# Patient Record
Sex: Female | Born: 1965 | Race: Black or African American | Hispanic: No | Marital: Single | Smoking: Never smoker
Health system: Southern US, Community
[De-identification: ages and names within clinical notes are randomized; demographics above are authoritative.]

## PROBLEM LIST (undated history)

## (undated) ENCOUNTER — Inpatient Hospital Stay (HOSPITAL_COMMUNITY): Payer: 59

## (undated) DIAGNOSIS — D649 Anemia, unspecified: Secondary | ICD-10-CM

## (undated) DIAGNOSIS — R519 Headache, unspecified: Secondary | ICD-10-CM

## (undated) DIAGNOSIS — F419 Anxiety disorder, unspecified: Secondary | ICD-10-CM

## (undated) DIAGNOSIS — K219 Gastro-esophageal reflux disease without esophagitis: Secondary | ICD-10-CM

## (undated) DIAGNOSIS — F32A Depression, unspecified: Secondary | ICD-10-CM

## (undated) DIAGNOSIS — R51 Headache: Secondary | ICD-10-CM

## (undated) DIAGNOSIS — I10 Essential (primary) hypertension: Secondary | ICD-10-CM

## (undated) HISTORY — DX: Essential (primary) hypertension: I10

## (undated) HISTORY — DX: Headache, unspecified: R51.9

## (undated) HISTORY — DX: Headache: R51

## (undated) HISTORY — PX: LAPAROSCOPIC NEPHRECTOMY: SUR781

## (undated) HISTORY — DX: Anxiety disorder, unspecified: F41.9

## (undated) HISTORY — DX: Anemia, unspecified: D64.9

## (undated) HISTORY — DX: Depression, unspecified: F32.A

## (undated) HISTORY — PX: ABDOMINAL HYSTERECTOMY: SHX81

---

## 1998-04-05 ENCOUNTER — Ambulatory Visit (HOSPITAL_COMMUNITY): Admission: RE | Admit: 1998-04-05 | Discharge: 1998-04-05 | Payer: Self-pay | Admitting: General Surgery

## 1998-08-28 ENCOUNTER — Ambulatory Visit (HOSPITAL_COMMUNITY): Admission: RE | Admit: 1998-08-28 | Discharge: 1998-08-28 | Payer: Self-pay | Admitting: General Surgery

## 2002-06-21 ENCOUNTER — Encounter (INDEPENDENT_AMBULATORY_CARE_PROVIDER_SITE_OTHER): Payer: Self-pay | Admitting: Specialist

## 2002-06-21 ENCOUNTER — Ambulatory Visit (HOSPITAL_BASED_OUTPATIENT_CLINIC_OR_DEPARTMENT_OTHER): Admission: RE | Admit: 2002-06-21 | Discharge: 2002-06-21 | Payer: Self-pay | Admitting: Plastic Surgery

## 2003-11-19 HISTORY — PX: BREAST REDUCTION SURGERY: SHX8

## 2005-11-18 HISTORY — PX: GASTRIC BYPASS: SHX52

## 2006-09-23 ENCOUNTER — Ambulatory Visit: Payer: Self-pay | Admitting: Family Medicine

## 2006-09-23 ENCOUNTER — Other Ambulatory Visit: Admission: RE | Admit: 2006-09-23 | Discharge: 2006-09-23 | Payer: Self-pay | Admitting: Family Medicine

## 2006-09-23 ENCOUNTER — Encounter (INDEPENDENT_AMBULATORY_CARE_PROVIDER_SITE_OTHER): Payer: Self-pay | Admitting: *Deleted

## 2007-01-22 ENCOUNTER — Ambulatory Visit: Payer: Self-pay | Admitting: Family Medicine

## 2007-01-23 ENCOUNTER — Encounter: Payer: Self-pay | Admitting: Family Medicine

## 2007-01-23 LAB — CONVERTED CEMR LAB: Candida species: NEGATIVE

## 2007-02-23 ENCOUNTER — Ambulatory Visit: Payer: Self-pay | Admitting: Family Medicine

## 2007-04-20 ENCOUNTER — Ambulatory Visit: Payer: Self-pay | Admitting: Family Medicine

## 2007-06-01 ENCOUNTER — Ambulatory Visit: Payer: Self-pay | Admitting: Family Medicine

## 2007-11-17 ENCOUNTER — Encounter: Payer: Self-pay | Admitting: Family Medicine

## 2007-11-17 DIAGNOSIS — I10 Essential (primary) hypertension: Secondary | ICD-10-CM | POA: Insufficient documentation

## 2007-11-17 DIAGNOSIS — E049 Nontoxic goiter, unspecified: Secondary | ICD-10-CM | POA: Insufficient documentation

## 2008-05-11 ENCOUNTER — Ambulatory Visit: Payer: Self-pay | Admitting: Family Medicine

## 2008-08-18 ENCOUNTER — Emergency Department (HOSPITAL_COMMUNITY): Admission: EM | Admit: 2008-08-18 | Discharge: 2008-08-19 | Payer: Self-pay | Admitting: Emergency Medicine

## 2008-12-28 ENCOUNTER — Ambulatory Visit: Payer: Self-pay | Admitting: Family Medicine

## 2008-12-28 ENCOUNTER — Encounter: Payer: Self-pay | Admitting: Family Medicine

## 2008-12-28 ENCOUNTER — Other Ambulatory Visit: Admission: RE | Admit: 2008-12-28 | Discharge: 2008-12-28 | Payer: Self-pay | Admitting: Family Medicine

## 2008-12-28 DIAGNOSIS — G4700 Insomnia, unspecified: Secondary | ICD-10-CM

## 2008-12-29 ENCOUNTER — Encounter: Payer: Self-pay | Admitting: Family Medicine

## 2008-12-29 LAB — CONVERTED CEMR LAB
BUN: 7 mg/dL (ref 6–23)
Basophils Relative: 0 % (ref 0–1)
Chloride: 103 meq/L (ref 96–112)
Creatinine, Ser: 0.67 mg/dL (ref 0.40–1.20)
Eosinophils Absolute: 0.1 10*3/uL (ref 0.0–0.7)
Eosinophils Relative: 1 % (ref 0–5)
GC Probe Amp, Genital: NEGATIVE
HCT: 34.5 % — ABNORMAL LOW (ref 36.0–46.0)
Hemoglobin: 10.1 g/dL — ABNORMAL LOW (ref 12.0–15.0)
LDL Cholesterol: 99 mg/dL (ref 0–99)
MCHC: 29.3 g/dL — ABNORMAL LOW (ref 30.0–36.0)
MCV: 85.6 fL (ref 78.0–100.0)
Monocytes Absolute: 0.4 10*3/uL (ref 0.1–1.0)
Monocytes Relative: 7 % (ref 3–12)
RBC: 4.03 M/uL (ref 3.87–5.11)
Triglycerides: 48 mg/dL (ref <150)

## 2009-01-02 LAB — CONVERTED CEMR LAB: Candida species: NEGATIVE

## 2009-01-04 LAB — CONVERTED CEMR LAB: Retic Ct Pct: 0.9 % (ref 0.4–3.1)

## 2009-01-09 ENCOUNTER — Telehealth: Payer: Self-pay | Admitting: Family Medicine

## 2009-01-23 ENCOUNTER — Encounter: Payer: Self-pay | Admitting: Family Medicine

## 2009-02-08 ENCOUNTER — Ambulatory Visit: Payer: Self-pay | Admitting: Family Medicine

## 2009-02-09 DIAGNOSIS — D508 Other iron deficiency anemias: Secondary | ICD-10-CM | POA: Insufficient documentation

## 2009-03-16 ENCOUNTER — Emergency Department (HOSPITAL_BASED_OUTPATIENT_CLINIC_OR_DEPARTMENT_OTHER): Admission: EM | Admit: 2009-03-16 | Discharge: 2009-03-16 | Payer: Self-pay | Admitting: Emergency Medicine

## 2009-03-16 ENCOUNTER — Ambulatory Visit: Payer: Self-pay | Admitting: Family Medicine

## 2009-03-16 DIAGNOSIS — J018 Other acute sinusitis: Secondary | ICD-10-CM

## 2009-03-23 DIAGNOSIS — J029 Acute pharyngitis, unspecified: Secondary | ICD-10-CM | POA: Insufficient documentation

## 2009-03-30 ENCOUNTER — Telehealth: Payer: Self-pay | Admitting: Family Medicine

## 2009-04-05 ENCOUNTER — Encounter: Payer: Self-pay | Admitting: Family Medicine

## 2009-04-05 ENCOUNTER — Telehealth: Payer: Self-pay | Admitting: Family Medicine

## 2009-04-26 ENCOUNTER — Encounter (INDEPENDENT_AMBULATORY_CARE_PROVIDER_SITE_OTHER): Payer: Self-pay | Admitting: Obstetrics and Gynecology

## 2009-04-26 ENCOUNTER — Ambulatory Visit (HOSPITAL_COMMUNITY): Admission: RE | Admit: 2009-04-26 | Discharge: 2009-04-27 | Payer: Self-pay | Admitting: Obstetrics and Gynecology

## 2009-04-27 ENCOUNTER — Encounter (INDEPENDENT_AMBULATORY_CARE_PROVIDER_SITE_OTHER): Payer: Self-pay | Admitting: Obstetrics and Gynecology

## 2009-04-27 ENCOUNTER — Ambulatory Visit: Payer: Self-pay | Admitting: Vascular Surgery

## 2009-05-02 ENCOUNTER — Encounter: Payer: Self-pay | Admitting: Family Medicine

## 2009-07-10 ENCOUNTER — Ambulatory Visit: Payer: Self-pay | Admitting: Family Medicine

## 2009-07-10 DIAGNOSIS — M25569 Pain in unspecified knee: Secondary | ICD-10-CM

## 2009-07-21 ENCOUNTER — Telehealth: Payer: Self-pay | Admitting: Family Medicine

## 2009-08-11 ENCOUNTER — Telehealth: Payer: Self-pay | Admitting: Family Medicine

## 2009-08-18 ENCOUNTER — Telehealth: Payer: Self-pay | Admitting: Family Medicine

## 2009-08-21 ENCOUNTER — Encounter: Payer: Self-pay | Admitting: Family Medicine

## 2009-08-22 ENCOUNTER — Telehealth: Payer: Self-pay | Admitting: Family Medicine

## 2009-08-24 ENCOUNTER — Ambulatory Visit: Payer: Self-pay | Admitting: Family Medicine

## 2009-08-25 ENCOUNTER — Encounter: Payer: Self-pay | Admitting: Family Medicine

## 2009-08-28 ENCOUNTER — Encounter: Payer: Self-pay | Admitting: Family Medicine

## 2009-08-28 DIAGNOSIS — H109 Unspecified conjunctivitis: Secondary | ICD-10-CM | POA: Insufficient documentation

## 2009-08-28 DIAGNOSIS — K3189 Other diseases of stomach and duodenum: Secondary | ICD-10-CM | POA: Insufficient documentation

## 2009-08-28 DIAGNOSIS — R1013 Epigastric pain: Secondary | ICD-10-CM

## 2009-09-04 ENCOUNTER — Ambulatory Visit (HOSPITAL_COMMUNITY): Admission: RE | Admit: 2009-09-04 | Discharge: 2009-09-04 | Payer: Self-pay | Admitting: General Surgery

## 2009-09-07 ENCOUNTER — Telehealth: Payer: Self-pay | Admitting: Family Medicine

## 2009-09-13 ENCOUNTER — Ambulatory Visit (HOSPITAL_COMMUNITY): Admission: RE | Admit: 2009-09-13 | Discharge: 2009-09-13 | Payer: Self-pay | Admitting: General Surgery

## 2009-09-13 ENCOUNTER — Encounter: Payer: Self-pay | Admitting: Family Medicine

## 2009-09-13 ENCOUNTER — Encounter: Admission: RE | Admit: 2009-09-13 | Discharge: 2009-11-15 | Payer: Self-pay | Admitting: General Surgery

## 2009-09-26 ENCOUNTER — Telehealth: Payer: Self-pay | Admitting: Family Medicine

## 2009-10-20 ENCOUNTER — Encounter: Payer: Self-pay | Admitting: Family Medicine

## 2009-12-14 ENCOUNTER — Encounter: Admission: RE | Admit: 2009-12-14 | Discharge: 2010-03-14 | Payer: Self-pay | Admitting: General Surgery

## 2010-01-15 ENCOUNTER — Inpatient Hospital Stay (HOSPITAL_COMMUNITY): Admission: RE | Admit: 2010-01-15 | Discharge: 2010-01-17 | Payer: Self-pay | Admitting: Surgery

## 2010-01-16 ENCOUNTER — Encounter (INDEPENDENT_AMBULATORY_CARE_PROVIDER_SITE_OTHER): Payer: Self-pay | Admitting: General Surgery

## 2010-01-16 ENCOUNTER — Ambulatory Visit: Payer: Self-pay | Admitting: Surgery

## 2010-02-02 ENCOUNTER — Encounter: Payer: Self-pay | Admitting: Family Medicine

## 2010-03-08 ENCOUNTER — Encounter: Payer: Self-pay | Admitting: Family Medicine

## 2010-03-20 ENCOUNTER — Encounter: Admission: RE | Admit: 2010-03-20 | Discharge: 2010-03-20 | Payer: Self-pay | Admitting: General Surgery

## 2010-04-02 ENCOUNTER — Telehealth: Payer: Self-pay | Admitting: Family Medicine

## 2010-05-03 ENCOUNTER — Ambulatory Visit: Payer: Self-pay | Admitting: Family Medicine

## 2010-05-03 DIAGNOSIS — Z888 Allergy status to other drugs, medicaments and biological substances status: Secondary | ICD-10-CM | POA: Insufficient documentation

## 2010-05-07 ENCOUNTER — Telehealth: Payer: Self-pay | Admitting: Family Medicine

## 2010-06-18 ENCOUNTER — Telehealth: Payer: Self-pay | Admitting: Family Medicine

## 2010-06-19 ENCOUNTER — Encounter: Admission: RE | Admit: 2010-06-19 | Discharge: 2010-08-16 | Payer: Self-pay | Admitting: General Surgery

## 2010-06-20 ENCOUNTER — Encounter (INDEPENDENT_AMBULATORY_CARE_PROVIDER_SITE_OTHER): Payer: Self-pay

## 2010-06-20 ENCOUNTER — Ambulatory Visit: Payer: Self-pay | Admitting: Family Medicine

## 2010-06-24 DIAGNOSIS — F339 Major depressive disorder, recurrent, unspecified: Secondary | ICD-10-CM

## 2010-06-24 DIAGNOSIS — F411 Generalized anxiety disorder: Secondary | ICD-10-CM

## 2010-06-27 ENCOUNTER — Encounter: Payer: Self-pay | Admitting: Family Medicine

## 2010-06-29 ENCOUNTER — Encounter: Payer: Self-pay | Admitting: Family Medicine

## 2010-07-24 ENCOUNTER — Encounter: Payer: Self-pay | Admitting: Family Medicine

## 2010-07-25 ENCOUNTER — Ambulatory Visit: Payer: Self-pay | Admitting: Family Medicine

## 2010-07-25 DIAGNOSIS — R5383 Other fatigue: Secondary | ICD-10-CM

## 2010-07-25 DIAGNOSIS — R002 Palpitations: Secondary | ICD-10-CM | POA: Insufficient documentation

## 2010-07-25 DIAGNOSIS — D649 Anemia, unspecified: Secondary | ICD-10-CM

## 2010-07-25 DIAGNOSIS — R5381 Other malaise: Secondary | ICD-10-CM

## 2010-07-25 DIAGNOSIS — R0600 Dyspnea, unspecified: Secondary | ICD-10-CM | POA: Insufficient documentation

## 2010-07-25 DIAGNOSIS — R0609 Other forms of dyspnea: Secondary | ICD-10-CM | POA: Insufficient documentation

## 2010-07-25 DIAGNOSIS — R0602 Shortness of breath: Secondary | ICD-10-CM

## 2010-07-25 HISTORY — DX: Anemia, unspecified: D64.9

## 2010-07-26 ENCOUNTER — Encounter: Payer: Self-pay | Admitting: Family Medicine

## 2010-07-26 DIAGNOSIS — R946 Abnormal results of thyroid function studies: Secondary | ICD-10-CM

## 2010-07-26 LAB — CONVERTED CEMR LAB
Free T4: 1.1 ng/dL (ref 0.80–1.80)
Hgb A1c MFr Bld: 5.3 % (ref <5.7)
T3, Free: 2.4 pg/mL (ref 2.3–4.2)

## 2010-07-27 ENCOUNTER — Ambulatory Visit: Payer: Self-pay | Admitting: Family Medicine

## 2010-07-27 DIAGNOSIS — E538 Deficiency of other specified B group vitamins: Secondary | ICD-10-CM | POA: Insufficient documentation

## 2010-07-30 ENCOUNTER — Encounter: Payer: Self-pay | Admitting: Family Medicine

## 2010-07-30 ENCOUNTER — Ambulatory Visit (HOSPITAL_COMMUNITY): Admission: RE | Admit: 2010-07-30 | Discharge: 2010-07-30 | Payer: Self-pay | Admitting: Family Medicine

## 2010-08-06 ENCOUNTER — Ambulatory Visit: Payer: Self-pay | Admitting: Oncology

## 2010-08-07 ENCOUNTER — Encounter (HOSPITAL_COMMUNITY)
Admission: RE | Admit: 2010-08-07 | Discharge: 2010-08-17 | Payer: Self-pay | Source: Home / Self Care | Admitting: Oncology

## 2010-08-07 ENCOUNTER — Ambulatory Visit (HOSPITAL_COMMUNITY): Payer: Self-pay | Admitting: Oncology

## 2010-08-07 ENCOUNTER — Encounter: Payer: Self-pay | Admitting: Family Medicine

## 2010-08-09 ENCOUNTER — Encounter (INDEPENDENT_AMBULATORY_CARE_PROVIDER_SITE_OTHER): Payer: Self-pay | Admitting: *Deleted

## 2010-09-12 ENCOUNTER — Encounter (HOSPITAL_COMMUNITY): Admission: RE | Admit: 2010-09-12 | Payer: Self-pay | Admitting: Oncology

## 2010-12-08 ENCOUNTER — Encounter: Payer: Self-pay | Admitting: Family Medicine

## 2010-12-09 ENCOUNTER — Encounter: Payer: Self-pay | Admitting: Family Medicine

## 2010-12-10 ENCOUNTER — Encounter: Payer: Self-pay | Admitting: Family Medicine

## 2010-12-20 NOTE — Letter (Signed)
Summary: Appointment - Missed  [facility]     [place], [place]    Phone:   Fax:      August 09, 2010 MRN: [id number]   [facility] [address] [place], [place]  [address]   Dear Ms. Pacella,  Our records indicate you missed your appointment on       08/09/10                 with Dr.       .             MCDOWELL                       It is very important that we reach you to reschedule this appointment. We look forward to participating in your health care needs. Please contact us at the number listed above at your earliest convenience to reschedule this appointment.     Sincerely,    Glass blower/designer

## 2010-12-20 NOTE — Progress Notes (Signed)
Summary: DR. Johna Sheriff  DR. HOXWORTH   Imported By: Lind Guest 04/03/2010 09:55:25  _____________________________________________________________________  External Attachment:    Type:   Image     Comment:   External Document

## 2010-12-20 NOTE — Miscellaneous (Signed)
  Clinical Lists Changes  Orders: Added new Referral order of Hematology Referral (Hematology) - Signed 

## 2010-12-20 NOTE — Assessment & Plan Note (Signed)
Summary: b12 shot  Nurse Visit   Vital Signs:  Patient profile:   45 year old female Menstrual status:  regular Height:      66 inches Weight:      246.25 pounds BP sitting:   148 / 98  (right arm)  Vitals Entered By: Adella Hare LPN (July 27, 2010 9:23 AM)  Allergies: 1)  ! Toradol  Medication Administration  Injection # 1:    Medication: Vit B12 1000 mcg    Diagnosis: B12 DEFICIENCY (ICD-266.2)    Route: IM    Site: RUOQ gluteus    Exp Date: 5/12    Lot #: [id number]    Mfr: [employer] Regent    Patient tolerated injection without complications    Given by: Adella Hare LPN (July 27, 2010 9:33 AM)  Orders Added: 1)  Vit B12 1000 mcg [J3420] 2)  Admin of Therapeutic Inj  intramuscular or subcutaneous [96372]   Medication Administration  Injection # 1:    Medication: Vit B12 1000 mcg    Diagnosis: B12 DEFICIENCY (ICD-266.2)    Route: IM    Site: RUOQ gluteus    Exp Date: 5/12    Lot #: [id number]    Mfr: American Regent    Patient tolerated injection without complications    Given by: Adella Hare LPN (July 27, 2010 9:33 AM)  Orders Added: 1)  Vit B12 1000 mcg [J3420] 2)  Admin of Therapeutic Inj  intramuscular or subcutaneous [96372] Pt is deficient in vit b and is symptomatic, she will rcive monthly vit B 12 injections

## 2010-12-20 NOTE — Letter (Signed)
Summary: Out of Work  [facility]  [address]   [place], [place] [address]   Phone: [phone]  Fax: [phone]    June 20, 2010   Employee:  VANNAH NADAL    To Whom It May Concern:   For Medical reasons, please excuse the above named employee from work for the following dates:  Start:   06/20/2010    End:   07/02/2010 To return with no restrictions  If you need additional information, please feel free to contact our office.         Sincerely,    Milus Mallick. Lodema Hong, M.D.

## 2010-12-20 NOTE — Letter (Signed)
Summary: [facility]  [facility]   Imported By: Lind Guest 08/22/2010 12:55:02  _____________________________________________________________________  External Attachment:    Type:   Image     Comment:   External Document

## 2010-12-20 NOTE — Progress Notes (Signed)
  Phone Note Other Incoming   Caller: dr Emilliano Dilworth Summary of Call: pls try and get pt to sched ov with me in the next 2 months for f/u since her surgery Initial call taken by: Syliva Overman MD,  Apr 02, 2010 11:20 PM  Follow-up for Phone Call        APPT. 6.16.2011 @ 3:30 Follow-up by: Lind Guest,  Apr 03, 2010 11:04 AM  Additional Follow-up for Phone Call Additional follow up Details #1::        thanks Additional Follow-up by: Syliva Overman MD,  Apr 03, 2010 4:53 PM

## 2010-12-20 NOTE — Assessment & Plan Note (Signed)
Summary: follow up/slj   Vital Signs:  Patient profile:   45 year old female Menstrual status:  regular Height:      66 inches Weight:      245.25 pounds BMI:     39.73 O2 Sat:      93 % Pulse rate:   74 / minute Pulse rhythm:   regular Resp:     16 per minute BP sitting:   140 / 88  (left arm) Cuff size:   xl   Vitals Entered By: Everitt Amber LPN (July 25, 2010 2:52 PM)  Nutrition Counseling: Patient's BMI is greater than 25 and therefore counseled on weight management options. CC: Follow up chronic problems   CC:  Follow up chronic problems.  History of Present Illness: Pt states that she hasabsolutely no energy in the past 3 weeks , with minimal activity she has to flop down and rest, no chest pain, sometimes palpitations which is new.EVEN AFTER DRESSING SHE HAS TO REST. 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 She was recently seen by the nutrtionist associated with the proigram. She has started seeing a therapist who has her out till the end of this month and who recently started her on zoloft. She denies any recent fever or chills, and as no localised pain  Current Medications (verified): 1)  Maxzide 75-50 Mg Tabs (Triamterene-Hctz) .... One Tab By Mouth Qd 2)  Amlodipine Besylate 10 Mg Tabs (Amlodipine Besylate) .... Take 1 Tablet By Mouth Once A Day 3)  Omeprazole 20 Mg Cpdr (Omeprazole) .... Take 1 Capsule By Mouth Once A Day 4)  Cyclobenzaprine Hcl 10 Mg Tabs (Cyclobenzaprine Hcl) .... Take 1 Tab By Mouth At Bedtime  As Needed 5)  Hydroxyzine Hcl 25 Mg Tabs (Hydroxyzine Hcl) .... Take 1 Tablet By Mouth Three Times A Day As Needed 6)  Zolpidem Tartrate 10 Mg Tabs (Zolpidem Tartrate) .... Take 1 Tab By Mouth At Bedtime  Allergies (verified): 1)  ! Toradol  Review of Systems      See HPI General:  Complains of fatigue,  malaise, weakness, and weight loss. Eyes:  Denies blurring and discharge. ENT:  Denies hoarseness, nasal congestion, and sinus pressure. CV:  Complains of fatigue, lightheadness, palpitations, and shortness of breath with exertion; denies chest pain or discomfort and difficulty breathing while lying down. Resp:  Complains of shortness of breath; denies cough and sputum productive. GI:  Denies abdominal pain, constipation, diarrhea, nausea, and vomiting. GU:  Denies dysuria and urinary frequency. MS:  Complains of muscle weakness; denies joint pain and stiffness. Derm:  Denies itching, lesion(s), and rash. Neuro:  Denies headaches, seizures, and sensation of room spinning. Psych:  Complains of anxiety and depression; denies mental problems, suicidal thoughts/plans, thoughts of violence, and unusual visions or sounds; improved, receiving therapy and on meds, currently out of work. Endo:  Complains of cold intolerance and weight change. Heme:  Denies abnormal bruising and bleeding. Allergy:  Complains of seasonal allergies; denies hives or rash and itching eyes.  Physical Exam  General:  Well-developed,obese,in no acute distress; pt appears fatigued,appropriate and cooperative throughout examination HEENT: No facial asymmetry,  EOMI, No sinus tenderness, TM's Clear, oropharynx  pink and moist. Goiter  Chest: Clear to auscultation bilaterally.  CVS: S1, S2, No murmurs, No S3.   Abd: Soft, Nontender.  MS:  Adequate ROM spine, hips, shoulders and knees.  Ext: No edema.   CNS: CN 2-12 intact, power tone and sensation normal throughout.   Skin: Intact, no visible lesions or rashes.  Psych: Good eye contact, flatl affect.  Memory intact, not anxious or depressed appearing.    Impression & Recommendations:  Problem # 1:  GOITER, UNSPECIFIED (ICD-240.9) Assessment Comment Only  Orders: Radiology Referral (Radiology)  Problem # 2:  ANEMIA (ICD-285.9) Assessment: Comment Only  Orders: T-  * Misc. Laboratory test [phone]), pt likely deficient in vit B will test level  Problem # 3:  DYSPNEA (ICD-786.05) Assessment: Deteriorated  Orders: Radiology Referral (Radiology) EKG w/ Interpretation (93000), though not the most likely, pt experiencoing significant fatigue and is in bed alot of the dayin the past 3 weeks in particular, need to r/o pE  Problem # 4:  PALPITATIONS (ICD-785.1) Assessment: Deteriorated  Orders: Cardiology Referral (Cardiology) EKG w/ Interpretation (93000), multiple symptoms which could be cardiac, nSR with pAC's , no ischemia, pt to have further card eval  Problem # 5:  DEPRESSION (ICD-311) Assessment: Improved  The following medications were removed from the medication list:    Hydroxyzine Hcl 25 Mg Tabs (Hydroxyzine hcl) .Marland Kitchen... Take 1 tablet by mouth three times a day as needed currently seeing a therappist as well as on zoloft  Problem # 6:  OBESITY (ICD-278.00) Assessment: Improved  Ht: 66 (07/25/2010)   Wt: 245.25 (07/25/2010)   BMI: 39.73 (07/25/2010), pt may be losing weight too quickly and this may be contributing to her symptoms, will contact her surgeon  Problem # 7:  HYPERTENSION (ICD-401.9) Assessment: Deteriorated  Her updated medication list for this problem includes:    Maxzide 75-50 Mg Tabs (Triamterene-hctz) ..... One tab by mouth qd    Amlodipine Besylate 10 Mg Tabs (Amlodipine besylate) .Marland Kitchen... Take 1 tablet by mouth once a day    Clonidine Hcl 0.1 Mg Tabs (Clonidine hcl) .Marland Kitchen... Take 1 tab by mouth at bedtime  Orders: T-Basic Metabolic Panel (60454-09811)  BP today: 140/88 Prior BP: 104/80 (06/20/2010)  Labs Reviewed: K+: 3.9 (12/28/2008) Creat: : 0.67 (12/28/2008)   Chol: 165 (12/28/2008)   HDL: 56 (12/28/2008)   LDL: 99 (12/28/2008)   TG: 48 (12/28/2008)  Complete Medication List: 1)  Maxzide 75-50 Mg Tabs (Triamterene-hctz) .... One tab by mouth qd 2)  Amlodipine Besylate 10 Mg Tabs (Amlodipine besylate) .... Take 1  tablet by mouth once a day 3)  Omeprazole 20 Mg Cpdr (Omeprazole) .... Take 1 capsule by mouth once a day 4)  Clonidine Hcl 0.1 Mg Tabs (Clonidine hcl) .... Take 1 tab by mouth at bedtime  Other Orders: Influenza Vaccine NON MCR (91478) T-CBC w/Diff (29562-13086) T-TSH (57846-96295) Tdap => 40yrs IM (28413) Admin 1st Vaccine (24401) Admin 1st Vaccine (State) [phone])  Patient Instructions: 1)  f/u IN 6 WEEKS. 2)  BMP prior to visit, ICD-9: 3)  TSH prior to visit, ICD-9:   TODAY... FATIGUE 4)  CBC w/ Diff prior to visit, ICD-9:, AND ANEMIA PANEL PANEL 5)  yOU WILL BE REFERRED FOR A CHEST CT SCAN TO EVAL FATIGUE, ALSO TO THE CARDIOLOGIST 6)  Additional bp med to be started tonight, and pls stop zolpidem Prescriptions: CLONIDINE HCL 0.1 MG TABS (CLONIDINE HCL) Take 1 tab by mouth at bedtime  #90 x 0   Entered and Authorized by:   Syliva Overman MD   Signed by:   Syliva Overman MD on 07/25/2010   Method used:   Electronically to  [facility] (retail)       [address]       [place], [place]  [address]       Ph: [phone]       Fax: [phone]   RxID:   [id number]    Tetanus/Td Vaccine (to be given today)  Influenza Vaccine (to be given today)

## 2010-12-20 NOTE — Letter (Signed)
Summary: [facility] paper  [facility] paper   Imported By: Lind Guest 11/23/2010 14:52:26  _____________________________________________________________________  External Attachment:    Type:   Image     Comment:   External Document

## 2010-12-20 NOTE — Assessment & Plan Note (Signed)
Summary: sick doesn't feel well at all/slj   Vital Signs:  Patient profile:   45 year old female Menstrual status:  regular Height:      66 inches Weight:      257 pounds BMI:     41.63 O2 Sat:      95 % Pulse rate:   61 / minute Pulse rhythm:   regular Resp:     16 per minute BP sitting:   104 / 80  (left arm) Cuff size:   xl  Vitals Entered By: Everitt Amber LPN (June 20, 2010 1:33 PM)  Nutrition Counseling: Patient's BMI is greater than 25 and therefore counseled on weight management options. CC: has been having dizziness and nausea since yesterday    CC:  has been having dizziness and nausea since yesterday .  History of Present Illness: Pt reports excessive stress for the opast approx 5 weeks. States her job is on the line, she has to work and does not know how she will managwe if laid off. Her sleep ios disturbed, she is overwhelmed, unable to concentrate and focus, crying all the time. She feels depressed, and is interested in getting counselling through her job. Statesd she is absolutely unable to wor at this time and needs some time off.   Current Medications (verified): 1)  Maxzide 75-50 Mg Tabs (Triamterene-Hctz) .... One Tab By Mouth Qd 2)  Zolpidem Tartrate 10 Mg Tabs (Zolpidem Tartrate) .... One Tab By Mouth Qhs 3)  Amlodipine Besylate 10 Mg Tabs (Amlodipine Besylate) .... Take 1 Tablet By Mouth Once A Day 4)  Omeprazole 20 Mg Cpdr (Omeprazole) .... Take 1 Capsule By Mouth Once A Day 5)  Cyclobenzaprine Hcl 10 Mg Tabs (Cyclobenzaprine Hcl) .... Take 1 Tab By Mouth At Bedtime  As Needed 6)  Hydroxyzine Hcl 25 Mg Tabs (Hydroxyzine Hcl) .... Take 1 Tablet By Mouth Three Times A Day As Needed  Allergies (verified): 1)  ! Toradol  Social History: Single Never Smoked Alcohol use-no Drug use-no Occupation:[employer] two children, ages 84 and 23  Review of Systems      See HPI General:  Complains of fatigue and sleep disorder; sleeping on avg 3 hrs  per night. Eyes:  Denies blurring and discharge. ENT:  Denies earache, hoarseness, sinus pressure, and sore throat. CV:  Denies chest pain or discomfort, palpitations, and swelling of feet. Resp:  Denies cough and sputum productive. GI:  Complains of nausea; denies abdominal pain, constipation, diarrhea, and vomiting. GU:  Denies dysuria and urinary frequency. MS:  Denies joint pain and stiffness. Derm:  Denies itching and rash. Neuro:  Complains of headaches and sensation of room spinning; denies seizures; 2 day history. Psych:  Complains of anxiety, depression, easily tearful, and mental problems; denies suicidal thoughts/plans, thoughts of violence, and unusual visions or sounds. Endo:  Denies excessive thirst and excessive urination. Heme:  Denies abnormal bruising and bleeding. Allergy:  Complains of seasonal allergies.  Physical Exam  General:  Well-developed,obese,in no acute distress; alert,appropriate and cooperative throughout examination HEENT: No facial asymmetry,  EOMI, No sinus tenderness, TM's Clear, oropharynx  pink and moist.   Chest: Clear to auscultation bilaterally.  CVS: S1, S2, No murmurs, No S3.   Abd: Soft, Nontender.  MS: Adequate ROM spine, hips, shoulders and knees.  Ext: No edema.   CNS: CN 2-12 intact, power tone and sensation normal throughout.   Skin: Intact, no visible lesions or rashes.  Psych: Good eye contact, normal affect.  Memory intact,both anxious and depressed appearing.    Impression & Recommendations:  Problem # 1:  HYPERTENSION (ICD-401.9) Assessment Unchanged  Her updated medication list for this problem includes:    Maxzide 75-50 Mg Tabs (Triamterene-hctz) ..... One tab by mouth qd    Amlodipine Besylate 10 Mg Tabs (Amlodipine besylate) .Marland Kitchen... Take 1 tablet by mouth once a day  BP today: 104/80 Prior BP: 130/84 (05/03/2010)  Labs Reviewed: K+: 3.9 (12/28/2008) Creat: : 0.67 (12/28/2008)   Chol: 165 (12/28/2008)   HDL: 56  (12/28/2008)   LDL: 99 (12/28/2008)   TG: 48 (12/28/2008)  Problem # 2:  OBESITY (ICD-278.00) Assessment: Unchanged  Ht: 66 (06/20/2010)   Wt: 257 (06/20/2010)   BMI: 41.63 (06/20/2010)  Problem # 3:  DEPRESSION (ICD-311) Assessment: New  Her updated medication list for this problem includes:    Hydroxyzine Hcl 25 Mg Tabs (Hydroxyzine hcl) .Marland Kitchen... Take 1 tablet by mouth three times a day as needed, ;pt to seek counselling through her job  Problem # 4:  ANXIETY STATE, UNSPECIFIED (ICD-300.00) Assessment: Deteriorated  Her updated medication list for this problem includes:    Hydroxyzine Hcl 25 Mg Tabs (Hydroxyzine hcl) .Marland Kitchen... Take 1 tablet by mouth three times a day as needed  Complete Medication List: 1)  Maxzide 75-50 Mg Tabs (Triamterene-hctz) .... One tab by mouth qd 2)  Amlodipine Besylate 10 Mg Tabs (Amlodipine besylate) .... Take 1 tablet by mouth once a day 3)  Omeprazole 20 Mg Cpdr (Omeprazole) .... Take 1 capsule by mouth once a day 4)  Cyclobenzaprine Hcl 10 Mg Tabs (Cyclobenzaprine hcl) .... Take 1 tab by mouth at bedtime  as needed 5)  Hydroxyzine Hcl 25 Mg Tabs (Hydroxyzine hcl) .... Take 1 tablet by mouth three times a day as needed 6)  Zolpidem Tartrate 10 Mg Tabs (Zolpidem tartrate) .... Take 1 tab by mouth at bedtime  Patient Instructions: 1)  Please schedule a follow-up appointment in 1 month. 2)  It is important that you exercise regularly at least 20 minutes 5 times a week. If you develop chest pain, have severe difficulty breathing, or feel very tired , stop exercising immediately and seek medical attention. 3)  You need to lose weight. Consider a lower calorie diet and regular exercise.  4)  you will be started on medication for sleep, and you definitely need to get counselling for stress management 5)  Work excuse to reutrn 07/02/2010 6)  you need  to speak with a counsellor to gert helpwith stress management ,pls call today for anappy Prescriptions: AMLODIPINE  BESYLATE 10 MG TABS (AMLODIPINE BESYLATE) Take 1 tablet by mouth once a day  #30 x 3   Entered by:   Everitt Amber LPN   Authorized by:   Syliva Overman MD   Signed by:   Everitt Amber LPN on 60/45/4098   Method used:   Electronically to        [facility] [id number]* (retail)       [address]       [place], [place]  [address]       Ph: [phone] or [phone]       Fax: [phone]   RxID:   [id number] ZOLPIDEM TARTRATE 10 MG TABS (ZOLPIDEM TARTRATE) Take 1 tab by mouth at bedtime  #30 x 2   Entered by:   Everitt Amber LPN   Authorized by:   Syliva Overman MD   Signed by:  Brandi Hudy LPN on 16/08/9603   Method used:   Printed then faxed to ...       [facility] (retail)       [address]       [place], [place]  [address]       Ph: [phone]       Fax: [phone]   RxID:   [id number] AMLODIPINE BESYLATE 10 MG TABS (AMLODIPINE BESYLATE) Take 1 tablet by mouth once a day  #30 x 3   Entered by:   Everitt Amber LPN   Authorized by:   Syliva Overman MD   Signed by:   Everitt Amber LPN on 09/14/2535   Method used:   Electronically to        [facility] (retail)       [address]       [place], [place]  [address]       Ph: [phone]       Fax: [phone]   RxID:   [id number] MAXZIDE 75-50 MG TABS (TRIAMTERENE-HCTZ) one tab by mouth qd  #30 Each x 3   Entered by:   Everitt Amber LPN   Authorized by:   Syliva Overman MD   Signed by:   Everitt Amber LPN on 32/35/5732   Method used:   Electronically to        [facility] (retail)       [address]       [place], [place]  [address]       Ph: [phone]       Fax: [phone]   RxID:   [id number] ZOLPIDEM TARTRATE 10 MG TABS (ZOLPIDEM TARTRATE) Take 1 tab by mouth at bedtime  #30 x 2   Entered and Authorized by:   Syliva Overman MD   Signed by:   Syliva Overman MD on 06/20/2010   Method used:    Printed then faxed to ...       [facility] [address] (retail)       [id number]       [place], [place]  [address]       Ph: [phone]       Fax: [phone]   RxID:   [id number]

## 2010-12-20 NOTE — Letter (Signed)
Summary: [employer] paper  [employer] paper   Imported By: Lind Guest 06/29/2010 16:05:56  _____________________________________________________________________  External Attachment:    Type:   Image     Comment:   External Document

## 2010-12-20 NOTE — Letter (Signed)
Summary: [employer]  [employer]   Imported By: Lind Guest 06/27/2010 10:55:18  _____________________________________________________________________  External Attachment:    Type:   Image     Comment:   External Document

## 2010-12-20 NOTE — Miscellaneous (Signed)
  Clinical Lists Changes  Medications: Added new medication of FOLIC ACID 1 MG TABS (FOLIC ACID) Take 1 tablet by mouth once a day - Signed Rx of FOLIC ACID 1 MG TABS (FOLIC ACID) Take 1 tablet by mouth once a day;  #30 x 3;  Signed;  Entered by: Syliva Overman MD;  Authorized by: Syliva Overman MD;  Method used: Electronically to [facility] [address], [place], [place], [place]  [address], Ph: [phone], Fax: [phone]    Prescriptions: FOLIC ACID 1 MG TABS (FOLIC ACID) Take 1 tablet by mouth once a day  #30 x 3   Entered and Authorized by:   Syliva Overman MD   Signed by:   Syliva Overman MD on 07/26/2010   Method used:   Electronically to        [facility] (retail)       [address]       [place], [place]  [address]       Ph: [phone]       Fax: [phone]   RxID:   [id number]

## 2010-12-20 NOTE — Progress Notes (Signed)
Summary: refill  Phone Note Call from Patient   Summary of Call: needs a refill maxiade blood pressure med. [phone] ext [phone] Initial call taken by: Rudene Anda,  June 18, 2010 8:42 AM  Follow-up for Phone Call        Rx Called In Follow-up by: Adella Hare LPN,  June 18, 2010 11:27 AM    Prescriptions: MAXZIDE 75-50 MG TABS (TRIAMTERENE-HCTZ) one tab by mouth qd  #30 x 3   Entered by:   Adella Hare LPN   Authorized by:   Syliva Overman MD   Signed by:   Adella Hare LPN on 47/82/9562   Method used:   Electronically to        [facility] [address] (retail)       [phone]       [place], [place]  [address]       Ph: [phone]       Fax: [phone]   RxID:   [id number]

## 2010-12-20 NOTE — Letter (Signed)
Summary: Letter  Letter   Imported By: Lind Guest 07/31/2010 09:29:25  _____________________________________________________________________  External Attachment:    Type:   Image     Comment:   External Document

## 2010-12-20 NOTE — Letter (Signed)
Summary: Letter  Letter   Imported By: Lind Guest 07/31/2010 09:29:44  _____________________________________________________________________  External Attachment:    Type:   Image     Comment:   External Document

## 2010-12-20 NOTE — Progress Notes (Signed)
Summary: dr. Johna Sheriff  dr. Johna Sheriff   Imported By: Lind Guest 02/23/2010 16:15:20  _____________________________________________________________________  External Attachment:    Type:   Image     Comment:   External Document

## 2010-12-20 NOTE — Assessment & Plan Note (Signed)
  pt was not evaluated in the office, no bill generated

## 2010-12-20 NOTE — Assessment & Plan Note (Signed)
Summary: ov   Vital Signs:  Patient profile:   45 year old female Menstrual status:  regular Height:      66 inches Weight:      260 pounds BMI:     42.12 O2 Sat:      97 % Pulse rate:   70 / minute Pulse rhythm:   regular Resp:     16 per minute BP sitting:   130 / 84  (left arm) Cuff size:   xl  Vitals Entered By: Everitt Amber LPN (May 03, 2010 3:53 PM)  Nutrition Counseling: Patient's BMI is greater than 25 and therefore counseled on weight management options. CC: has been having some dizziness and her left knee has been bothering her   CC:  has been having some dizziness and her left knee has been bothering her.  History of Present Illness: Pt is in for f/u. She ahad gastric bypass 4 months agio and is 60 pounds lighter and feeling better. She is foillowing her diet carefully , and intends also to be faithful to the exercise program. Unfortunately , she is feeling the effects of inc physical activity with knee pain,a nd is requesting meds for this. She deneis any recent fever or chills. Shehas some nasal congestion, post nasal drainage and some vertigo, which is more positional  Current Medications (verified): 1)  Maxzide 75-50 Mg Tabs (Triamterene-Hctz) .... One Tab By Mouth Qd 2)  Zolpidem Tartrate 10 Mg Tabs (Zolpidem Tartrate) .... One Tab By Mouth Qhs 3)  Amlodipine Besylate 10 Mg Tabs (Amlodipine Besylate) .... Take 1 Tablet By Mouth Once A Day 4)  Omeprazole 20 Mg Cpdr (Omeprazole) .... Take 1 Capsule By Mouth Once A Day  Allergies (verified): 1)  ! Toradol  Past History:  Past Surgical History: Breast Reduction-2003 Tubal ligation-2006 Partial hysterectomy 04/26/2009  for precancerous cells  Dr. Cherly Hensen Gastric bypass Feb 2011  Review of Systems      See HPI General:  Complains of weight loss; denies chills and fever. Eyes:  Denies discharge and red eye. GI:  Complains of loss of appetite; denies abdominal pain, change in bowel habits, constipation,  diarrhea, nausea, and vomiting; markedly reduced stomach capacity xcauses early satiety. GU:  Denies genital sores and urinary frequency. MS:  Complains of joint pain and stiffness; bilateral knee pain and tenderness left worse than right . Neuro:  Complains of sensation of room spinning; intermittent vertigo, more with change inposition. Psych:  Denies anxiety and easily angered. Endo:  Denies cold intolerance, excessive hunger, excessive thirst, excessive urination, heat intolerance, polyuria, and weight change. Heme:  Denies abnormal bruising and bleeding. Allergy:  Complains of seasonal allergies; denies hives or rash and itching eyes.  Physical Exam  General:  Well-developedobese,in no acute distress; alert,appropriate and cooperative throughout examination HEENT: No facial asymmetry,  EOMI, No sinus tenderness, TM's Clear, oropharynx  pink and moist.   Chest: Clear to auscultation bilaterally.  CVS: S1, S2, No murmurs, No S3.   Abd: Soft, Nontender.  MS: Adequate ROM spine, hips, shoulders and markedly decreased i the knees.  Ext: No edema.   CNS: CN 2-12 intact, power tone and sensation normal throughout.   Skin: Intact, no visible lesions or rashes.  Psych: Good eye contact, normal affect.  Memory intact, not anxious or depressed appearing.    Impression & Recommendations:  Problem # 1:  OTHER DRUG ALLERGY (ZOX-096.04) Assessment Comment Only  Orders: Benadryl  IM or IV (J1200) Admin of Therapeutic Inj  intramuscular  or subcutaneous [phone]), toradol administered in the office , despite a record in the chart that pt develops itching with the drug. Benadryl 25mg  im was administered within 15 minutes of the toradol, at no time did the pt c/o itching, nor did she develop a rash or difficulty breathing. A friend collected her from the office. the pt was made aware that the toradol had been given in error  Problem # 2:  KNEE PAIN, BILATERAL (ICD-719.46) Assessment:  Deteriorated  The following medications were removed from the medication list:    Tylenol Arthritis Pain 650 Mg Cr-tabs (Acetaminophen) .Marland Kitchen... Take 1 tablet by mouth three times a day as needed    Tramadol Hcl 50 Mg Tabs (Tramadol hcl) .Marland Kitchen... 1to 2 tablets at bedtime as needed Her updated medication list for this problem includes:    Cyclobenzaprine Hcl 10 Mg Tabs (Cyclobenzaprine hcl) .Marland Kitchen... Take 1 tab by mouth at bedtime  as needed  Orders: Depo- Medrol 80mg  (J1040) Ketorolac-Toradol 15mg  (O1751)  Problem # 3:  OBESITY (ICD-278.00) Assessment: Improved  Ht: 66 (05/03/2010)   Wt: 260 (05/03/2010)   BMI: 42.12 (05/03/2010)  Problem # 4:  HYPERTENSION (ICD-401.9) Assessment: Improved  Her updated medication list for this problem includes:    Maxzide 75-50 Mg Tabs (Triamterene-hctz) ..... One tab by mouth qd    Amlodipine Besylate 10 Mg Tabs (Amlodipine besylate) .Marland Kitchen... Take 1 tablet by mouth once a day  BP today: 130/84 Prior BP: 142/80 (08/24/2009)  Labs Reviewed: K+: 3.9 (12/28/2008) Creat: : 0.67 (12/28/2008)   Chol: 165 (12/28/2008)   HDL: 56 (12/28/2008)   LDL: 99 (12/28/2008)   TG: 48 (12/28/2008)  Complete Medication List: 1)  Maxzide 75-50 Mg Tabs (Triamterene-hctz) .... One tab by mouth qd 2)  Zolpidem Tartrate 10 Mg Tabs (Zolpidem tartrate) .... One tab by mouth qhs 3)  Amlodipine Besylate 10 Mg Tabs (Amlodipine besylate) .... Take 1 tablet by mouth once a day 4)  Omeprazole 20 Mg Cpdr (Omeprazole) .... Take 1 capsule by mouth once a day 5)  Cyclobenzaprine Hcl 10 Mg Tabs (Cyclobenzaprine hcl) .... Take 1 tab by mouth at bedtime  as needed 6)  Prednisone (pak) 5 Mg Tabs (Prednisone) .... Use as directed 7)  Hydroxyzine Hcl 25 Mg Tabs (Hydroxyzine hcl) .... Take 1 tablet by mouth three times a day as needed  Other Orders: Future Orders: Radiology Referral (Radiology) ... 05/04/2010  Patient Instructions: 1)  cPE in 3.5 months. 2)  It is important that you exercise  regularly at least 20 minutes 5 times a week. If you develop chest pain, have severe difficulty breathing, or feel very tired , stop exercising immediately and seek medical attention. 3)  You need to lose weight. Consider a lower calorie diet and regular exercise. 4)  injections and med today for your knees, call if no better for referral to ortho  Prescriptions: HYDROXYZINE HCL 25 MG TABS (HYDROXYZINE HCL) Take 1 tablet by mouth three times a day as needed  #30 x 0   Entered and Authorized by:   Syliva Overman MD   Signed by:   Syliva Overman MD on 05/03/2010   Method used:   Electronically to        [facility] [address] (retail)       [phone]       [place], [place]  [address]       Ph: [phone]       Fax:  [phone]   RxID:   [id number] PREDNISONE (PAK) 5 MG TABS (PREDNISONE) Use as directed  #21 x 0   Entered and Authorized by:   Syliva Overman MD   Signed by:   Syliva Overman MD on 05/03/2010   Method used:   Electronically to        [facility] [address] (retail)       [phone]       [place], [place]  [address]       Ph: [phone]       Fax: [phone]   RxID:   [id number] CYCLOBENZAPRINE HCL 10 MG TABS (CYCLOBENZAPRINE HCL) Take 1 tab by mouth at bedtime  as needed  #30 x 2   Entered and Authorized by:   Syliva Overman MD   Signed by:   Syliva Overman MD on 05/03/2010   Method used:   Electronically to        [facility] [address] (retail)       [phone]       [place], [place]  [address]       Ph: [phone]       Fax: [phone]   RxID:   [id number]    Medication Administration  Injection # 1:    Medication: Benadryl  IM or IV    Diagnosis: OTHER DRUG ALLERGY (ICD-995.27)    Route: IM    Site: LUOQ gluteus    Exp Date: 6/12    Lot #: [id number]    Mfr: baxter    Comments: 25mg  given    Patient tolerated  injection without complications    Given by: Adella Hare LPN (May 03, 2010 5:21 PM)  Injection # 3:    Medication: Depo- Medrol 80mg     Diagnosis: KNEE PAIN, BILATERAL (ICD-719.46)    Route: IM    Site: RUOQ gluteus    Exp Date: 10/2010    Lot #: [id number]     Mfr: Pharmacia    Comments: 80mg  given     Patient tolerated injection without complications    Given by: Everitt Amber LPN (May 04, 2010 8:11 AM)  Injection # 4:    Medication: Ketorolac-Toradol 15mg     Diagnosis: KNEE PAIN, BILATERAL (ICD-719.46)    Route: IM    Site: LUOQ gluteus    Exp Date: 12/2011    Lot #: [id number]     Mfr: hospira    Comments: 60mg  given     Patient tolerated injection without complications    Given by: Everitt Amber LPN (May 04, 2010 8:11 AM)  Orders Added: 1)  Est. Patient Level IV [71062] 2)  Benadryl  IM or IV [J1200] 3)  Admin of Therapeutic Inj  intramuscular or subcutaneous [96372] 4)  Depo- Medrol 80mg  [J1040] 5)  Ketorolac-Toradol 15mg  [J1885] 6)  Radiology Referral [Radiology]

## 2010-12-20 NOTE — Progress Notes (Signed)
  Phone Note From Pharmacy   Caller: [facility] [address] Summary of Call: pred pak on back order, can they fill loose tabs? Initial call taken by: Adella Hare LPN,  May 07, 2010 2:29 PM  Follow-up for Phone Call        yes they can fill as loose order, with the same dosing directionsa s the pred 5mg  dose pack Follow-up by: Syliva Overman MD,  May 07, 2010 3:32 PM  Additional Follow-up for Phone Call Additional follow up Details #1::        pharmacy aware Additional Follow-up by: Adella Hare LPN,  May 07, 2010 5:11 PM

## 2011-01-31 LAB — HEMOCCULT GUIAC POC 1CARD (OFFICE): Fecal Occult Bld: NEGATIVE

## 2011-02-06 LAB — COMPREHENSIVE METABOLIC PANEL
ALT: 9 U/L (ref 0–35)
AST: 15 U/L (ref 0–37)
Albumin: 3.3 g/dL — ABNORMAL LOW (ref 3.5–5.2)
CO2: 30 mEq/L (ref 19–32)
Calcium: 8.7 mg/dL (ref 8.4–10.5)
Chloride: 105 mEq/L (ref 96–112)
Creatinine, Ser: 0.69 mg/dL (ref 0.4–1.2)
GFR calc Af Amer: >60 mL/min (ref >60)
GFR calc non Af Amer: >60 mL/min (ref >60)
Sodium: 140 mEq/L (ref 135–145)
Total Bilirubin: 0.8 mg/dL (ref 0.3–1.2)

## 2011-02-06 LAB — DIFFERENTIAL
Basophils Absolute: 0 10*3/uL (ref 0.0–0.1)
Lymphocytes Relative: 31 % (ref 12–46)
Monocytes Absolute: 0.3 10*3/uL (ref 0.1–1.0)
Neutro Abs: 3.3 10*3/uL (ref 1.7–7.7)
Neutrophils Relative %: 61 % (ref 43–77)

## 2011-02-06 LAB — CBC
MCV: 87.4 fL (ref 78.0–100.0)
Platelets: 277 10*3/uL (ref 150–400)
RBC: 4.09 MIL/uL (ref 3.87–5.11)
WBC: 5.5 10*3/uL (ref 4.0–10.5)

## 2011-02-06 LAB — HEMOGLOBIN AND HEMATOCRIT, BLOOD: HCT: 32.4 % — ABNORMAL LOW (ref 36.0–46.0)

## 2011-02-11 LAB — CBC
HCT: 29.9 % — ABNORMAL LOW (ref 36.0–46.0)
Hemoglobin: 9.8 g/dL — ABNORMAL LOW (ref 12.0–15.0)
MCV: 86.4 fL (ref 78.0–100.0)
Platelets: 203 10*3/uL (ref 150–400)
RBC: 3.43 MIL/uL — ABNORMAL LOW (ref 3.87–5.11)
RBC: 3.45 MIL/uL — ABNORMAL LOW (ref 3.87–5.11)
WBC: 8.2 10*3/uL (ref 4.0–10.5)
WBC: 8.9 10*3/uL (ref 4.0–10.5)

## 2011-02-11 LAB — DIFFERENTIAL
Basophils Absolute: 0 10*3/uL (ref 0.0–0.1)
Eosinophils Absolute: 0 10*3/uL (ref 0.0–0.7)
Eosinophils Relative: 1 % (ref 0–5)
Lymphocytes Relative: 22 % (ref 12–46)
Lymphocytes Relative: 26 % (ref 12–46)
Lymphs Abs: 1.8 10*3/uL (ref 0.7–4.0)
Lymphs Abs: 2.3 10*3/uL (ref 0.7–4.0)
Monocytes Absolute: 0.6 10*3/uL (ref 0.1–1.0)
Monocytes Relative: 7 % (ref 3–12)
Monocytes Relative: 7 % (ref 3–12)
Neutro Abs: 5.8 10*3/uL (ref 1.7–7.7)
Neutro Abs: 5.8 10*3/uL (ref 1.7–7.7)
Neutrophils Relative %: 70 % (ref 43–77)

## 2011-02-11 LAB — HEMOGLOBIN AND HEMATOCRIT, BLOOD
HCT: 34 % — ABNORMAL LOW (ref 36.0–46.0)
Hemoglobin: 10.7 g/dL — ABNORMAL LOW (ref 12.0–15.0)

## 2011-02-25 LAB — BASIC METABOLIC PANEL
BUN: 5 mg/dL — ABNORMAL LOW (ref 6–23)
BUN: 6 mg/dL (ref 6–23)
CO2: 27 mEq/L (ref 19–32)
Calcium: 8.4 mg/dL (ref 8.4–10.5)
Chloride: 102 mEq/L (ref 96–112)
Creatinine, Ser: 0.65 mg/dL (ref 0.4–1.2)
Creatinine, Ser: 0.83 mg/dL (ref 0.4–1.2)
GFR calc Af Amer: >60 mL/min (ref >60)
GFR calc non Af Amer: >60 mL/min (ref >60)
Glucose, Bld: 88 mg/dL (ref 70–99)
Potassium: 3.7 mEq/L (ref 3.5–5.1)

## 2011-02-25 LAB — CBC
HCT: 37.4 % (ref 36.0–46.0)
Hemoglobin: 12.6 g/dL (ref 12.0–15.0)
MCHC: 33.8 g/dL (ref 30.0–36.0)
MCV: 88.9 fL (ref 78.0–100.0)
MCV: 89.6 fL (ref 78.0–100.0)
Platelets: 261 10*3/uL (ref 150–400)
Platelets: 277 10*3/uL (ref 150–400)
RBC: 3.24 MIL/uL — ABNORMAL LOW (ref 3.87–5.11)
RDW: 15.1 % (ref 11.5–15.5)

## 2011-04-02 NOTE — Op Note (Signed)
NAME:  BLANKA, ROCKHOLT NO.:  [id number]   MEDICAL RECORD NO.:  [id number]          PATIENT TYPE:  OIB   LOCATION:  9313                          FACILITY:  [facility]   PHYSICIAN:  Maxie Better, M.D.DATE OF BIRTH:  11-30-1965   DATE OF PROCEDURE:  04/26/2009  DATE OF DISCHARGE:                               OPERATIVE REPORT   PREOPERATIVE DIAGNOSES:  1. Menorrhagia.  2. Uterine fibroids.   PROCEDURE:  Laparoscopic-assisted vaginal hysterectomy.   POSTOPERATIVE DIAGNOSES:  1. Menorrhagia.  2. Uterine fibroids.   ANESTHESIA:  General.   SURGEON:  Maxie Better, MD   ASSISTANTS:  1. Sherry A. Rosalio Macadamia, MD  2. Arlana Lindau, NP   DESCRIPTION OF PROCEDURE:  Under adequate general anesthesia, the  patient was placed in the dorsal lithotomy position.  She was sterilely  prepped and draped in usual fashion.  An indwelling Foley catheter was  sterilely placed.  Bivalve speculum was placed in the vagina.  A single-  tooth tenaculum was placed on the anterior lip of the cervix.  An acorn  cannula was introduced into the cervical os and attached to tenaculum  for manipulation of the uterus.  Bivalve speculum was removed.  The  patient was sterilely prepped and draped.  Marcaine 0.25% was injected  infraumbilically.  A vertical infraumbilical incision was then made.  Veress needle was placed, a long Veress needle was inserted and tested  with sterile solution.  The testing was good and the CO2 was then  attached.  At that point, opening pressure 70 was noted.  No flow was  noted.  The patient being obese, the pannus was pulled upward and  jiggled.  No change in the absence of flow noted and therefore Veress  needle was removed, reinserted, and tested again, same testing that felt  to be fine, fluid in and fluid out with bubbles back and again the same.  At that point, Veress needle was removed.  Veress needle was then  inserted again within incision and then  the tested fluid, testing was  then performed.  At that point, it was then noted that some clearish  yellow, slightly yellow fluid was being pulled up.  This was again seen  on aspiration attempts and at that point, it was unclear as to what the  etiology of the fluid was.  So, Veress needle remained, the incision was  extended vertically and an open procedure was then started.  The patient  unfortunately the patient had a deep subcutaneous area and after a while  the fascia was identified.  The peritoneum was identified and opened.  A  normal length Hasson cannula  was not going to traverse this opening and  a long Hasson was placed with 12-inch, with a 150 mm long trocar.  When  the trocar was being pulled out, blood was noted in the lower portion of  the actual trocar.  So, the lighted video laparoscope was placed and  then on insertion of the camera and laparoscope, there was some blood  noted in the pelvis.  Direct inspection below the umbilicus did  not show  any evidence of active bleeding from that site or any holes in the  bowel.  The Veress needle which was still in place could not be seen  because it was parallel to this Hasson trocar.  A 5-mm site was made in  the right lower quadrant and a 5-mm disposable was placed under direct  visualization.  Inspection of the uterus where some of the clots were  which was overlying the right adnexa revealed that there was some  bleeding on the ovary near the hilum.  The 5-mm port had to be replaced  with a longer port due to the inability to use the Nezhat suction  through the 5-mm site in order to aspirate the blood.  A second site was  then placed on the left lower quadrant again after several attempts with  a long disposable trocar with sleeve.  A 5-mm scope was placed in the  lower port on the right in order to again see the Veress needle which  was identified, did not appear to be in any location of necessary  concern.  It appears that  the fluid aspiration was probably from the  regional testing that had been done in the subcutaneous area.  Attention  then turned back to the ovary on the right, as the blood at the  hemoperitoneum was aspirated as much as we could.  The right ovary was  brought up into the field, and the area that was bleeding was  cauterized.  After several cauterization events, bleeding was stopped.  It was then noted that there was a loose Filshie clip in the anterior  cul-de-sac, attempt at removal was unsuccessful.  The Filshie clip on  the left had buried itself around some peritoneum tissue in the left of  the anterior cul-de-sac.  Through the lower ports, the round ligaments  were bilaterally clamped, cauterized, and cut.  The left tube and  ovaries were normal.  The utero-ovarian ligaments bilaterally were  serially clamped, cauterized, and cut.  When the junction of the bladder  flap was reached, that was opened partially on either side with the  opening of the utero-ovarian ligaments and the midportion of the  attachment was cauterized and cut and the bladder was then sharply  dissected down inferiorly.  All of blood was then suctioned from the  posterior aspect of the uterus and uterus was slightly enlarged with  multiple fibroids.  Once this was accomplished superiorly, reinspection  again looked around, decision was then made to go below while the ports  remain.  The instruments in the ports were removed and secured in the  sterile fashion.  Attention was then turned to the vagina.  There was  not much descent from the cervix which was parous.  Dilute solution of  Pitressin of 20 cc in  30 mL of normal saline was injected  circumferentially at the cervicovaginal junction.  A circumferential  incision was then made and carried superiorly.  The posterior cul-de-sac  was then opened and extended.  At that point, the blood from the abdomen  was egressed and suctioned out.  The posterior vaginal  cuff was then  oversewn with 0-Vicryl sutures and the uterosacral was then bilaterally  clamped, cut, and suture ligated with 0 Vicryl suture.  Anteriorly, the  cul-de-sac was opened.  The cardinal ligaments was then serially  clamped, cauterized, and cut as was the uterine vessels bilaterally.  There was bleeding from the uterine vessel on  the right and that was  then isolated and cauterized.  All attachments were then subsequently  clamped, cauterized, and cut until the uterus with cervix was removed.  The posterior vaginal cuff had some bleeding.  Interrupted figure-of-  eight sutures were placed.  The left lateral wall had some bleeding,  figure-of-eight sutures was also placed.  The right lateral wall had  good hemostasis and the vagina  was then closed vertically with  interrupted 0 Vicryl suture and the uterosacral ligaments was tied in  the midline and then oversewn, it was noted that with each suture  placement, there was bleeding from those suture sites, suggestive of  medications that the patient may have already been taking unbeknownst to  myself.  Once hemostasis noted in the vagina, vagina was irrigated, not  packed, and then attention was then turned back to the abdomen.  The  abdomen was reinsufflated and instruments were placed through the ports.  Bleeding was noted in the vaginal cuff area on the left and this was  easily cauterized.  On the right,  it appears to be bleeding from the  bladder pillar on the right and this was carefully cauterized.  Both  pedicles of the ovaries were normal.  Additional blood was removed.  Abdomen was irrigated and suctioned.  There were other clotted material  there was left for reabsorption by the patient.  The abdomen was then  deflated partially in order to see if there was any other active  bleeding. None was seen.  At that point, the lower ports were removed.  The infraumbilical site was taken out.  The parietal peritoneum was   closed separately on the infraumbilical site and the fascia was then  closed as well with 0 Vicryl.  The skin was approximated with  subcuticular 4-0 Vicryl sutures.  Infraumbilical and within the lower  ports and Dermabond placed over an appropriate port in the right.  Specimen was uterus with cervix sent to Pathology.  Estimated blood loss  was 500 mL.  Intraoperative fluid was 2200 mL.  Urine output 1200 mL of clear yellow urine.  Sponge and instrument  counts x2 was correct.  Complication was the puncture of the right  ovary.  The patient tolerated the procedure otherwise well, was  transferred to recovery room in stable condition.      Maxie Better, M.D.  Electronically Signed     [place]/MEDQ  D:  04/26/2009  T:  04/27/2009  Job:  [id number]

## 2011-04-05 NOTE — Op Note (Signed)
NAME:  Olivia Woodward, Olivia Woodward NO.:  [id number]   MEDICAL RECORD NO.:  [id number]                   PATIENT TYPE:   LOCATION:                                       FACILITY:   PHYSICIAN:  Mary A. Contogiannis, M.D.          DATE OF BIRTH:   DATE OF PROCEDURE:  06/21/2002  DATE OF DISCHARGE:                                 OPERATIVE REPORT   PREOPERATIVE DIAGNOSIS:  Bilateral macromastia.   POSTOPERATIVE DIAGNOSIS:  Bilateral macromastia.   OPERATION:  Bilateral reduction mammoplasty.   SURGEON:  Mary A. Contogiannis, M.D.   ASSISTANT:  Coral Ceo, MD   ANESTHESIA:  General endotracheal anesthesia.   ANESTHESIOLOGIST:  Maren Beach, M.D.   ESTIMATED BLOOD LOSS:  250 cc.   FLUIDS REPLACED:  3500 cc crystalloid.   URINE OUTPUT:  500 cc.   COMPLICATIONS:  None.   AMOUNT BREAST TISSUE REMOVED:  Right breast 2536 g; left breast 2324 g.   JUSTIFICATION FOR OVERNIGHT STAY:  Progressive pain control along with  ambulation, monitoring of the nipples and breast flaps.   INDICATIONS FOR PROCEDURE:  The patient is a 45 year old African-American  female who presents with complaints of bilateral macromastia that is  clinically symptomatic.  She complains of neck aches, backaches, headaches,  shoulder strap groove marks and pain along with intertriginous skin rashes.  She requests that we proceed with bilateral reduction mammoplasty.   PROCEDURE:  The patient was marked in the preoperative holding area for the  pattern of Wise for the future bilateral reduction mammoplasty.  She was  then taken to the operating room, placed on the table in the supine  position.  After adequate general endotracheal anesthesia was obtained, the  patient's chest was prepped with Betadine and draped in sterile fashion.  The base of the breasts were then injected with 1% lidocaine with  epinephrine.  After adequate hemostasis had taken effect, the procedure was   begun.   First the right breast reduction was performed.  Her nipple was marked with  a 45-mm nipple marker.  This was then incised and the skin de-epithelialized  around it down to the inframammary crease in an inferior pedicle pattern.  Next the medial, superior, and lateral skin flaps were then elevated down to  the chest wall.  The excess fat and glandular tissue were removed from the  inferior pedicle.  The nipple was then examined and found to be pink and  viable.  The wound was irrigated with saline irrigation.  Meticulous  hemostasis was obtained with the Bovie electrocautery.  The inferior pedicle  was centralized using 3-0 Prolene suture.  A #10 JP flat, fully fluted drain  was then placed into the wound.  The skin flaps brought together in everted  T junction with a 2-0 Prolene suture.  The incisions were then stapled for  temporary closure.   Attention was then turned to the left  breast.  The nipple was marked with 45-  mm nipple marker.  The skin was then de-epithelialized around the nipple  down to the inframammary crease in an inferior pedicle pattern.  Next the  medial, superior, and lateral skin flaps were elevated down to the chest  wall.  The excess fat and glandular tissue removed from the inferior  pedicle.  The nipple was examined and found to be pink and viable.  The  wound was irrigated with saline irrigation.  Meticulous hemostasis was  obtained with the Bovie electrocautery.  The inferior pedicle was  centralized using 3-0 Prolene suture.  A #10 JP flat, fully fluted drain was  then placed into the wound.  The skin flaps brought together in everted T  junction with a 2-0 Prolene suture.  The incision was then stapled for  temporary closure.   The breasts were compared and found to have good shape and symmetry.  All  the staples were then removed and the incisions closed using 3-0 Monocryl  interrupted dermal sutures followed by 4-0 Monocryl in an  intracuticular  stitch on the skin.   The patient was then placed in an upright position.  The preselected  location of the nipple was marked on both breasts using the 42-mm nipple  marker.  The patient was then placed back in the recumbent position.   First the future nipple-areolar complex on the right breast was incised.  This tissue was excised full thickness.  The nipple was examined and found  to be pink and viable and then brought out into this aperture and sewn in  place using 4-0 Monocryl interrupted dermal sutures followed by a 5-0  Monocryl in an intracuticular stitch on the skin.  In the likewise fashion  the future nipple-areolar complex on the left breast was incised.  The  tissue was incised full thickness.  The nipple was examined and found to be  pink and viable and then brought out into this aperture and sewn in place  using 4-0 Monocryl interrupted dermal sutures followed by 5-0 Monocryl  running intracuticular stitch on the skin.  The JP drains were sewn in  placed using 3-0 nylon suture.  Benzoin and Steri-Strips were placed over  the incisions and Bacitracin and Adaptic additionally over the nipples.  The  patient then had 4 x 4s placed over the wounds and she was placed into a  light support bra.  There were no complications.  The patient tolerated the  procedure well.  she was then extubated and taken to the recovery room in  stable condition.  The patient will remain overnight for progressive pain  control along with ambulation and monitoring of the nipples and breast  flaps.  Discharge is planned for the morning. Amounts of breast tissue  removed:  Right breast 2536, left breast 2324 g.                                               Mary A. Contogiannis, M.D.    MAC/MEDQ  D:  06/21/2002  T:  06/24/2002  Job:  [id number]   cc:   Leonette Most A. Clearance Coots, M.D.

## 2012-08-07 ENCOUNTER — Telehealth (INDEPENDENT_AMBULATORY_CARE_PROVIDER_SITE_OTHER): Payer: Self-pay | Admitting: General Surgery

## 2012-08-07 NOTE — Telephone Encounter (Signed)
Called the patient to schedule a follow-up visit and the phone would disconnect when the person would pick the phone up....cef

## 2013-08-31 ENCOUNTER — Ambulatory Visit: Payer: Self-pay | Admitting: Family Medicine

## 2014-01-30 ENCOUNTER — Emergency Department (HOSPITAL_COMMUNITY): Payer: Self-pay

## 2014-01-30 ENCOUNTER — Emergency Department (HOSPITAL_COMMUNITY)
Admission: EM | Admit: 2014-01-30 | Discharge: 2014-01-30 | Disposition: A | Discharge status: Home / Self Care | Payer: Self-pay | Attending: Emergency Medicine | Admitting: Emergency Medicine

## 2014-01-30 ENCOUNTER — Encounter (HOSPITAL_COMMUNITY): Payer: Self-pay | Admitting: Emergency Medicine

## 2014-01-30 DIAGNOSIS — Z79899 Other long term (current) drug therapy: Secondary | ICD-10-CM | POA: Insufficient documentation

## 2014-01-30 DIAGNOSIS — M19019 Primary osteoarthritis, unspecified shoulder: Secondary | ICD-10-CM | POA: Insufficient documentation

## 2014-01-30 DIAGNOSIS — Y93G9 Activity, other involving cooking and grilling: Secondary | ICD-10-CM | POA: Insufficient documentation

## 2014-01-30 DIAGNOSIS — IMO0002 Reserved for concepts with insufficient information to code with codable children: Secondary | ICD-10-CM | POA: Insufficient documentation

## 2014-01-30 DIAGNOSIS — Y929 Unspecified place or not applicable: Secondary | ICD-10-CM | POA: Insufficient documentation

## 2014-01-30 DIAGNOSIS — S46911A Strain of unspecified muscle, fascia and tendon at shoulder and upper arm level, right arm, initial encounter: Secondary | ICD-10-CM

## 2014-01-30 DIAGNOSIS — X500XXA Overexertion from strenuous movement or load, initial encounter: Secondary | ICD-10-CM | POA: Insufficient documentation

## 2014-01-30 DIAGNOSIS — R079 Chest pain, unspecified: Secondary | ICD-10-CM | POA: Insufficient documentation

## 2014-01-30 LAB — BASIC METABOLIC PANEL
BUN: 8 mg/dL (ref 6–23)
CHLORIDE: 103 meq/L (ref 96–112)
CO2: 25 meq/L (ref 19–32)
Calcium: 8.8 mg/dL (ref 8.4–10.5)
Creatinine, Ser: 0.65 mg/dL (ref 0.50–1.10)
GFR calc Af Amer: >90 mL/min (ref >90)
GFR calc non Af Amer: >90 mL/min (ref >90)
GLUCOSE: 88 mg/dL (ref 70–99)
POTASSIUM: 4.5 meq/L (ref 3.7–5.3)
Sodium: 138 mEq/L (ref 137–147)

## 2014-01-30 LAB — CBC WITH DIFFERENTIAL/PLATELET
BASOS PCT: 0 % (ref 0–1)
Basophils Absolute: 0 10*3/uL (ref 0.0–0.1)
EOS ABS: 0.1 10*3/uL (ref 0.0–0.7)
Eosinophils Relative: 2 % (ref 0–5)
HEMATOCRIT: 36.4 % (ref 36.0–46.0)
HEMOGLOBIN: 11.2 g/dL — AB (ref 12.0–15.0)
Lymphocytes Relative: 42 % (ref 12–46)
Lymphs Abs: 2.5 10*3/uL (ref 0.7–4.0)
MCH: 26.3 pg (ref 26.0–34.0)
MCHC: 30.8 g/dL (ref 30.0–36.0)
MCV: 85.4 fL (ref 78.0–100.0)
MONO ABS: 0.5 10*3/uL (ref 0.1–1.0)
MONOS PCT: 8 % (ref 3–12)
Neutro Abs: 2.9 10*3/uL (ref 1.7–7.7)
Neutrophils Relative %: 48 % (ref 43–77)
Platelets: 254 10*3/uL (ref 150–400)
RBC: 4.26 MIL/uL (ref 3.87–5.11)
RDW: 14.9 % (ref 11.5–15.5)
WBC: 6 10*3/uL (ref 4.0–10.5)

## 2014-01-30 LAB — D-DIMER, QUANTITATIVE: D-Dimer, Quant: 0.95 ug/mL-FEU — ABNORMAL HIGH (ref 0.00–0.48)

## 2014-01-30 LAB — TROPONIN I: Troponin I: <0.3 ng/mL (ref <0.30)

## 2014-01-30 MED ORDER — ACETAMINOPHEN-CODEINE #3 300-30 MG PO TABS: 1.0000 | ORAL_TABLET | Freq: Four times a day (QID) | ORAL | Status: DC | PRN

## 2014-01-30 MED ORDER — PROMETHAZINE HCL 25 MG/ML IJ SOLN
12.5000 mg | Freq: Once | INTRAMUSCULAR | Status: AC
  Administered 2014-01-30: 12.5 mg via INTRAVENOUS
  Filled 2014-01-30: qty 1

## 2014-01-30 MED ORDER — SODIUM CHLORIDE 0.9 % IV SOLN
Freq: Once | INTRAVENOUS | Status: AC
  Administered 2014-01-30: 11:00:00 via INTRAVENOUS

## 2014-01-30 MED ORDER — DEXAMETHASONE 4 MG PO TABS: ORAL_TABLET | ORAL | Status: DC

## 2014-01-30 MED ORDER — FENTANYL CITRATE 0.05 MG/ML IJ SOLN
50.0000 ug | Freq: Once | INTRAMUSCULAR | Status: AC
  Administered 2014-01-30: 50 ug via INTRAVENOUS
  Filled 2014-01-30: qty 2

## 2014-01-30 MED ORDER — BACLOFEN 10 MG PO TABS: 10.0000 mg | ORAL_TABLET | Freq: Three times a day (TID) | ORAL | Status: AC

## 2014-01-30 MED ORDER — SODIUM CHLORIDE 0.9 % IJ SOLN
INTRAMUSCULAR | Status: AC
  Filled 2014-01-30: qty 800

## 2014-01-30 MED ORDER — IOHEXOL 350 MG/ML SOLN
100.0000 mL | Freq: Once | INTRAVENOUS | Status: AC | PRN
  Administered 2014-01-30: 100 mL via INTRAVENOUS

## 2014-01-30 NOTE — Discharge Instructions (Signed)
Your x-rays and CT scans are negative for fracture or dislocation. There also negative for any blood clot in your chest and shoulder area. I suspect you have a very bad shoulder strain. Please use the sling for the next 7 days. Please use baclofen and Decadron daily. Use Tylenol codeine for pain if if needed. Baclofen and Tylenol codeine may cause drowsiness, please use with caution. Please see Dr. Moshe Cipro for additional followup and management in the office. Shoulder Sprain A shoulder sprain is the result of damage to the tough, fiber-like tissues (ligaments) that help hold your shoulder in place. The ligaments may be stretched or torn. Besides the main shoulder joint (the ball and socket), there are several smaller joints that connect the bones in this area. A sprain usually involves one of those joints. Most often it is the acromioclavicular (or AC) joint. That is the joint that connects the collarbone (clavicle) and the shoulder blade (scapula) at the top point of the shoulder blade (acromion). A shoulder sprain is a mild form of what is called a shoulder separation. Recovering from a shoulder sprain may take some time. For some, pain lingers for several months. Most people recover without long term problems. CAUSES   A shoulder sprain is usually caused by some kind of trauma. This might be:  Falling on an outstretched arm.  Being hit hard on the shoulder.  Twisting the arm.  Shoulder sprains are more likely to occur in people who:  Play sports.  Have balance or coordination problems. SYMPTOMS   Pain when you move your shoulder.  Limited ability to move the shoulder.  Swelling and tenderness on top of the shoulder.  Redness or warmth in the shoulder.  Bruising.  A change in the shape of the shoulder. DIAGNOSIS  Your healthcare provider may:  Ask about your symptoms.  Ask about recent activity that might have caused those symptoms.  Examine your shoulder. You may be asked to  do simple exercises to test movement. The other shoulder will be examined for comparison.  Order some tests that provide a look inside the body. They can show the extent of the injury. The tests could include:  X-rays.  CT (computed tomography) scan.  MRI (magnetic resonance imaging) scan. RISKS AND COMPLICATIONS  Loss of full shoulder motion.  Ongoing shoulder pain. TREATMENT  How long it takes to recover from a shoulder sprain depends on how severe it was. Treatment options may include:  Rest. You should not use the arm or shoulder until it heals.  Ice. For 2 or 3 days after the injury, put an ice pack on the shoulder up to 4 times a day. It should stay on for 15 to 20 minutes each time. Wrap the ice in a towel so it does not touch your skin.  Over-the-counter medicine to relieve pain.  A sling or brace. This will keep the arm still while the shoulder is healing.  Physical therapy or rehabilitation exercises. These will help you regain strength and motion. Ask your healthcare provider when it is OK to begin these exercises.  Surgery. The need for surgery is rare with a sprained shoulder, but some people may need surgery to keep the joint in place and reduce pain. HOME CARE INSTRUCTIONS   Ask your healthcare provider about what you should and should not do while your shoulder heals.  Make sure you know how to apply ice to the correct area of your shoulder.  Talk with your healthcare provider about  which medications should be used for pain and swelling.  If rehabilitation therapy will be needed, ask your healthcare provider to refer you to a therapist. If it is not recommended, then ask about at-home exercises. Find out when exercise should begin. SEEK MEDICAL CARE IF:  Your pain, swelling, or redness at the joint increases. SEEK IMMEDIATE MEDICAL CARE IF:   You have a fever.  You cannot move your arm or shoulder. Document Released: 03/23/2009 Document Revised: 01/27/2012  Document Reviewed: 03/23/2009 [facility] Patient Information 2014 [place], [place].

## 2014-01-30 NOTE — ED Notes (Signed)
Pt c/o sudden onset right shoulder pain radiates from shoulder blade into left breast while cooking this am. Pt c/o sob. Pain worse with movement and palpation.

## 2014-01-30 NOTE — ED Notes (Signed)
Hobson PA at bedside,  

## 2014-01-30 NOTE — ED Notes (Signed)
Pt verbalized understanding of not to drive and use caution after use of tylenol #3 and baclofen due to meds may cause drowsiness

## 2014-01-30 NOTE — ED Notes (Signed)
H. Bryant, PA at bedside. 

## 2014-01-30 NOTE — ED Notes (Addendum)
Pt states that she was fixing pancakes this am when she started having in right posterior shoulder that radiated over the shoulder area to the front of the shoulder area, pain is worse with palpation and movement of right shoulder, denies any n/v, diaphoresis, admits to sob but states "the pain is so bad it takes my breath away" pt is able to have conversation with family at bedside without any problems,  states that she had the same type of pain 3 months ago when she was lifting laundry detergent from her car. Did not seek medication attention for shoulder pain at that time, states that it went away. Denies any new injury. Positive distal pulse present, good cap refill noted,

## 2014-02-05 NOTE — ED Provider Notes (Signed)
CSN: [id number]     Arrival date & time 01/30/14  1008 History   First MD Initiated Contact with Patient 01/30/14 1016     Chief Complaint  Patient presents with  . Shoulder Pain     (Consider location/radiation/quality/duration/timing/severity/associated sxs/prior Treatment) Patient is a 48 y.o. female presenting with shoulder pain. The history is provided by the patient.  Shoulder Pain This is a new problem. The current episode started today. The problem occurs constantly. The problem has been gradually worsening. Associated symptoms include arthralgias and chest pain. Pertinent negatives include no abdominal pain, coughing or neck pain. Associated symptoms comments: Breast pain. Exacerbated by: palpaton of chest and ROM of the right shoulder. She has tried nothing for the symptoms. The treatment provided no relief.    History reviewed. No pertinent past medical history. Past Surgical History  Procedure Laterality Date  . Abdominal hysterectomy    . Gastric bypass     No family history on file. History  Substance Use Topics  . Smoking status: Never Smoker   . Smokeless tobacco: Not on file  . Alcohol Use: No   OB History   Grav Para Term Preterm Abortions TAB SAB Ect Mult Living                 Review of Systems  Constitutional: Negative for activity change.       All ROS Neg except as noted in HPI  HENT: Negative for nosebleeds.   Eyes: Negative for photophobia and discharge.  Respiratory: Negative for cough, shortness of breath and wheezing.   Cardiovascular: Positive for chest pain. Negative for palpitations.  Gastrointestinal: Negative for abdominal pain and blood in stool.  Genitourinary: Negative for dysuria, frequency and hematuria.  Musculoskeletal: Positive for arthralgias. Negative for back pain and neck pain.  Skin: Negative.   Neurological: Negative for dizziness, seizures and speech difficulty.  Psychiatric/Behavioral: Negative for hallucinations and  confusion.      Allergies  Ketorolac tromethamine  Home Medications   Current Outpatient Rx  Name  Route  Sig  Dispense  Refill  . acetaminophen-codeine (TYLENOL #3) 300-30 MG per tablet   Oral   Take 1-2 tablets by mouth every 6 (six) hours as needed for moderate pain.   15 tablet   0   . baclofen (LIORESAL) 10 MG tablet   Oral   Take 1 tablet (10 mg total) by mouth 3 (three) times daily.   21 each   0   . dexamethasone (DECADRON) 4 MG tablet      1 po bid with food   12 tablet   0    BP 138/83  Pulse 68  Temp(Src) 97.6 F (36.4 C) (Oral)  Resp 16  Ht 5\' 7"  (1.702 m)  Wt 170 lb (77.111 kg)  BMI 26.62 kg/m2  SpO2 100% Physical Exam  Nursing note and vitals reviewed. Constitutional: She is oriented to person, place, and time. She appears well-developed and well-nourished.  Non-toxic appearance.  HENT:  Head: Normocephalic.  Right Ear: Tympanic membrane and external ear normal.  Left Ear: Tympanic membrane and external ear normal.  Eyes: EOM and lids are normal. Pupils are equal, round, and reactive to light.  Neck: Normal range of motion. Neck supple. Carotid bruit is not present.  Cardiovascular: Normal rate, regular rhythm, normal heart sounds, intact distal pulses and normal pulses.  Exam reveals no gallop and no friction rub.   No murmur heard. Pulmonary/Chest: Breath sounds normal. No respiratory distress. She has  no wheezes. She has no rales. She exhibits tenderness.  Pain with deep breaths. Pt speaks in complete sentences.  Abdominal: Soft. Bowel sounds are normal. There is no tenderness. There is no rebound and no guarding.  Musculoskeletal:       Right shoulder: She exhibits decreased range of motion and tenderness.       Arms: Lymphadenopathy:       Head (right side): No submandibular adenopathy present.       Head (left side): No submandibular adenopathy present.    She has no cervical adenopathy.  Neurological: She is alert and oriented to  person, place, and time. She has normal strength. No cranial nerve deficit or sensory deficit.  No gross neuro deficit. Grip symmetrical. Motor strength of the uppeer extremities symmetrical.  Skin: Skin is warm and dry.  Psychiatric: She has a normal mood and affect. Her speech is normal.    ED Course  Procedures (including critical care time) CRITICAL CARE Performed by: Lenox Ahr Total critical care time: **35 min* Critical care time was exclusive of separately billable procedures and treating other patients. Critical care was necessary to treat or prevent imminent or life-threatening deterioration. Critical care was time spent personally by me on the following activities: development of treatment plan with patient and/or surrogate as well as nursing, discussions with consultants, evaluation of patient's response to treatment, examination of patient, obtaining history from patient or surrogate, ordering and performing treatments and interventions, ordering and review of laboratory studies, ordering and review of radiographic studies, pulse oximetry and re-evaluation of patient's condition. Labs Review Labs Reviewed  CBC WITH DIFFERENTIAL - Abnormal; Notable for the following:    Hemoglobin 11.2 (*)    All other components within normal limits  D-DIMER, QUANTITATIVE - Abnormal; Notable for the following:    D-Dimer, Quant 0.95 (*)    All other components within normal limits  TROPONIN I  BASIC METABOLIC PANEL   Imaging Review No results found.   EKG Interpretation   Date/Time:  Sunday January 30 2014 10:24:27 EDT Ventricular Rate:  73 PR Interval:  158 QRS Duration: 80 QT Interval:  400 QTC Calculation: 440 R Axis:   6 Text Interpretation:  sinus rhythm No ST or T changes Confirmed by Jeneen Rinks   MD, [place] ([id number]) on 01/30/2014 10:31:27 AM      MDM D-Dimer elevated at 0.95. Troponin wnl. Bmet wnl. CBC wnl. EKG nsr. No acute event.   Pt seen with me by Dr Jeneen Rinks. CT angio  chest, neg for PE or dissection.  Xray of the shoulder is negative for dislocation or fx. Previous ED visits and imaging reviewed.  Pt much improved after iv pain meds. Pt able to move shoulder and take deep breaths without pain. Test results discussed with patient and husband. Safe for pt to be discharged home. Suspect musculoskeletal pain. Plan - Rx for tylenol #3, baaclofen and decadron given to the patient. She will return if any changes or problem.   Final diagnoses:  Right shoulder strain  DJD of shoulder    *I have reviewed nursing notes, vital signs, and all appropriate lab and imaging results for this patient.Lenox Ahr, PA-C 02/05/14 1158

## 2014-02-07 NOTE — ED Provider Notes (Signed)
Medical screening examination/treatment/procedure(s) were performed by non-physician practitioner and as supervising physician I was immediately available for consultation/collaboration.   EKG Interpretation   Date/Time:  Sunday January 30 2014 10:24:27 EDT Ventricular Rate:  73 PR Interval:  158 QRS Duration: 80 QT Interval:  400 QTC Calculation: 440 R Axis:   6 Text Interpretation:  sinus rhythm No ST or T changes Confirmed by Jeneen Rinks   MD, [place] ([id number]) on 01/30/2014 10:31:27 AM        Tanna Furry, MD 02/07/14 [phone]

## 2016-06-04 ENCOUNTER — Telehealth: Payer: Self-pay | Admitting: Family Medicine

## 2016-06-04 NOTE — Telephone Encounter (Signed)
Will call and advise patient to try otc medication as she has not re-established yet.

## 2016-06-04 NOTE — Telephone Encounter (Signed)
Olivia Woodward will be a new pt on Aug 15th at 8:30/she was in the ER recently and was put on an antibiotic and now is complaining of a yeast infection, she is asking if Dr. Moshe Cipro would call her in a Diflucan to [facility] in [place] please advise?

## 2016-07-02 ENCOUNTER — Ambulatory Visit (INDEPENDENT_AMBULATORY_CARE_PROVIDER_SITE_OTHER): Payer: Managed Care, Other (non HMO) | Admitting: Family Medicine

## 2016-07-02 ENCOUNTER — Ambulatory Visit: Payer: Self-pay | Admitting: Family Medicine

## 2016-07-02 ENCOUNTER — Encounter: Payer: Self-pay | Admitting: Family Medicine

## 2016-07-02 ENCOUNTER — Encounter (INDEPENDENT_AMBULATORY_CARE_PROVIDER_SITE_OTHER): Payer: Self-pay

## 2016-07-02 VITALS — BP 128/84 | HR 70 | Resp 16 | Ht 67.0 in | Wt 222.4 lb

## 2016-07-02 DIAGNOSIS — M25552 Pain in left hip: Secondary | ICD-10-CM

## 2016-07-02 DIAGNOSIS — Z114 Encounter for screening for human immunodeficiency virus [HIV]: Secondary | ICD-10-CM

## 2016-07-02 DIAGNOSIS — M542 Cervicalgia: Secondary | ICD-10-CM | POA: Diagnosis not present

## 2016-07-02 DIAGNOSIS — R946 Abnormal results of thyroid function studies: Secondary | ICD-10-CM

## 2016-07-02 DIAGNOSIS — D508 Other iron deficiency anemias: Secondary | ICD-10-CM

## 2016-07-02 DIAGNOSIS — Z13220 Encounter for screening for lipoid disorders: Secondary | ICD-10-CM

## 2016-07-02 DIAGNOSIS — E669 Obesity, unspecified: Secondary | ICD-10-CM

## 2016-07-02 DIAGNOSIS — J302 Other seasonal allergic rhinitis: Secondary | ICD-10-CM

## 2016-07-02 DIAGNOSIS — I10 Essential (primary) hypertension: Secondary | ICD-10-CM

## 2016-07-02 MED ORDER — METHYLPREDNISOLONE ACETATE 80 MG/ML IJ SUSP
80.0000 mg | Freq: Once | INTRAMUSCULAR | Status: AC
  Administered 2016-07-02: 80 mg via INTRAMUSCULAR

## 2016-07-02 MED ORDER — GABAPENTIN 300 MG PO CAPS: 300.0000 mg | ORAL_CAPSULE | Freq: Every day | ORAL | 4 refills | Status: DC

## 2016-07-02 MED ORDER — AZELASTINE HCL 0.1 % NA SOLN: 2.0000 | Freq: Two times a day (BID) | NASAL | 12 refills | Status: DC

## 2016-07-02 MED ORDER — PREDNISONE 5 MG (21) PO TBPK: 5.0000 mg | ORAL_TABLET | ORAL | 0 refills | Status: DC

## 2016-07-02 NOTE — Assessment & Plan Note (Addendum)
2 month history rated at an 8 , radiates down arm and awakens patient Needs X ray of C spine Uncontrolled. depo medrol administered IM in the office , to be followed by a short course of oral prednisone and bedtime gabapentin. Currently no weakness or numbness of upper extremity, hopefully conservative management only will be adequate, she knows to call for ongoing symptoms.

## 2016-07-02 NOTE — Patient Instructions (Signed)
Physical exam 3 month , call if you need me before  Depo medrol injection in office today for neck and hip pain followed by prednisone for 6 days only, also gabapentin one at bedtime   Fasting lipid, cmp, hIV, tSH, cBC and vit D today  Xray of neck and hip tomoorw  PLS schedule and get mammogram as  Soon as possible  Astelin for allergies nasal spray  Please work on good  health habits so that your health will improve. 1. Commitment to daily physical activity for 30 to 60  minutes, if you are able to do this.  2. Commitment to wise food choices. Aim for half of your  food intake to be vegetable and fruit, one quarter starchy foods, and one quarter protein. Try to eat on a regular schedule  3 meals per day, snacking between meals should be limited to vegetables or fruits or small portions of nuts. 64 ounces of water per day is generally recommended, unless you have specific health conditions, like heart failure or kidney failure where you will need to limit fluid intake.  3. Commitment to sufficient and a  good quality of physical and mental rest daily, generally between 6 to 8 hours per day.  WITH PERSISTANCE AND PERSEVERANCE, THE IMPOSSIBLE , BECOMES THE NORM!

## 2016-07-07 NOTE — Assessment & Plan Note (Signed)
Uncontrolled, and symptomatic, educated re the need to commit to daily maintainance therapy for improved quality of life and to reduce chance of infection

## 2016-07-07 NOTE — Assessment & Plan Note (Signed)
Deteriorated. Patient re-educated about  the importance of commitment to a  minimum of 150 minutes of exercise per week.  The importance of healthy food choices with portion control discussed. Encouraged to start a food diary, count calories and to consider  joining a support group. Sample diet sheets offered. Goals set by the patient for the next several months.   Weight /BMI 07/02/2016 01/30/2014 07/27/2010  WEIGHT 222 lb 6.4 oz 170 lb 246 lb 4 oz  HEIGHT 5\' 7"  5\' 7"  5\' 6"   BMI 34.83 kg/m2 26.62 kg/m2 39.76 kg/m2

## 2016-07-07 NOTE — Assessment & Plan Note (Signed)
Updated lab needed.  

## 2016-07-07 NOTE — Progress Notes (Signed)
   Olivia Woodward     MRN: [id number]      DOB: June 05, 1966   HPI Olivia Woodward is here to re establish care and for  evaluation of chronic medical conditions, Preventive health is updated, specifically  Cancer screening and Immunization.   Main c/o is of approx 2 month h/o worsening and debilitating neck and left hip pain which is negatively impacting her quality of life. Also has uncontrolled allergies Working on weight , a lot of which which she unfortunately has regained  ROS C/o sinus pressure and nasal congestion with clear drainage , denies recent fever or chills. Denies chest congestion, productive cough or wheezing. Denies chest pains, palpitations and leg swelling Denies abdominal pain, nausea, vomiting,diarrhea or constipation.   Denies dysuria, frequency, hesitancy or incontinence. . Denies headaches, seizures, numbness, or tingling. Denies depression, anxiety or insomnia. Denies skin break down or rash.   PE  BP 128/84   Pulse 70   Resp 16   Ht 5\' 7"  (1.702 m)   Wt 222 lb 6.4 oz (100.9 kg)   SpO2 99%   BMI 34.83 kg/m   Patient alert and oriented and in no cardiopulmonary distress.  HEENT: No facial asymmetry, EOMI,   oropharynx pink and moist.  Neck decreased ROM no JVD, no mass. Erythema ands edema of nasal mucosa, TM clear Chest: Clear to auscultation bilaterally.  CVS: S1, S2 no murmurs, no S3.Regular rate.  ABD: Soft non tender.   Ext: No edema  MS: Adequate though reduced  ROM lumbar  spine, and left hip, normal in shoulders,  and knees.  Skin: Intact, no ulcerations or rash noted.  Psych: Good eye contact, normal affect. Memory intact not anxious or depressed appearing.  CNS: CN 2-12 intact, power,  normal throughout.no focal deficits noted.   Assessment & Plan  Neck pain on right side 2 month history rated at an 8 , radiates down arm and awakens patient Needs X ray of C spine Uncontrolled.Toradol and depo medrol administered IM in the  office , to be followed by a short course of oral prednisone and bedtime gabapentin. Currently no weakness or numbness of upper extremity, hopefully conservative management only will be adequate, she knows to call for ongoing symptoms.   Left hip pain 2 month h/o left hip pain rated at a 6, no inciting trauma, worse with weight bearing, radiates to groin, likely referred from lumbar spine, but will start wit Xray of hip Uncontrolled.Toradol and depo medrol administered IM in the office , to be followed by a short course of oral prednisone and gabapentin at bedtime.   Seasonal allergies Uncontrolled, and symptomatic, educated re the need to commit to daily maintainance therapy for improved quality of life and to reduce chance of infection  THYROID FUNCTION TEST, ABNORMAL Updated lab needed .   OBESITY Deteriorated. Patient re-educated about  the importance of commitment to a  minimum of 150 minutes of exercise per week.  The importance of healthy food choices with portion control discussed. Encouraged to start a food diary, count calories and to consider  joining a support group. Sample diet sheets offered. Goals set by the patient for the next several months.   Weight /BMI 07/02/2016 01/30/2014 07/27/2010  WEIGHT 222 lb 6.4 oz 170 lb 246 lb 4 oz  HEIGHT 5\' 7"  5\' 7"  5\' 6"   BMI 34.83 kg/m2 26.62 kg/m2 39.76 kg/m2

## 2016-07-07 NOTE — Assessment & Plan Note (Addendum)
2 month h/o left hip pain rated at a 6, no inciting trauma, worse with weight bearing, radiates to groin, likely referred from lumbar spine, but will start wit Xray of hip Uncontrolled. depo medrol administered IM in the office , to be followed by a short course of oral prednisone and gabapentin at bedtime.

## 2016-09-17 ENCOUNTER — Encounter (HOSPITAL_COMMUNITY): Payer: Self-pay

## 2016-11-02 ENCOUNTER — Emergency Department (HOSPITAL_COMMUNITY)
Admission: EM | Admit: 2016-11-02 | Discharge: 2016-11-02 | Disposition: A | Discharge status: Home / Self Care | Payer: Managed Care, Other (non HMO) | Attending: Emergency Medicine | Admitting: Emergency Medicine

## 2016-11-02 ENCOUNTER — Encounter (HOSPITAL_COMMUNITY): Payer: Self-pay | Admitting: Emergency Medicine

## 2016-11-02 ENCOUNTER — Emergency Department (HOSPITAL_COMMUNITY): Payer: Managed Care, Other (non HMO)

## 2016-11-02 DIAGNOSIS — Z79899 Other long term (current) drug therapy: Secondary | ICD-10-CM | POA: Insufficient documentation

## 2016-11-02 DIAGNOSIS — W1839XA Other fall on same level, initial encounter: Secondary | ICD-10-CM | POA: Diagnosis not present

## 2016-11-02 DIAGNOSIS — Y999 Unspecified external cause status: Secondary | ICD-10-CM | POA: Insufficient documentation

## 2016-11-02 DIAGNOSIS — S93402A Sprain of unspecified ligament of left ankle, initial encounter: Secondary | ICD-10-CM | POA: Diagnosis not present

## 2016-11-02 DIAGNOSIS — Y939 Activity, unspecified: Secondary | ICD-10-CM | POA: Diagnosis not present

## 2016-11-02 DIAGNOSIS — I10 Essential (primary) hypertension: Secondary | ICD-10-CM | POA: Diagnosis not present

## 2016-11-02 DIAGNOSIS — Y929 Unspecified place or not applicable: Secondary | ICD-10-CM | POA: Diagnosis not present

## 2016-11-02 DIAGNOSIS — M545 Low back pain: Secondary | ICD-10-CM | POA: Diagnosis not present

## 2016-11-02 DIAGNOSIS — M25572 Pain in left ankle and joints of left foot: Secondary | ICD-10-CM | POA: Diagnosis present

## 2016-11-02 MED ORDER — HYDROCODONE-ACETAMINOPHEN 5-325 MG PO TABS
2.0000 | ORAL_TABLET | Freq: Once | ORAL | Status: AC
  Administered 2016-11-02: 2 via ORAL
  Filled 2016-11-02: qty 2

## 2016-11-02 MED ORDER — HYDROCODONE-ACETAMINOPHEN 5-325 MG PO TABS: 2.0000 | ORAL_TABLET | ORAL | 0 refills | Status: DC | PRN

## 2016-11-02 MED ORDER — HYDROCODONE-ACETAMINOPHEN 5-325 MG PO TABS: 2.0000 | ORAL_TABLET | Freq: Once | ORAL | Status: DC

## 2016-11-02 NOTE — Discharge Instructions (Signed)
See Dr. Aline Brochure for recheck if pain persist past one week

## 2016-11-02 NOTE — ED Triage Notes (Signed)
Pt states she was sitting on a stool and her foot went to sleep. Pt attempted to stand and fell forward. Pt c/o L ankle pain.

## 2016-11-03 NOTE — ED Provider Notes (Signed)
[facility] DEPT Provider Note   CSN: [id number] Arrival date & time: 11/02/16  1143     History   Chief Complaint Chief Complaint  Patient presents with  . Fall    HPI Olivia Woodward is a 50 y.o. female.  The history is provided by the patient. No language interpreter was used.  Fall  This is a new problem. The current episode started 1 to 2 hours ago. The problem occurs constantly. The problem has not changed since onset.Pertinent negatives include no chest pain and no abdominal pain. The symptoms are aggravated by walking. Nothing relieves the symptoms. She has tried nothing for the symptoms. The treatment provided no relief.   Pt reports he foot went to sleep.  Pt reports she stood up and ankle turned under her.  Pt complains of soreness in her low back.  Pt complains of pain and swelling to her left ankle Past Medical History:  Diagnosis Date  . Anemia   . Generalized headaches   . Hypertension     Patient Active Problem List   Diagnosis Date Noted  . Neck pain on right side 07/02/2016  . Left hip pain 07/02/2016  . Seasonal allergies 07/02/2016  . B12 DEFICIENCY 07/27/2010  . THYROID FUNCTION TEST, ABNORMAL 07/26/2010  . ANEMIA 07/25/2010  . FATIGUE 07/25/2010  . OTHER DRUG ALLERGY 05/03/2010  . IRON DEFIC ANEMIA [place] DIET IRON INTAKE 02/09/2009  . Goiter, unspecified 11/17/2007  . OBESITY 11/17/2007    Past Surgical History:  Procedure Laterality Date  . ABDOMINAL HYSTERECTOMY     fibroids, partial  . BREAST REDUCTION SURGERY  2005  . GASTRIC BYPASS  2007    OB History    Gravida Para Term Preterm AB Living   2 2 2          SAB TAB Ectopic Multiple Live Births                   Home Medications    Prior to Admission medications   Medication Sig Start Date End Date Taking? Authorizing Provider  azelastine (ASTELIN) 0.1 % nasal spray Place 2 sprays into both nostrils 2 (two) times daily. Use in each nostril as directed 07/02/16   Fayrene Helper, MD  gabapentin (NEURONTIN) 300 MG capsule Take 1 capsule (300 mg total) by mouth at bedtime. 07/02/16   Fayrene Helper, MD  HYDROcodone-acetaminophen (NORCO/VICODIN) 5-325 MG tablet Take 2 tablets by mouth every 4 (four) hours as needed. 11/02/16   Fransico Meadow, PA-C  predniSONE (STERAPRED UNI-PAK 21 TAB) 5 MG (21) TBPK tablet Take 1 tablet (5 mg total) by mouth as directed. Use as directed 07/02/16   Fayrene Helper, MD    Family History Family History  Problem Relation Age of Onset  . Hypertension Mother   . Diabetes Mother   . Hypertension Father   . Hypertension Sister   . Hypertension Sister     Social History Social History  Substance Use Topics  . Smoking status: Never Smoker  . Smokeless tobacco: Never Used  . Alcohol use No     Allergies   Ketorolac tromethamine   Review of Systems Review of Systems  Cardiovascular: Negative for chest pain.  Gastrointestinal: Negative for abdominal pain.  All other systems reviewed and are negative.    Physical Exam Updated Vital Signs BP 127/77 (BP Location: Left Arm)   Pulse 74   Temp 98.2 F (36.8 C) (Oral)   Resp 16  Ht 5\' 6"  (1.676 m)   Wt 102.1 kg   SpO2 100%   BMI 36.32 kg/m   Physical Exam  Constitutional: She is oriented to person, place, and time. She appears well-developed and well-nourished.  HENT:  Head: Normocephalic.  Eyes: EOM are normal.  Neck: Normal range of motion.  Cardiovascular: Normal rate.   Pulmonary/Chest: Effort normal.  Abdominal: She exhibits no distension.  Musculoskeletal: She exhibits tenderness.  Swollen left ankle, pain with movement. nv and ns intact  Neurological: She is alert and oriented to person, place, and time.  Psychiatric: She has a normal mood and affect.  Nursing note and vitals reviewed.    ED Treatments / Results  Labs (all labs ordered are listed, but only abnormal results are displayed) Labs Reviewed - No data to display  EKG  EKG  Interpretation None       Radiology Dg Ankle Complete Left  Result Date: 11/02/2016 CLINICAL DATA:  Fall, left ankle pain and swelling. EXAM: LEFT ANKLE COMPLETE - 3+ VIEW COMPARISON:  None. FINDINGS: No acute bony abnormality. Specifically, no fracture, subluxation, or dislocation. Soft tissues are intact. Plantar and posterior calcaneal spurs. IMPRESSION: No acute bony abnormality. Electronically Signed   By: Rolm Baptise M.D.   On: 11/02/2016 12:19    Procedures Procedures (including critical care time)  Medications Ordered in ED Medications  HYDROcodone-acetaminophen (NORCO/VICODIN) 5-325 MG per tablet 2 tablet (2 tablets Oral Given 11/02/16 1255)     Initial Impression / Assessment and Plan / ED Course  I have reviewed the triage vital signs and the nursing notes.  Pertinent labs & imaging results that were available during my care of the patient were reviewed by me and considered in my medical decision making (see chart for details).  Clinical Course     Pt given aso and crutches.  I advised see Dr. Aline Brochure for recheck in 1 week  Final Clinical Impressions(s) / ED Diagnoses   Final diagnoses:  Sprain of left ankle, unspecified ligament, initial encounter  Moderate ankle sprain, left, initial encounter    New Prescriptions Discharge Medication List as of 11/02/2016 12:43 PM    An After Visit Summary was printed and given to the patient.   Hollace Kinnier [place], PA-C 11/03/16 0831    [place], MD 11/03/16 1003

## 2016-11-12 ENCOUNTER — Ambulatory Visit (INDEPENDENT_AMBULATORY_CARE_PROVIDER_SITE_OTHER): Payer: Managed Care, Other (non HMO) | Admitting: Family Medicine

## 2016-11-12 ENCOUNTER — Encounter: Payer: Self-pay | Admitting: Family Medicine

## 2016-11-12 DIAGNOSIS — S93402S Sprain of unspecified ligament of left ankle, sequela: Secondary | ICD-10-CM | POA: Diagnosis not present

## 2016-11-12 MED ORDER — IBUPROFEN 800 MG PO TABS: 800.0000 mg | ORAL_TABLET | Freq: Three times a day (TID) | ORAL | 0 refills | Status: DC | PRN

## 2016-11-12 NOTE — Patient Instructions (Addendum)
Ankle Sprain Introduction An ankle sprain is a stretch or tear in one of the tough tissues (ligaments) in your ankle. Follow these instructions at home:  Rest your ankle.  Take over-the-counter and prescription medicines only as told by your doctor.  For 2-3 days, keep your ankle higher than the level of your heart (elevated) as much as possible.  If directed, put ice on the area:  Put ice in a plastic bag.  Place a towel between your skin and the bag.  Leave the ice on for 20 minutes, 2-3 times a day.  If you were given a brace:  Wear it as told.  Take it off to shower or bathe.  Try not to move your ankle much, but wiggle your toes from time to time. This helps to prevent swelling.  If you were given an elastic bandage (dressing):  Take it off when you shower or bathe.  Try not to move your ankle much, but wiggle your toes from time to time. This helps to prevent swelling.  Adjust the bandage to make it more comfortable if it feels too tight.  Loosen the bandage if you lose feeling in your foot, your foot tingles, or your foot gets cold and blue.  If you have crutches, use them as told by your doctor. Continue to use them until you can walk without feeling pain in your ankle. Contact a doctor if:  Your bruises or swelling are quickly getting worse.  Your pain does not get better after you take medicine. Get help right away if:  You cannot feel your toes or foot.  Your toes or your foot looks blue.  You have very bad pain that gets worse. This information is not intended to replace advice given to you by your health care provider. Make sure you discuss any questions you have with your health care provider. Document Released: 04/22/2008 Document Revised: 04/11/2016 Document Reviewed: 06/06/2015  2017 Elsevier  

## 2016-11-12 NOTE — Progress Notes (Signed)
Chief Complaint  Patient presents with  . Ankle Injury    fell at home in [facility]   ER follow up for ankle sprain L Notes reviewed and x rays discussed with patient Is on crutches Still cannot pear weight Is is brace Is limiting activity No prior ankle problems   Patient Active Problem List   Diagnosis Date Noted  . Moderate ankle sprain, left, sequela 11/12/2016  . Neck pain on right side 07/02/2016  . Left hip pain 07/02/2016  . Seasonal allergies 07/02/2016  . B12 DEFICIENCY 07/27/2010  . THYROID FUNCTION TEST, ABNORMAL 07/26/2010  . ANEMIA 07/25/2010  . FATIGUE 07/25/2010  . OTHER DRUG ALLERGY 05/03/2010  . IRON DEFIC ANEMIA [place] DIET IRON INTAKE 02/09/2009  . Goiter, unspecified 11/17/2007  . OBESITY 11/17/2007    Outpatient Encounter Prescriptions as of 11/12/2016  Medication Sig  . azelastine (ASTELIN) 0.1 % nasal spray Place 2 sprays into both nostrils 2 (two) times daily. Use in each nostril as directed  . gabapentin (NEURONTIN) 300 MG capsule Take 1 capsule (300 mg total) by mouth at bedtime.  Marland Kitchen HYDROcodone-acetaminophen (NORCO/VICODIN) 5-325 MG tablet Take 2 tablets by mouth every 4 (four) hours as needed. (Patient not taking: Reported on 11/12/2016)  . ibuprofen (ADVIL,MOTRIN) 800 MG tablet Take 1 tablet (800 mg total) by mouth every 8 (eight) hours as needed.  . predniSONE (STERAPRED UNI-PAK 21 TAB) 5 MG (21) TBPK tablet Take 1 tablet (5 mg total) by mouth as directed. Use as directed (Patient not taking: Reported on 11/12/2016)   No facility-administered encounter medications on file as of 11/12/2016.     Allergies  Allergen Reactions  . Ketorolac Tromethamine Hives    Lips swelled    Review of Systems  Constitutional: Positive for activity change.       Is staying off foot  Respiratory: Negative for shortness of breath.   Cardiovascular: Negative for chest pain.  Musculoskeletal: Positive for gait problem and joint swelling.       Left ankle pain    All other systems reviewed and are negative.   BP 130/88 (BP Location: Right Arm, Patient Position: Sitting, Cuff Size: Large)   Pulse 96   Temp 97.8 F (36.6 C) (Oral)   Resp 18   Ht 5\' 7"  (1.702 m)   Wt 224 lb (101.6 kg)   SpO2 100%   BMI 35.08 kg/m   Physical Exam  Constitutional: She is oriented to person, place, and time. She appears well-developed and well-nourished. No distress.  HENT:  Head: Normocephalic and atraumatic.  Mouth/Throat: Oropharynx is clear and moist.  Musculoskeletal:       Left ankle: She exhibits decreased range of motion and swelling. She exhibits no ecchymosis, no deformity and normal pulse. Tenderness. Lateral malleolus tenderness found.  Tenderness and swelling lateral ankle No instability  Neurological: She is alert and oriented to person, place, and time. She displays normal reflexes. No sensory deficit.  Skin: Skin is warm and dry.  Psychiatric: She has a normal mood and affect. Her behavior is normal. Thought content normal.    ASSESSMENT/PLAN:  1. Moderate ankle sprain, left, sequela  - Ambulatory referral to Orthopedic Surgery   Patient Instructions  Ankle Sprain Introduction An ankle sprain is a stretch or tear in one of the tough tissues (ligaments) in your ankle. Follow these instructions at home:  Rest your ankle.  Take over-the-counter and prescription medicines only as told by your doctor.  For 2-3 days, keep your  ankle higher than the level of your heart (elevated) as much as possible.  If directed, put ice on the area:  Put ice in a plastic bag.  Place a towel between your skin and the bag.  Leave the ice on for 20 minutes, 2-3 times a day.  If you were given a brace:  Wear it as told.  Take it off to shower or bathe.  Try not to move your ankle much, but wiggle your toes from time to time. This helps to prevent swelling.  If you were given an elastic bandage (dressing):  Take it off when you shower or  bathe.  Try not to move your ankle much, but wiggle your toes from time to time. This helps to prevent swelling.  Adjust the bandage to make it more comfortable if it feels too tight.  Loosen the bandage if you lose feeling in your foot, your foot tingles, or your foot gets cold and blue.  If you have crutches, use them as told by your doctor. Continue to use them until you can walk without feeling pain in your ankle. Contact a doctor if:  Your bruises or swelling are quickly getting worse.  Your pain does not get better after you take medicine. Get help right away if:  You cannot feel your toes or foot.  Your toes or your foot looks blue.  You have very bad pain that gets worse. This information is not intended to replace advice given to you by your health care provider. Make sure you discuss any questions you have with your health care provider. Document Released: 04/22/2008 Document Revised: 04/11/2016 Document Reviewed: 06/06/2015  2017 Elsevier    Raylene Everts, MD

## 2016-12-12 ENCOUNTER — Encounter: Payer: Self-pay | Admitting: Orthopaedic Surgery

## 2016-12-16 ENCOUNTER — Telehealth: Payer: Self-pay | Admitting: Family Medicine

## 2016-12-16 ENCOUNTER — Telehealth: Payer: Self-pay

## 2016-12-16 NOTE — Telephone Encounter (Signed)
Olivia Woodward is asking for a returned call from Dr Moshe Cipro she states its to hard to get an appointment and she is very unhappy, please advise?

## 2016-12-16 NOTE — Telephone Encounter (Signed)
pls let her know I have been reviewing her file , there is an open order for x ray of the hip , since she is c/o hip pain , I am asking her to h get the x ray and see where we go from there. I just tried to call her and left her a message. I will be available to spk with her at end  Of day when I finish seeing patients and will try calling her again, also , pls let her know

## 2016-12-16 NOTE — Telephone Encounter (Signed)
Second message left on pt's phone, no response

## 2016-12-25 ENCOUNTER — Emergency Department (HOSPITAL_COMMUNITY)
Admission: EM | Admit: 2016-12-25 | Discharge: 2016-12-25 | Disposition: A | Discharge status: Home / Self Care | Payer: Managed Care, Other (non HMO) | Attending: Emergency Medicine | Admitting: Emergency Medicine

## 2016-12-25 ENCOUNTER — Encounter (HOSPITAL_COMMUNITY): Payer: Self-pay | Admitting: Emergency Medicine

## 2016-12-25 ENCOUNTER — Emergency Department (HOSPITAL_COMMUNITY): Payer: Managed Care, Other (non HMO)

## 2016-12-25 DIAGNOSIS — N390 Urinary tract infection, site not specified: Secondary | ICD-10-CM | POA: Diagnosis not present

## 2016-12-25 DIAGNOSIS — I10 Essential (primary) hypertension: Secondary | ICD-10-CM | POA: Diagnosis not present

## 2016-12-25 DIAGNOSIS — M25552 Pain in left hip: Secondary | ICD-10-CM

## 2016-12-25 LAB — URINALYSIS, ROUTINE W REFLEX MICROSCOPIC
Bilirubin Urine: NEGATIVE
Glucose, UA: NEGATIVE mg/dL
KETONES UR: NEGATIVE mg/dL
Nitrite: POSITIVE — AB
PH: 6 (ref 5.0–8.0)
Protein, ur: 30 mg/dL — AB
Specific Gravity, Urine: 1.017 (ref 1.005–1.030)

## 2016-12-25 MED ORDER — CEPHALEXIN 500 MG PO CAPS: 500.0000 mg | ORAL_CAPSULE | Freq: Four times a day (QID) | ORAL | 0 refills | Status: DC

## 2016-12-25 MED ORDER — DIPHENHYDRAMINE HCL 50 MG/ML IJ SOLN
12.5000 mg | Freq: Once | INTRAMUSCULAR | Status: AC
  Administered 2016-12-25: 12.5 mg via INTRAMUSCULAR
  Filled 2016-12-25: qty 1

## 2016-12-25 MED ORDER — PREDNISONE 20 MG PO TABS
40.0000 mg | ORAL_TABLET | Freq: Once | ORAL | Status: AC
  Administered 2016-12-25: 40 mg via ORAL
  Filled 2016-12-25: qty 2

## 2016-12-25 MED ORDER — TRAMADOL HCL 50 MG PO TABS: 50.0000 mg | ORAL_TABLET | Freq: Four times a day (QID) | ORAL | 0 refills | Status: DC | PRN

## 2016-12-25 MED ORDER — MORPHINE SULFATE (PF) 4 MG/ML IV SOLN
6.0000 mg | Freq: Once | INTRAVENOUS | Status: AC
  Administered 2016-12-25: 6 mg via INTRAMUSCULAR
  Filled 2016-12-25: qty 2

## 2016-12-25 MED ORDER — DIPHENHYDRAMINE HCL 25 MG PO CAPS
25.0000 mg | ORAL_CAPSULE | Freq: Once | ORAL | Status: AC
  Administered 2016-12-25: 25 mg via ORAL
  Filled 2016-12-25: qty 1

## 2016-12-25 NOTE — ED Triage Notes (Signed)
Patient complaining of left hip pain "for about 2 months." Denies injury.

## 2016-12-25 NOTE — Discharge Instructions (Signed)
Alternate ice and heat to your hip.  Take the antibiotic as directed until its finished.  Drink plenty of water.  Keep your appt with Dr. Moshe Cipro next week.  Return to ER for any worsening symptoms

## 2016-12-26 NOTE — Telephone Encounter (Signed)
Olivia Woodward called back and scheduled an appointment for 01/02/17 at 2:00 with Dr. Moshe Cipro I advised her to go have x-ray of her hip before appointment and she agreed

## 2016-12-26 NOTE — Telephone Encounter (Signed)
Noted  

## 2016-12-27 NOTE — ED Provider Notes (Signed)
[name] Provider Note   CSN: [id number] Arrival date & time: 12/25/16  1925     History   Chief Complaint Chief Complaint  Patient presents with  . Hip Pain    HPI Olivia Woodward is a 51 y.o. female.  HPI  Olivia Woodward is a 51 y.o. female who presents to the Emergency Department complaining of persistent left hip pain for 2 months.  She reports aching pain to the anterior and lateral hip that is worse upon waking and certain movements and slightly improves with walking. She has been taking ibuprofen with some relief.  She denies fever, back or abdominal pain, leg pain or numbness and dysuria. No known injury.  She states that her PCP is aware of her hip pain, but has not evaluated this and has an appt with her next week.      Past Medical History:  Diagnosis Date  . Anemia   . Generalized headaches   . Hypertension     Patient Active Problem List   Diagnosis Date Noted  . Moderate ankle sprain, left, sequela 11/12/2016  . Neck pain on right side 07/02/2016  . Left hip pain 07/02/2016  . Seasonal allergies 07/02/2016  . B12 DEFICIENCY 07/27/2010  . THYROID FUNCTION TEST, ABNORMAL 07/26/2010  . ANEMIA 07/25/2010  . FATIGUE 07/25/2010  . OTHER DRUG ALLERGY 05/03/2010  . IRON DEFIC ANEMIA [place] DIET IRON INTAKE 02/09/2009  . Goiter, unspecified 11/17/2007  . OBESITY 11/17/2007    Past Surgical History:  Procedure Laterality Date  . ABDOMINAL HYSTERECTOMY     fibroids, partial  . BREAST REDUCTION SURGERY  2005  . GASTRIC BYPASS  2007    OB History    Gravida Para Term Preterm AB Living   2 2 2          SAB TAB Ectopic Multiple Live Births                   Home Medications    Prior to Admission medications   Medication Sig Start Date End Date Taking? Authorizing Provider  gabapentin (NEURONTIN) 300 MG capsule Take 300 mg by mouth 3 (three) times daily.  12/25/16  Yes Historical Provider, MD  ibuprofen (ADVIL,MOTRIN) 800 MG tablet Take 1  tablet (800 mg total) by mouth every 8 (eight) hours as needed. 11/12/16  Yes Raylene Everts, MD  cephALEXin (KEFLEX) 500 MG capsule Take 1 capsule (500 mg total) by mouth 4 (four) times daily. For 7 days 12/25/16   Kem Parkinson, PA-C  HYDROcodone-acetaminophen (NORCO/VICODIN) 5-325 MG tablet Take 2 tablets by mouth every 4 (four) hours as needed. Patient not taking: Reported on 11/12/2016 11/02/16   Fransico Meadow, PA-C  traMADol (ULTRAM) 50 MG tablet Take 1 tablet (50 mg total) by mouth every 6 (six) hours as needed. 12/25/16   Chianne Byrns, PA-C    Family History Family History  Problem Relation Age of Onset  . Hypertension Mother   . Diabetes Mother   . Hypertension Father   . Hypertension Sister   . Hypertension Sister     Social History Social History  Substance Use Topics  . Smoking status: Never Smoker  . Smokeless tobacco: Never Used  . Alcohol use No     Allergies   Ketorolac tromethamine and Morphine and related   Review of Systems Review of Systems  Constitutional: Negative for chills and fever.  Respiratory: Negative for chest tightness and shortness of breath.   Cardiovascular: Negative for  chest pain.  Gastrointestinal: Negative for abdominal pain, diarrhea, nausea and vomiting.  Genitourinary: Negative for difficulty urinating, dysuria, flank pain and frequency.  Musculoskeletal: Positive for arthralgias (left hip pain). Negative for joint swelling.  Skin: Negative for color change, rash and wound.  Neurological: Negative for weakness and numbness.  All other systems reviewed and are negative.    Physical Exam Updated Vital Signs BP 161/94 (BP Location: Right Arm)   Pulse 60   Temp 98.3 F (36.8 C) (Oral)   Resp 20   Ht 5\' 6"  (1.676 m)   Wt 102.1 kg   SpO2 100%   BMI 36.32 kg/m   Physical Exam  Constitutional: She is oriented to person, place, and time. She appears well-developed and well-nourished. No distress.  Cardiovascular: Normal  rate, regular rhythm and intact distal pulses.   Pulmonary/Chest: Effort normal and breath sounds normal. No respiratory distress.  Abdominal: Soft. She exhibits no distension and no mass. There is no tenderness. There is no rebound and no guarding.  Musculoskeletal: She exhibits no edema.       Left hip: She exhibits tenderness. She exhibits normal strength, no swelling and no crepitus.       Legs: ttp of the anterior and lateral left hip.  Pain reproduced with SLR, internal and external rotation of the joint.  No crepitus.    Neurological: She is alert and oriented to person, place, and time. No sensory deficit. She exhibits normal muscle tone. Coordination normal.  Nursing note and vitals reviewed.    ED Treatments / Results  Labs (all labs ordered are listed, but only abnormal results are displayed) Labs Reviewed  URINE CULTURE - Abnormal; Notable for the following:       Result Value   Culture   (*)    Value: >=100,000 COLONIES/mL STAPHYLOCOCCUS AUREUS SUSCEPTIBILITIES TO FOLLOW Performed at [facility], [address] [place],  [address]    All other components within normal limits  URINALYSIS, ROUTINE W REFLEX MICROSCOPIC - Abnormal; Notable for the following:    APPearance CLOUDY (*)    Hgb urine dipstick SMALL (*)    Protein, ur 30 (*)    Nitrite POSITIVE (*)    Leukocytes, UA LARGE (*)    Bacteria, UA RARE (*)    Non Squamous Epithelial 0-5 (*)    All other components within normal limits    EKG  EKG Interpretation None       Radiology Dg Hip Unilat With Pelvis 2-3 Views Left  Result Date: 12/25/2016 CLINICAL DATA:  Left hip pain for 2 months. EXAM: DG HIP (WITH OR WITHOUT PELVIS) 2-3V LEFT COMPARISON:  None. FINDINGS: There is no evidence of hip fracture or dislocation. There is no evidence of arthropathy or other focal bone abnormality. IMPRESSION: Negative. Electronically Signed   By: Misty Stanley M.D.   On: 12/25/2016 20:16     Procedures Procedures (including critical care time)  Medications Ordered in ED Medications  morphine 4 MG/ML injection 6 mg (6 mg Intramuscular Given 12/25/16 2118)  diphenhydrAMINE (BENADRYL) capsule 25 mg (25 mg Oral Given 12/25/16 2144)  diphenhydrAMINE (BENADRYL) injection 12.5 mg (12.5 mg Intramuscular Given 12/25/16 2205)  predniSONE (DELTASONE) tablet 40 mg (40 mg Oral Given 12/25/16 2205)     Initial Impression / Assessment and Plan / ED Course  I have reviewed the triage vital signs and the nursing notes.  Pertinent labs & imaging results that were available during my care of the patient were  reviewed by me and considered in my medical decision making (see chart for details).     Pt with chronic left hip pain.  abd is soft, NT.  NV intact.  XR neg. UTI is likely an incidental finding.  No CVA tenderness, fever or dysuria sx's.  Septic joint is doubtful.    On recheck, pt having generalized itching after the morphine.  Benadryl given.  Airway remains patent,  No dyspnea or hives.  Will observe.    after observation, itching has improved.  No further complications.  She appears stable for d/c.  Will tx with keflex.  Agrees to keep upcoming PCP appt  Final Clinical Impressions(s) / ED Diagnoses   Final diagnoses:  Pain of left hip joint  Urinary tract infection in female    New Prescriptions Discharge Medication List as of 12/25/2016 10:44 PM    START taking these medications   Details  cephALEXin (KEFLEX) 500 MG capsule Take 1 capsule (500 mg total) by mouth 4 (four) times daily. For 7 days, Starting Wed 12/25/2016, Print    traMADol (ULTRAM) 50 MG tablet Take 1 tablet (50 mg total) by mouth every 6 (six) hours as needed., Starting Wed 12/25/2016, Print         Kem Parkinson, PA-C 12/27/16 1240    Merrily Pew, MD 01/06/17 [phone]

## 2016-12-28 LAB — URINE CULTURE: Culture: >=100000 — AB

## 2016-12-29 ENCOUNTER — Telehealth: Payer: Self-pay

## 2016-12-29 NOTE — Telephone Encounter (Signed)
Post ED Visit - Positive Culture Follow-up  Culture report reviewed by antimicrobial stewardship pharmacist:  []  Elenor Quinones, Pharm.D. []  Heide Guile, Pharm.D., BCPS []  Parks Neptune, Pharm.D. []  Alycia Rossetti, Pharm.D., BCPS []  Parker, Pharm.D., BCPS, AAHIVP []  Legrand Como, Pharm.D., BCPS, AAHIVP []  Milus Glazier, Pharm.D. []  Stephens November, Pharm.D. Rachel Rumbarger Pharm D Positive urine culture Treated with Cephalexin, organism sensitive to the same and no further patient follow-up is required at this time.  [name] 12/29/2016, 11:05 AM

## 2017-01-02 ENCOUNTER — Encounter: Payer: Self-pay | Admitting: Family Medicine

## 2017-01-02 ENCOUNTER — Other Ambulatory Visit: Payer: Self-pay | Admitting: Family Medicine

## 2017-01-02 ENCOUNTER — Ambulatory Visit (INDEPENDENT_AMBULATORY_CARE_PROVIDER_SITE_OTHER): Payer: Managed Care, Other (non HMO) | Admitting: Family Medicine

## 2017-01-02 VITALS — BP 124/72 | HR 80 | Resp 18 | Wt 233.0 lb

## 2017-01-02 DIAGNOSIS — D508 Other iron deficiency anemias: Secondary | ICD-10-CM

## 2017-01-02 DIAGNOSIS — M5410 Radiculopathy, site unspecified: Secondary | ICD-10-CM | POA: Diagnosis not present

## 2017-01-02 DIAGNOSIS — Z6837 Body mass index (BMI) 37.0-37.9, adult: Secondary | ICD-10-CM

## 2017-01-02 DIAGNOSIS — IMO0001 Reserved for inherently not codable concepts without codable children: Secondary | ICD-10-CM

## 2017-01-02 DIAGNOSIS — E6609 Other obesity due to excess calories: Secondary | ICD-10-CM

## 2017-01-02 DIAGNOSIS — M542 Cervicalgia: Secondary | ICD-10-CM

## 2017-01-02 DIAGNOSIS — M549 Dorsalgia, unspecified: Secondary | ICD-10-CM | POA: Insufficient documentation

## 2017-01-02 DIAGNOSIS — R946 Abnormal results of thyroid function studies: Secondary | ICD-10-CM | POA: Diagnosis not present

## 2017-01-02 LAB — POCT URINALYSIS DIPSTICK
Bilirubin, UA: NEGATIVE
Blood, UA: NEGATIVE
Glucose, UA: NEGATIVE
Ketones, UA: NEGATIVE
Nitrite, UA: NEGATIVE
Protein, UA: NEGATIVE
Spec Grav, UA: 1.02
Urobilinogen, UA: 4
pH, UA: 7

## 2017-01-02 MED ORDER — HYDROCODONE-ACETAMINOPHEN 5-325 MG PO TABS: ORAL_TABLET | ORAL | 0 refills | Status: DC

## 2017-01-02 MED ORDER — METHYLPREDNISOLONE ACETATE 80 MG/ML IJ SUSP
80.0000 mg | Freq: Once | INTRAMUSCULAR | Status: AC
  Administered 2017-01-02: 80 mg via INTRAMUSCULAR

## 2017-01-02 MED ORDER — PREDNISONE 5 MG (21) PO TBPK: 5.0000 mg | ORAL_TABLET | ORAL | 0 refills | Status: DC

## 2017-01-02 MED ORDER — GABAPENTIN 300 MG PO CAPS: 300.0000 mg | ORAL_CAPSULE | Freq: Two times a day (BID) | ORAL | 3 refills | Status: DC

## 2017-01-02 NOTE — Assessment & Plan Note (Addendum)
Depo medrol 80 mg iM and pred dose pack Gabapentin twice daily, prednisone dose pack Imaging study today. Exam c/w nerve compression affecting left nerve root. Based on symptom severity and accompanying signs , will likely  Need MRI

## 2017-01-02 NOTE — Progress Notes (Signed)
   Olivia Woodward     MRN: [id number]      DOB: 02-21-1966   HPI Olivia Woodward is here for follow up and re-evaluation of chronic medical conditions, medication management and review of any available recent lab and radiology data.  Preventive health is updated, specifically  Cancer screening and Immunization.   Questions or concerns regarding consultations or procedures which the PT has had in the interim are  addressed. One month h/o worsened left thigh pain , unable to weight bear fully, or sleep comfortably, weakness and reduced sensation in left thigh, no incntinence  ROS Denies recent fever or chills. Denies sinus pressure, nasal congestion, ear pain or sore throat. Denies chest congestion, productive cough or wheezing. Denies chest pains, palpitations and leg swelling Denies abdominal pain, nausea, vomiting,diarrhea or constipation.   Denies dysuria, frequency, hesitancy or incontinence. . Denies depression, anxiety or insomnia. Denies skin break down or rash.   PE  BP 124/72   Pulse 80   Resp 18   Wt 233 lb (105.7 kg)   SpO2 97%   BMI 37.61 kg/m   Patient alert and oriented and in no cardiopulmonary distress.  HEENT: No facial asymmetry, EOMI,   oropharynx pink and moist.  Neck decreased ROM no JVD, no mass.  Chest: Clear to auscultation bilaterally.  CVS: S1, S2 no murmurs, no S3.Regular rate.  ABD: Soft non tender.   Ext: No edema  [id number]  ROM lumbar spine, and left hip, normal in shoulders and  knees.  Skin: Intact, no ulcerations or rash noted.  Psych: Good eye contact, normal affect. Memory intact not anxious or depressed appearing.  CNS: CN 2-12 intact, grade 4 power in left thigh and decreased sensation in left thigh, lateral aspect,  Grade 5 power in both upper extremities and rLE  Assessment & Plan  Back pain with left-sided radiculopathy Depo medrol 80 mg iM and pred dose pack Gabapentin twice daily, prednisone dose pack Imaging study  today. Exam c/w nerve compression affecting left nerve root. Based on symptom severity and accompanying signs , will likely  Need MRI  Neck pain on right side Improved but imaging indicated due to symptom severity in past and current lower sppine pathology  IRON DEFIC ANEMIA SEC DIET IRON INTAKE Needs to supplement with twice daily iron  Obesity Deteriorated. Patient re-educated about  the importance of commitment to a  minimum of 150 minutes of exercise per week.  The importance of healthy food choices with portion control discussed. Encouraged to start a food diary, count calories and to consider  joining a support group. Sample diet sheets offered. Goals set by the patient for the next several months.   Weight /BMI 01/02/2017 12/25/2016 11/12/2016  WEIGHT 233 lb 225 lb 224 lb  HEIGHT - 5\' 6"  5\' 7"   BMI 37.61 kg/m2 36.32 kg/m2 35.08 kg/m2

## 2017-01-02 NOTE — Patient Instructions (Addendum)
Physical exam in 3 month, call if you need me sooner  Please schedule your mammogram this is past due  X ray of neck and low back today   Thigh and hip and groin pain , are coming from your low back, I believe  Depomedrol 80 mg im in office today followed by short course of prednisone. Gabapentin is increased to twice daily, and limited hydrocodone prescribed for bedtime use.  I believe that you need an MRI of your low back and ask that you contact us  around mid week next week to let us know if pain has significantly improved , if so I will hold on furhter imaging  Urine today is much improved, please complete antibiotic course   CBC, lipid, chem 7 , tSH  today

## 2017-01-02 NOTE — Assessment & Plan Note (Addendum)
Improved but imaging indicated due to symptom severity in past and current lower sppine pathology

## 2017-01-03 LAB — BASIC METABOLIC PANEL
BUN: 10 mg/dL (ref 7–25)
CALCIUM: 8.6 mg/dL (ref 8.6–10.2)
CO2: 27 mmol/L (ref 20–31)
Chloride: 106 mmol/L (ref 98–110)
Creat: 0.64 mg/dL (ref 0.50–1.10)
GLUCOSE: 81 mg/dL (ref 65–99)
Potassium: 4.3 mmol/L (ref 3.5–5.3)
Sodium: 139 mmol/L (ref 135–146)

## 2017-01-03 LAB — LIPID PANEL
Cholesterol: 152 mg/dL (ref <200)
HDL: 68 mg/dL (ref >50)
LDL CALC: 74 mg/dL (ref <100)
Total CHOL/HDL Ratio: 2.2 Ratio (ref <5.0)
Triglycerides: 52 mg/dL (ref <150)
VLDL: 10 mg/dL (ref <30)

## 2017-01-03 LAB — CBC
HEMATOCRIT: 32.4 % — AB (ref 35.0–45.0)
Hemoglobin: 9.7 g/dL — ABNORMAL LOW (ref 11.7–15.5)
MCH: 26.1 pg — AB (ref 27.0–33.0)
MCHC: 29.9 g/dL — AB (ref 32.0–36.0)
MCV: 87.3 fL (ref 80.0–100.0)
MPV: 9.2 fL (ref 7.5–12.5)
Platelets: 285 10*3/uL (ref 140–400)
RBC: 3.71 MIL/uL — ABNORMAL LOW (ref 3.80–5.10)
RDW: 15.3 % — AB (ref 11.0–15.0)
WBC: 5.3 10*3/uL (ref 3.8–10.8)

## 2017-01-03 LAB — IRON,TIBC AND FERRITIN PANEL
%SAT: 5 % — ABNORMAL LOW (ref 11–50)
FERRITIN: 7 ng/mL — AB (ref 10–232)
Iron: 19 ug/dL — ABNORMAL LOW (ref 40–190)
TIBC: 348 ug/dL (ref 250–450)

## 2017-01-03 LAB — TSH: TSH: 0.58 mIU/L

## 2017-01-03 LAB — B12 AND FOLATE PANEL
Folate: 11.4 ng/mL (ref >5.4)
Vitamin B-12: 194 pg/mL — ABNORMAL LOW (ref 200–1100)

## 2017-01-03 LAB — HIV ANTIBODY (ROUTINE TESTING W REFLEX): HIV 1&2 Ab, 4th Generation: NONREACTIVE

## 2017-01-03 NOTE — Assessment & Plan Note (Signed)
Deteriorated. Patient re-educated about  the importance of commitment to a  minimum of 150 minutes of exercise per week.  The importance of healthy food choices with portion control discussed. Encouraged to start a food diary, count calories and to consider  joining a support group. Sample diet sheets offered. Goals set by the patient for the next several months.   Weight /BMI 01/02/2017 12/25/2016 11/12/2016  WEIGHT 233 lb 225 lb 224 lb  HEIGHT - 5\' 6"  5\' 7"   BMI 37.61 kg/m2 36.32 kg/m2 35.08 kg/m2

## 2017-01-03 NOTE — Assessment & Plan Note (Signed)
Needs to supplement with twice daily iron

## 2017-04-09 ENCOUNTER — Encounter: Payer: Managed Care, Other (non HMO) | Admitting: Family Medicine

## 2017-04-10 ENCOUNTER — Encounter: Payer: Self-pay | Admitting: Family Medicine

## 2017-08-18 ENCOUNTER — Encounter (HOSPITAL_COMMUNITY): Payer: Self-pay

## 2017-12-01 ENCOUNTER — Encounter: Payer: Managed Care, Other (non HMO) | Admitting: Family Medicine

## 2017-12-25 ENCOUNTER — Other Ambulatory Visit (HOSPITAL_COMMUNITY)
Admission: RE | Admit: 2017-12-25 | Discharge: 2017-12-25 | Disposition: A | Discharge status: Home / Self Care | Payer: Managed Care, Other (non HMO) | Source: Ambulatory Visit | Attending: Family Medicine | Admitting: Family Medicine

## 2017-12-25 ENCOUNTER — Encounter: Payer: Self-pay | Admitting: Family Medicine

## 2017-12-25 ENCOUNTER — Ambulatory Visit (INDEPENDENT_AMBULATORY_CARE_PROVIDER_SITE_OTHER): Payer: Managed Care, Other (non HMO) | Admitting: Family Medicine

## 2017-12-25 VITALS — BP 142/90 | Resp 16 | Ht 67.0 in | Wt 228.0 lb

## 2017-12-25 DIAGNOSIS — N750 Cyst of Bartholin's gland: Secondary | ICD-10-CM | POA: Diagnosis not present

## 2017-12-25 DIAGNOSIS — Z6837 Body mass index (BMI) 37.0-37.9, adult: Secondary | ICD-10-CM | POA: Diagnosis not present

## 2017-12-25 DIAGNOSIS — Z1231 Encounter for screening mammogram for malignant neoplasm of breast: Secondary | ICD-10-CM

## 2017-12-25 DIAGNOSIS — Z0000 Encounter for general adult medical examination without abnormal findings: Secondary | ICD-10-CM | POA: Diagnosis not present

## 2017-12-25 DIAGNOSIS — Z124 Encounter for screening for malignant neoplasm of cervix: Secondary | ICD-10-CM | POA: Diagnosis present

## 2017-12-25 DIAGNOSIS — Z1211 Encounter for screening for malignant neoplasm of colon: Secondary | ICD-10-CM | POA: Diagnosis present

## 2017-12-25 DIAGNOSIS — J302 Other seasonal allergic rhinitis: Secondary | ICD-10-CM

## 2017-12-25 DIAGNOSIS — D508 Other iron deficiency anemias: Secondary | ICD-10-CM | POA: Diagnosis not present

## 2017-12-25 DIAGNOSIS — R030 Elevated blood-pressure reading, without diagnosis of hypertension: Secondary | ICD-10-CM | POA: Diagnosis not present

## 2017-12-25 LAB — POC HEMOCCULT BLD/STL (OFFICE/1-CARD/DIAGNOSTIC): Fecal Occult Blood, POC: NEGATIVE

## 2017-12-25 MED ORDER — SULFAMETHOXAZOLE-TRIMETHOPRIM 800-160 MG PO TABS: 1.0000 | ORAL_TABLET | Freq: Two times a day (BID) | ORAL | 0 refills | Status: DC

## 2017-12-25 MED ORDER — BENZONATATE 100 MG PO CAPS: 100.0000 mg | ORAL_CAPSULE | Freq: Two times a day (BID) | ORAL | 0 refills | Status: DC | PRN

## 2017-12-25 NOTE — Patient Instructions (Addendum)
F/u in 6 month, call iof you need me sooner  Fastning cBC and anemia panel, lipid, cmp and eGFr, tSH , Vit D this weekend please  Medication will be sent in for your cough as well as fior the boil  Pap is sent  Please work on good  health habits so that your health will improve. 1. Commitment to daily physical activity for 30 to 60  minutes, if you are able to do this.  2. Commitment to wise food choices. Aim for half of your  food intake to be vegetable and fruit, one quarter starchy foods, and one quarter protein. Try to eat on a regular schedule  3 meals per day, snacking between meals should be limited to vegetables or fruits or small portions of nuts. 64 ounces of water per day is generally recommended, unless you have specific health conditions, like heart failure or kidney failure where you will need to limit fluid intake.  3. Commitment to sufficient and a  good quality of physical and mental rest daily, generally between 6 to 8 hours per day.  WITH PERSISTANCE AND PERSEVERANCE, THE IMPOSSIBLE , BECOMES THE NORM! It is important that you exercise regularly at least 30 minutes 5 times a week. If you develop chest pain, have severe difficulty breathing, or feel very tired, stop exercising immediately and seek medical attention

## 2017-12-26 DIAGNOSIS — R030 Elevated blood-pressure reading, without diagnosis of hypertension: Secondary | ICD-10-CM | POA: Insufficient documentation

## 2017-12-26 DIAGNOSIS — N750 Cyst of Bartholin's gland: Secondary | ICD-10-CM | POA: Insufficient documentation

## 2017-12-26 DIAGNOSIS — Z0000 Encounter for general adult medical examination without abnormal findings: Secondary | ICD-10-CM | POA: Insufficient documentation

## 2017-12-26 NOTE — Assessment & Plan Note (Signed)
Deteriorated. Patient re-educated about  the importance of commitment to a  minimum of 150 minutes of exercise per week.  The importance of healthy food choices with portion control discussed. Encouraged to start a food diary, count calories and to consider  joining a support group. Sample diet sheets offered. Goals set by the patient for the next several months.   Weight /BMI 12/25/2017 01/02/2017 12/25/2016  WEIGHT 288 lb 233 lb 225 lb  HEIGHT 5\' 7"  - 5\' 6"   BMI 45.11 kg/m2 37.61 kg/m2 36.32 kg/m2

## 2017-12-26 NOTE — Assessment & Plan Note (Signed)
Antibiotic prescribed 

## 2017-12-26 NOTE — Progress Notes (Signed)
Olivia Woodward     MRN: [id number]      DOB: [birth year]  HPI: Patient is in for annual physical exam. C/o boil on left inner thigh and cough Labs are past due, she will et them this weekend  Immunization is reviewed , and  Is up to date   PE: BP (!) 142/90   Resp 16   Ht 5\' 7"  (1.702 m)   Wt 288 lb (130.6 kg)   SpO2 97%   BMI 45.11 kg/m  Pleasant  female, alert and oriented x 3, in no cardio-pulmonary distress. Afebrile. HEENT No facial trauma or asymetry. Sinuses non tender.  Extra occullar muscles intact, pupils equally reactive to light. External ears normal, tympanic membranes clear. Oropharynx moist, no exudate. Neck: supple, no adenopathy,JVD or thyromegaly.No bruits.  Chest: Clear to ascultation bilaterally.No crackles or wheezes. Non tender to palpation  Breast: No asymetry,no masses or lumps. No tenderness. No nipple discharge or inversion. No axillary or supraclavicular adenopathy  Cardiovascular system; Heart sounds normal,  S1 and  S2 ,no S3.  No murmur, or thrill. Apical beat not displaced Peripheral pulses normal.  Abdomen: Soft, non tender, no organomegaly or masses. No bruits. Bowel sounds normal. No guarding, tenderness or rebound.  Rectal:  Normal sphincter tone. No rectal mass. Guaiac negative stool.  GU: External genitalia normal female genitalia , normal female distribution of hair.cystic lesions on vulva.abscess Urethral meatus normal in size, no  Prolapse, no lesions visibly  Present. Bladder non tender. Vagina pink and moist , with no visible lesions , discharge present . Adequate pelvic support no  cystocele or rectocele noted  Uterus absent, no adnexal tenderness.   Musculoskeletal exam: Full ROM of spine, hips , shoulders and knees. No deformity ,swelling or crepitus noted. No muscle wasting or atrophy.   Neurologic: Cranial nerves 2 to 12 intact. Power, tone ,sensation and reflexes normal throughout. No disturbance  in gait. No tremor.  Skin: Intact, no ulceration, erythema , scaling or rash noted. Pigmentation normal throughout  Psych; Normal mood and affect. Judgement and concentration normal   Assessment & Plan:  Annual physical exam Annual exam as documented. Counseling done  re healthy lifestyle involving commitment to 150 minutes exercise per week, heart healthy diet, and attaining healthy weight.The importance of adequate sleep also discussed. Immunization and cancer screening needs are specifically addressed at this visit.   Infected cyst of Bartholin's gland duct Antibiotic prescribed  Obesity Deteriorated. Patient re-educated about  the importance of commitment to a  minimum of 150 minutes of exercise per week.  The importance of healthy food choices with portion control discussed. Encouraged to start a food diary, count calories and to consider  joining a support group. Sample diet sheets offered. Goals set by the patient for the next several months.   Weight /BMI 12/25/2017 01/02/2017 12/25/2016  WEIGHT 288 lb 233 lb 225 lb  HEIGHT 5\' 7"  - 5\' 6"   BMI 45.11 kg/m2 37.61 kg/m2 36.32 kg/m2      Seasonal allergies Uncontrolled with cough medication prescribed  Elevated blood-pressure reading without diagnosis of hypertension Significant weight re gain unfortunately with elevated blood pressure. Needs to work on lifestyle change to lose weight  And normalize blood pressure  Return in 6 months DASH diet and commitment to daily physical activity for a minimum of 30 minutes discussed and encouraged, as a part of hypertension management. The importance of attaining a healthy weight is also discussed.  BP/Weight 12/25/2017 01/02/2017 12/25/2016 11/12/2016 11/02/2016  07/02/2016 06/12/3663  Systolic BP 403 474 259 563 875 643 329  Diastolic BP 90 72 94 88 77 84 83  Wt. (Lbs) 288 233 225 224 225 222.4 170  BMI 45.11 37.61 36.32 35.08 36.32 34.83 26.62

## 2017-12-26 NOTE — Assessment & Plan Note (Signed)
Annual exam as documented. Counseling done  re healthy lifestyle involving commitment to 150 minutes exercise per week, heart healthy diet, and attaining healthy weight.The importance of adequate sleep also discussed.  Immunization and cancer screening needs are specifically addressed at this visit.  

## 2017-12-26 NOTE — Assessment & Plan Note (Signed)
Uncontrolled with cough medication prescribed

## 2017-12-26 NOTE — Assessment & Plan Note (Signed)
Significant weight re gain unfortunately with elevated blood pressure. Needs to work on lifestyle change to lose weight  And normalize blood pressure  Return in 6 months DASH diet and commitment to daily physical activity for a minimum of 30 minutes discussed and encouraged, as a part of hypertension management. The importance of attaining a healthy weight is also discussed.  BP/Weight 12/25/2017 01/02/2017 12/25/2016 11/12/2016 11/02/2016 07/02/2016 4/88/8916  Systolic BP 945 038 882 800 349 179 150  Diastolic BP 90 72 94 88 77 84 83  Wt. (Lbs) 288 233 225 224 225 222.4 170  BMI 45.11 37.61 36.32 35.08 36.32 34.83 26.62

## 2017-12-29 LAB — COMPLETE METABOLIC PANEL WITH GFR
AG Ratio: 1.2 (calc) (ref 1.0–2.5)
ALBUMIN MSPROF: 3.8 g/dL (ref 3.6–5.1)
ALT: 11 U/L (ref 6–29)
AST: 17 U/L (ref 10–35)
Alkaline phosphatase (APISO): 60 U/L (ref 33–115)
BUN: 8 mg/dL (ref 7–25)
CALCIUM: 8.9 mg/dL (ref 8.6–10.2)
CO2: 27 mmol/L (ref 20–32)
Chloride: 104 mmol/L (ref 98–110)
Creat: 0.88 mg/dL (ref 0.50–1.10)
GFR, EST NON AFRICAN AMERICAN: 77 mL/min/{1.73_m2} (ref >=60)
GFR, Est African American: 89 mL/min/{1.73_m2} (ref >=60)
GLOBULIN: 3.1 g/dL (ref 1.9–3.7)
GLUCOSE: 84 mg/dL (ref 65–99)
Potassium: 4.2 mmol/L (ref 3.5–5.3)
SODIUM: 138 mmol/L (ref 135–146)
TOTAL PROTEIN: 6.9 g/dL (ref 6.1–8.1)
Total Bilirubin: 0.6 mg/dL (ref 0.2–1.2)

## 2017-12-29 LAB — FERRITIN: Ferritin: 7 ng/mL — ABNORMAL LOW (ref 10–232)

## 2017-12-29 LAB — CBC
HEMATOCRIT: 33.2 % — AB (ref 35.0–45.0)
HEMOGLOBIN: 10.4 g/dL — AB (ref 11.7–15.5)
MCH: 26.1 pg — ABNORMAL LOW (ref 27.0–33.0)
MCHC: 31.3 g/dL — AB (ref 32.0–36.0)
MCV: 83.4 fL (ref 80.0–100.0)
MPV: 10.4 fL (ref 7.5–12.5)
Platelets: 270 10*3/uL (ref 140–400)
RBC: 3.98 10*6/uL (ref 3.80–5.10)
RDW: 14.1 % (ref 11.0–15.0)
WBC: 4 10*3/uL (ref 3.8–10.8)

## 2017-12-29 LAB — LIPID PANEL
CHOL/HDL RATIO: 2 (calc) (ref <5.0)
Cholesterol: 166 mg/dL (ref <200)
HDL: 83 mg/dL (ref >50)
LDL CHOLESTEROL (CALC): 71 mg/dL
NON-HDL CHOLESTEROL (CALC): 83 mg/dL (ref <130)
Triglycerides: 50 mg/dL (ref <150)

## 2017-12-29 LAB — IRON: Iron: 33 ug/dL — ABNORMAL LOW (ref 40–190)

## 2017-12-29 LAB — FOLATE: Folate: 12.6 ng/mL

## 2017-12-29 LAB — VITAMIN B12: VITAMIN B 12: 225 pg/mL (ref 200–1100)

## 2017-12-29 LAB — TSH: TSH: 0.73 m[IU]/L

## 2017-12-29 LAB — VITAMIN D 25 HYDROXY (VIT D DEFICIENCY, FRACTURES): VIT D 25 HYDROXY: 20 ng/mL — AB (ref 30–100)

## 2017-12-30 ENCOUNTER — Encounter: Payer: Self-pay | Admitting: Family Medicine

## 2017-12-31 LAB — CYTOLOGY - PAP
Diagnosis: NEGATIVE
HPV (WINDOPATH): NOT DETECTED

## 2018-02-05 ENCOUNTER — Encounter: Payer: Managed Care, Other (non HMO) | Admitting: Family Medicine

## 2018-05-11 IMAGING — DX DG HIP (WITH OR WITHOUT PELVIS) 2-3V*L*
3 series · 3 of 3 positions shown · non-contrast
Comparison: None.

CLINICAL DATA: Left hip pain for 2 months.

EXAM:
DG HIP (WITH OR WITHOUT PELVIS) 2-3V LEFT

[pelvis ap]
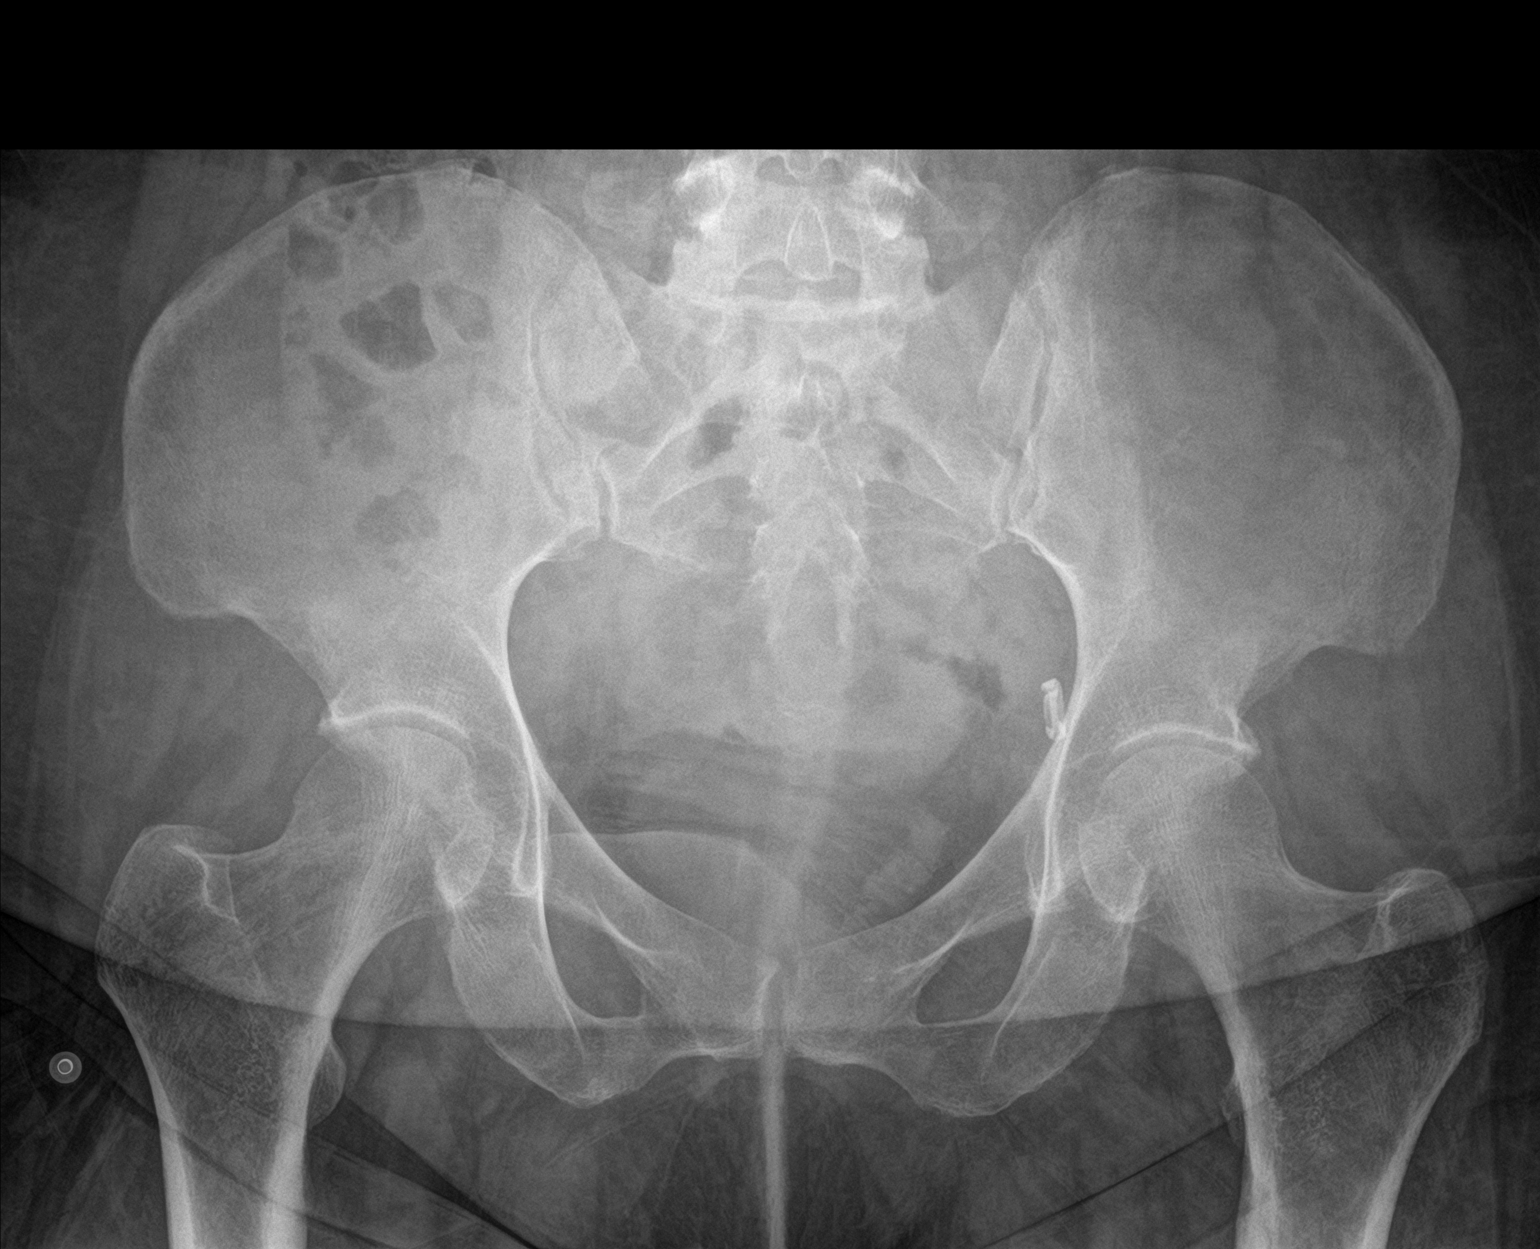

[hip ap]
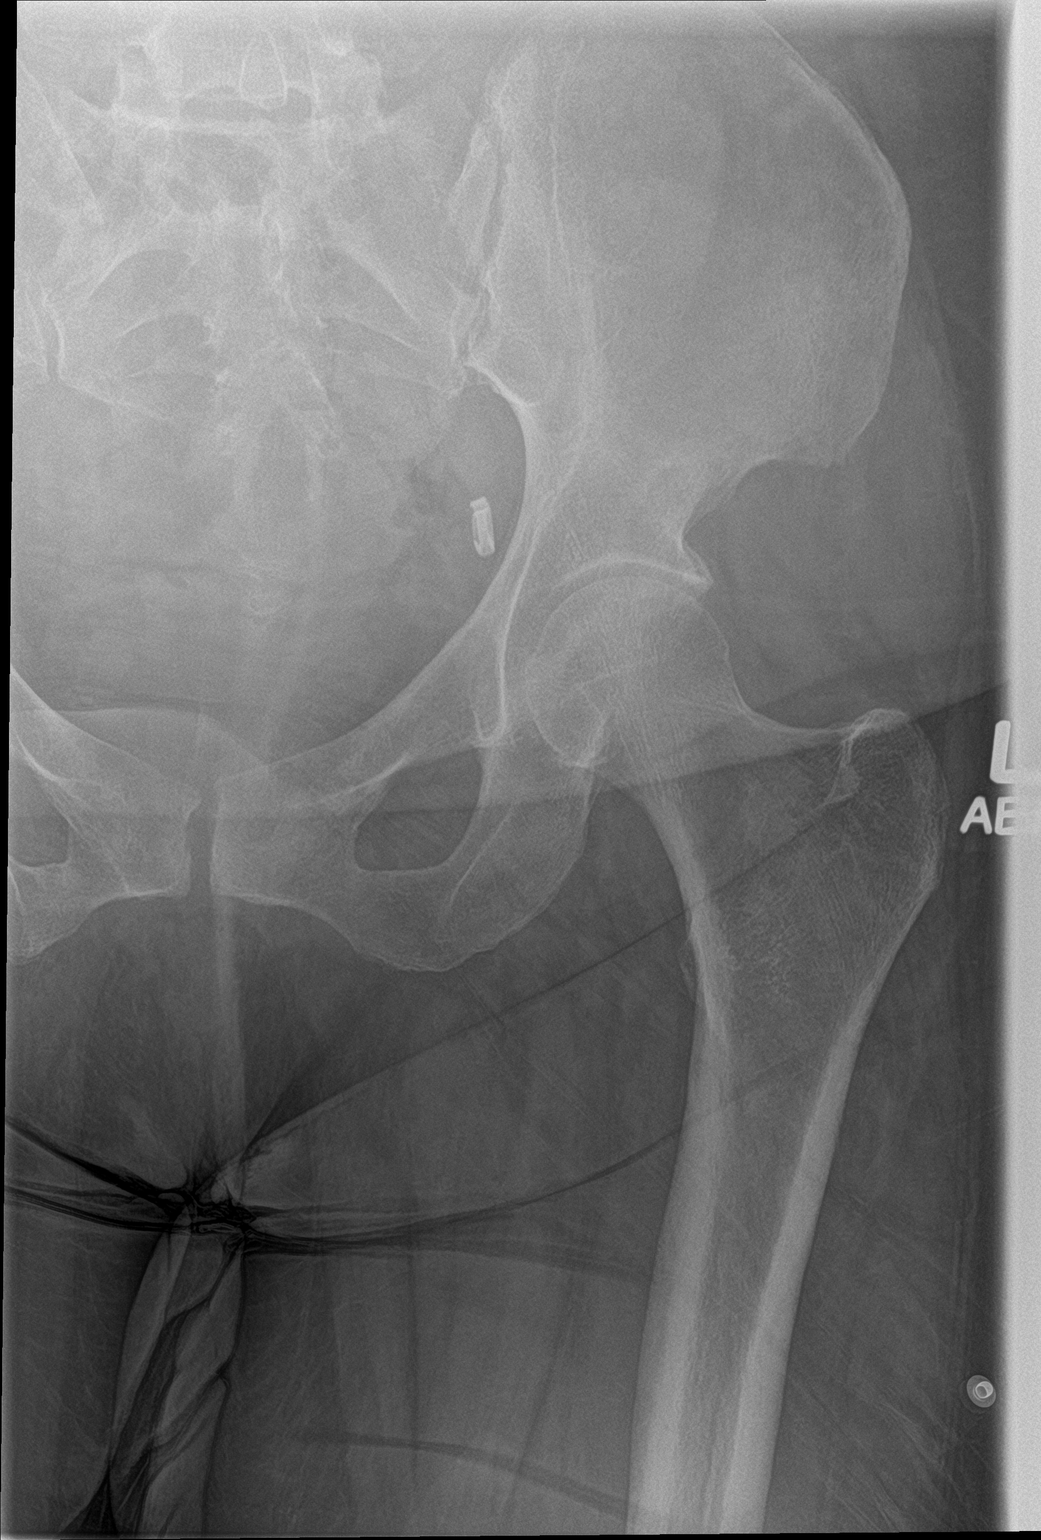

[hip lat]
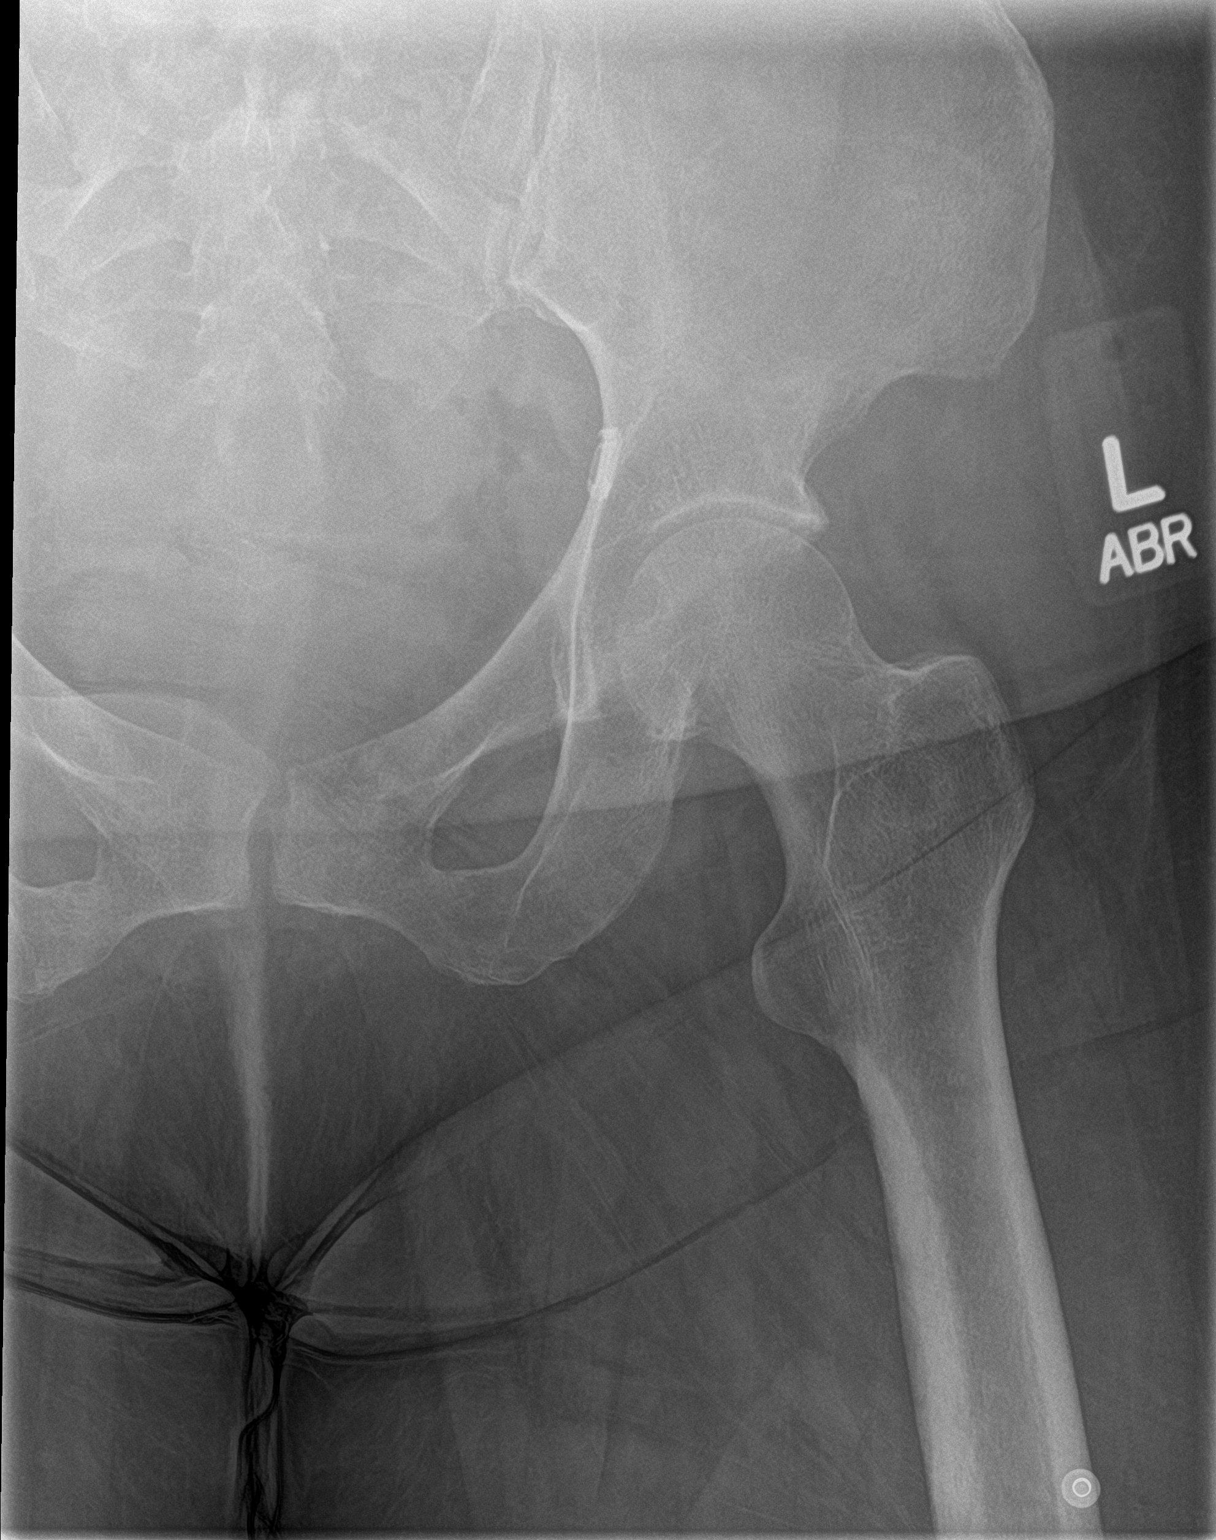

[3 of 3 positions shown; findings below may reference images not displayed]

FINDINGS: There is no evidence of hip fracture or dislocation. There is no
evidence of arthropathy or other focal bone abnormality.
IMPRESSION: Negative.

## 2018-07-07 ENCOUNTER — Ambulatory Visit: Payer: Managed Care, Other (non HMO) | Admitting: Family Medicine

## 2018-07-09 ENCOUNTER — Ambulatory Visit (INDEPENDENT_AMBULATORY_CARE_PROVIDER_SITE_OTHER): Payer: Managed Care, Other (non HMO) | Admitting: Family Medicine

## 2018-07-09 ENCOUNTER — Encounter: Payer: Self-pay | Admitting: Family Medicine

## 2018-07-09 VITALS — BP 140/90 | HR 93 | Resp 16 | Ht 67.0 in | Wt 241.0 lb

## 2018-07-09 DIAGNOSIS — R030 Elevated blood-pressure reading, without diagnosis of hypertension: Secondary | ICD-10-CM

## 2018-07-09 DIAGNOSIS — D508 Other iron deficiency anemias: Secondary | ICD-10-CM

## 2018-07-09 DIAGNOSIS — F322 Major depressive disorder, single episode, severe without psychotic features: Secondary | ICD-10-CM

## 2018-07-09 DIAGNOSIS — Z23 Encounter for immunization: Secondary | ICD-10-CM | POA: Diagnosis not present

## 2018-07-09 DIAGNOSIS — F411 Generalized anxiety disorder: Secondary | ICD-10-CM | POA: Diagnosis not present

## 2018-07-09 DIAGNOSIS — Z8371 Family history of colonic polyps: Secondary | ICD-10-CM

## 2018-07-09 MED ORDER — TEMAZEPAM 15 MG PO CAPS: 15.0000 mg | ORAL_CAPSULE | Freq: Every evening | ORAL | 0 refills | Status: DC | PRN

## 2018-07-09 MED ORDER — AMLODIPINE BESYLATE 2.5 MG PO TABS: 2.5000 mg | ORAL_TABLET | Freq: Every day | ORAL | 3 refills | Status: DC

## 2018-07-09 MED ORDER — FLUOXETINE HCL 20 MG PO TABS: 20.0000 mg | ORAL_TABLET | Freq: Every day | ORAL | 3 refills | Status: DC

## 2018-07-09 NOTE — Patient Instructions (Addendum)
Please schedule earliest  morning appt for patient' mammogram and provide this at checkout  F/U in 6 to 8 weeks  CBC , iron and ferritin , chem  7 and EGFR fasting tomorrow New medication for sleep, depression and anxiety  You are refered to tele psychiatry will be called within the next 1 week, on your cell # [phone]   You need to take iron 325 mg one twice daily, use stool softener 2 daily (colace) and OTC laxative every 4 3 days if you need me c a laxative,    You are being referred to GI urgently for evaluation of your iron deficiency ( Dr Rourke_) Work excuse to return 8/23 to 07/21/2018  New for blood pressure is amlodipine 1 daily

## 2018-07-10 ENCOUNTER — Telehealth: Payer: Self-pay

## 2018-07-10 DIAGNOSIS — F322 Major depressive disorder, single episode, severe without psychotic features: Secondary | ICD-10-CM | POA: Insufficient documentation

## 2018-07-10 DIAGNOSIS — Z23 Encounter for immunization: Secondary | ICD-10-CM | POA: Insufficient documentation

## 2018-07-10 DIAGNOSIS — F411 Generalized anxiety disorder: Secondary | ICD-10-CM | POA: Insufficient documentation

## 2018-07-10 NOTE — Assessment & Plan Note (Signed)
Low iron and ferritin , pt has no menses, needs gI evaluation asap, advised to take 2 iron tabs 325 mg daily, use stool softener daily and laxative as needed

## 2018-07-10 NOTE — Assessment & Plan Note (Signed)
Deteriorated. Patient re-educated about  the importance of commitment to a  minimum of 150 minutes of exercise per week.  The importance of healthy food choices with portion control discussed. Encouraged to start a food diary, count calories and to consider  joining a support group. Sample diet sheets offered. Goals set by the patient for the next several months.   Weight /BMI 07/09/2018 12/25/2017 01/02/2017  WEIGHT 241 lb 228 lb 233 lb  HEIGHT 5\' 7"  5\' 7"  -  BMI 37.75 kg/m2 35.71 kg/m2 37.61 kg/m2

## 2018-07-10 NOTE — Assessment & Plan Note (Signed)
Score of 15, start prozac and also restoril at night for sleep , refer to behavioral health

## 2018-07-10 NOTE — Progress Notes (Signed)
ANALIZ TVEDT     MRN: [id number]      DOB: [birth year]   HPI Olivia Woodward is here for follow up and re-evaluation of chronic medical conditions, medication management and review of any available recent lab and radiology data.  Preventive health is updated, specifically  Cancer screening and Immunization.   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 Denies sinus pressure, nasal congestion, ear pain or sore throat. Denies chest congestion, productive cough or wheezing. Denies chest pains, palpitations and leg swelling Denies abdominal pain, nausea, vomiting,diarrhea or constipation.   Denies dysuria, frequency, hesitancy or incontinence. Denies joint pain, swelling and limitation in mobility. Denies skin break down or rash.   PE  BP 140/90   Pulse 93   Resp 16   Ht 5\' 7"  (1.702 m)   Wt 241 lb (109.3 kg)   SpO2 98%   BMI 37.75 kg/m   Patient alert and oriented and in no cardiopulmonary distress.  HEENT: No facial asymmetry, EOMI,   oropharynx pink and moist.  Neck supple no JVD, no mass.  Chest: Clear to auscultation bilaterally.  CVS: S1, S2 no murmurs, no S3.Regular rate.  ABD: Soft non tender.   Ext: No edema  MS: Adequate ROM spine, shoulders, hips and knees.  Skin: Intact, no ulcerations or rash noted.  Psych: Good eye contact, flat affect. Memory intact both  anxious and  depressed appearing.  CNS: CN 2-12 intact, power,  normal throughout.no  focal deficits noted.   Assessment & Plan  Depression, major, single episode, severe (HCC) Start fluoxetine 20 mg daily and refer to behavioral health  Elevated blood-pressure reading without diagnosis of hypertension Uncontrolled, start amlodipine 2.5 mg daily Work excuse x 1 week DASH diet and commitment to daily physical activity for a minimum of 30 minutes discussed and encouraged, as a part of hypertension management. The importance of attaining a healthy weight is also discussed.  BP/Weight 07/09/2018 12/25/2017 01/02/2017 12/25/2016 11/12/2016 11/02/2016 0/17/5102  Systolic BP 585 277 824 235 361 443 154  Diastolic BP 90 90 72 94 88 77 84  Wt. (Lbs) 241 228 233 225 224 225 222.4  BMI 37.75 35.71 37.61 36.32 35.08 36.32 34.83       Morbid obesity (HCC) Deteriorated. Patient re-educated about  the importance of commitment to a  minimum of 150 minutes of exercise per week.  The importance of healthy food choices with portion control discussed. Encouraged to start a food diary, count calories and to consider  joining a support group. Sample diet sheets offered. Goals set by the patient for the next several months.   Weight /BMI 07/09/2018 12/25/2017 01/02/2017  WEIGHT 241 lb 228 lb 233 lb  HEIGHT 5\' 7"  5\' 7"  -  BMI 37.75 kg/m2 35.71 kg/m2 37.61 kg/m2      IRON DEFIC ANEMIA SEC DIET IRON INTAKE Low iron and ferritin , pt has no menses, needs gI evaluation asap, advised to take 2 iron tabs 325 mg daily,  use stool softener daily and laxative as needed  GAD (generalized anxiety disorder) Score of 15, start prozac and also restoril at night for sleep , refer to behavioral health  Need for influenza vaccination After obtaining informed consent, the vaccine is  administered by LPN.

## 2018-07-10 NOTE — Telephone Encounter (Signed)
[facility] - Left Msg 

## 2018-07-10 NOTE — Assessment & Plan Note (Signed)
After obtaining informed consent, the vaccine is  administered by LPN.  

## 2018-07-10 NOTE — Assessment & Plan Note (Signed)
Uncontrolled, start amlodipine 2.5 mg daily Work excuse x 1 week DASH diet and commitment to daily physical activity for a minimum of 30 minutes discussed and encouraged, as a part of hypertension management. The importance of attaining a healthy weight is also discussed.  BP/Weight 07/09/2018 12/25/2017 01/02/2017 12/25/2016 11/12/2016 11/02/2016 8/54/8830  Systolic BP 141 597 331 250 871 994 129  Diastolic BP 90 90 72 94 88 77 84  Wt. (Lbs) 241 228 233 225 224 225 222.4  BMI 37.75 35.71 37.61 36.32 35.08 36.32 34.83

## 2018-07-10 NOTE — Assessment & Plan Note (Signed)
Start fluoxetine 20 mg daily and refer to behavioral health

## 2018-07-11 ENCOUNTER — Encounter: Payer: Self-pay | Admitting: Family Medicine

## 2018-07-11 LAB — CBC
HCT: 30.1 % — ABNORMAL LOW (ref 35.0–45.0)
HEMOGLOBIN: 9.1 g/dL — AB (ref 11.7–15.5)
MCH: 24.7 pg — AB (ref 27.0–33.0)
MCHC: 30.2 g/dL — ABNORMAL LOW (ref 32.0–36.0)
MCV: 81.8 fL (ref 80.0–100.0)
MPV: 9.5 fL (ref 7.5–12.5)
PLATELETS: 283 10*3/uL (ref 140–400)
RBC: 3.68 10*6/uL — AB (ref 3.80–5.10)
RDW: 14.4 % (ref 11.0–15.0)
WBC: 5.5 10*3/uL (ref 3.8–10.8)

## 2018-07-11 LAB — BASIC METABOLIC PANEL WITH GFR
BUN: 8 mg/dL (ref 7–25)
CALCIUM: 8.7 mg/dL (ref 8.6–10.2)
CO2: 28 mmol/L (ref 20–32)
Chloride: 107 mmol/L (ref 98–110)
Creat: 0.71 mg/dL (ref 0.50–1.10)
GFR, EST AFRICAN AMERICAN: 116 mL/min/{1.73_m2} (ref >=60)
GFR, Est Non African American: 100 mL/min/{1.73_m2} (ref >=60)
Glucose, Bld: 87 mg/dL (ref 65–99)
Potassium: 4.4 mmol/L (ref 3.5–5.3)
Sodium: 142 mmol/L (ref 135–146)

## 2018-07-11 LAB — IRON: IRON: 34 ug/dL — AB (ref 40–190)

## 2018-07-11 LAB — FERRITIN: Ferritin: 5 ng/mL — ABNORMAL LOW (ref 16–232)

## 2018-07-13 ENCOUNTER — Encounter: Payer: Self-pay | Admitting: Internal Medicine

## 2018-07-14 ENCOUNTER — Telehealth: Payer: Self-pay

## 2018-07-14 DIAGNOSIS — F411 Generalized anxiety disorder: Secondary | ICD-10-CM

## 2018-07-14 DIAGNOSIS — F322 Major depressive disorder, single episode, severe without psychotic features: Secondary | ICD-10-CM

## 2018-07-15 ENCOUNTER — Telehealth: Payer: Self-pay | Admitting: Family Medicine

## 2018-07-15 ENCOUNTER — Encounter (HOSPITAL_COMMUNITY): Payer: Self-pay

## 2018-07-15 ENCOUNTER — Ambulatory Visit (HOSPITAL_COMMUNITY)
Admission: RE | Admit: 2018-07-15 | Discharge: 2018-07-15 | Disposition: A | Discharge status: Home / Self Care | Payer: Managed Care, Other (non HMO) | Source: Ambulatory Visit | Attending: Family Medicine | Admitting: Family Medicine

## 2018-07-15 DIAGNOSIS — Z1231 Encounter for screening mammogram for malignant neoplasm of breast: Secondary | ICD-10-CM | POA: Insufficient documentation

## 2018-07-15 NOTE — Telephone Encounter (Signed)
Called to confirm that pt has had behavioral health consult and no adverse effects from new meds, and feels able to return to work with no restrictions as originally planned on 07/21/2018. States much better, still has health concerns to follow through on , but no need to come in sooner than her October visit Form sent to her job

## 2018-07-16 ENCOUNTER — Telehealth: Payer: Self-pay | Admitting: Family Medicine

## 2018-07-16 ENCOUNTER — Telehealth: Payer: Self-pay

## 2018-07-16 DIAGNOSIS — F322 Major depressive disorder, single episode, severe without psychotic features: Secondary | ICD-10-CM

## 2018-07-16 NOTE — BH Specialist Note (Signed)
Error in charting.
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
## 2018-07-16 NOTE — Telephone Encounter (Signed)
RECEIVED PPW OFF FAX TODAY FOR PATIENT.  COPIED SLEEVED PLACED IN [facility] OFFICE FOR CHARGES.

## 2018-07-21 DIAGNOSIS — R030 Elevated blood-pressure reading, without diagnosis of hypertension: Secondary | ICD-10-CM

## 2018-07-21 DIAGNOSIS — F322 Major depressive disorder, single episode, severe without psychotic features: Secondary | ICD-10-CM

## 2018-07-21 DIAGNOSIS — F411 Generalized anxiety disorder: Secondary | ICD-10-CM

## 2018-07-22 NOTE — Progress Notes (Signed)
Olivia Woodward is a 52 y.o. year old female with a history of depression, anxiety, hypertension. Worsening depression in the setting of loss of her grandmother in 03/2018. Fluoxetine, temazepam was started by PCP.   Recommendation - Continue fluoxetine for depression. Consider future uptitration as indicated - on temazepam 15 mg at night  - [place] specialist to provide supportive therapy

## 2018-07-23 ENCOUNTER — Telehealth: Payer: Self-pay | Admitting: Family Medicine

## 2018-07-23 ENCOUNTER — Encounter: Payer: Self-pay | Admitting: Family Medicine

## 2018-07-23 NOTE — Telephone Encounter (Signed)
Patient called to check the status of her paperwork. I let her know it was received 0/81/44, policy is 8-18 business days, and that we will call her when it is ready.

## 2018-07-26 ENCOUNTER — Encounter: Payer: Self-pay | Admitting: Family Medicine

## 2018-07-26 DIAGNOSIS — F4320 Adjustment disorder, unspecified: Secondary | ICD-10-CM | POA: Insufficient documentation

## 2018-07-26 DIAGNOSIS — F4321 Adjustment disorder with depressed mood: Secondary | ICD-10-CM | POA: Insufficient documentation

## 2018-07-27 ENCOUNTER — Telehealth: Payer: Self-pay | Admitting: Family Medicine

## 2018-07-27 NOTE — Telephone Encounter (Signed)
Called pt to advise that her Paperwork was complete, and that there was a $29.00 charge. Pt is Upset that she is being charged anything, and that Dr Moshe Cipro has never charged her. I advised that this was a [employer]. Pt advised that she would send a note to Dr Moshe Cipro.  I went ahead and faxed the form. Pt advised she would pay at next visit.

## 2018-08-04 ENCOUNTER — Telehealth: Payer: Self-pay

## 2018-08-04 DIAGNOSIS — F322 Major depressive disorder, single episode, severe without psychotic features: Secondary | ICD-10-CM

## 2018-08-04 DIAGNOSIS — F411 Generalized anxiety disorder: Secondary | ICD-10-CM

## 2018-08-05 ENCOUNTER — Encounter (HOSPITAL_COMMUNITY): Payer: Self-pay | Admitting: Psychiatry

## 2018-08-05 ENCOUNTER — Ambulatory Visit (INDEPENDENT_AMBULATORY_CARE_PROVIDER_SITE_OTHER): Payer: 59 | Admitting: Psychiatry

## 2018-08-05 DIAGNOSIS — F321 Major depressive disorder, single episode, moderate: Secondary | ICD-10-CM | POA: Diagnosis not present

## 2018-08-06 ENCOUNTER — Other Ambulatory Visit: Payer: Self-pay

## 2018-08-06 ENCOUNTER — Encounter: Payer: Self-pay | Admitting: Family Medicine

## 2018-08-06 MED ORDER — FLUOXETINE HCL 20 MG PO TABS: 20.0000 mg | ORAL_TABLET | Freq: Every day | ORAL | 3 refills | Status: DC

## 2018-08-06 MED ORDER — TEMAZEPAM 15 MG PO CAPS: 15.0000 mg | ORAL_CAPSULE | Freq: Every evening | ORAL | 2 refills | Status: DC | PRN
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
## 2018-08-11 ENCOUNTER — Encounter (HOSPITAL_COMMUNITY): Payer: Self-pay

## 2018-08-12 NOTE — Progress Notes (Signed)
vBHI note. Patient was seen by Ms. Peggy for therapy. No change in medication recommendation. We will sign off at this time. Please contact us if any questions.

## 2018-08-12 NOTE — BH Specialist Note (Signed)
[place] Telephone Follow-up  MRN: [id number] NAME: Olivia Woodward Date: 08/12/18   Total time: 30 minutes Call number: 2/6  Reason for call today: Reason for Contact: PHQ9-8 weeks  PHQ-9 Scores:   Depression screen [facility] 2/9 2018/07/19 07/09/2018 07/09/2018 11/12/2016 07/02/2016  Decreased Interest 3 3 2  0 1  Down, Depressed, Hopeless 2 3 1  0 1  PHQ - 2 Score 5 6 3  0 2  Altered sleeping 2 3 3  - 3  Tired, decreased energy 2 3 3  - 3  Change in appetite 1 3 2  - 1  Feeling bad or failure about yourself  3 1 0 - 0  Trouble concentrating 2 3 1  - 0  Moving slowly or fidgety/restless 0 0 0 - 0  Suicidal thoughts 0 1 0 - 0  PHQ-9 Score 15 20 12  - 9  Difficult doing work/chores Very difficult - Very difficult - Somewhat difficult       GAD-7 Scores:  GAD 7 : Generalized Anxiety Score 08/12/2018 07/19/18 07/09/2018  Nervous, Anxious, on Edge 2 2 3   Control/stop worrying 2 2 3   Worry too much - different things 2 2 3   Trouble relaxing 1 2 3   Restless 2 1 0  Easily annoyed or irritable 1 1 3   Afraid - awful might happen 2 2 0  Total GAD 7 Score 12 12 15   Anxiety Difficulty Somewhat difficult Somewhat difficult Somewhat difficult        Stress Current stressors: Current Stressors: Family death(Medication is not helping her level of depression and anxiety. ) Sleep: Sleep: Decreased, Difficulty falling asleep, Difficulty staying asleep Appetite:   Coping ability:   Patient taking medications as prescribed:    Current medications:  Outpatient Encounter Medications as of 08/04/2018  Medication Sig  . amLODipine (NORVASC) 2.5 MG tablet Take 1 tablet (2.5 mg total) by mouth daily.  . [DISCONTINUED] FLUoxetine (PROZAC) 20 MG tablet Take 1 tablet (20 mg total) by mouth daily.  . [DISCONTINUED] temazepam (RESTORIL) 15 MG capsule Take 1 capsule (15 mg total) by mouth at bedtime as needed for sleep.   No facility-administered encounter medications on file as of 08/04/2018.       Self-harm Behaviors Risk Assessment Self-harm risk factors:   Patient endorses recent thoughts of harming self:    [name] Suicide Severity Rating Scale: No flowsheet data found. C-SRSS July 19, 2018  1. Wish to be Dead No  2. Suicidal Thoughts No  6. Suicide Behavior Question No     Danger to Others Risk Assessment Danger to others risk factors:  No Patient endorses recent thoughts of harming others:  No    Substance Use Assessment Patient recently consumed alcohol:  No  Alcohol Use Disorder Identification Test (AUDIT):  Alcohol Use Disorder Test (AUDIT) 2018/07/19  1. How often do you have a drink containing alcohol? 0  2. How many drinks containing alcohol do you have on a typical day when you are drinking? 0  3. How often do you have six or more drinks on one occasion? 0  AUDIT-C Score 0  Intervention/Follow-up AUDIT Score <7 follow-up not indicated   Patient recently used drugs:  No    Goals, Interventions and Follow-up Plan Goals: Increase healthy adjustment to current life circumstances Interventions: Link to [employer] Follow-up Plan: [facility] Phone Follow Up   Summary:   Follow up -    Patient reports that the psychiatric medications is not working for her.  Patient reports that she has been  taking them for almost 30 days.  Patient requests face to face outpatient therapy.  Patient reports anxiety and crying spells.    Patient receives psychiatric medication from her PCP.    Writer scheduled a patient with an appt with Peggy on 08-05-2018.  Patient denies SI/HI/Psychosis/Substance Abuse. If your symptoms worsen or you have thoughts of suicide/homicide, PLEASE SEEK IMMEDIATE MEDICAL ATTENTION.  You may always call:  National Suicide Hotline: 947-139-8318;  [place] Crisis Line: 779-709-0843;  Crisis Recovery in [place]: 309-338-6153.  These are available 24 hours a day, 7 days a week.     Graciella Freer LaVerne, LCAS-A

## 2018-08-18 ENCOUNTER — Telehealth: Payer: Self-pay

## 2018-08-18 NOTE — Telephone Encounter (Signed)
vBHI note. Patient was seen by Ms. Peggy for therapy. No change in medication recommendation. We will sign off at this time. Please contact us if any questions, per [name].

## 2018-08-26 ENCOUNTER — Telehealth: Payer: Self-pay | Admitting: Family Medicine

## 2018-08-26 ENCOUNTER — Ambulatory Visit: Payer: Managed Care, Other (non HMO) | Admitting: Family Medicine

## 2018-08-26 NOTE — Telephone Encounter (Signed)
Left voicemail on cell phone before lunch to r/s appt. Called home phone at 1:43pm to speak to patient & mom answered the phone, let her know Dr.Simpson would not be here this afternoon.

## 2018-09-04 NOTE — Progress Notes (Signed)
Psychiatric Initial Adult Assessment   Patient Identification: Olivia Woodward MRN:  [id number] Date of Evaluation:  09/08/2018 Referral Source: [name], LCSW Chief Complaint:   Chief Complaint    Psychiatric Evaluation; Depression    "Just life in general" Visit Diagnosis:    ICD-10-CM   1. MDD (major depressive disorder), recurrent episode, moderate (HCC) F33.1     History of Present Illness:   Olivia Woodward is a 52 y.o. year old female with a history of depression, anxiety, hypertension, who is referred for depression.   Patient states that she has been feeling very overwhelmed by "life in general."  She talks about her son, age 50 who is in college. She is proud of her son, stating that he was the first one to go to college in her family. She is very worried that he may go to parties.  She was raised in a religious family,  stating that "we don't do party, we go to church." She did not go to movie as it was considered "sin." Although she did allow children to enjoy those, she does not want her son to go to parties as there are drugs.  Although she has never be interested in doing drug, her sister went to a different route and abused weed in the past. She is also worried about her daughter, age 78. She wonders her daughter may be a gay.  Although she does not think she can control everything or they can have "perfect life," she is worried about them. She also talks about loss of her maternal grandmother, who deceased at age 58. She grieves, although her grandmother deceased "too long ago." She feels frustrated at work.  Although they work like a family, there has been a lot of arguing and fussing from customers. She thinks that "there is more important things in life than blind."She wants to get away from all of things.    She complains of insomnia.  She has significant fatigue and difficulty in concentration. She has crying spells. She has appetite loss.  Although she does have  thoughts of death, she adamantly denies any plans or intent. She feels anxious, and has panic attacks. She denies alcohol use, drug use. She does bible study and goes to church regularly. She has not noticed any change after starting fluoxetine.   Per PMP,  Temazepam filled on 08/15/2018   Associated Signs/Symptoms: Depression Symptoms:  depressed mood, anhedonia, insomnia, fatigue, recurrent thoughts of death, anxiety, (Hypo) Manic Symptoms:  denies decreased need for sleep, euphoria Anxiety Symptoms:  Excessive Worry, Panic Symptoms, Psychotic Symptoms:  denies AH, VH PTSD Symptoms: Negative  Past Psychiatric History:  Outpatient: used to seen by [name] for depression in 2014 in the context of relationship issues Psychiatry admission: denies Previous suicide attempt: denies  Past trials of medication: fluoxetine, lexapro, Xanax, temazepam,  History of violence: denies  Previous Psychotropic Medications: Yes   Substance Abuse History in the last 12 months:  No.  Consequences of Substance Abuse: NA  Past Medical History:  Past Medical History:  Diagnosis Date  . Anemia   . Generalized headaches   . Hypertension     Past Surgical History:  Procedure Laterality Date  . ABDOMINAL HYSTERECTOMY     fibroids, partial  . BREAST REDUCTION SURGERY  2005  . GASTRIC BYPASS  2007    Family Psychiatric History:  Sister- drug use  Family History:  Family History  Problem Relation Age of Onset  . Hypertension  Mother   . Diabetes Mother   . Hypertension Father   . Hypertension Sister   . Hypertension Sister     Social History:   Social History   Socioeconomic History  . Marital status: Single    Spouse name: Not on file  . Number of children: Not on file  . Years of education: Not on file  . Highest education level: Not on file  Occupational History  . Not on file  Social Needs  . Financial resource strain: Not on file  . Food insecurity:    Worry: Not on  file    Inability: Not on file  . Transportation needs:    Medical: Not on file    Non-medical: Not on file  Tobacco Use  . Smoking status: Never Smoker  . Smokeless tobacco: Never Used  Substance and Sexual Activity  . Alcohol use: No  . Drug use: No  . Sexual activity: Yes    Birth control/protection: Condom  Lifestyle  . Physical activity:    Days per week: Not on file    Minutes per session: Not on file  . Stress: Not on file  Relationships  . Social connections:    Talks on phone: Not on file    Gets together: Not on file    Attends religious service: Not on file    Active member of club or organization: Not on file    Attends meetings of clubs or organizations: Not on file    Relationship status: Not on file  Other Topics Concern  . Not on file  Social History Narrative  . Not on file    Additional Social History:  She lives with her two children (age 68,13), single, never been married,  She was born in [place]. She "never felt close connection" with her mother. She was "the bad kid." She had good relationship with her father. She has two sisters, one brother.  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 Ketorolac Tromethamine Hives    Lips swelled  . Morphine And Related Itching    Metabolic Disorder Labs: Lab Results  Component Value Date   HGBA1C 5.3 07/26/2010   No results found for: PROLACTIN Lab Results  Component Value Date   CHOL 166 12/27/2017   TRIG 50 12/27/2017   HDL 83 12/27/2017   CHOLHDL 2.0 12/27/2017   VLDL 10 01/02/2017   LDLCALC 71 12/27/2017   LDLCALC 74 01/02/2017     Current Medications: Current Outpatient Medications  Medication Sig Dispense Refill  . amLODipine (NORVASC) 2.5 MG tablet Take 1 tablet (2.5 mg total) by mouth daily. 30 tablet 3  . FLUoxetine (PROZAC)  20 MG tablet Take 1 tablet (20 mg total) by mouth daily. 30 tablet 3  . temazepam (RESTORIL) 15 MG capsule Take 1 capsule (15 mg total) by mouth at bedtime as needed for sleep. 30 capsule 2   No current facility-administered medications for this visit.     Neurologic: Headache: No Seizure: No Paresthesias:No  Musculoskeletal: Strength & Muscle Tone: within normal limits Gait & Station: normal Patient leans: N/A  Psychiatric Specialty Exam: Review of Systems  Psychiatric/Behavioral: Positive for depression and suicidal ideas. Negative for hallucinations, memory loss and substance abuse. The patient is nervous/anxious and has insomnia.   All other systems reviewed and are negative.   Blood pressure 130/87,  pulse 86, height 5\' 7"  (1.702 m), weight 236 lb (107 kg), SpO2 99 %.Body mass index is 36.96 kg/m.  General Appearance: Fairly Groomed  Eye Contact:  Good  Speech:  Clear and Coherent  Volume:  Normal  Mood:  Depressed  Affect:  Appropriate, Congruent, Tearful and down, restricted  Thought Process:  Coherent  Orientation:  Full (Time, Place, and Person)  Thought Content:  Logical  Suicidal Thoughts:  Yes.  without intent/plan  Homicidal Thoughts:  No  Memory:  Immediate;   Good  Judgement:  Good  Insight:  Fair  Psychomotor Activity:  Normal  Concentration:  Concentration: Good and Attention Span: Good  Recall:  Good  Fund of Knowledge:Good  Language: Good  Akathisia:  No  Handed:  Right  AIMS (if indicated):  N/A  Assets:  Communication Skills Desire for Improvement  ADL's:  Intact  Cognition: WNL  Sleep:  poor   Assessment Olivia Woodward is a 52 y.o. year old female with a history of depression, anxiety, hypertension, s/p gastric bypass surgery in 2017, who is referred for depression.   # MDD, moderate, recurrent with anxious distress Patient endorses depressive symptoms with significant anxiety, which has worsened over the past year.  Psychosocial stressors  including parenting her children, and loss of her maternal grandmother.  Will uptitrate fluoxetine to target depression and anxiety.  Will switch from temazepam to trazodone as needed for insomnia given patient reports limited benefit from temazepam.  Discussed cognitive diffusion.  She is encouraged to continue to see a therapist.   Plan 1. Increase fluoxetine 40 mg daily  2. Discontinue Temazepam 3. Continue Trazodone 25-50 mg at night as needed for sleep 4. Return to clinic in one month for 30 mins   The patient demonstrates the following risk factors for suicide: Chronic risk factors for suicide include: psychiatric disorder of depression. Acute risk factors for suicide include: loss (financial, interpersonal, professional). Protective factors for this patient include: responsibility to others (children, family), coping skills, hope for the future and religious beliefs against suicide. Considering these factors, the overall suicide risk at this point appears to be low. Patient is appropriate for outpatient follow up.   Treatment Plan Summary: Plan as above   Norman Clay, MD 10/22/20198:34 AM

## 2018-09-07 ENCOUNTER — Ambulatory Visit (HOSPITAL_COMMUNITY): Payer: Self-pay | Admitting: Psychiatry

## 2018-09-08 ENCOUNTER — Encounter (HOSPITAL_COMMUNITY): Payer: Self-pay | Admitting: Psychiatry

## 2018-09-08 ENCOUNTER — Ambulatory Visit (INDEPENDENT_AMBULATORY_CARE_PROVIDER_SITE_OTHER): Payer: 59 | Admitting: Psychiatry

## 2018-09-08 VITALS — BP 130/87 | HR 86 | Ht 67.0 in | Wt 236.0 lb

## 2018-09-08 DIAGNOSIS — F410 Panic disorder [episodic paroxysmal anxiety] without agoraphobia: Secondary | ICD-10-CM

## 2018-09-08 DIAGNOSIS — R45851 Suicidal ideations: Secondary | ICD-10-CM

## 2018-09-08 DIAGNOSIS — F419 Anxiety disorder, unspecified: Secondary | ICD-10-CM | POA: Diagnosis not present

## 2018-09-08 DIAGNOSIS — G4700 Insomnia, unspecified: Secondary | ICD-10-CM | POA: Diagnosis not present

## 2018-09-08 DIAGNOSIS — F331 Major depressive disorder, recurrent, moderate: Secondary | ICD-10-CM | POA: Diagnosis not present

## 2018-09-08 MED ORDER — FLUOXETINE HCL 40 MG PO CAPS: 40.0000 mg | ORAL_CAPSULE | Freq: Every day | ORAL | 0 refills | Status: DC

## 2018-09-08 MED ORDER — TRAZODONE HCL 50 MG PO TABS: ORAL_TABLET | ORAL | 0 refills | Status: DC

## 2018-09-08 NOTE — Patient Instructions (Signed)
1. Increase fluoxetine 40 mg daily  2. Discontinue Temazepam 3. Continue Trazodone 25-50 mg at night as needed for sleep 4. Return to clinic in one month for 30 mins

## 2018-09-09 ENCOUNTER — Telehealth (HOSPITAL_COMMUNITY): Payer: Self-pay | Admitting: *Deleted

## 2018-09-09 ENCOUNTER — Telehealth (HOSPITAL_COMMUNITY): Payer: Self-pay | Admitting: Psychiatry

## 2018-09-09 MED ORDER — SERTRALINE HCL 50 MG PO TABS: ORAL_TABLET | ORAL | 0 refills | Status: DC

## 2018-09-09 NOTE — Telephone Encounter (Signed)
Discussed with the patient. She felt nauseated, jittery after increasing fluoxetine. She would like to get back on Xanax, which worked before. Discussed with the patient that it is not recommended at this time as it would not treat her underlying condition. The patient states that "then don't ask me, just tell me what to do, I will do it." Discussed starting sertraline. Discussed potential side effects including nausea, drowsiness. (She will not be a good candidate for mirtazapine given her concern of weight gain.) She reports her frustration about potential side effect, while she agrees to start this medication. She hang up the phone in the middle of the conversation, stating that "Thank you for nothing."  - Discontinue fluoxetine.  - One week after discontinuing fluoxetine, start sertraline 25 mg daily for one week, then 50 mg daily

## 2018-09-09 NOTE — Telephone Encounter (Signed)
Dr Modesta Messing Patient states she think it's the Fluoxetine because she took it this morning  & the funny feeling & jitteriness started

## 2018-09-09 NOTE — Telephone Encounter (Signed)
Advise her to take fluoxetine only, and see if it helps.

## 2018-09-09 NOTE — Telephone Encounter (Signed)
Left voice message to contact the office for further information regarding her medication (She hang up the phone).   Please inform the patient to start sertraline after one week of discontinuing fluoxetine. Sertraline 25 mg daily for one week, then 50 mg daily.

## 2018-09-09 NOTE — Telephone Encounter (Signed)
Dr Modesta Messing Patient called in stating that she is @ work & she has taken the prescribed Prozac & Trazodone. And that " she feels funny  & jittery"

## 2018-09-14 ENCOUNTER — Ambulatory Visit: Payer: Managed Care, Other (non HMO) | Admitting: Gastroenterology

## 2018-09-14 ENCOUNTER — Encounter: Payer: Self-pay | Admitting: Internal Medicine

## 2018-09-16 ENCOUNTER — Ambulatory Visit (HOSPITAL_COMMUNITY): Payer: 59 | Admitting: Psychiatry

## 2018-09-30 ENCOUNTER — Ambulatory Visit (HOSPITAL_COMMUNITY): Payer: Self-pay | Admitting: Psychiatry

## 2018-10-01 NOTE — Progress Notes (Deleted)
BH MD/PA/NP OP Progress Note  10/01/2018 4:05 PM ADELAE YODICE  MRN:  [id number]  Chief Complaint:  HPI: *** Visit Diagnosis: No diagnosis found.  Past Psychiatric History: Please see initial evaluation for full details. I have reviewed the history. No updates at this time.     Past Medical History:  Past Medical History:  Diagnosis Date  . Anemia   . Generalized headaches   . Hypertension     Past Surgical History:  Procedure Laterality Date  . ABDOMINAL HYSTERECTOMY     fibroids, partial  . BREAST REDUCTION SURGERY  2005  . GASTRIC BYPASS  2007    Family Psychiatric History: Please see initial evaluation for full details. I have reviewed the history. No updates at this time.     Family History:  Family History  Problem Relation Age of Onset  . Hypertension Mother   . Diabetes Mother   . Drug abuse Mother   . Hypertension Father   . Hypertension Sister   . Hypertension Sister     Social History:  Social History   Socioeconomic History  . Marital status: Single    Spouse name: Not on file  . Number of children: Not on file  . Years of education: Not on file  . Highest education level: Not on file  Occupational History  . Not on file  Social Needs  . Financial resource strain: Not on file  . Food insecurity:    Worry: Not on file    Inability: Not on file  . Transportation needs:    Medical: Not on file    Non-medical: Not on file  Tobacco Use  . Smoking status: Never Smoker  . Smokeless tobacco: Never Used  Substance and Sexual Activity  . Alcohol use: No  . Drug use: No  . Sexual activity: Yes    Birth control/protection: Condom  Lifestyle  . Physical activity:    Days per week: Not on file    Minutes per session: Not on file  . Stress: Not on file  Relationships  . Social connections:    Talks on phone: Not on file    Gets together: Not on file    Attends religious service: Not on file    Active member of club or organization: Not on  file    Attends meetings of clubs or organizations: Not on file    Relationship status: Not on file  Other Topics Concern  . Not on file  Social History Narrative  . Not on file    Allergies:  Allergies  Allergen Reactions  . Ketorolac Tromethamine Hives    Lips swelled  . Morphine And Related Itching    Metabolic Disorder Labs: Lab Results  Component Value Date   HGBA1C 5.3 07/26/2010   No results found for: PROLACTIN Lab Results  Component Value Date   CHOL 166 12/27/2017   TRIG 50 12/27/2017   HDL 83 12/27/2017   CHOLHDL 2.0 12/27/2017   VLDL 10 01/02/2017   LDLCALC 71 12/27/2017   LDLCALC 74 01/02/2017   Lab Results  Component Value Date   TSH 0.73 12/27/2017   TSH 0.58 01/02/2017    Therapeutic Level Labs: No results found for: LITHIUM No results found for: VALPROATE No components found for:  CBMZ  Current Medications: Current Outpatient Medications  Medication Sig Dispense Refill  . amLODipine (NORVASC) 2.5 MG tablet Take 1 tablet (2.5 mg total) by mouth daily. 30 tablet 3  . sertraline (ZOLOFT) 50  MG tablet Please start one week after discontinuing fluoxetine. Start 25 mg daily for one week, then 50 mg daily. 30 tablet 0  . temazepam (RESTORIL) 15 MG capsule Take 1 capsule (15 mg total) by mouth at bedtime as needed for sleep. 30 capsule 2  . traZODone (DESYREL) 50 MG tablet 25-50 mg at night as needed for sleep 30 tablet 0   No current facility-administered medications for this visit.      Musculoskeletal: Strength & Muscle Tone: within normal limits Gait & Station: normal Patient leans: N/A  Psychiatric Specialty Exam: ROS  There were no vitals taken for this visit.There is no height or weight on file to calculate BMI.  General Appearance: Fairly Groomed  Eye Contact:  Good  Speech:  Clear and Coherent  Volume:  Normal  Mood:  {BHH MOOD:22306}  Affect:  {Affect (PAA):22687}  Thought Process:  Coherent  Orientation:  Full (Time, Place,  and Person)  Thought Content: Logical   Suicidal Thoughts:  {ST/HT (PAA):22692}  Homicidal Thoughts:  {ST/HT (PAA):22692}  Memory:  Immediate;   Good  Judgement:  {Judgement (PAA):22694}  Insight:  {Insight (PAA):22695}  Psychomotor Activity:  Normal  Concentration:  Concentration: Good and Attention Span: Good  Recall:  Good  Fund of Knowledge: Good  Language: Good  Akathisia:  No  Handed:  Right  AIMS (if indicated): not done  Assets:  Communication Skills Desire for Improvement  ADL's:  Intact  Cognition: WNL  Sleep:  {BHH GOOD/FAIR/POOR:22877}   Screenings: GAD-7     Virtual [facility] Phone Follow Up from 08/04/2018 in [facility] Phone Follow Up from 07/16/2018 in [facility] Office Visit from 07/09/2018 in [facility]  Total GAD-7 Score  12  12  15     PHQ2-9     Virtual [facility] Phone Follow Up from 08/04/2018 in [facility] Phone Follow Up from 07/16/2018 in [facility] Office Visit from 07/09/2018 in [facility] Office Visit from 11/12/2016 in [facility] Office Visit from 07/02/2016 in [facility]  PHQ-2 Total Score  6  5  6   0  2  PHQ-9 Total Score  15  15  20   -  9       Assessment and Plan:  DELCIA SPITZLEY is a 52 y.o. year old female with a history of depression, anxiety,hypertension, s/p gastric bypass surgery in 2017 , who presents for follow up appointment for No diagnosis found.  # MDD, moderate, recurrent with anxious distress  Patient endorses depressive symptoms with significant anxiety, which has worsened over the past year.  Psychosocial stressors including parenting her children, and loss of her maternal grandmother.  Will uptitrate fluoxetine to target depression and anxiety.  Will switch from temazepam to trazodone as needed for insomnia given patient reports limited benefit from temazepam.  Discussed cognitive diffusion.  She is encouraged to continue to  see a therapist.   Plan 1. Increase fluoxetine 40 mg daily  2. Discontinue Temazepam 3. Continue Trazodone 25-50 mg at night as needed for sleep 4. Return to clinic in one month for 30 mins   The patient demonstrates the following risk factors for suicide: Chronic risk factors for suicide include: psychiatric disorder of depression. Acute risk factors for suicide include: loss (financial, interpersonal, professional). Protective factors for this patient include: responsibility to others (children, family), coping skills, hope for the future and religious beliefs against suicide. Considering these factors, the overall suicide  risk at this point appears to be low. Patient is appropriate for outpatient follow up.   Norman Clay, MD 10/01/2018, 4:05 PM

## 2018-10-05 ENCOUNTER — Emergency Department (HOSPITAL_COMMUNITY)
Admission: EM | Admit: 2018-10-05 | Discharge: 2018-10-06 | Disposition: A | Discharge status: Home / Self Care | Payer: Self-pay | Attending: Emergency Medicine | Admitting: Emergency Medicine

## 2018-10-05 ENCOUNTER — Encounter (HOSPITAL_COMMUNITY): Payer: Self-pay

## 2018-10-05 ENCOUNTER — Emergency Department (HOSPITAL_COMMUNITY): Payer: Self-pay

## 2018-10-05 ENCOUNTER — Other Ambulatory Visit: Payer: Self-pay

## 2018-10-05 DIAGNOSIS — I10 Essential (primary) hypertension: Secondary | ICD-10-CM | POA: Insufficient documentation

## 2018-10-05 DIAGNOSIS — Z79899 Other long term (current) drug therapy: Secondary | ICD-10-CM | POA: Insufficient documentation

## 2018-10-05 DIAGNOSIS — R0789 Other chest pain: Secondary | ICD-10-CM | POA: Insufficient documentation

## 2018-10-05 LAB — I-STAT TROPONIN, ED: Troponin i, poc: 0 ng/mL (ref 0.00–0.08)

## 2018-10-05 LAB — CBC
HCT: 34.1 % — ABNORMAL LOW (ref 36.0–46.0)
Hemoglobin: 9.9 g/dL — ABNORMAL LOW (ref 12.0–15.0)
MCH: 25.5 pg — ABNORMAL LOW (ref 26.0–34.0)
MCHC: 29 g/dL — ABNORMAL LOW (ref 30.0–36.0)
MCV: 87.9 fL (ref 80.0–100.0)
Platelets: 304 10*3/uL (ref 150–400)
RBC: 3.88 MIL/uL (ref 3.87–5.11)
RDW: 16.1 % — ABNORMAL HIGH (ref 11.5–15.5)
WBC: 6.1 10*3/uL (ref 4.0–10.5)
nRBC: 0 % (ref 0.0–0.2)

## 2018-10-05 LAB — BASIC METABOLIC PANEL
Anion gap: 8 (ref 5–15)
BUN: 7 mg/dL (ref 6–20)
CO2: 22 mmol/L (ref 22–32)
Calcium: 8.3 mg/dL — ABNORMAL LOW (ref 8.9–10.3)
Chloride: 107 mmol/L (ref 98–111)
Creatinine, Ser: 0.68 mg/dL (ref 0.44–1.00)
GFR calc Af Amer: >60 mL/min (ref >60)
GFR calc non Af Amer: >60 mL/min (ref >60)
Glucose, Bld: 95 mg/dL (ref 70–99)
Potassium: 3.4 mmol/L — ABNORMAL LOW (ref 3.5–5.1)
Sodium: 137 mmol/L (ref 135–145)

## 2018-10-05 MED ORDER — MORPHINE SULFATE (PF) 4 MG/ML IV SOLN
4.0000 mg | Freq: Once | INTRAVENOUS | Status: AC
  Administered 2018-10-05: 4 mg via INTRAVENOUS
  Filled 2018-10-05: qty 1

## 2018-10-05 NOTE — ED Provider Notes (Signed)
[facility] EMERGENCY DEPARTMENT Provider Note   CSN: [id number] Arrival date & time: 10/05/18  2012     History   Chief Complaint Chief Complaint  Patient presents with  . Chest Pain    HPI Olivia Woodward is a 52 y.o. female with history of hypertension, iron deficiency anemia, and headaches presents for evaluation of acute onset, waxing and waning chest pain since around 6 PM today.  She states that the pain began suddenly while at work.  She noted a mild tightness to the chest.  While driving home she acutely began to feel worsening chest pressure radiating to her bilateral shoulders with associated nausea, dizziness, and lightheadedness.  Symptoms lasted for approximately 1.5 minutes before beginning to improve.  She denies shortness of breath, abdominal pain, vomiting, fevers, or cough.  No leg swelling, recent travel or surgeries, hemoptysis, or prior history of DVT or PE.  Not on hormone replacement therapy.  She is a non-smoker, denies recreational drug use, excessive alcohol intake, or excessive caffeine intake.  She went to urgent care who gave her 3 aspirin and she received 1 sublingual nitroglycerin via EMS without improvement in her symptoms. Blood pressure with urgent care was 160/110.  Pain is not exertional or pleuritic.  She has no significant family history of cardiac disease or stroke at a young age.  The history is provided by the patient.    Past Medical History:  Diagnosis Date  . Anemia   . Generalized headaches   . Hypertension     Patient Active Problem List   Diagnosis Date Noted  . MDD (major depressive disorder), recurrent episode, moderate ([place]) 09/08/2018  . Grief reaction 07/26/2018  . Depression, major, single episode, severe ([place]) 07/10/2018  . GAD (generalized anxiety disorder) 07/10/2018  . Need for influenza vaccination 07/10/2018  . Seasonal allergies 07/02/2016  . ANEMIA 07/25/2010  . FATIGUE 07/25/2010  . OTHER DRUG  ALLERGY 05/03/2010  . IRON DEFIC ANEMIA [place] DIET IRON INTAKE 02/09/2009  . Goiter, unspecified 11/17/2007  . Morbid obesity ([place]) 11/17/2007  . Essential hypertension 11/17/2007    Past Surgical History:  Procedure Laterality Date  . ABDOMINAL HYSTERECTOMY     fibroids, partial  . BREAST REDUCTION SURGERY  2005  . GASTRIC BYPASS  2007     OB History    Gravida  2   Para  2   Term  2   Preterm      AB      Living        SAB      TAB      Ectopic      Multiple      Live Births               Home Medications    Prior to Admission medications   Medication Sig Start Date End Date Taking? Authorizing Provider  amLODipine (NORVASC) 2.5 MG tablet Take 1 tablet (2.5 mg total) by mouth daily. 07/09/18  Yes Fayrene Helper, MD  sertraline (ZOLOFT) 50 MG tablet Please start one week after discontinuing fluoxetine. Start 25 mg daily for one week, then 50 mg daily. Patient not taking: Reported on 10/05/2018 09/09/18   Norman Clay, MD  temazepam (RESTORIL) 15 MG capsule Take 1 capsule (15 mg total) by mouth at bedtime as needed for sleep. Patient not taking: Reported on 10/05/2018 08/06/18   Fayrene Helper, MD  traZODone (DESYREL) 50 MG tablet 25-50 mg at night as needed  for sleep Patient not taking: Reported on 10/05/2018 09/08/18   Norman Clay, MD    Family History Family History  Problem Relation Age of Onset  . Hypertension Mother   . Diabetes Mother   . Drug abuse Mother   . Hypertension Father   . Hypertension Sister   . Hypertension Sister     Social History Social History   Tobacco Use  . Smoking status: Never Smoker  . Smokeless tobacco: Never Used  Substance Use Topics  . Alcohol use: No  . Drug use: No     Allergies   Ketorolac tromethamine   Review of Systems Review of Systems  Constitutional: Negative for chills and fever.  Respiratory: Positive for chest tightness and shortness of breath.   Cardiovascular: Positive for  chest pain.  Gastrointestinal: Positive for nausea. Negative for abdominal pain and vomiting.  Neurological: Positive for dizziness and light-headedness. Negative for syncope, weakness and numbness.  All other systems reviewed and are negative.    Physical Exam Updated Vital Signs BP 133/89   Pulse (!) 52   Temp 98.7 F (37.1 C) (Oral)   Resp 14   Ht 5\' 7"  (1.702 m)   Wt 105.7 kg   SpO2 100%   BMI 36.49 kg/m   Physical Exam  Constitutional: She appears well-developed and well-nourished. No distress.  HENT:  Head: Normocephalic and atraumatic.  Eyes: Conjunctivae are normal. Right eye exhibits no discharge. Left eye exhibits no discharge.  Neck: Normal range of motion. Neck supple. No JVD present. No tracheal deviation present.  Cardiovascular: Normal rate and regular rhythm.  Pulses:      Carotid pulses are 2+ on the right side, and 2+ on the left side.      Radial pulses are 2+ on the right side, and 2+ on the left side.       Dorsalis pedis pulses are 2+ on the right side, and 2+ on the left side.       Posterior tibial pulses are 2+ on the right side, and 2+ on the left side.  Homans sign absent bilaterally, no lower extremity edema, no palpable cords, compartments are soft   Pulmonary/Chest: Effort normal and breath sounds normal.  No tenderness to palpation of the chest wall.  Speaking in full sentences without difficulty.  Lungs clear to auscultation bilaterally  Abdominal: Soft. Bowel sounds are normal. She exhibits no distension. There is no tenderness.  Musculoskeletal: She exhibits no edema.       Right lower leg: Normal. She exhibits no tenderness and no edema.       Left lower leg: Normal. She exhibits no tenderness and no edema.  Neurological: She is alert.  Skin: Skin is warm and dry. No erythema.  Psychiatric: She has a normal mood and affect. Her behavior is normal.  Nursing note and vitals reviewed.    ED Treatments / Results  Labs (all labs ordered  are listed, but only abnormal results are displayed) Labs Reviewed  BASIC METABOLIC PANEL - Abnormal; Notable for the following components:      Result Value   Potassium 3.4 (*)    Calcium 8.3 (*)    All other components within normal limits  CBC - Abnormal; Notable for the following components:   Hemoglobin 9.9 (*)    HCT 34.1 (*)    MCH 25.5 (*)    MCHC 29.0 (*)    RDW 16.1 (*)    All other components within normal limits  I-STAT  TROPONIN, ED  I-STAT TROPONIN, ED    EKG EKG Interpretation  Date/Time:  Monday October 05 2018 20:21:33 EST Ventricular Rate:  57 PR Interval:    QRS Duration: 103 QT Interval:  480 QTC Calculation: 468 R Axis:   45 Text Interpretation:  Sinus rhythm Baseline wander in lead(s) V3 Confirmed by Virgel Manifold [phone]) on 10/05/2018 9:08:53 PM   Radiology Dg Chest 2 View  Result Date: 10/05/2018 CLINICAL DATA:  Dyspnea and chest pain tonight with hypertension EXAM: CHEST - 2 VIEW COMPARISON:  05/20/2015 CXR FINDINGS: Normal heart size. Tortuous atherosclerotic aorta. Stable eventration of the diaphragms. No acute pulmonary consolidation or CHF. No acute osseous appearing abnormality. Degenerative changes are present dorsal spine. IMPRESSION: No active cardiopulmonary disease. Electronically Signed   By: Ashley Royalty M.D.   On: 10/05/2018 21:27    Procedures Procedures (including critical care time)  Medications Ordered in ED Medications  ondansetron (ZOFRAN) injection 4 mg (has no administration in time range)  famotidine (PEPCID) IVPB 20 mg premix (has no administration in time range)  morphine 4 MG/ML injection 4 mg (4 mg Intravenous Given 10/05/18 2135)     Initial Impression / Assessment and Plan / ED Course  I have reviewed the triage vital signs and the nursing notes.  Pertinent labs & imaging results that were available during my care of the patient were reviewed by me and considered in my medical decision making (see chart for  details).    Patient presenting with chest tightness.  She had an episode of shortness of breath, chest pain/pressure, lightheadedness, and nausea earlier this evening which lasted approximately 1.5 minutes.  While in the ED she has been afebrile, mildly bradycardic and intermittently hypertensive.  She is nontoxic in appearance.  EKG shows normal sinus rhythm, no ischemic changes noted.  Chest x-ray shows no acute cardiopulmonary abnormalities.  Lab work significant for mild hypokalemia and anemia though H&H is stable as compared to lab work performed 2 months ago.  She had improvement in her pain with morphine though she did develop some nausea from the medication.  Doubt PE, dissection, cardiac tamponade, esophageal rupture, pneumothorax, or pneumonia.  Initial troponin is negative.  Will obtain second troponin for further evaluation.  12:45 AM Signed out to oncoming provider PA [place].  Awaiting second troponin.  On reevaluation patient is resting comfortably and states her symptoms have significantly improved.  She denies any chest pain at this time. HEART score of 3. In an otherwise low risk patient, feel it is reasonable for her to be discharged home with outpatient management.  She will follow-up with cardiology or her PCP for reevaluation and outpatient stress testing/echocardiogram.  I have discussed strict ED return precautions with the patient pending what I anticipate to be a normal troponin and the patient and family have verbalized understanding of and agreement with this plan.  Final Clinical Impressions(s) / ED Diagnoses   Final diagnoses:  Atypical chest pain    ED Discharge Orders    None       Renita Papa, PA-C 10/06/18 0048    Virgel Manifold, MD 10/06/18 [phone]

## 2018-10-05 NOTE — ED Triage Notes (Signed)
Pt arrives via [facility] from urgent care in [facility] with sudden onset CP 8/10 intermittently while driving today. CP is central, nonradiating, feels like pressure. Associated symptoms include numbness across shoulders, SOB, nausea, lightheadedness, and dizzy. Comes and goes without pattern. No previous medical history to report other than HTN (takes amlodipine).

## 2018-10-06 LAB — I-STAT TROPONIN, ED: Troponin i, poc: 0 ng/mL (ref 0.00–0.08)

## 2018-10-06 MED ORDER — FAMOTIDINE IN NACL 20-0.9 MG/50ML-% IV SOLN
20.0000 mg | Freq: Once | INTRAVENOUS | Status: AC
  Administered 2018-10-06: 20 mg via INTRAVENOUS
  Filled 2018-10-06: qty 50

## 2018-10-06 MED ORDER — ONDANSETRON HCL 4 MG/2ML IJ SOLN
4.0000 mg | Freq: Once | INTRAMUSCULAR | Status: AC
  Administered 2018-10-06: 4 mg via INTRAVENOUS
  Filled 2018-10-06: qty 2

## 2018-10-06 NOTE — ED Provider Notes (Signed)
Chest pain that started at 6:00 pm tonight (11/18) Initial evaluation negative, normal EKG, pending delta trop Anticipate discharge home  1:20 - Delta trop is negative. VSS. The patient can be discharged home per plan of previous treatment team.    Charlann Lange, PA-C 10/06/18 0121    Virgel Manifold, MD 10/06/18 [phone]

## 2018-10-06 NOTE — Discharge Instructions (Signed)
You can alternate 600 mg of ibuprofen and [phone] mg of Tylenol every 3 hours as needed for pain. Do not exceed 4000 mg of Tylenol daily.  Take ibuprofen with food to avoid upset stomach issues.  Continue taking your home medicines as prescribed.  There is no evidence of a heart attack in your work-up today.  Call your primary care physician or cardiologist for reevaluation of your symptoms.  They may want you to have a stress test or ultrasound of your heart known as an echocardiogram.  Return to the emergency department if any concerning signs or symptoms develop such as worsening pain, persistent shortness of breath, high fevers, lightheadedness, or passing out.

## 2018-10-08 ENCOUNTER — Ambulatory Visit (HOSPITAL_COMMUNITY): Payer: 59 | Admitting: Psychiatry

## 2018-10-09 ENCOUNTER — Ambulatory Visit (INDEPENDENT_AMBULATORY_CARE_PROVIDER_SITE_OTHER): Payer: 59 | Admitting: Psychiatry

## 2018-10-09 DIAGNOSIS — F332 Major depressive disorder, recurrent severe without psychotic features: Secondary | ICD-10-CM

## 2018-10-09 NOTE — Progress Notes (Signed)
   THERAPIST PROGRESS NOTE  Session Time: Friday 10/09/2018 8:11 AM - 8:48 AM  Participation Level: Minimal  Behavioral Response: NeatDepressed and Dysphoric lethargic, difficult to establish rapport  Type of Therapy: Individual Therapy  Treatment Goals addressed: Learn and implement behavioral and cognitive strategies to overcome depression and cope with anxiety  Interventions: CBT and Supportive  Summary: Olivia Woodward is a 52 y.o. female who is refered for services via [facility] due to experiencing symptoms of anxiety and depression. She denies any psychiatric hospitalixations. She participated in therapy with [name] for about 2 years. She last attended 7 years ago. Patient states experiencing depression and ad anxiety along with grief. She reports financial issues and says her grandmother died in 05-07-17. She reports having a lot of weight on her shoulders and being stressed with everyday life. She worries about her son who is in her second year of college. She fears he will go down the wrong path.  She reports isolative behaviors, social withdrawal, crying spells, insomnia, and anxiety along with feelings of hopelessness and worthlessness. She has had difficulty at work including poor concentration and difficulty dealing with [employer] customers. This has resulted in a written reprimand.  Patient last was seen in September 2019 at assessment. She reports missing appointments due to conflict with her work schedule. She reports no change in symptoms. She discontinued taking antidepressant as she said it made her jittery. She will discuss with psychiatrist [name] at next appointment in December. She continues to worry about her children and reports feeling distant from them. She reports having no energy to be more involved. She attends work and takes daughter to events but states feeling numb and just going through the motions.  She continues to experience significant sleep difficulty and reports  sleeping about 2 hours per night. She also reports skipping meals. Patient reports she began to experience panic attacks about 2 months ago and has these attacks about 3 x a week. She reports increased stress and sadness triggered by the death of her best friend this past Tuesday.   Suicidal/Homicidal: Nowithout intent/plan  Therapist Response: Tried to establish rapport, reviewed symptoms, administered PHQ-9 and GAD-7, discussed stressors, facilitated expression of thoughts and feelings, validated feelings, began to explore patient's values (children, family), discussed the role of self-care in managing stress, depression, and anxiety, developed plan with patient to avoid skipping meals and improve eating patterns, provided patient with handout on depression to review for next session  Plan: Return again in 2 weeks.  Diagnosis: Axis I: MDD, Recurrent, Severe    Axis II: Deferred    Liz Pinho, LCSW 10/09/2018

## 2018-10-13 NOTE — Progress Notes (Deleted)
[facility] MD/PA/NP OP Progress Note  10/13/2018 12:30 PM Olivia Woodward  MRN:  [id number]  Chief Complaint:  HPI:  - Patient was advised to switch from fluoxetine to sertraline due to side effect of feeling jittery   Visit Diagnosis: No diagnosis found.  Past Psychiatric History: Please see initial evaluation for full details. I have reviewed the history. No updates at this time.     Past Medical History:  Past Medical History:  Diagnosis Date  . Anemia   . Generalized headaches   . Hypertension     Past Surgical History:  Procedure Laterality Date  . ABDOMINAL HYSTERECTOMY     fibroids, partial  . BREAST REDUCTION SURGERY  2005  . GASTRIC BYPASS  2007    Family Psychiatric History: Please see initial evaluation for full details. I have reviewed the history. No updates at this time.     Family History:  Family History  Problem Relation Age of Onset  . Hypertension Mother   . Diabetes Mother   . Drug abuse Mother   . Hypertension Father   . Hypertension Sister   . Hypertension Sister     Social History:  Social History   Socioeconomic History  . Marital status: Single    Spouse name: Not on file  . Number of children: Not on file  . Years of education: Not on file  . Highest education level: Not on file  Occupational History  . Not on file  Social Needs  . Financial resource strain: Not on file  . Food insecurity:    Worry: Not on file    Inability: Not on file  . Transportation needs:    Medical: Not on file    Non-medical: Not on file  Tobacco Use  . Smoking status: Never Smoker  . Smokeless tobacco: Never Used  Substance and Sexual Activity  . Alcohol use: No  . Drug use: No  . Sexual activity: Yes    Birth control/protection: Condom  Lifestyle  . Physical activity:    Days per week: Not on file    Minutes per session: Not on file  . Stress: Not on file  Relationships  . Social connections:    Talks on phone: Not on file    Gets together:  Not on file    Attends religious service: Not on file    Active member of club or organization: Not on file    Attends meetings of clubs or organizations: Not on file    Relationship status: Not on file  Other Topics Concern  . Not on file  Social History Narrative  . Not on file    Allergies:  Allergies  Allergen Reactions  . Ketorolac Tromethamine Hives    Lips swelled    Metabolic Disorder Labs: Lab Results  Component Value Date   HGBA1C 5.3 07/26/2010   No results found for: PROLACTIN Lab Results  Component Value Date   CHOL 166 12/27/2017   TRIG 50 12/27/2017   HDL 83 12/27/2017   CHOLHDL 2.0 12/27/2017   VLDL 10 01/02/2017   LDLCALC 71 12/27/2017   LDLCALC 74 01/02/2017   Lab Results  Component Value Date   TSH 0.73 12/27/2017   TSH 0.58 01/02/2017    Therapeutic Level Labs: No results found for: LITHIUM No results found for: VALPROATE No components found for:  CBMZ  Current Medications: Current Outpatient Medications  Medication Sig Dispense Refill  . amLODipine (NORVASC) 2.5 MG tablet Take 1 tablet (2.5  mg total) by mouth daily. 30 tablet 3  . sertraline (ZOLOFT) 50 MG tablet Please start one week after discontinuing fluoxetine. Start 25 mg daily for one week, then 50 mg daily. (Patient not taking: Reported on 10/05/2018) 30 tablet 0  . temazepam (RESTORIL) 15 MG capsule Take 1 capsule (15 mg total) by mouth at bedtime as needed for sleep. 30 capsule 2  . traZODone (DESYREL) 50 MG tablet 25-50 mg at night as needed for sleep (Patient not taking: Reported on 10/05/2018) 30 tablet 0   No current facility-administered medications for this visit.      Musculoskeletal: Strength & Muscle Tone: within normal limits Gait & Station: normal Patient leans: N/A  Psychiatric Specialty Exam: ROS  There were no vitals taken for this visit.There is no height or weight on file to calculate BMI.  General Appearance: Fairly Groomed  Eye Contact:  Good  Speech:   Clear and Coherent  Volume:  Normal  Mood:  {BHH MOOD:22306}  Affect:  {Affect (PAA):22687}  Thought Process:  Coherent  Orientation:  Full (Time, Place, and Person)  Thought Content: Logical   Suicidal Thoughts:  {ST/HT (PAA):22692}  Homicidal Thoughts:  {ST/HT (PAA):22692}  Memory:  Immediate;   Good  Judgement:  {Judgement (PAA):22694}  Insight:  {Insight (PAA):22695}  Psychomotor Activity:  Normal  Concentration:  Concentration: Good and Attention Span: Good  Recall:  Good  Fund of Knowledge: Good  Language: Good  Akathisia:  No  Handed:  Right  AIMS (if indicated): not done  Assets:  Communication Skills Desire for Improvement  ADL's:  Intact  Cognition: WNL  Sleep:  {BHH GOOD/FAIR/POOR:22877}   Screenings: GAD-7     Counselor from 10/09/2018 in [place] Phone Follow Up from 08/04/2018 in [place] [facility] Phone Follow Up from 07/16/2018 in [facility] Office Visit from 07/09/2018 in [facility]  Total GAD-7 Score  19  12  12  15     PHQ2-9     Counselor from 10/09/2018 in [place] [facility] Phone Follow Up from 08/04/2018 in [facility] Phone Follow Up from 07/16/2018 in [facility] Office Visit from 07/09/2018 in [facility] Office Visit from 11/12/2016 in [facility]  PHQ-2 Total Score  6  6  5  6   0  PHQ-9 Total Score  20  15  15  20   -       Assessment and Plan:  Olivia Woodward is a 52 y.o. year old female with a history of depression, anxiety,  hypertension, s/p gastric bypass surgery in 2017,, who presents for follow up appointment for No diagnosis found.  # MDD, moderate, recurrent with anxious distress  Patient endorses depressive symptoms with significant anxiety, which has worsened over the past year.  Psychosocial stressors including parenting her children, and  loss of her maternal grandmother.  Will uptitrate fluoxetine to target depression and anxiety.  Will switch from temazepam to trazodone as needed for insomnia given patient reports limited benefit from temazepam.  Discussed cognitive diffusion.  She is encouraged to continue to see a therapist.   Plan 1. Increase fluoxetine 40 mg daily  2. Discontinue Temazepam 3. Continue Trazodone 25-50 mg at night as needed for sleep 4. Return to clinic in one month for 30 mins   The patient demonstrates the following risk factors for suicide: Chronic risk factors for suicide include: psychiatric disorder of depression.  Acute risk factors for suicide include: loss (financial, interpersonal, professional). Protective factors for this patient include: responsibility to others (children, family), coping skills, hope for the future and religious beliefs against suicide. Considering these factors, the overall suicide risk at this point appears to be low. Patient is appropriate for outpatient follow up.   Norman Clay, MD 10/13/2018, 12:30 PM

## 2018-10-19 ENCOUNTER — Ambulatory Visit (INDEPENDENT_AMBULATORY_CARE_PROVIDER_SITE_OTHER): Payer: 59 | Admitting: Psychiatry

## 2018-10-19 ENCOUNTER — Ambulatory Visit (HOSPITAL_COMMUNITY): Payer: 59 | Admitting: Psychiatry

## 2018-10-19 ENCOUNTER — Encounter (HOSPITAL_COMMUNITY): Payer: Self-pay | Admitting: Psychiatry

## 2018-10-19 VITALS — BP 136/87 | HR 97 | Ht 67.0 in | Wt 237.0 lb

## 2018-10-19 DIAGNOSIS — F331 Major depressive disorder, recurrent, moderate: Secondary | ICD-10-CM

## 2018-10-19 MED ORDER — VENLAFAXINE HCL ER 150 MG PO CP24: ORAL_CAPSULE | ORAL | 1 refills | Status: DC

## 2018-10-19 MED ORDER — VENLAFAXINE HCL ER 37.5 MG PO CP24: ORAL_CAPSULE | ORAL | 0 refills | Status: DC

## 2018-10-19 MED ORDER — QUETIAPINE FUMARATE 25 MG PO TABS: 25.0000 mg | ORAL_TABLET | Freq: Every day | ORAL | 1 refills | Status: DC

## 2018-10-19 NOTE — Progress Notes (Signed)
BH MD/PA/NP OP Progress Note  10/19/2018 11:25 AM Olivia Woodward  MRN:  [id number]  Chief Complaint:  Chief Complaint    Follow-up; Depression     HPI:  The appointment was rescheduled as she showed up late for the earlier appointment.  - Patient was advised to switch from fluoxetine to sertraline due to side effect of feeling jittery  Patient presents for follow-up appointment for depression.  She states that she could not continue fluoxetine or sertraline as it made her feel jittery.  She feels terrible, stating that she needed to miss work.  She has not been able to function well as she used to.  She has difficulty in concentration and she has not been able to communicate well.  She has been argumentative.  She has not had good connection with her children due to her communication.  She felt that this note Probation officer thought of this patient as "drug seeker" when she asked xanax on the phone. Although the patient is informed that it is not true (and informed the concern of using benzodiazepine without adequate treatment with antidepressant), she still feels this way.  She states that she is welcome to write consent so that the note from her pervious psychiatrist to be reviewed. She does not remember if she tried Effexor in the past, stating that she does not know her medical history. She repetitively states that she wants "something to work."   She has insomnia.  She has good appetite.  She has difficulty concentration.  She feels depressed.  She denies SI.  She feels anxious and tense.  She occasionally has panic attacks. She did not find Trazodone to be helpful for insomnia. She wants to be out from work to see if medication works for her.  Temazepam filled on 09/18/2018  (no benzo since 2015)  Wt Readings from Last 3 Encounters:  10/19/18 237 lb (107.5 kg)  10/05/18 233 lb (105.7 kg)  09/08/18 236 lb (107 kg)    Visit Diagnosis:    ICD-10-CM   1. MDD (major depressive disorder),  recurrent episode, moderate ([place]) F33.1     Past Psychiatric History: Please see initial evaluation for full details. I have reviewed the history. No updates at this time.     Past Medical History:  Past Medical History:  Diagnosis Date  . Anemia   . Generalized headaches   . Hypertension     Past Surgical History:  Procedure Laterality Date  . ABDOMINAL HYSTERECTOMY     fibroids, partial  . BREAST REDUCTION SURGERY  2005  . GASTRIC BYPASS  2007    Family Psychiatric History: Please see initial evaluation for full details. I have reviewed the history. No updates at this time.     Family History:  Family History  Problem Relation Age of Onset  . Hypertension Mother   . Diabetes Mother   . Drug abuse Mother   . Hypertension Father   . Hypertension Sister   . Hypertension Sister     Social History:  Social History   Socioeconomic History  . Marital status: Single    Spouse name: Not on file  . Number of children: Not on file  . Years of education: Not on file  . Highest education level: Not on file  Occupational History  . Not on file  Social Needs  . Financial resource strain: Not on file  . Food insecurity:    Worry: Not on file    Inability: Not on file  .  Transportation needs:    Medical: Not on file    Non-medical: Not on file  Tobacco Use  . Smoking status: Never Smoker  . Smokeless tobacco: Never Used  Substance and Sexual Activity  . Alcohol use: No  . Drug use: No  . Sexual activity: Yes    Birth control/protection: Condom  Lifestyle  . Physical activity:    Days per week: Not on file    Minutes per session: Not on file  . Stress: Not on file  Relationships  . Social connections:    Talks on phone: Not on file    Gets together: Not on file    Attends religious service: Not on file    Active member of club or organization: Not on file    Attends meetings of clubs or organizations: Not on file    Relationship status: Not on file  Other  Topics Concern  . Not on file  Social History Narrative  . Not on file    Allergies:  Allergies  Allergen Reactions  . Ketorolac Tromethamine Hives    Lips swelled    Metabolic Disorder Labs: Lab Results  Component Value Date   HGBA1C 5.3 07/26/2010   No results found for: PROLACTIN Lab Results  Component Value Date   CHOL 166 12/27/2017   TRIG 50 12/27/2017   HDL 83 12/27/2017   CHOLHDL 2.0 12/27/2017   VLDL 10 01/02/2017   LDLCALC 71 12/27/2017   LDLCALC 74 01/02/2017   Lab Results  Component Value Date   TSH 0.73 12/27/2017   TSH 0.58 01/02/2017    Therapeutic Level Labs: No results found for: LITHIUM No results found for: VALPROATE No components found for:  CBMZ  Current Medications: Current Outpatient Medications  Medication Sig Dispense Refill  . amLODipine (NORVASC) 2.5 MG tablet Take 1 tablet (2.5 mg total) by mouth daily. 30 tablet 3  . temazepam (RESTORIL) 15 MG capsule Take 1 capsule (15 mg total) by mouth at bedtime as needed for sleep. 30 capsule 2  . QUEtiapine (SEROQUEL) 25 MG tablet Take 1 tablet (25 mg total) by mouth at bedtime. 30 tablet 1  . venlafaxine XR (EFFEXOR-XR) 150 MG 24 hr capsule 150 mg daily. Start after completing 75 mg daily for one week 30 capsule 1  . venlafaxine XR (EFFEXOR-XR) 37.5 MG 24 hr capsule Venlafaxine 37.5 mg daily for one week, then 75 mg daily for one week 21 capsule 0   No current facility-administered medications for this visit.      Musculoskeletal: Strength & Muscle Tone: within normal limits Gait & Station: normal Patient leans: N/A  Psychiatric Specialty Exam: Review of Systems  Psychiatric/Behavioral: Positive for depression. Negative for hallucinations, memory loss, substance abuse and suicidal ideas. The patient is nervous/anxious and has insomnia.   All other systems reviewed and are negative.   Blood pressure 136/87, pulse 97, height 5\' 7"  (1.702 m), weight 237 lb (107.5 kg), SpO2 100 %.Body mass  index is 37.12 kg/m.  General Appearance: Fairly Groomed  Eye Contact:  Good  Speech:  Clear and Coherent  Volume:  Normal  Mood:  Depressed  Affect:  Appropriate, Congruent, Restricted and tense, irritable  Thought Process:  Coherent  Orientation:  Full (Time, Place, and Person)  Thought Content: Logical   Suicidal Thoughts:  No  Homicidal Thoughts:  No  Memory:  Immediate;   Good  Judgement:  Fair  Insight:  Present  Psychomotor Activity:  Normal  Concentration:  Concentration: Poor and  Attention Span: Poor  Recall:  Good  Fund of Knowledge: Good  Language: Good  Akathisia:  No  Handed:  Right  AIMS (if indicated): not done  Assets:  Communication Skills Desire for Improvement  ADL's:  Intact  Cognition: WNL  Sleep:  Poor   Screenings: GAD-7     Counselor from 10/09/2018 in [place] Phone Follow Up from 08/04/2018 in [place] [facility] Phone Follow Up from 07/16/2018 in [facility] Office Visit from 07/09/2018 in [facility]  Total GAD-7 Score  19  12  12  15     PHQ2-9     Counselor from 10/09/2018 in [place] Phone Follow Up from 08/04/2018 in [place] [facility] Phone Follow Up from 07/16/2018 in [facility] Office Visit from 07/09/2018 in [facility] Office Visit from 11/12/2016 in [facility]  PHQ-2 Total Score  6  6  5  6   0  PHQ-9 Total Score  20  15  15  20   -       Assessment and Plan:  Olivia Woodward is a 52 y.o. year old female with a history of depression, anxiety,  hypertension, s/p gastric bypass surgery in 2017 , who presents for follow up appointment for MDD (major depressive disorder), recurrent episode, moderate ([place])   # MDD, moderate, recurrent with anxious distress Exam is notable for irritability, and patient needs repeated instruction a couple of  times due to decreased concentration.  Psychosocial stressors including parenting her children, loss of her maternal grandmother.  She could not tolerate sertraline/fluoxetine due to worsening anxiety.  Will start venlafaxine to target anxiety and depression.  Discussed potential side effect of hypertension.  Will start quetiapine as adjunctive treatment for depression and also to target insomnia.  Discussed potential metabolic side effect; will plan to use this medication only for short period of time due to its potential side effect.   I would support her to be out of work until the next appointment. She has symptoms of depression and anxiety, which interferes with her ability to complete tasks and collaborate with other people.  Plan 1. Start venlafaxine 37.5 mg daily for one week, then 75 mg daily for one week, then 150 mg daily  2. Start quetiapine 25 mg at night  3. Discontinue Trazodone 4. Obtain record from [name] 4. Return to clinic in 4-6 weeks for 30 mins  The patient demonstrates the following risk factors for suicide: Chronic risk factors for suicide include: psychiatric disorder of depression. Acute risk factors for suicide include: loss (financial, interpersonal, professional). Protective factors for this patient include: responsibility to others (children, family), coping skills, hope for the future and religious beliefs against suicide. Considering these factors, the overall suicide risk at this   The duration of this appointment visit was 30 minutes of face-to-face time with the patient.  Greater than 50% of this time was spent in counseling, explanation of  diagnosis, planning of further management, and coordination of care.  Norman Clay, MD 10/19/2018, 11:25 AM

## 2018-10-19 NOTE — Patient Instructions (Signed)
1. Start venlafaxine 37.5 mg daily for one week, then 75 mg daily for one week, then 150 mg daily  2. Start quetiapine 25 mg at night  3. Discontinue Trazodone 4. Obtain record from [name] 4. Return to clinic in 4-6 weeks for 30 mins

## 2018-10-20 ENCOUNTER — Other Ambulatory Visit: Payer: Self-pay | Admitting: Family Medicine

## 2018-10-21 ENCOUNTER — Encounter: Payer: Self-pay | Admitting: Family Medicine

## 2018-10-26 ENCOUNTER — Encounter: Payer: Self-pay | Admitting: Family Medicine

## 2018-10-26 ENCOUNTER — Other Ambulatory Visit (HOSPITAL_COMMUNITY)
Admission: RE | Admit: 2018-10-26 | Discharge: 2018-10-26 | Disposition: A | Discharge status: Home / Self Care | Payer: Managed Care, Other (non HMO) | Source: Ambulatory Visit | Attending: Family Medicine | Admitting: Family Medicine

## 2018-10-26 ENCOUNTER — Ambulatory Visit (INDEPENDENT_AMBULATORY_CARE_PROVIDER_SITE_OTHER): Payer: Managed Care, Other (non HMO) | Admitting: Family Medicine

## 2018-10-26 VITALS — BP 142/98 | HR 89 | Resp 15 | Ht 67.0 in | Wt 232.0 lb

## 2018-10-26 DIAGNOSIS — R002 Palpitations: Secondary | ICD-10-CM

## 2018-10-26 DIAGNOSIS — R0789 Other chest pain: Secondary | ICD-10-CM | POA: Diagnosis present

## 2018-10-26 DIAGNOSIS — I10 Essential (primary) hypertension: Secondary | ICD-10-CM | POA: Diagnosis not present

## 2018-10-26 DIAGNOSIS — D649 Anemia, unspecified: Secondary | ICD-10-CM | POA: Diagnosis not present

## 2018-10-26 DIAGNOSIS — F322 Major depressive disorder, single episode, severe without psychotic features: Secondary | ICD-10-CM

## 2018-10-26 DIAGNOSIS — F411 Generalized anxiety disorder: Secondary | ICD-10-CM

## 2018-10-26 DIAGNOSIS — Z1211 Encounter for screening for malignant neoplasm of colon: Secondary | ICD-10-CM

## 2018-10-26 DIAGNOSIS — R079 Chest pain, unspecified: Secondary | ICD-10-CM

## 2018-10-26 LAB — BASIC METABOLIC PANEL
Anion gap: 7 (ref 5–15)
BUN: 13 mg/dL (ref 6–20)
CHLORIDE: 106 mmol/L (ref 98–111)
CO2: 25 mmol/L (ref 22–32)
Calcium: 8.5 mg/dL — ABNORMAL LOW (ref 8.9–10.3)
Creatinine, Ser: 0.77 mg/dL (ref 0.44–1.00)
GFR calc Af Amer: >60 mL/min (ref >60)
GFR calc non Af Amer: >60 mL/min (ref >60)
GLUCOSE: 87 mg/dL (ref 70–99)
POTASSIUM: 3.9 mmol/L (ref 3.5–5.1)
Sodium: 138 mmol/L (ref 135–145)

## 2018-10-26 LAB — CBC
HEMATOCRIT: 37.2 % (ref 36.0–46.0)
HEMOGLOBIN: 10.3 g/dL — AB (ref 12.0–15.0)
MCH: 24.9 pg — AB (ref 26.0–34.0)
MCHC: 27.7 g/dL — AB (ref 30.0–36.0)
MCV: 89.9 fL (ref 80.0–100.0)
Platelets: 361 10*3/uL (ref 150–400)
RBC: 4.14 MIL/uL (ref 3.87–5.11)
RDW: 16.3 % — ABNORMAL HIGH (ref 11.5–15.5)
WBC: 8.1 10*3/uL (ref 4.0–10.5)
nRBC: 0 % (ref 0.0–0.2)

## 2018-10-26 LAB — IRON AND TIBC
IRON: 102 ug/dL (ref 28–170)
Saturation Ratios: 23 % (ref 10.4–31.8)
TIBC: 453 ug/dL — ABNORMAL HIGH (ref 250–450)
UIBC: 351 ug/dL

## 2018-10-26 LAB — TSH: TSH: 0.339 u[IU]/mL — ABNORMAL LOW (ref 0.350–4.500)

## 2018-10-26 LAB — FERRITIN: FERRITIN: 6 ng/mL — AB (ref 11–307)

## 2018-10-26 MED ORDER — AMLODIPINE BESYLATE 5 MG PO TABS: 5.0000 mg | ORAL_TABLET | Freq: Every day | ORAL | 3 refills | Status: DC

## 2018-10-26 MED ORDER — ERGOCALCIFEROL 1.25 MG (50000 UT) PO CAPS: 50000.0000 [IU] | ORAL_CAPSULE | ORAL | 3 refills | Status: DC

## 2018-10-26 NOTE — Patient Instructions (Signed)
F/U IN 4 MONTHS, CALL IF YOU NEED ME BEFORE  YOU ARE  REFERRED TO CARDIOLOGY TO EVALUATE NEW CHEST PAIN, AND PALPITATION IN [place] , THE OFFICE THERE WILL CALL YOU  [facility] TODAY CBC, CHEM 7 AND EGFR, IRON AND FERRITIN,AND  TSH  INCREASE IN AMLODIPINE DOSE TO 5 MG DAILY AS BP IS HIGH, TAKE TWO 2.5 MG TABLETS TOGETHER UNTIL DONE  START ONCE WEEKLY VITAMIN D WHICH IS PRESCRIBED  IF IRON IS STILL LOW, SINCE YOU CANNOT TOLERATE ORAL IRON, YOU WILL BE REFERRED TO HEMATOLOGY AT THE CANCER CENTER (4TH FLOOR [facility]) FOR iv IRON  It is important that you exercise regularly at least 30 minutes 5 times a week. If you develop chest pain, have severe difficulty breathing, or feel very tired, stop exercising immediately and seek medical attention   Thank you  for choosing [facility]. We consider it a privelige to serve you.  Delivering excellent health care in a caring and  compassionate way is our goal.  Partnering with you,  so that together we can achieve this goal is our strategy.

## 2018-10-26 NOTE — Progress Notes (Signed)
Olivia Woodward     MRN: [id number]      DOB: 08-13-1966   HPI Olivia Woodward is here for follow up and re-evaluation of chronic medical conditions, medication management and review of any available recent lab and radiology data.  Preventive health is updated, specifically  Cancer screening and Immunization.   Was in the ED transferred to [facility] in [facility] for chest pain, no specific aggravating or relieveing factors, often no associated symptoms. Hs been been diagnosed with HTN for approx 10 years and intermittent palpitations, mainly in supine position , tions or procedures which the PT has had in the interim are  addressed. The PT denies any adverse reactions to current medications since the last visit. States she was unable to tolerate oral iron C/o ongoing and uncontrolled depression and anxiety, treated by Psychiatry, not suicidal or homicidal   ROS Denies recent fever or chills. Denies sinus pressure, nasal congestion, ear pain or sore throat. Denies chest congestion, productive cough or wheezing.  Denies abdominal pain, nausea, vomiting,diarrhea or constipation.   Denies dysuria, frequency, hesitancy or incontinence. Denies joint pain, swelling and limitation in mobility. Denies headaches, seizures, numbness, or tingling.  Denies skin break down or rash.   PE  BP (!) 142/98   Pulse 89   Resp 15   Ht 5\' 7"  (1.702 m)   Wt 232 lb (105.2 kg)   SpO2 96%   BMI 36.34 kg/m    Patient alert and oriented and in no cardiopulmonary distress.  HEENT: No facial asymmetry, EOMI,   oropharynx pink and moist.  Neck supple no JVD, no mass.  Chest: Clear to auscultation bilaterally.  CVS: S1, S2 no murmurs, no S3.Regular rate.  ABD: Soft non tender.   Ext: No edema  MS: Adequate ROM spine, shoulders, hips and knees.  Skin: Intact, no ulcerations or rash noted.  Psych: Good eye contact, normal affect. Memory intact  anxious mildly  depressed appearing.  CNS: CN 2-12 intact,  power,  normal throughout.no focal deficits noted.   Assessment & Plan  Essential hypertension Uncontroled, increase amlodipine dose DASH diet and commitment to daily physical activity for a minimum of 30 minutes discussed and encouraged, as a part of hypertension management. The importance of attaining a healthy weight is also discussed.  BP/Weight 10/26/2018 10/06/2018 10/05/2018 07/09/2018 12/25/2017 0/93/2355 05/20/2201  Systolic BP 542 706 - 237 628 315 176  Diastolic BP 98 89 - 90 90 72 94  Wt. (Lbs) 232 - 233 241 228 233 225  BMI 36.34 - 36.49 37.75 35.71 37.61 36.32  Some encounter information is confidential and restricted. Go to Review Flowsheets activity to see all data.       Chest pain of uncertain etiology One month h/o intermittent chest pain associated with palpitations in pt with hypertension , and morbid obesity, negative ED workup on 10/05/2018, but sym[ptoms persist, refer to cardiology  GAD (generalized anxiety disorder) Uncontrolled , being treated by Psychiatry  Depression, major, single episode, severe ([place]) Reports very little improvement, however not suicidal or homicidal, being treated by Psychiatry  ANEMIA Reports fatigue and intolerant , check iron level and refer for IV iron if still low  Morbid obesity ([place]) Improved, still struggles with food choice when stressed Patient re-educated about  the importance of commitment to a  minimum of 150 minutes of exercise per week.  The importance of healthy food choices with portion control discussed. Encouraged to start a food diary, count calories and to consider  joining a support group. Sample diet sheets offered. Goals set by the patient for the next several months.   Weight /BMI 10/26/2018 10/05/2018 07/09/2018  WEIGHT 232 lb 233 lb 241 lb  HEIGHT 5\' 7"  5\' 7"  5\' 7"   BMI 36.34 kg/m2 36.49 kg/m2 37.75 kg/m2  Some encounter information is confidential and restricted. Go to Review Flowsheets activity to see  all data.

## 2018-10-27 ENCOUNTER — Encounter: Payer: Self-pay | Admitting: Family Medicine

## 2018-10-27 DIAGNOSIS — R0789 Other chest pain: Principal | ICD-10-CM

## 2018-10-27 DIAGNOSIS — R079 Chest pain, unspecified: Secondary | ICD-10-CM | POA: Insufficient documentation

## 2018-10-27 NOTE — Assessment & Plan Note (Signed)
Uncontroled, increase amlodipine dose DASH diet and commitment to daily physical activity for a minimum of 30 minutes discussed and encouraged, as a part of hypertension management. The importance of attaining a healthy weight is also discussed.  BP/Weight 10/26/2018 10/06/2018 10/05/2018 07/09/2018 12/25/2017 5/92/7639 02/18/2002  Systolic BP 794 446 - 190 122 241 146  Diastolic BP 98 89 - 90 90 72 94  Wt. (Lbs) 232 - 233 241 228 233 225  BMI 36.34 - 36.49 37.75 35.71 37.61 36.32  Some encounter information is confidential and restricted. Go to Review Flowsheets activity to see all data.

## 2018-10-27 NOTE — Assessment & Plan Note (Signed)
Uncontrolled , being treated by Psychiatry

## 2018-10-27 NOTE — Assessment & Plan Note (Signed)
One month h/o intermittent chest pain associated with palpitations in pt with hypertension , and morbid obesity, negative ED workup on 10/05/2018, but sym[ptoms persist, refer to cardiology

## 2018-10-27 NOTE — Assessment & Plan Note (Signed)
Reports fatigue and intolerant , check iron level and refer for IV iron if still low

## 2018-10-27 NOTE — Assessment & Plan Note (Signed)
Improved, still struggles with food choice when stressed Patient re-educated about  the importance of commitment to a  minimum of 150 minutes of exercise per week.  The importance of healthy food choices with portion control discussed. Encouraged to start a food diary, count calories and to consider  joining a support group. Sample diet sheets offered. Goals set by the patient for the next several months.   Weight /BMI 10/26/2018 10/05/2018 07/09/2018  WEIGHT 232 lb 233 lb 241 lb  HEIGHT 5\' 7"  5\' 7"  5\' 7"   BMI 36.34 kg/m2 36.49 kg/m2 37.75 kg/m2  Some encounter information is confidential and restricted. Go to Review Flowsheets activity to see all data.

## 2018-10-27 NOTE — Assessment & Plan Note (Signed)
Reports very little improvement, however not suicidal or homicidal, being treated by Psychiatry

## 2018-10-29 ENCOUNTER — Ambulatory Visit (HOSPITAL_COMMUNITY): Payer: Managed Care, Other (non HMO) | Admitting: Psychiatry

## 2018-10-29 LAB — T4, FREE

## 2018-11-02 ENCOUNTER — Telehealth: Payer: Self-pay

## 2018-11-02 DIAGNOSIS — R7989 Other specified abnormal findings of blood chemistry: Secondary | ICD-10-CM

## 2018-11-02 NOTE — Telephone Encounter (Signed)
T3 and T4 ordered because lab unable to add on additional test.

## 2018-11-04 ENCOUNTER — Telehealth (HOSPITAL_COMMUNITY): Payer: Self-pay | Admitting: Psychiatry

## 2018-11-04 ENCOUNTER — Other Ambulatory Visit (HOSPITAL_COMMUNITY)
Admission: RE | Admit: 2018-11-04 | Discharge: 2018-11-04 | Disposition: A | Discharge status: Home / Self Care | Payer: Managed Care, Other (non HMO) | Source: Ambulatory Visit | Attending: Family Medicine | Admitting: Family Medicine

## 2018-11-04 DIAGNOSIS — R002 Palpitations: Secondary | ICD-10-CM | POA: Diagnosis present

## 2018-11-04 DIAGNOSIS — D649 Anemia, unspecified: Secondary | ICD-10-CM | POA: Diagnosis present

## 2018-11-04 DIAGNOSIS — I10 Essential (primary) hypertension: Secondary | ICD-10-CM | POA: Diagnosis present

## 2018-11-04 DIAGNOSIS — R0789 Other chest pain: Secondary | ICD-10-CM | POA: Diagnosis not present

## 2018-11-04 LAB — FERRITIN: Ferritin: 7 ng/mL — ABNORMAL LOW (ref 11–307)

## 2018-11-04 LAB — CBC
HEMATOCRIT: 36.5 % (ref 36.0–46.0)
Hemoglobin: 10.2 g/dL — ABNORMAL LOW (ref 12.0–15.0)
MCH: 25.1 pg — ABNORMAL LOW (ref 26.0–34.0)
MCHC: 27.9 g/dL — ABNORMAL LOW (ref 30.0–36.0)
MCV: 89.9 fL (ref 80.0–100.0)
NRBC: 0 % (ref 0.0–0.2)
Platelets: 297 10*3/uL (ref 150–400)
RBC: 4.06 MIL/uL (ref 3.87–5.11)
RDW: 16.5 % — ABNORMAL HIGH (ref 11.5–15.5)
WBC: 6.1 10*3/uL (ref 4.0–10.5)

## 2018-11-04 LAB — IRON AND TIBC
Iron: 28 ug/dL (ref 28–170)
SATURATION RATIOS: 6 % — AB (ref 10.4–31.8)
TIBC: 432 ug/dL (ref 250–450)
UIBC: 404 ug/dL

## 2018-11-04 LAB — BASIC METABOLIC PANEL
ANION GAP: 4 — AB (ref 5–15)
BUN: 9 mg/dL (ref 6–20)
CHLORIDE: 106 mmol/L (ref 98–111)
CO2: 26 mmol/L (ref 22–32)
Calcium: 8.7 mg/dL — ABNORMAL LOW (ref 8.9–10.3)
Creatinine, Ser: 0.77 mg/dL (ref 0.44–1.00)
GFR calc Af Amer: >60 mL/min (ref >60)
GFR calc non Af Amer: >60 mL/min (ref >60)
GLUCOSE: 91 mg/dL (ref 70–99)
POTASSIUM: 4.3 mmol/L (ref 3.5–5.1)
Sodium: 136 mmol/L (ref 135–145)

## 2018-11-04 NOTE — Telephone Encounter (Signed)
FMLA paperwork filled to cover until 12/2018

## 2018-11-05 ENCOUNTER — Other Ambulatory Visit: Payer: Self-pay | Admitting: Family Medicine

## 2018-11-05 ENCOUNTER — Encounter: Payer: Self-pay | Admitting: Family Medicine

## 2018-11-05 DIAGNOSIS — D509 Iron deficiency anemia, unspecified: Secondary | ICD-10-CM

## 2018-11-16 ENCOUNTER — Other Ambulatory Visit: Payer: Self-pay | Admitting: Family Medicine

## 2018-11-19 ENCOUNTER — Ambulatory Visit (INDEPENDENT_AMBULATORY_CARE_PROVIDER_SITE_OTHER): Payer: Managed Care, Other (non HMO) | Admitting: Psychiatry

## 2018-11-19 ENCOUNTER — Inpatient Hospital Stay (HOSPITAL_COMMUNITY): Payer: Managed Care, Other (non HMO) | Attending: Hematology | Admitting: Hematology

## 2018-11-19 ENCOUNTER — Encounter (HOSPITAL_COMMUNITY): Payer: Self-pay | Admitting: Hematology

## 2018-11-19 ENCOUNTER — Inpatient Hospital Stay (HOSPITAL_COMMUNITY): Payer: Managed Care, Other (non HMO)

## 2018-11-19 ENCOUNTER — Other Ambulatory Visit: Payer: Self-pay

## 2018-11-19 VITALS — BP 138/86 | HR 90 | Temp 97.5°F | Resp 18 | Ht 66.0 in | Wt 236.5 lb

## 2018-11-19 DIAGNOSIS — F331 Major depressive disorder, recurrent, moderate: Secondary | ICD-10-CM

## 2018-11-19 DIAGNOSIS — I10 Essential (primary) hypertension: Secondary | ICD-10-CM | POA: Diagnosis not present

## 2018-11-19 DIAGNOSIS — D508 Other iron deficiency anemias: Secondary | ICD-10-CM

## 2018-11-19 DIAGNOSIS — D509 Iron deficiency anemia, unspecified: Secondary | ICD-10-CM | POA: Insufficient documentation

## 2018-11-19 DIAGNOSIS — E538 Deficiency of other specified B group vitamins: Secondary | ICD-10-CM | POA: Insufficient documentation

## 2018-11-19 DIAGNOSIS — Z79899 Other long term (current) drug therapy: Secondary | ICD-10-CM | POA: Diagnosis not present

## 2018-11-19 DIAGNOSIS — Z9884 Bariatric surgery status: Secondary | ICD-10-CM | POA: Diagnosis not present

## 2018-11-19 DIAGNOSIS — Z90710 Acquired absence of both cervix and uterus: Secondary | ICD-10-CM | POA: Diagnosis not present

## 2018-11-19 DIAGNOSIS — K5900 Constipation, unspecified: Secondary | ICD-10-CM | POA: Insufficient documentation

## 2018-11-19 DIAGNOSIS — D649 Anemia, unspecified: Secondary | ICD-10-CM

## 2018-11-19 LAB — COMPREHENSIVE METABOLIC PANEL
ALT: 16 U/L (ref 0–44)
AST: 24 U/L (ref 15–41)
Albumin: 3.6 g/dL (ref 3.5–5.0)
Alkaline Phosphatase: 51 U/L (ref 38–126)
Anion gap: 5 (ref 5–15)
BUN: 11 mg/dL (ref 6–20)
CHLORIDE: 108 mmol/L (ref 98–111)
CO2: 25 mmol/L (ref 22–32)
Calcium: 8.5 mg/dL — ABNORMAL LOW (ref 8.9–10.3)
Creatinine, Ser: 0.64 mg/dL (ref 0.44–1.00)
GFR calc Af Amer: >60 mL/min (ref >60)
GFR calc non Af Amer: >60 mL/min (ref >60)
Glucose, Bld: 84 mg/dL (ref 70–99)
Potassium: 3.4 mmol/L — ABNORMAL LOW (ref 3.5–5.1)
Sodium: 138 mmol/L (ref 135–145)
Total Bilirubin: 0.5 mg/dL (ref 0.3–1.2)
Total Protein: 7.5 g/dL (ref 6.5–8.1)

## 2018-11-19 LAB — RETICULOCYTES
Immature Retic Fract: 14.7 % (ref 2.3–15.9)
RBC.: 3.88 MIL/uL (ref 3.87–5.11)
Retic Count, Absolute: 40.4 10*3/uL (ref 19.0–186.0)
Retic Ct Pct: 1 % (ref 0.4–3.1)

## 2018-11-19 LAB — LACTATE DEHYDROGENASE: LDH: 148 U/L (ref 98–192)

## 2018-11-19 LAB — VITAMIN B12: Vitamin B-12: 59 pg/mL — ABNORMAL LOW (ref 180–914)

## 2018-11-19 LAB — TSH: TSH: 0.375 u[IU]/mL (ref 0.350–4.500)

## 2018-11-19 LAB — FOLATE: Folate: 7.3 ng/mL (ref >5.9)

## 2018-11-19 NOTE — Progress Notes (Signed)
CONSULT NOTE  Patient Care Team: Fayrene Helper, MD as PCP - General Rourk, Cristopher Estimable, MD as Consulting Physician (Gastroenterology)  CHIEF COMPLAINTS/PURPOSE OF CONSULTATION: Anemia  HISTORY OF PRESENTING ILLNESS:  Olivia Woodward 53 y.o. female is here because of anemia. She was sent by her primary care doctor for low hemoglobin and ferritin. She admits to having maroon colored stools mainly for the past week but she had been having dark stools for months. She has tried oral iron supplements in the past and they caused constipation. She does not have periods any more she had a hysterectomy 8 years ago. She started two new medications recently. She started two new medications seroquel and restoril. She eats ice daily and occasionally eats flour. Denies any nausea, vomiting, or diarrhea. Denies any new pains. Had not noticed any such as epistaxis or hematuria. Denies recent chest pain on exertion, shortness of breath on minimal exertion, pre-syncopal episodes, or palpitations. Denies any numbness or tingling in hands or feet. Denies any recent fevers, infections, or recent hospitalizations.  She has a family history of her father and sister has anemia. Her sister has needed blood transfusions in the past. She denies ever needing a blood transfusion nor has ever seen a hematologist for anything in the past.  She lives at home with her two children. She is full functioning and performs all her own ADLs and activities.  She is scheduled for a colonoscopy this February. She had no prior history or diagnosis of cancer. Her age appropriate screening programs are up-to-date.   MEDICAL HISTORY:  Past Medical History:  Diagnosis Date  . Anemia   . Generalized headaches   . Hypertension     SURGICAL HISTORY: Past Surgical History:  Procedure Laterality Date  . ABDOMINAL HYSTERECTOMY     fibroids, partial  . BREAST REDUCTION SURGERY  2005  . GASTRIC BYPASS  2007    SOCIAL  HISTORY: Social History   Socioeconomic History  . Marital status: Single    Spouse name: Not on file  . Number of children: Not on file  . Years of education: Not on file  . Highest education level: Not on file  Occupational History  . Not on file  Social Needs  . Financial resource strain: Not on file  . Food insecurity:    Worry: Not on file    Inability: Not on file  . Transportation needs:    Medical: Not on file    Non-medical: Not on file  Tobacco Use  . Smoking status: Never Smoker  . Smokeless tobacco: Never Used  Substance and Sexual Activity  . Alcohol use: No  . Drug use: No  . Sexual activity: Yes    Birth control/protection: Condom  Lifestyle  . Physical activity:    Days per week: Not on file    Minutes per session: Not on file  . Stress: Not on file  Relationships  . Social connections:    Talks on phone: Not on file    Gets together: Not on file    Attends religious service: Not on file    Active member of club or organization: Not on file    Attends meetings of clubs or organizations: Not on file    Relationship status: Not on file  . Intimate partner violence:    Fear of current or ex partner: Not on file    Emotionally abused: Not on file    Physically abused: Not on file  Forced sexual activity: Not on file  Other Topics Concern  . Not on file  Social History Narrative  . Not on file    FAMILY HISTORY: Family History  Problem Relation Age of Onset  . Hypertension Mother   . Diabetes Mother   . Drug abuse Mother   . Hypertension Father   . Hypertension Sister   . Hypertension Sister     ALLERGIES:  is allergic to ketorolac tromethamine.  MEDICATIONS:  Current Outpatient Medications  Medication Sig Dispense Refill  . amLODipine (NORVASC) 5 MG tablet Take 1 tablet (5 mg total) by mouth daily. 90 tablet 3  . ergocalciferol (VITAMIN D2) 1.25 MG (50000 UT) capsule Take 1 capsule (50,000 Units total) by mouth once a week. One capsule  once weekly 12 capsule 3  . QUEtiapine (SEROQUEL) 25 MG tablet Take 1 tablet (25 mg total) by mouth at bedtime. 30 tablet 1  . venlafaxine XR (EFFEXOR-XR) 150 MG 24 hr capsule 150 mg daily. Start after completing 75 mg daily for one week 30 capsule 1   No current facility-administered medications for this visit.     REVIEW OF SYSTEMS:   Constitutional: Denies fevers, chills or abnormal night sweats Eyes: Denies blurriness of vision, double vision or watery eyes Ears, nose, mouth, throat, and face: Denies mucositis or sore throat Respiratory: Denies cough, dyspnea or wheezes Cardiovascular: Denies palpitation, chest discomfort or lower extremity swelling Gastrointestinal:  Denies nausea, heartburn or change in bowel habits Skin: Denies abnormal skin rashes Lymphatics: Denies new lymphadenopathy or easy bruising Neurological:Denies numbness, tingling or new weaknesses Behavioral/Psych: Mood is stable, no new changes  All other systems were reviewed with the patient and are negative.  PHYSICAL EXAMINATION: ECOG PERFORMANCE STATUS: 1 - Symptomatic but completely ambulatory  Vitals:   11/19/18 1300  BP: 138/86  Pulse: 90  Resp: 18  Temp: (!) 97.5 F (36.4 C)  SpO2: 100%   Filed Weights   11/19/18 1300  Weight: 236 lb 8 oz (107.3 kg)    GENERAL:alert, no distress and comfortable SKIN: skin color, texture, turgor are normal, no rashes or significant lesions EYES: normal, conjunctiva are pink and non-injected, sclera clear OROPHARYNX:no exudate, no erythema and lips, buccal mucosa, and tongue normal  NECK: supple, thyroid normal size, non-tender, without nodularity LYMPH:  no palpable lymphadenopathy in the cervical, axillary or inguinal LUNGS: clear to auscultation and percussion with normal breathing effort HEART: regular rate & rhythm and no murmurs and no lower extremity edema ABDOMEN:abdomen soft, non-tender and normal bowel sounds Musculoskeletal:no cyanosis of digits and  no clubbing  PSYCH: alert & oriented x 3 with fluent speech NEURO: no focal motor/sensory deficits  LABORATORY DATA:  I have reviewed the data as listed Recent Results (from the past 2160 hour(s))  Basic metabolic panel     Status: Abnormal   Collection Time: 10/05/18  8:47 PM  Result Value Ref Range   Sodium 137 135 - 145 mmol/L   Potassium 3.4 (L) 3.5 - 5.1 mmol/L   Chloride 107 98 - 111 mmol/L   CO2 22 22 - 32 mmol/L   Glucose, Bld 95 70 - 99 mg/dL   BUN 7 6 - 20 mg/dL   Creatinine, Ser 0.68 0.44 - 1.00 mg/dL   Calcium 8.3 (L) 8.9 - 10.3 mg/dL   GFR calc non Af Amer >60 >60 mL/min   GFR calc Af Amer >60 >60 mL/min    Comment: (NOTE) The eGFR has been calculated using the CKD EPI equation.  This calculation has not been validated in all clinical situations. eGFR's persistently <60 mL/min signify possible Chronic Kidney Disease.    Anion gap 8 5 - 15    Comment: Performed at [facility] [address], [place], [place] [address]  CBC     Status: Abnormal   Collection Time: 10/05/18  8:47 PM  Result Value Ref Range   WBC 6.1 4.0 - 10.5 K/uL   RBC 3.88 3.87 - 5.11 MIL/uL   Hemoglobin 9.9 (L) 12.0 - 15.0 g/dL   HCT 34.1 (L) 36.0 - 46.0 %   MCV 87.9 80.0 - 100.0 fL   MCH 25.5 (L) 26.0 - 34.0 pg   MCHC 29.0 (L) 30.0 - 36.0 g/dL   RDW 16.1 (H) 11.5 - 15.5 %   Platelets 304 150 - 400 K/uL   nRBC 0.0 0.0 - 0.2 %    Comment: Performed at [facility], [place] [address] [place], [place] [address]  I-stat troponin, ED     Status: None   Collection Time: 10/05/18  9:46 PM  Result Value Ref Range   Troponin i, poc 0.00 0.00 - 0.08 ng/mL   Comment 3            Comment: Due to the release kinetics of cTnI, a negative result within the first hours of the onset of symptoms does not rule out myocardial infarction with certainty. If myocardial infarction is still suspected, repeat the test at appropriate intervals.   I-Stat Troponin, ED (not at [facility])     Status: None    Collection Time: 10/06/18  1:02 AM  Result Value Ref Range   Troponin i, poc 0.00 0.00 - 0.08 ng/mL   Comment 3            Comment: Due to the release kinetics of cTnI, a negative result within the first hours of the onset of symptoms does not rule out myocardial infarction with certainty. If myocardial infarction is still suspected, repeat the test at appropriate intervals.   T4, free     Status: None   Collection Time: 10/26/18 10:26 AM  Result Value Ref Range   Free T4 CANCELED     Comment: TEST NOT PERFORMED . No serum received.  Result canceled by the ancillary.   CBC     Status: Abnormal   Collection Time: 10/26/18 11:08 AM  Result Value Ref Range   WBC 8.1 4.0 - 10.5 K/uL   RBC 4.14 3.87 - 5.11 MIL/uL   Hemoglobin 10.3 (L) 12.0 - 15.0 g/dL   HCT 37.2 36.0 - 46.0 %   MCV 89.9 80.0 - 100.0 fL   MCH 24.9 (L) 26.0 - 34.0 pg   MCHC 27.7 (L) 30.0 - 36.0 g/dL   RDW 16.3 (H) 11.5 - 15.5 %   Platelets 361 150 - 400 K/uL   nRBC 0.0 0.0 - 0.2 %    Comment: Performed at [facility], [address], [place], [place] [address]  TSH     Status: Abnormal   Collection Time: 10/26/18 11:08 AM  Result Value Ref Range   TSH 0.339 (L) 0.350 - 4.500 uIU/mL    Comment: Performed by a 3rd Generation assay with a functional sensitivity of <=0.01 uIU/mL. Performed at [facility], [address], [place], [place] [address]   Basic metabolic panel     Status: Abnormal   Collection Time: 10/26/18 11:08 AM  Result Value Ref Range   Sodium 138 135 -  145 mmol/L   Potassium 3.9 3.5 - 5.1 mmol/L   Chloride 106 98 - 111 mmol/L   CO2 25 22 - 32 mmol/L   Glucose, Bld 87 70 - 99 mg/dL   BUN 13 6 - 20 mg/dL   Creatinine, Ser 0.77 0.44 - 1.00 mg/dL   Calcium 8.5 (L) 8.9 - 10.3 mg/dL   GFR calc non Af Amer >60 >60 mL/min   GFR calc Af Amer >60 >60 mL/min   Anion gap 7 5 - 15    Comment: Performed at [facility], [address] [place], [place] [address]  Iron and TIBC     Status: Abnormal    Collection Time: 10/26/18 11:08 AM  Result Value Ref Range   Iron 102 28 - 170 ug/dL   TIBC 453 (H) 250 - 450 ug/dL   Saturation Ratios 23 10.4 - 31.8 %   UIBC 351 ug/dL    Comment: Performed at [facility], [address] [place], [place] [address]  Ferritin     Status: Abnormal   Collection Time: 10/26/18 11:08 AM  Result Value Ref Range   Ferritin 6 (L) 11 - 307 ng/mL    Comment: Performed at [facility], [address], [place], [place] [address]  CBC     Status: Abnormal   Collection Time: 11/04/18  9:28 AM  Result Value Ref Range   WBC 6.1 4.0 - 10.5 K/uL   RBC 4.06 3.87 - 5.11 MIL/uL   Hemoglobin 10.2 (L) 12.0 - 15.0 g/dL   HCT 36.5 36.0 - 46.0 %   MCV 89.9 80.0 - 100.0 fL   MCH 25.1 (L) 26.0 - 34.0 pg   MCHC 27.9 (L) 30.0 - 36.0 g/dL   RDW 16.5 (H) 11.5 - 15.5 %   Platelets 297 150 - 400 K/uL   nRBC 0.0 0.0 - 0.2 %    Comment: Performed at [facility], [address], [place], [place] [address]  Basic metabolic panel     Status: Abnormal   Collection Time: 11/04/18  9:28 AM  Result Value Ref Range   Sodium 136 135 - 145 mmol/L   Potassium 4.3 3.5 - 5.1 mmol/L   Chloride 106 98 - 111 mmol/L   CO2 26 22 - 32 mmol/L   Glucose, Bld 91 70 - 99 mg/dL   BUN 9 6 - 20 mg/dL   Creatinine, Ser 0.77 0.44 - 1.00 mg/dL   Calcium 8.7 (L) 8.9 - 10.3 mg/dL   GFR calc non Af Amer >60 >60 mL/min   GFR calc Af Amer >60 >60 mL/min   Anion gap 4 (L) 5 - 15    Comment: Performed at [facility], [address] [place], [place] [address]  Iron and TIBC     Status: Abnormal   Collection Time: 11/04/18  9:29 AM  Result Value Ref Range   Iron 28 28 - 170 ug/dL   TIBC 432 250 - 450 ug/dL   Saturation Ratios 6 (L) 10.4 - 31.8 %   UIBC 404 ug/dL    Comment: Performed at [facility], [address], [place], [place] [address]  Ferritin     Status: Abnormal   Collection Time: 11/04/18  9:29 AM  Result Value Ref Range   Ferritin 7 (L) 11 - 307 ng/mL    Comment: Performed at [facility], [address] [place],  [address]  Lactate dehydrogenase     Status: None   Collection Time: 11/19/18  3:22  PM  Result Value Ref Range   LDH 148 98 - 192 U/L    Comment: Performed at [facility], [address], [place], [place] [address]  Comprehensive metabolic panel     Status: Abnormal   Collection Time: 11/19/18  3:22 PM  Result Value Ref Range   Sodium 138 135 - 145 mmol/L   Potassium 3.4 (L) 3.5 - 5.1 mmol/L   Chloride 108 98 - 111 mmol/L   CO2 25 22 - 32 mmol/L   Glucose, Bld 84 70 - 99 mg/dL   BUN 11 6 - 20 mg/dL   Creatinine, Ser 0.64 0.44 - 1.00 mg/dL   Calcium 8.5 (L) 8.9 - 10.3 mg/dL   Total Protein 7.5 6.5 - 8.1 g/dL   Albumin 3.6 3.5 - 5.0 g/dL   AST 24 15 - 41 U/L   ALT 16 0 - 44 U/L   Alkaline Phosphatase 51 38 - 126 U/L   Total Bilirubin 0.5 0.3 - 1.2 mg/dL   GFR calc non Af Amer >60 >60 mL/min   GFR calc Af Amer >60 >60 mL/min   Anion gap 5 5 - 15    Comment: Performed at [facility], [address], [place], [place] [address]  Reticulocytes     Status: None   Collection Time: 11/19/18  3:22 PM  Result Value Ref Range   Retic Ct Pct 1.0 0.4 - 3.1 %   RBC. 3.88 3.87 - 5.11 MIL/uL   Retic Count, Absolute 40.4 19.0 - 186.0 K/uL   Immature Retic Fract 14.7 2.3 - 15.9 %    Comment: Performed at [facility], [address] [place], [place] [address]  Vitamin B12     Status: Abnormal   Collection Time: 11/19/18  3:23 PM  Result Value Ref Range   Vitamin B-12 59 (L) 180 - 914 pg/mL    Comment: (NOTE) This assay is not validated for testing neonatal or myeloproliferative syndrome specimens for Vitamin B12 levels. Performed at [facility], [address], [place], [place] [address]   Folate     Status: None   Collection Time: 11/19/18  3:23 PM  Result Value Ref Range   Folate 7.3 >5.9 ng/mL    Comment: Performed at [facility], [address], [place], [place] [address]  TSH     Status: None   Collection Time: 11/19/18  3:23 PM  Result Value Ref  Range   TSH 0.375 0.350 - 4.500 uIU/mL    Comment: Performed by a 3rd Generation assay with a functional sensitivity of <=0.01 uIU/mL. Performed at [facility], [address] [place], [place] [address]     RADIOGRAPHIC STUDIES: I have personally reviewed the radiological images as listed and agreed with the findings in the report. No results found.  ASSESSMENT & PLAN:  Normocytic anemia 1.  Normocytic anemia: - Most recent blood work on 11/04/2018 shows hemoglobin of 10.2.  MCV was normal.  Ferritin was low at 6. -She denies any bright red blood per rectum.  However she has noticed burgundy colored stools in the last 1 to 2 weeks. -She is scheduled for her first screening colonoscopy in February. - She has ice pica. -She has tried taking iron pills in the past but could not tolerate due to severe constipation. - Denies any history of blood transfusions or family history of sickle cell or thalassemia.  She had a hysterectomy done about 8 years ago. - I have recommended checking her Y48, folic acid and  copper levels to rule out coexisting deficiencies. -We will also check her stool for occult blood.  We will check LDH and reticulocyte count to rule out hemolysis.  We will check SPEP to rule out underlying monoclonal gammopathy. -I had talked to her about starting her on Feraheme infusions weekly x2.  We talked about the side effects including but not limited to serious allergic reactions.  She is agreeable to move forward with parenteral iron therapy.  We will schedule her next week. -We will see her back in 2 weeks for follow-up and discuss results.     All questions were answered. The patient knows to call the clinic with any problems, questions or concerns. Derek Jack, MD 11/19/18 5:05 PM

## 2018-11-19 NOTE — Progress Notes (Signed)
   THERAPIST PROGRESS NOTE  Session Time: Thursday 11/19/2018 8:18 AM - 9:15 AM   Participation Level: Minimal  Behavioral Response: NeatDepressed and Dysphoric lethargic, difficult to establish rapport  Type of Therapy: Individual Therapy  Treatment Goals addressed: Learn and implement behavioral and cognitive strategies to overcome depression and cope with anxiety  Interventions: CBT and Supportive  Summary: Olivia Woodward is a 53 y.o. female who is refered for services via [facility] due to experiencing symptoms of anxiety and depression. She denies any psychiatric hospitalixations. She participated in therapy with [name] for about 2 years. She last attended 7 years ago. Patient states experiencing depression and ad anxiety along with grief. She reports financial issues and says her grandmother died in 2017/05/05. She reports having a lot of weight on her shoulders and being stressed with everyday life. She worries about her son who is in her second year of college. She fears he will go down the wrong path.  She reports isolative behaviors, social withdrawal, crying spells, insomnia, and anxiety along with feelings of hopelessness and worthlessness. She has had difficulty at work including poor concentration and difficulty dealing with [employer] customers. This has resulted in a written reprimand.  Patient last was seen about 5-6 weeks ago. She reports little to no change in symptoms of depression. She reports continued depressed mood, poor motivation, and decreased interest in activities. She continues to attend church regularly and visit with her biological family on Sundays but reports pushing self and acting okay on the outside but feeling very depressed on the inside. She continues to experience grief and loss issues related to the death of her best friend in 11/05/18. She also reports sadness and hopelessness about feeling disconnected from a very close friend due to conflict in December 9373.  Patient has been friends with this person for 25 years and had been visiting with friend on Mondays prior to their conflict. Now, they rarely talk to each other and only see each other at church. She reports additional stress related to her oldest sister recently moving in with her due to being evicted. Patient expresses frustration and disappointment. She states feeling overwhelmed and expresses thoughts of hopelessness about things changing. Patient also continues to experience financial stress. She has been on medical leave since December 2019 and has worked with psychiatrist [name] regarding FMLA. She expresses relief that she doesn't have to think about work right now. She reports being compliant with medication but says she doesn't think it is helping. She reports mainly staying in bed during the week.   Suicidal/Homicidal: Nowithout intent/plan  Therapist Response: reviewed symptoms, administered PHQ-9 and GAD-7, discussed stressors, facilitated expression of thoughts and feelings, validated feelings, discussed grief and loss issues along with effects on depression, assisted patient to begin to examine her pattern of interaction with others especially her sister, began to identify issues related to unassertive behavior and possible effects on depression, reviewed the role of self-care (eating patterns, exercise)in managing stress, depression, and anxiety, developed plan with patient to avoid skipping meals and improve eating patterns, developed plan with patient to walk around apartment complex 10 minutes per day, discussed importance of medication and treatment compliance  Plan: Return again in 2 weeks.  Diagnosis: Axis I: MDD, Recurrent, Severe    Axis II: Deferred    Rashaad Hallstrom, LCSW 11/19/2018

## 2018-11-19 NOTE — Patient Instructions (Signed)
[facility] Discharge Instructions  Today you saw Dr. Katragadda   Thank you for choosing [facility] to provide your oncology and hematology care.  To afford each patient quality time with our provider, please arrive at least 15 minutes before your scheduled appointment time.   If you have a lab appointment with the Cancer Center please come in thru the  Main Entrance and check in at the main information desk  You need to re-schedule your appointment should you arrive 10 or more minutes late.  We strive to give you quality time with our providers, and arriving late affects you and other patients whose appointments are after yours.  Also, if you no show three or more times for appointments you may be dismissed from the clinic at the providers discretion.     Again, thank you for choosing [facility].  Our hope is that these requests will decrease the amount of time that you wait before being seen by our physicians.       _____________________________________________________________  Should you have questions after your visit to [facility], please contact our office at [phone] between the hours of 8:00 a.m. and 4:30 p.m.  Voicemails left after 4:00 p.m. will not be returned until the following business day.  For prescription refill requests, have your pharmacy contact our office and allow 72 hours.    Cancer Center Support Programs:   > Cancer Support Group  2nd Tuesday of the month 1pm-2pm, Journey Room   

## 2018-11-19 NOTE — Assessment & Plan Note (Addendum)
1.  Normocytic anemia: - Most recent blood work on 11/04/2018 shows hemoglobin of 10.2.  MCV was normal.  Ferritin was low at 6. -She denies any bright red blood per rectum.  However she has noticed burgundy colored stools in the last 1 to 2 weeks. -She is scheduled for her first screening colonoscopy in February. - She has ice pica. -She has tried taking iron pills in the past but could not tolerate due to severe constipation. - Denies any history of blood transfusions or family history of sickle cell or thalassemia.  She had a hysterectomy done about 8 years ago. - I have recommended checking her X58, folic acid and copper levels to rule out coexisting deficiencies. -We will also check her stool for occult blood.  We will check LDH and reticulocyte count to rule out hemolysis.  We will check SPEP to rule out underlying monoclonal gammopathy. -I had talked to her about starting her on Feraheme infusions weekly x2.  We talked about the side effects including but not limited to serious allergic reactions.  She is agreeable to move forward with parenteral iron therapy.  We will schedule her next week. -We will see her back in 2 weeks for follow-up and discuss results.

## 2018-11-20 LAB — PROTEIN ELECTROPHORESIS, SERUM
A/G Ratio: 1.1 (ref 0.7–1.7)
Albumin ELP: 3.6 g/dL (ref 2.9–4.4)
Alpha-1-Globulin: 0.2 g/dL (ref 0.0–0.4)
Alpha-2-Globulin: 0.6 g/dL (ref 0.4–1.0)
BETA GLOBULIN: 1.1 g/dL (ref 0.7–1.3)
Gamma Globulin: 1.4 g/dL (ref 0.4–1.8)
Globulin, Total: 3.3 g/dL (ref 2.2–3.9)
Total Protein ELP: 6.9 g/dL (ref 6.0–8.5)

## 2018-11-20 LAB — SAVE SMEAR(SSMR), FOR PROVIDER SLIDE REVIEW

## 2018-11-21 LAB — COPPER, SERUM: Copper: 116 ug/dL (ref 72–166)

## 2018-11-24 ENCOUNTER — Other Ambulatory Visit (HOSPITAL_COMMUNITY): Payer: Self-pay | Admitting: Hematology

## 2018-11-25 ENCOUNTER — Inpatient Hospital Stay (HOSPITAL_COMMUNITY): Payer: Managed Care, Other (non HMO)

## 2018-11-25 ENCOUNTER — Encounter (HOSPITAL_COMMUNITY): Payer: Self-pay

## 2018-11-25 VITALS — BP 120/80 | HR 80 | Temp 97.8°F | Resp 18

## 2018-11-25 DIAGNOSIS — D509 Iron deficiency anemia, unspecified: Secondary | ICD-10-CM | POA: Diagnosis not present

## 2018-11-25 DIAGNOSIS — D649 Anemia, unspecified: Secondary | ICD-10-CM

## 2018-11-25 MED ORDER — SODIUM CHLORIDE 0.9% FLUSH
10.0000 mL | Freq: Once | INTRAVENOUS | Status: AC | PRN
  Administered 2018-11-25: 10 mL

## 2018-11-25 MED ORDER — SODIUM CHLORIDE 0.9 % IV SOLN
510.0000 mg | Freq: Once | INTRAVENOUS | Status: AC
  Administered 2018-11-25: 510 mg via INTRAVENOUS
  Filled 2018-11-25: qty 17

## 2018-11-25 MED ORDER — SODIUM CHLORIDE 0.9 % IV SOLN
Freq: Once | INTRAVENOUS | Status: AC
  Administered 2018-11-25: 15:00:00 via INTRAVENOUS

## 2018-11-25 NOTE — Patient Instructions (Signed)

## 2018-11-25 NOTE — Progress Notes (Signed)
Patient given written information for [facility] for review.  All questions asked and answered.  Peripheral IV with good blood return.  No s/s of distress noted.   Patient tolerated infusion with no complaints voiced.  Good blood return noted before and after infusion.  Band aid applied.  VSs with discharge and left ambulatory with no s/s of distress noted.

## 2018-11-26 DIAGNOSIS — D509 Iron deficiency anemia, unspecified: Secondary | ICD-10-CM | POA: Diagnosis not present

## 2018-11-26 LAB — OCCULT BLOOD X 1 CARD TO LAB, STOOL
FECAL OCCULT BLD: NEGATIVE
Fecal Occult Bld: NEGATIVE
Fecal Occult Bld: NEGATIVE

## 2018-11-30 ENCOUNTER — Ambulatory Visit (INDEPENDENT_AMBULATORY_CARE_PROVIDER_SITE_OTHER): Payer: Managed Care, Other (non HMO) | Admitting: Cardiology

## 2018-11-30 ENCOUNTER — Telehealth: Payer: Self-pay | Admitting: Cardiology

## 2018-11-30 ENCOUNTER — Encounter: Payer: Self-pay | Admitting: Cardiology

## 2018-11-30 ENCOUNTER — Encounter: Payer: Self-pay | Admitting: *Deleted

## 2018-11-30 VITALS — BP 145/95 | HR 71 | Ht 66.0 in | Wt 238.4 lb

## 2018-11-30 DIAGNOSIS — R079 Chest pain, unspecified: Secondary | ICD-10-CM

## 2018-11-30 DIAGNOSIS — R002 Palpitations: Secondary | ICD-10-CM

## 2018-11-30 NOTE — Telephone Encounter (Signed)
°  Precert needed for: GXT/Chest Pain   Location: [facility]    Date: Dec 07, 2018

## 2018-11-30 NOTE — Progress Notes (Signed)
Clinical Summary Olivia Woodward is a 53 y.o.female seen as new consult, referred by Dr Moshe Cipro for chest pain.   1. Chest pain - started about 2 months ago - sharp pain midchest, can go into either shoulders. 8/10 in severity. Can occur at rest or with activity. Feel nauseous, SOB. Unsure if positional. Lasts 2-3 minutes. 3 times a week - stable in severity and frequency since onset - tolerates her housework with troubles, but has some genrealized fatigue.  - no relation to food.   CAD risk factors: HTN  2. Palpitations - about 2 months, occurs seperately from the pain - feeling of fluttering, lasts a few seconds. Occurs once a week - coffee x 1, mountain dew occasionally, sweet daily occasionally, no energy drinks, no EtOH     Past Medical History:  Diagnosis Date  . Anemia   . Generalized headaches   . Hypertension      Allergies  Allergen Reactions  . Ketorolac Tromethamine Hives    Lips swelled     Current Outpatient Medications  Medication Sig Dispense Refill  . amLODipine (NORVASC) 5 MG tablet Take 1 tablet (5 mg total) by mouth daily. 90 tablet 3  . ergocalciferol (VITAMIN D2) 1.25 MG (50000 UT) capsule Take 1 capsule (50,000 Units total) by mouth once a week. One capsule once weekly 12 capsule 3  . QUEtiapine (SEROQUEL) 25 MG tablet Take 1 tablet (25 mg total) by mouth at bedtime. 30 tablet 1  . venlafaxine XR (EFFEXOR-XR) 150 MG 24 hr capsule 150 mg daily. Start after completing 75 mg daily for one week 30 capsule 1   No current facility-administered medications for this visit.      Past Surgical History:  Procedure Laterality Date  . ABDOMINAL HYSTERECTOMY     fibroids, partial  . BREAST REDUCTION SURGERY  2005  . GASTRIC BYPASS  2007     Allergies  Allergen Reactions  . Ketorolac Tromethamine Hives    Lips swelled      Family History  Problem Relation Age of Onset  . Hypertension Mother   . Diabetes Mother   . Drug abuse Mother   .  Hypertension Father   . Hypertension Sister   . Hypertension Sister      Social History Olivia Woodward reports that she has never smoked. She has never used smokeless tobacco. Olivia Woodward reports no history of alcohol use.   Review of Systems CONSTITUTIONAL: No weight loss, fever, chills, weakness or fatigue.  HEENT: Eyes: No visual loss, blurred vision, double vision or yellow sclerae.No hearing loss, sneezing, congestion, runny nose or sore throat.  SKIN: No rash or itching.  CARDIOVASCULAR: per hpi RESPIRATORY: per hpi GASTROINTESTINAL: No anorexia, nausea, vomiting or diarrhea. No abdominal pain or blood.  GENITOURINARY: No burning on urination, no polyuria NEUROLOGICAL: No headache, dizziness, syncope, paralysis, ataxia, numbness or tingling in the extremities. No change in bowel or bladder control.  MUSCULOSKELETAL: No muscle, back pain, joint pain or stiffness.  LYMPHATICS: No enlarged nodes. No history of splenectomy.  PSYCHIATRIC: No history of depression or anxiety.  ENDOCRINOLOGIC: No reports of sweating, cold or heat intolerance. No polyuria or polydipsia.  Marland Kitchen   Physical Examination Vitals:   11/30/18 0910 11/30/18 0915  BP: 133/82 (!) 145/95  Pulse: 72 71  SpO2: 100% 99%   Vitals:   11/30/18 0910  Weight: 238 lb 6.4 oz (108.1 kg)  Height: 5\' 6"  (1.676 m)    Gen: resting comfortably, no acute  distress HEENT: no scleral icterus, pupils equal round and reactive, no palptable cervical adenopathy,  CV: RRR, no m/r/g, no jvd Resp: Clear to auscultation bilaterally GI: abdomen is soft, non-tender, non-distended, normal bowel sounds, no hepatosplenomegaly MSK: extremities are warm, no edema.  Skin: warm, no rash Neuro:  no focal deficits Psych: appropriate affect     Assessment and Plan  1. Chest pain - unclear etiology - we will plan for a GXT to further evaluate  2. Palpitations - monitor rhythm during GXT, wean caffeine - if persistent/progressing  consider outpatient event monitor   F/u pending stress results      Arnoldo Lenis, M.D.

## 2018-11-30 NOTE — Patient Instructions (Signed)
Medication Instructions:  Continue all current medications.  Labwork: none  Testing/Procedures: Your physician has requested that you have an exercise tolerance test. For further information please visit [email or web address]. Please also follow instruction sheet, as given.  Office will contact with results via phone or letter.     Follow-Up:  Pending test results   Any Other Special Instructions Will Be Listed Below (If Applicable).   If you need a refill on your cardiac medications before your next appointment, please call your pharmacy.  

## 2018-12-02 ENCOUNTER — Inpatient Hospital Stay (HOSPITAL_COMMUNITY): Payer: Managed Care, Other (non HMO)

## 2018-12-02 ENCOUNTER — Encounter (HOSPITAL_COMMUNITY): Payer: Self-pay | Admitting: Hematology

## 2018-12-02 ENCOUNTER — Inpatient Hospital Stay (HOSPITAL_BASED_OUTPATIENT_CLINIC_OR_DEPARTMENT_OTHER): Payer: Managed Care, Other (non HMO) | Admitting: Hematology

## 2018-12-02 ENCOUNTER — Other Ambulatory Visit: Payer: Self-pay

## 2018-12-02 VITALS — BP 141/81 | HR 69 | Temp 98.5°F | Resp 18 | Wt 239.0 lb

## 2018-12-02 VITALS — BP 133/85 | HR 71 | Temp 97.9°F | Resp 18

## 2018-12-02 DIAGNOSIS — D649 Anemia, unspecified: Secondary | ICD-10-CM

## 2018-12-02 DIAGNOSIS — I10 Essential (primary) hypertension: Secondary | ICD-10-CM | POA: Diagnosis not present

## 2018-12-02 DIAGNOSIS — D508 Other iron deficiency anemias: Secondary | ICD-10-CM

## 2018-12-02 DIAGNOSIS — Z79899 Other long term (current) drug therapy: Secondary | ICD-10-CM

## 2018-12-02 DIAGNOSIS — D509 Iron deficiency anemia, unspecified: Secondary | ICD-10-CM | POA: Diagnosis not present

## 2018-12-02 DIAGNOSIS — E538 Deficiency of other specified B group vitamins: Secondary | ICD-10-CM

## 2018-12-02 MED ORDER — SODIUM CHLORIDE 0.9 % IV SOLN
Freq: Once | INTRAVENOUS | Status: AC
  Administered 2018-12-02: 13:00:00 via INTRAVENOUS

## 2018-12-02 MED ORDER — CYANOCOBALAMIN 1000 MCG/ML IJ SOLN
1000.0000 ug | Freq: Once | INTRAMUSCULAR | Status: AC
  Administered 2018-12-02: 1000 ug via INTRAMUSCULAR
  Filled 2018-12-02: qty 1

## 2018-12-02 MED ORDER — SODIUM CHLORIDE 0.9 % IV SOLN
510.0000 mg | Freq: Once | INTRAVENOUS | Status: AC
  Administered 2018-12-02: 510 mg via INTRAVENOUS
  Filled 2018-12-02: qty 17

## 2018-12-02 MED ORDER — SODIUM CHLORIDE 0.9% FLUSH
10.0000 mL | Freq: Once | INTRAVENOUS | Status: AC | PRN
  Administered 2018-12-02: 10 mL

## 2018-12-02 NOTE — Patient Instructions (Signed)
[facility] at [facility] Discharge Instructions     Thank you for choosing [facility] at [facility] to provide your oncology and hematology care.  To afford each patient quality time with our provider, please arrive at least 15 minutes before your scheduled appointment time.   If you have a lab appointment with the Cancer Center please come in thru the  Main Entrance and check in at the main information desk  You need to re-schedule your appointment should you arrive 10 or more minutes late.  We strive to give you quality time with our providers, and arriving late affects you and other patients whose appointments are after yours.  Also, if you no show three or more times for appointments you may be dismissed from the clinic at the providers discretion.     Again, thank you for choosing [facility].  Our hope is that these requests will decrease the amount of time that you wait before being seen by our physicians.       _____________________________________________________________  Should you have questions after your visit to  Cancer Center, please contact our office at [phone] between the hours of 8:00 a.m. and 4:30 p.m.  Voicemails left after 4:00 p.m. will not be returned until the following business day.  For prescription refill requests, have your pharmacy contact our office and allow 72 hours.    Cancer Center Support Programs:   > Cancer Support Group  2nd Tuesday of the month 1pm-2pm, Journey Room    

## 2018-12-02 NOTE — Patient Instructions (Signed)
[facility] at [facility]  Discharge Instructions:   _______________________________________________________________  Thank you for choosing [facility] at [facility] to provide your oncology and hematology care.  To afford each patient quality time with our providers, please arrive at least 15 minutes before your scheduled appointment.  You need to re-schedule your appointment if you arrive 10 or more minutes late.  We strive to give you quality time with our providers, and arriving late affects you and other patients whose appointments are after yours.  Also, if you no show three or more times for appointments you may be dismissed from the clinic.  Again, thank you for choosing [facility] at [facility]. Our hope is that these requests will allow you access to exceptional care and in a timely manner. _______________________________________________________________  If you have questions after your visit, please contact our office at [phone] between the hours of 8:30 a.m. and 5:00 p.m. Voicemails left after 4:30 p.m. will not be returned until the following business day. _______________________________________________________________  For prescription refill requests, have your pharmacy contact our office. _______________________________________________________________  Recommendations made by the consultant and any test results will be sent to your referring physician. _______________________________________________________________ 

## 2018-12-02 NOTE — Assessment & Plan Note (Signed)
1.  Normocytic anemia: - Most recent blood work on 11/04/2018 shows hemoglobin of 10.2.  MCV was normal.  Ferritin was low at 6. -She denies any bright red blood per rectum.  However she has noticed burgundy colored stools in the last 1 to 2 weeks. -She is scheduled for her first screening colonoscopy in February. - She has ice pica. -She has tried taking iron pills in the past but could not tolerate due to severe constipation. - She was started on Feraheme infusion on 11/25/2018.  She will receive second infusion today. -We reviewed her blood work with her today.  B12 was severely low at 59.  I have recommended B12 monthly shots.  She was also told to take B12 p.o. -We checked her stool which was negative on 3 different occasions. - Other blood work including SPEP, copper level, folic acid, LDH were normal. -We will see her back in 3 months for follow-up with repeat iron panel, ferritin, and B12 along with a CBC.  We will decide whether to continue B12 shots at that time.

## 2018-12-02 NOTE — Progress Notes (Signed)
Olivia Woodward, Olivia Woodward [id number]   CLINIC:  Medical Oncology/Hematology  PCP:  Olivia Helper, MD [address], [address] [place] [address] [phone]   REASON FOR VISIT: Follow-up for anemia AND B12 deficiency   CURRENT THERAPY: intermittent iron infusions AND B12 injections   INTERVAL HISTORY:  Olivia Woodward 53 y.o. female returns for routine follow-up for anemia. She is here and doing well. She did not feel any improvement in her fatigue and energy from the first infusion of iron. She does report occasional constipation. Denies any nausea, vomiting, or diarrhea. Denies any new pains. Had not noticed any recent bleeding such as epistaxis, hematuria or hematochezia. Denies recent chest pain on exertion, shortness of breath on minimal exertion, pre-syncopal episodes, or palpitations. Denies any numbness or tingling in hands or feet. Denies any recent fevers, infections, or recent hospitalizations. Denies any easy bruising. She reports her appetite at 100% and energy level at 25%.     REVIEW OF SYSTEMS:  Review of Systems  Constitutional: Positive for fatigue.  Gastrointestinal: Positive for constipation.  All other systems reviewed and are negative.    PAST MEDICAL/SURGICAL HISTORY:  Past Medical History:  Diagnosis Date  . Anemia   . Generalized headaches   . Hypertension    Past Surgical History:  Procedure Laterality Date  . ABDOMINAL HYSTERECTOMY     fibroids, partial  . BREAST REDUCTION SURGERY  2005  . GASTRIC BYPASS  2007     SOCIAL HISTORY:  Social History   Socioeconomic History  . Marital status: Single    Spouse name: Not on file  . Number of children: Not on file  . Years of education: Not on file  . Highest education level: Not on file  Occupational History  . Not on file  Social Needs  . Financial resource strain: Not on file  . Food insecurity:    Worry: Not on file    Inability: Not on file  .  Transportation needs:    Medical: Not on file    Non-medical: Not on file  Tobacco Use  . Smoking status: Never Smoker  . Smokeless tobacco: Never Used  Substance and Sexual Activity  . Alcohol use: No  . Drug use: No  . Sexual activity: Yes    Birth control/protection: Condom  Lifestyle  . Physical activity:    Days per week: Not on file    Minutes per session: Not on file  . Stress: Not on file  Relationships  . Social connections:    Talks on phone: Not on file    Gets together: Not on file    Attends religious service: Not on file    Active member of club or organization: Not on file    Attends meetings of clubs or organizations: Not on file    Relationship status: Not on file  . Intimate partner violence:    Fear of current or ex partner: Not on file    Emotionally abused: Not on file    Physically abused: Not on file    Forced sexual activity: Not on file  Other Topics Concern  . Not on file  Social History Narrative  . Not on file    FAMILY HISTORY:  Family History  Problem Relation Age of Onset  . Hypertension Mother   . Diabetes Mother   . Drug abuse Mother   . Hypertension Father   . Hypertension Sister   .  Hypertension Sister     CURRENT MEDICATIONS:  Outpatient Encounter Medications as of 12/02/2018  Medication Sig  . amLODipine (NORVASC) 5 MG tablet Take 1 tablet (5 mg total) by mouth daily.  . ergocalciferol (VITAMIN D2) 1.25 MG (50000 UT) capsule Take 1 capsule (50,000 Units total) by mouth once a week. One capsule once weekly  . QUEtiapine (SEROQUEL) 25 MG tablet Take 1 tablet (25 mg total) by mouth at bedtime.  Marland Kitchen venlafaxine XR (EFFEXOR-XR) 150 MG 24 hr capsule 150 mg daily. Start after completing 75 mg daily for one week   No facility-administered encounter medications on file as of 12/02/2018.     ALLERGIES:  Allergies  Allergen Reactions  . Ketorolac Tromethamine Hives    Lips swelled     PHYSICAL EXAM:  ECOG Performance status:  1  Vitals:   12/02/18 1100  BP: (!) 141/81  Pulse: 69  Resp: 18  Temp: 98.5 F (36.9 C)  SpO2: 100%   Filed Weights   12/02/18 1100  Weight: 239 lb (108.4 kg)    Physical Exam Constitutional:      Appearance: Normal appearance. She is normal weight.  Cardiovascular:     Rate and Rhythm: Normal rate and regular rhythm.     Heart sounds: Normal heart sounds.  Pulmonary:     Effort: Pulmonary effort is normal.     Breath sounds: Normal breath sounds.  Musculoskeletal: Normal range of motion.  Skin:    General: Skin is warm and dry.  Neurological:     Mental Status: She is alert and oriented to person, place, and time. Mental status is at baseline.  Psychiatric:        Mood and Affect: Mood normal.        Behavior: Behavior normal.        Thought Content: Thought content normal.        Judgment: Judgment normal.      LABORATORY DATA:  I have reviewed the labs as listed.  CBC    Component Value Date/Time   WBC 6.1 11/04/2018 0928   RBC 3.88 11/19/2018 1522   RBC 4.06 11/04/2018 0928   HGB 10.2 (L) 11/04/2018 0928   HCT 36.5 11/04/2018 0928   PLT 297 11/04/2018 0928   MCV 89.9 11/04/2018 0928   MCH 25.1 (L) 11/04/2018 0928   MCHC 27.9 (L) 11/04/2018 0928   RDW 16.5 (H) 11/04/2018 0928   LYMPHSABS 2.5 01/30/2014 1110   MONOABS 0.5 01/30/2014 1110   EOSABS 0.1 01/30/2014 1110   BASOSABS 0.0 01/30/2014 1110   CMP Latest Ref Rng & Units 11/19/2018 11/04/2018 10/26/2018  Glucose 70 - 99 mg/dL 84 91 87  BUN 6 - 20 mg/dL 11 9 13   Creatinine 0.44 - 1.00 mg/dL 0.64 0.77 0.77  Sodium 135 - 145 mmol/L 138 136 138  Potassium 3.5 - 5.1 mmol/L 3.4(L) 4.3 3.9  Chloride 98 - 111 mmol/L 108 106 106  CO2 22 - 32 mmol/L 25 26 25   Calcium 8.9 - 10.3 mg/dL 8.5(L) 8.7(L) 8.5(L)  Total Protein 6.5 - 8.1 g/dL 7.5 - -  Total Bilirubin 0.3 - 1.2 mg/dL 0.5 - -  Alkaline Phos 38 - 126 U/L 51 - -  AST 15 - 41 U/L 24 - -  ALT 0 - 44 U/L 16 - -       DIAGNOSTIC IMAGING:  I have  independently reviewed the scans and discussed with the patient.   I have reviewed Olivia Finders, NP's note and  agree with the documentation.  I personally performed a face-to-face visit, made revisions and my assessment and plan is as follows.    ASSESSMENT & PLAN:   Normocytic anemia 1.  Normocytic anemia: - Most recent blood work on 11/04/2018 shows hemoglobin of 10.2.  MCV was normal.  Ferritin was low at 6. -She denies any bright red blood per rectum.  However she has noticed burgundy colored stools in the last 1 to 2 weeks. -She is scheduled for her first screening colonoscopy in February. - She has ice pica. -She has tried taking iron pills in the past but could not tolerate due to severe constipation. - She was started on Feraheme infusion on 11/25/2018.  She will receive second infusion today. -We reviewed her blood work with her today.  B12 was severely low at 59.  I have recommended B12 monthly shots.  She was also told to take B12 p.o. -We checked her stool which was negative on 3 different occasions. - Other blood work including SPEP, copper level, folic acid, LDH were normal. -We will see her back in 3 months for follow-up with repeat iron panel, ferritin, and B12 along with a CBC.  We will decide whether to continue B12 shots at that time.      Orders placed this encounter:  Orders Placed This Encounter  Procedures  . CBC with Differential/Platelet  . Comprehensive metabolic panel  . Ferritin  . Iron and TIBC  . Vitamin B12  . Folate  . Lactate dehydrogenase      Derek Jack, MD [place] [phone]

## 2018-12-02 NOTE — Progress Notes (Signed)
Patient tolerated iron infusion with no complaints voiced.  Peripheral IV site clean and dry with good blood return noted before and after infusion.  Band aid applied. VSs with discharge and left ambulatory with no s/s of distress noted.  

## 2018-12-03 ENCOUNTER — Ambulatory Visit (INDEPENDENT_AMBULATORY_CARE_PROVIDER_SITE_OTHER): Payer: Managed Care, Other (non HMO) | Admitting: Psychiatry

## 2018-12-03 ENCOUNTER — Encounter (HOSPITAL_COMMUNITY): Payer: Self-pay | Admitting: Psychiatry

## 2018-12-03 DIAGNOSIS — F331 Major depressive disorder, recurrent, moderate: Secondary | ICD-10-CM | POA: Diagnosis not present
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
## 2018-12-07 ENCOUNTER — Ambulatory Visit (HOSPITAL_COMMUNITY)
Admission: RE | Admit: 2018-12-07 | Discharge: 2018-12-07 | Disposition: A | Discharge status: Home / Self Care | Payer: Managed Care, Other (non HMO) | Source: Ambulatory Visit | Attending: Cardiology | Admitting: Cardiology

## 2018-12-07 DIAGNOSIS — R079 Chest pain, unspecified: Secondary | ICD-10-CM | POA: Diagnosis not present

## 2018-12-07 LAB — EXERCISE TOLERANCE TEST
Estimated workload: 6.5 METS
Exercise duration (min): 3 min
Exercise duration (sec): 48 s
MPHR: 17 {beats}/min
Peak HR: 133 {beats}/min
Percent HR: 78 %
RPE: 17
Rest HR: 60 {beats}/min
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
## 2018-12-10 ENCOUNTER — Encounter (HOSPITAL_COMMUNITY): Payer: Self-pay | Admitting: Psychiatry

## 2018-12-10 ENCOUNTER — Telehealth: Payer: Self-pay | Admitting: Cardiology

## 2018-12-10 ENCOUNTER — Ambulatory Visit (INDEPENDENT_AMBULATORY_CARE_PROVIDER_SITE_OTHER): Payer: Managed Care, Other (non HMO) | Admitting: Psychiatry

## 2018-12-10 VITALS — BP 134/86 | HR 80 | Ht 66.0 in | Wt 235.0 lb

## 2018-12-10 DIAGNOSIS — F331 Major depressive disorder, recurrent, moderate: Secondary | ICD-10-CM | POA: Diagnosis not present

## 2018-12-10 DIAGNOSIS — R079 Chest pain, unspecified: Secondary | ICD-10-CM

## 2018-12-10 MED ORDER — QUETIAPINE FUMARATE 50 MG PO TABS: 50.0000 mg | ORAL_TABLET | Freq: Every day | ORAL | 0 refills | Status: DC

## 2018-12-10 MED ORDER — VENLAFAXINE HCL ER 75 MG PO CP24: ORAL_CAPSULE | ORAL | 0 refills | Status: DC

## 2018-12-10 MED ORDER — VENLAFAXINE HCL ER 150 MG PO CP24: ORAL_CAPSULE | ORAL | 0 refills | Status: DC

## 2018-12-10 NOTE — Telephone Encounter (Signed)
Pt agreeable to lexi - orders place and will forward to schedulers - pt aware of holding amlodipine the morning of your test     Stress test was nondiagnostic as she was not able to get her heart rate fast enough on the treadmill. This is ok, we will need to do another type of stress test that doesn't involve exercise to better evalaute her for possible blockages. Please order a lexiscan, hold norvasc day of test    Zandra Abts MD

## 2018-12-10 NOTE — Telephone Encounter (Signed)
Asking for test results

## 2018-12-10 NOTE — Telephone Encounter (Signed)
Pre-cert Verification for the following procedure   Lexiscan scheduled for 12/21/2018 at [facility]

## 2018-12-10 NOTE — Patient Instructions (Signed)
1. Increase venlafaxine 225 mg daily  2. Increase quetiapine 50 mg at night  3. Return to clinic in one month for 30 mins

## 2018-12-18 ENCOUNTER — Encounter (HOSPITAL_COMMUNITY): Payer: Self-pay | Admitting: Psychiatry

## 2018-12-18 ENCOUNTER — Ambulatory Visit (INDEPENDENT_AMBULATORY_CARE_PROVIDER_SITE_OTHER): Payer: 59 | Admitting: Psychiatry

## 2018-12-18 DIAGNOSIS — F331 Major depressive disorder, recurrent, moderate: Secondary | ICD-10-CM
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
## 2018-12-21 ENCOUNTER — Encounter (HOSPITAL_COMMUNITY)
Admission: RE | Admit: 2018-12-21 | Discharge: 2018-12-21 | Disposition: A | Discharge status: Home / Self Care | Payer: Managed Care, Other (non HMO) | Source: Ambulatory Visit | Attending: Cardiology | Admitting: Cardiology

## 2018-12-21 ENCOUNTER — Encounter (HOSPITAL_BASED_OUTPATIENT_CLINIC_OR_DEPARTMENT_OTHER)
Admission: RE | Admit: 2018-12-21 | Discharge: 2018-12-21 | Disposition: A | Discharge status: Home / Self Care | Payer: Managed Care, Other (non HMO) | Source: Ambulatory Visit | Attending: Cardiology | Admitting: Cardiology

## 2018-12-21 ENCOUNTER — Encounter (HOSPITAL_COMMUNITY): Payer: Self-pay

## 2018-12-21 DIAGNOSIS — R079 Chest pain, unspecified: Secondary | ICD-10-CM

## 2018-12-21 LAB — NM MYOCAR MULTI W/SPECT W/WALL MOTION / EF
CHL CUP NUCLEAR SDS: 1
LV dias vol: 93 mL (ref 46–106)
LV sys vol: 33 mL
Peak HR: 96 {beats}/min
RATE: 0.31
Rest HR: 64 {beats}/min
SRS: 4
SSS: 5
TID: 0.87

## 2018-12-21 MED ORDER — REGADENOSON 0.4 MG/5ML IV SOLN
INTRAVENOUS | Status: AC
  Administered 2018-12-21: 0.4 mg via INTRAVENOUS
  Filled 2018-12-21: qty 5

## 2018-12-21 MED ORDER — TECHNETIUM TC 99M TETROFOSMIN IV KIT
10.0000 | PACK | Freq: Once | INTRAVENOUS | Status: AC | PRN
  Administered 2018-12-21: 10 via INTRAVENOUS

## 2018-12-21 MED ORDER — SODIUM CHLORIDE 0.9% FLUSH
INTRAVENOUS | Status: AC
  Administered 2018-12-21: 10 mL via INTRAVENOUS
  Filled 2018-12-21: qty 10

## 2018-12-21 MED ORDER — TECHNETIUM TC 99M TETROFOSMIN IV KIT
30.0000 | PACK | Freq: Once | INTRAVENOUS | Status: AC | PRN
  Administered 2018-12-21: 30.6 via INTRAVENOUS

## 2018-12-25 ENCOUNTER — Telehealth: Payer: Self-pay | Admitting: *Deleted

## 2018-12-25 NOTE — Telephone Encounter (Signed)
Pt voiced understanding - says she is still experiencing SOB - scheduled f/u for 3/18 - routed to pcp

## 2018-12-25 NOTE — Telephone Encounter (Signed)
-----   Message from Arnoldo Lenis, MD sent at 12/23/2018  2:03 PM EST ----- Stress test overall looks good. There is a mild area on the bottom of the heart that is questionable, it may be due to just shadowing from the gut or represent just a very mild area of blockage, either finding is considered to be low risk and typically just monitored or treated with medications. Can she see me back in 3-4 weeks. How are her symptoms doing   J BrancH MD

## 2018-12-28 ENCOUNTER — Encounter (HOSPITAL_COMMUNITY): Payer: Self-pay | Admitting: Psychiatry

## 2018-12-28 ENCOUNTER — Ambulatory Visit (INDEPENDENT_AMBULATORY_CARE_PROVIDER_SITE_OTHER): Payer: 59 | Admitting: Psychiatry

## 2018-12-28 DIAGNOSIS — F331 Major depressive disorder, recurrent, moderate: Secondary | ICD-10-CM
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
## 2019-01-05 ENCOUNTER — Ambulatory Visit (HOSPITAL_COMMUNITY): Payer: Self-pay

## 2019-01-05 ENCOUNTER — Other Ambulatory Visit (HOSPITAL_COMMUNITY): Payer: Self-pay | Admitting: Nurse Practitioner

## 2019-01-05 DIAGNOSIS — D509 Iron deficiency anemia, unspecified: Secondary | ICD-10-CM | POA: Insufficient documentation

## 2019-01-05 DIAGNOSIS — D508 Other iron deficiency anemias: Secondary | ICD-10-CM

## 2019-01-06 ENCOUNTER — Encounter (HOSPITAL_COMMUNITY): Payer: Self-pay

## 2019-01-06 ENCOUNTER — Inpatient Hospital Stay (HOSPITAL_COMMUNITY): Payer: Managed Care, Other (non HMO) | Attending: Hematology

## 2019-01-06 VITALS — BP 149/83 | HR 61 | Temp 97.7°F | Resp 18

## 2019-01-06 DIAGNOSIS — D509 Iron deficiency anemia, unspecified: Secondary | ICD-10-CM | POA: Diagnosis not present

## 2019-01-06 DIAGNOSIS — Z79899 Other long term (current) drug therapy: Secondary | ICD-10-CM | POA: Diagnosis not present

## 2019-01-06 DIAGNOSIS — E538 Deficiency of other specified B group vitamins: Secondary | ICD-10-CM | POA: Insufficient documentation

## 2019-01-06 DIAGNOSIS — D649 Anemia, unspecified: Secondary | ICD-10-CM

## 2019-01-06 MED ORDER — CYANOCOBALAMIN 1000 MCG/ML IJ SOLN
1000.0000 ug | Freq: Once | INTRAMUSCULAR | Status: AC
  Administered 2019-01-06: 1000 ug via INTRAMUSCULAR

## 2019-01-06 MED ORDER — CYANOCOBALAMIN 1000 MCG/ML IJ SOLN
INTRAMUSCULAR | Status: AC
  Filled 2019-01-06: qty 1
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
## 2019-01-06 NOTE — Patient Instructions (Addendum)
[facility] Discharge Instructions  Received Vit B12 injection today. Follow-up as scheduled. Call clinic for any questions or concerns   Thank you for choosing  Can[facility] Center at [facility] to provide your oncology and hematology care.  To afford each patient quality time with our provider, please arrive at least 15 minutes before your scheduled appointment time.   If you have a lab appointment with the Cancer Center please come in thru the  Main Entrance and check in at the main information desk  You need to re-schedule your appointment should you arrive 10 or more minutes late.  We strive to give you quality time with our providers, and arriving late affects you and other patients whose appointments are after yours.  Also, if you no show three or more times for appointments you may be dismissed from the clinic at the providers discretion.     Again, thank you for choosing [facility].  Our hope is that these requests will decrease the amount of time that you wait before being seen by our physicians.       _____________________________________________________________  Should you have questions after your visit to [facility], please contact our office at [phone] between the hours of 8:00 a.m. and 4:30 p.m.  Voicemails left after 4:00 p.m. will not be returned until the following business day.  For prescription refill requests, have your pharmacy contact our office and allow 72 hours.    Cancer Center Support Programs:   > Cancer Support Group  2nd Tuesday of the month 1pm-2pm, Journey Room   

## 2019-01-06 NOTE — Progress Notes (Signed)
Olivia Woodward tolerated Vit B12 injection well without complaints or incident. VSS Pt discharged self ambulatory in satisfactory condition

## 2019-01-07 ENCOUNTER — Encounter: Payer: Self-pay | Admitting: Nurse Practitioner

## 2019-01-07 ENCOUNTER — Other Ambulatory Visit: Payer: Self-pay

## 2019-01-07 ENCOUNTER — Telehealth: Payer: Self-pay

## 2019-01-07 ENCOUNTER — Ambulatory Visit (INDEPENDENT_AMBULATORY_CARE_PROVIDER_SITE_OTHER): Payer: Managed Care, Other (non HMO) | Admitting: Nurse Practitioner

## 2019-01-07 DIAGNOSIS — K5900 Constipation, unspecified: Secondary | ICD-10-CM | POA: Diagnosis not present

## 2019-01-07 DIAGNOSIS — Z1211 Encounter for screening for malignant neoplasm of colon: Secondary | ICD-10-CM

## 2019-01-07 MED ORDER — CLENPIQ 10-3.5-12 MG-GM -GM/160ML PO SOLN: 1.0000 | Freq: Once | ORAL | 0 refills | Status: AC

## 2019-01-07 NOTE — Assessment & Plan Note (Signed)
History of chronic constipation has tried multiple over-the-counter medications which have not helped.  At this point as she become significantly constipated, being that she is lactose intolerant, she simply eats a bowl of cereal.  Is being evaluated by hematology for anemia but heme stool cards were negative.  She did have some brief episodes of maroon stools.  At this point she is due for colonoscopy.  No family history of colon cancer that she is aware of.  We will proceed with colonoscopy at this time.  I will give her samples of Linzess 72 mcg last week.  Request progress report 1 to 2 weeks.  Follow-up in 4 months.  Proceed with TCS on propofol/MAC with Dr. Gala Romney in near future: the risks, benefits, and alternatives have been discussed with the patient in detail. The patient states understanding and desires to proceed.  The patient is currently on Seroquel, Effexor.  No other anticoagulants, anxiolytics, chronic pain medications, or antidepressants.  We will plan for the procedure on propofol/MAC to promote adequate sedation.

## 2019-01-07 NOTE — Progress Notes (Signed)
CC'D TO PCP °

## 2019-01-07 NOTE — Patient Instructions (Signed)
Your health issues we discussed today were:   Constipation: 1. I am giving you samples of Linzess 72 mcg (low-dose).  Take this once a day, for sing in the morning, on an empty stomach 2. Call us in 1 to 2 weeks and let us know if it is helping your constipation  Need for colonoscopy: 1. As we discussed, you are currently due for your colonoscopy.  We will schedule this for you 2. Further recommendations will be made based on your colonoscopy results  Overall I recommend:  1. Return for follow-up in 4 months 2. Call us if you have any questions or concerns.  At [facility] we value your feedback. You may receive a survey about your visit today. Please share your experience as we strive to create trusting relationships with our patients to provide genuine, compassionate, quality care.  We appreciate your understanding and patience as we review any laboratory studies, imaging, and other diagnostic tests that are ordered as we care for you. Our office policy is 5 business days for review of these results, and any emergent or urgent results are addressed in a timely manner for your best interest. If you do not hear from our office in 1 week, please contact us.   We also encourage the use of MyChart, which contains your medical information for your review as well. If you are not enrolled in this feature, an access code is on this after visit summary for your convenience. Thank you for allowing [place] to be involved in your care.  It was great to see you today!  I hope you have a great day!!

## 2019-01-07 NOTE — Telephone Encounter (Signed)
Called and informed pt of pre-op appt 02/10/19 at 9:00am. Letter mailed.

## 2019-01-07 NOTE — Progress Notes (Signed)
Primary Care Physician:  Fayrene Helper, MD Primary Gastroenterologist:  Dr. Gala Romney  Chief Complaint  Patient presents with  . ida  . Consult    TCS. never had prior. Father has hx polyps    HPI:   Olivia Woodward is a 53 y.o. female who presents on referral from primary care for first-ever colonoscopy.  The patient was brought to the office due to iron deficiency anemia.  Reviewed information provided with the referral including office visit dated 10/26/2018.  History of anemia, intolerant to previous oral iron preparation.  She noted fatigue at that time.  Recommended check iron level and refer for IV iron is still low.  Of note, heme stool card was negative x3 on 11/26/2018.  Labs with primary care 10/26/2018 showed CBC with persistent mild anemia and hemoglobin of 10.3 (baseline 9.1-10.4) which was normocytic and mildly hypochromic; iron normal at 102, saturation normal at 23%, ferritin persistently low at 6.  She was referred to hematology.  She has been following with hematology since December 2019.  Noted very low B12 and recommended injections as well as oral supplementation.  No history of colonoscopy in our system.  Today she states she's doing ok overall. Seeing hematology for anemia. Before stool cards she noted hematochezia/burgandy stools. Denies melena. Has chronic constipation, not on any medications but notes she's lactose intolerant so if she's very constipated she'll eat a bowl of cereal. Has previously tried colace, MiraLAX. Drinks lots of water, eats vegetables every day. Denies abdominal pain, N/V, fever, chills, unintentional weight loss. Has intermittent chest pain, is being evaluated by cardiology. Denies dyspnea, dizziness, lightheadedness, syncope, near syncope. Denies any other upper or lower GI symptoms.  Had lexiscan by cardiology with "Stress test overall looks good. There is a mild area on the bottom of the heart that is questionable, it may be due to just  shadowing from the gut or represent just a very mild area of blockage, either finding is considered to be low risk and typically just monitored or treated with medications."  Past Medical History:  Diagnosis Date  . Anemia   . Generalized headaches   . Hypertension     Past Surgical History:  Procedure Laterality Date  . ABDOMINAL HYSTERECTOMY     fibroids, partial  . BREAST REDUCTION SURGERY  2005  . GASTRIC BYPASS  2007    Current Outpatient Medications  Medication Sig Dispense Refill  . amLODipine (NORVASC) 5 MG tablet Take 1 tablet (5 mg total) by mouth daily. 90 tablet 3  . ergocalciferol (VITAMIN D2) 1.25 MG (50000 UT) capsule Take 1 capsule (50,000 Units total) by mouth once a week. One capsule once weekly 12 capsule 3  . QUEtiapine (SEROQUEL) 50 MG tablet Take 1 tablet (50 mg total) by mouth at bedtime. 90 tablet 0  . venlafaxine XR (EFFEXOR-XR) 150 MG 24 hr capsule Take total of 225 mg daily (150 mg+ 75 mg) 90 capsule 0  . venlafaxine XR (EFFEXOR-XR) 75 MG 24 hr capsule Take total of 225 mg daily (150 mg+ 75 mg) 90 capsule 0   No current facility-administered medications for this visit.     Allergies as of 01/07/2019 - Review Complete 01/07/2019  Allergen Reaction Noted  . Ketorolac tromethamine Hives 03/16/2009    Family History  Problem Relation Age of Onset  . Hypertension Mother   . Diabetes Mother   . Drug abuse Mother   . Hypertension Father   . Hypertension Sister   .  Hypertension Sister   . Colon cancer Neg Hx     Social History   Socioeconomic History  . Marital status: Single    Spouse name: Not on file  . Number of children: Not on file  . Years of education: Not on file  . Highest education level: Not on file  Occupational History  . Not on file  Social Needs  . Financial resource strain: Not on file  . Food insecurity:    Worry: Not on file    Inability: Not on file  . Transportation needs:    Medical: Not on file    Non-medical: Not  on file  Tobacco Use  . Smoking status: Never Smoker  . Smokeless tobacco: Never Used  Substance and Sexual Activity  . Alcohol use: No  . Drug use: No  . Sexual activity: Yes    Birth control/protection: Condom  Lifestyle  . Physical activity:    Days per week: Not on file    Minutes per session: Not on file  . Stress: Not on file  Relationships  . Social connections:    Talks on phone: Not on file    Gets together: Not on file    Attends religious service: Not on file    Active member of club or organization: Not on file    Attends meetings of clubs or organizations: Not on file    Relationship status: Not on file  . Intimate partner violence:    Fear of current or ex partner: Not on file    Emotionally abused: Not on file    Physically abused: Not on file    Forced sexual activity: Not on file  Other Topics Concern  . Not on file  Social History Narrative  . Not on file    Review of Systems: General: Negative for anorexia, weight loss, fever, chills, fatigue, weakness. Eyes: Negative for vision changes.  ENT: Negative for hoarseness, difficulty swallowing. CV: Negative for chest pain, angina, palpitations,peripheral edema.  Respiratory: Negative for dyspnea at rest, cough, sputum, wheezing.  GI: See history of present illness. MS: Negative for joint pain, low back pain.  Derm: Negative for rash or itching.  Endo: Negative for unusual weight change.  Heme: Negative for bruising or bleeding. Allergy: Negative for rash or hives.    Physical Exam: BP (!) 146/91   Pulse 75   Temp (!) 96.3 F (35.7 C) (Oral)   Ht 5\' 6"  (1.676 m)   Wt 240 lb 9.6 oz (109.1 kg)   BMI 38.83 kg/m  General:   Alert and oriented. Pleasant and cooperative. Well-nourished and well-developed.  Head:  Normocephalic and atraumatic. Eyes:  Without icterus, sclera clear and conjunctiva pink.  Ears:  Normal auditory acuity. Cardiovascular:  S1, S2 present without murmurs appreciated.  Extremities without clubbing or edema. Respiratory:  Clear to auscultation bilaterally. No wheezes, rales, or rhonchi. No distress.  Gastrointestinal:  +BS, soft, non-tender and non-distended. No HSM noted. No guarding or rebound. No masses appreciated.  Rectal:  Deferred  Musculoskalatal:  Symmetrical without gross deformities.  Neurologic:  Alert and oriented x4;  grossly normal neurologically. Psych:  Alert and cooperative. Normal mood and affect. Heme/Lymph/Immune: No excessive bruising noted.    01/07/2019 9:31 AM   Disclaimer: This note was dictated with voice recognition software. Similar sounding words can inadvertently be transcribed and may not be corrected upon review.

## 2019-01-11 ENCOUNTER — Encounter (HOSPITAL_COMMUNITY): Payer: Self-pay | Admitting: Psychiatry

## 2019-01-11 ENCOUNTER — Ambulatory Visit (INDEPENDENT_AMBULATORY_CARE_PROVIDER_SITE_OTHER): Payer: 59 | Admitting: Psychiatry

## 2019-01-11 DIAGNOSIS — F331 Major depressive disorder, recurrent, moderate: Secondary | ICD-10-CM | POA: Diagnosis not present
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
## 2019-01-12 ENCOUNTER — Ambulatory Visit (INDEPENDENT_AMBULATORY_CARE_PROVIDER_SITE_OTHER): Payer: 59 | Admitting: Psychiatry

## 2019-01-12 ENCOUNTER — Telehealth (HOSPITAL_COMMUNITY): Payer: Self-pay | Admitting: *Deleted

## 2019-01-12 ENCOUNTER — Encounter (HOSPITAL_COMMUNITY): Payer: Self-pay | Admitting: Psychiatry

## 2019-01-12 VITALS — BP 160/94 | HR 71 | Ht 66.0 in | Wt 239.0 lb

## 2019-01-12 DIAGNOSIS — F331 Major depressive disorder, recurrent, moderate: Secondary | ICD-10-CM | POA: Diagnosis not present

## 2019-01-12 MED ORDER — SUVOREXANT 10 MG PO TABS: 10.0000 mg | ORAL_TABLET | Freq: Every evening | ORAL | 0 refills | Status: DC | PRN

## 2019-01-12 MED ORDER — BUPROPION HCL ER (XL) 150 MG PO TB24: 150.0000 mg | ORAL_TABLET | Freq: Every day | ORAL | 0 refills | Status: DC

## 2019-01-12 NOTE — Telephone Encounter (Signed)
[employer] APPROVED BELSOMRA 10 MG TAB                P.A. # [id number]                EFFECTIVE:  01/12/2019      THRU     11/07/2038

## 2019-01-12 NOTE — Patient Instructions (Addendum)
1. Continue venlafaxine 225 mg daily  2. Discontinue quetiapine 3. Start bupropion 150 mg daily  4. Continue belsomra 10 mg at night as needed for sleep  5. Return to clinic in one month for 30 mins

## 2019-01-27 ENCOUNTER — Ambulatory Visit (HOSPITAL_COMMUNITY): Payer: 59 | Admitting: Psychiatry

## 2019-02-03 ENCOUNTER — Encounter: Payer: Self-pay | Admitting: Cardiology

## 2019-02-03 ENCOUNTER — Ambulatory Visit (INDEPENDENT_AMBULATORY_CARE_PROVIDER_SITE_OTHER): Payer: Managed Care, Other (non HMO) | Admitting: Cardiology

## 2019-02-03 ENCOUNTER — Encounter (HOSPITAL_COMMUNITY): Payer: Self-pay

## 2019-02-03 ENCOUNTER — Inpatient Hospital Stay (HOSPITAL_COMMUNITY): Payer: Managed Care, Other (non HMO) | Attending: Hematology

## 2019-02-03 ENCOUNTER — Other Ambulatory Visit: Payer: Self-pay

## 2019-02-03 VITALS — BP 143/84 | HR 62 | Temp 97.8°F | Resp 18

## 2019-02-03 VITALS — BP 129/84 | HR 58 | Temp 99.0°F | Ht 66.0 in | Wt 242.8 lb

## 2019-02-03 DIAGNOSIS — R079 Chest pain, unspecified: Secondary | ICD-10-CM

## 2019-02-03 DIAGNOSIS — D509 Iron deficiency anemia, unspecified: Secondary | ICD-10-CM | POA: Insufficient documentation

## 2019-02-03 DIAGNOSIS — Z79899 Other long term (current) drug therapy: Secondary | ICD-10-CM | POA: Insufficient documentation

## 2019-02-03 DIAGNOSIS — E538 Deficiency of other specified B group vitamins: Secondary | ICD-10-CM | POA: Insufficient documentation

## 2019-02-03 DIAGNOSIS — R002 Palpitations: Secondary | ICD-10-CM

## 2019-02-03 DIAGNOSIS — D649 Anemia, unspecified: Secondary | ICD-10-CM

## 2019-02-03 MED ORDER — CYANOCOBALAMIN 1000 MCG/ML IJ SOLN
1000.0000 ug | Freq: Once | INTRAMUSCULAR | Status: AC
  Administered 2019-02-03: 1000 ug via INTRAMUSCULAR

## 2019-02-03 MED ORDER — CYANOCOBALAMIN 1000 MCG/ML IJ SOLN
INTRAMUSCULAR | Status: AC
  Filled 2019-02-03: qty 1

## 2019-02-03 NOTE — Patient Instructions (Signed)

## 2019-02-03 NOTE — Progress Notes (Signed)
Clinical Summary Ms. Kilker is a 53 y.o.female seen today for follow up of the following medical problems.   1. Chest pain - started about 2 months ago - sharp pain midchest, can go into either shoulders. 8/10 in severity. Can occur at rest or with activity. Feel nauseous, SOB. Unsure if positional. Lasts 2-3 minutes. 3 times a week - stable in severity and frequency since onset - tolerates her housework with troubles, but has some genrealized fatigue.  - no relation to food.   CAD risk factors: HTN   12/2018 nuclear stress: small mild intensity mid to apical inferior and basal inferolateral defect partially reversible, adjacent gut radiotracer uptake may affect findings. Overall low risk  - last episode about 2 weeks ago of chest pain. Overall has been feeling well  2. Palpitations - about 2 months, occurs seperately from the pain - feeling of fluttering, lasts a few seconds. Occurs once a week - coffee x 1, mountain dew occasionally, sweet daily occasionally, no energy drinks, no EtOH  - occasionally with quick movements, or moving in bed has short transient symptoms. Lasts just a few seconds. Past Medical History:  Diagnosis Date  . Anemia   . Generalized headaches   . Hypertension      Allergies  Allergen Reactions  . Ketorolac Tromethamine Hives    Lips swelled     Current Outpatient Medications  Medication Sig Dispense Refill  . amLODipine (NORVASC) 5 MG tablet Take 1 tablet (5 mg total) by mouth daily. 90 tablet 3  . buPROPion (WELLBUTRIN XL) 150 MG 24 hr tablet Take 1 tablet (150 mg total) by mouth daily. 30 tablet 0  . CLENPIQ 10-3.5-12 MG-GM -GM/160ML SOLN     . ergocalciferol (VITAMIN D2) 1.25 MG (50000 UT) capsule Take 1 capsule (50,000 Units total) by mouth once a week. One capsule once weekly 12 capsule 3  . Suvorexant (BELSOMRA) 10 MG TABS Take 10 mg by mouth at bedtime as needed (insomnia). 30 tablet 0  . venlafaxine XR (EFFEXOR-XR) 150 MG 24 hr  capsule Take total of 225 mg daily (150 mg+ 75 mg) 90 capsule 0  . venlafaxine XR (EFFEXOR-XR) 75 MG 24 hr capsule Take total of 225 mg daily (150 mg+ 75 mg) 90 capsule 0   No current facility-administered medications for this visit.      Past Surgical History:  Procedure Laterality Date  . ABDOMINAL HYSTERECTOMY     fibroids, partial  . BREAST REDUCTION SURGERY  2005  . GASTRIC BYPASS  2007     Allergies  Allergen Reactions  . Ketorolac Tromethamine Hives    Lips swelled      Family History  Problem Relation Age of Onset  . Hypertension Mother   . Diabetes Mother   . Drug abuse Mother   . Hypertension Father   . Hypertension Sister   . Hypertension Sister   . Colon cancer Neg Hx      Social History Ms. Hearty reports that she has never smoked. She has never used smokeless tobacco. Ms. Delbridge reports no history of alcohol use.   Review of Systems CONSTITUTIONAL: No weight loss, fever, chills, weakness or fatigue.  HEENT: Eyes: No visual loss, blurred vision, double vision or yellow sclerae.No hearing loss, sneezing, congestion, runny nose or sore throat.  SKIN: No rash or itching.  CARDIOVASCULAR: per hpi RESPIRATORY: No shortness of breath, cough or sputum.  GASTROINTESTINAL: No anorexia, nausea, vomiting or diarrhea. No abdominal pain or blood.  GENITOURINARY: No burning on urination, no polyuria NEUROLOGICAL: No headache, dizziness, syncope, paralysis, ataxia, numbness or tingling in the extremities. No change in bowel or bladder control.  MUSCULOSKELETAL: No muscle, back pain, joint pain or stiffness.  LYMPHATICS: No enlarged nodes. No history of splenectomy.  PSYCHIATRIC: No history of depression or anxiety.  ENDOCRINOLOGIC: No reports of sweating, cold or heat intolerance. No polyuria or polydipsia.  Marland Kitchen   Physical Examination Today's Vitals   02/03/19 1329  BP: 129/84  Pulse: (!) 58  Temp: 99 F (37.2 C)  SpO2: 99%  Weight: 242 lb 12.8 oz (110.1  kg)  Height: 5\' 6"  (1.676 m)   Body mass index is 39.19 kg/m.  Gen: resting comfortably, no acute distress HEENT: no scleral icterus, pupils equal round and reactive, no palptable cervical adenopathy,  CV: RRR, no m/r/g, no jvd Resp: Clear to auscultation bilaterally GI: abdomen is soft, non-tender, non-distended, normal bowel sounds, no hepatosplenomegaly MSK: extremities are warm, no edema.  Skin: warm, no rash Neuro:  no focal deficits Psych: appropriate affect   Diagnostic Studies  Jan 2020 GXT  Blood pressure demonstrated a hypertensive response to exercise.  There was no ST segment deviation noted during stress.   Negative ETT but thought to be non diagnostic as patient Only achieved 78% of PMHR Normal BP response No significant arrhythmia   12/2018 Nuclear stress  No diagnostic ST changes to indicate ischemia.  Small, mild intensity, mid to apical inferior and basal inferolateral defects that are partially reversible in the setting of radiotracer uptake within the gut near the inferior wall reducing the specificity of ischemia in this zone.  This is a low risk study.  Nuclear stress EF: 64%.   Assessment and Plan  1. Chest pain - low risk stress test. Possible small mild ischemia vs artifact, overall considered to be low risk and symptoms remain limited. If progressive symptoms would consider antianginal therapy with beta blocker which would also help with occasional palpitations.   2. Palpitations -mild and infrequent, continue to monitor. If progress would consider event monitor, possibly beta blocker therapy.    F/u 6 months   Arnoldo Lenis, M.D.

## 2019-02-03 NOTE — Progress Notes (Signed)
Soumya R Predmore tolerated Vit B12 injection well without complaints or incident. VSS Pt discharged self ambulatory in satisfactory condition

## 2019-02-03 NOTE — Patient Instructions (Signed)
[facility] at [facility] Discharge Instructions  Received Vit B12 injection today. Follow-up as scheduled. Call clinic for any questions or concerns   Thank you for choosing [facility] at [facility] to provide your oncology and hematology care.  To afford each patient quality time with our provider, please arrive at least 15 minutes before your scheduled appointment time.   If you have a lab appointment with the Cancer Center please come in thru the  Main Entrance and check in at the main information desk  You need to re-schedule your appointment should you arrive 10 or more minutes late.  We strive to give you quality time with our providers, and arriving late affects you and other patients whose appointments are after yours.  Also, if you no show three or more times for appointments you may be dismissed from the clinic at the providers discretion.     Again, thank you for choosing [facility].  Our hope is that these requests will decrease the amount of time that you wait before being seen by our physicians.       _____________________________________________________________  Should you have questions after your visit to [facility], please contact our office at [phone] between the hours of 8:00 a.m. and 4:30 p.m.  Voicemails left after 4:00 p.m. will not be returned until the following business day.  For prescription refill requests, have your pharmacy contact our office and allow 72 hours.    Cancer Center Support Programs:   > Cancer Support Group  2nd Tuesday of the month 1pm-2pm, Journey Room   
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
## 2019-02-08 ENCOUNTER — Telehealth: Payer: Self-pay

## 2019-02-08 NOTE — Telephone Encounter (Signed)
Pre-op appt 03/26/19 at 12:45pm. Letter mailed with procedure instructions.

## 2019-02-08 NOTE — Telephone Encounter (Signed)
Called pt, TCS w/Propofol w/RMR for 02/15/19 rescheduled to 04/01/19 at 8:15am d/t COVID-19 restrictions. LMOVM for endo scheduler. Will mail instructions after pre-op appt is rescheduled.

## 2019-02-10 ENCOUNTER — Encounter (HOSPITAL_COMMUNITY): Admission: RE | Admit: 2019-02-10 | Payer: Managed Care, Other (non HMO) | Source: Ambulatory Visit

## 2019-02-11 ENCOUNTER — Ambulatory Visit (INDEPENDENT_AMBULATORY_CARE_PROVIDER_SITE_OTHER): Payer: Managed Care, Other (non HMO) | Admitting: Psychiatry

## 2019-02-11 ENCOUNTER — Encounter (HOSPITAL_COMMUNITY): Payer: Self-pay | Admitting: Psychiatry

## 2019-02-11 ENCOUNTER — Other Ambulatory Visit: Payer: Self-pay

## 2019-02-11 VITALS — BP 136/85 | HR 85 | Temp 97.5°F | Ht 66.0 in | Wt 241.0 lb

## 2019-02-11 DIAGNOSIS — F5105 Insomnia due to other mental disorder: Secondary | ICD-10-CM

## 2019-02-11 DIAGNOSIS — F330 Major depressive disorder, recurrent, mild: Secondary | ICD-10-CM | POA: Diagnosis not present

## 2019-02-11 DIAGNOSIS — Z79899 Other long term (current) drug therapy: Secondary | ICD-10-CM | POA: Diagnosis not present

## 2019-02-11 MED ORDER — VENLAFAXINE HCL ER 75 MG PO CP24: ORAL_CAPSULE | ORAL | 0 refills | Status: DC

## 2019-02-11 MED ORDER — BUPROPION HCL ER (XL) 150 MG PO TB24: 150.0000 mg | ORAL_TABLET | Freq: Every day | ORAL | 0 refills | Status: DC

## 2019-02-11 MED ORDER — VENLAFAXINE HCL ER 150 MG PO CP24: ORAL_CAPSULE | ORAL | 0 refills | Status: DC

## 2019-02-11 NOTE — Patient Instructions (Signed)
1. Continue venlafaxine 225 mg daily  2. Continue bupropion 150 mg daily  3. Return to clinic in two months for 1 9mins

## 2019-02-25 ENCOUNTER — Other Ambulatory Visit: Payer: Self-pay

## 2019-02-25 ENCOUNTER — Ambulatory Visit (INDEPENDENT_AMBULATORY_CARE_PROVIDER_SITE_OTHER): Payer: 59 | Admitting: Psychiatry

## 2019-02-25 DIAGNOSIS — F330 Major depressive disorder, recurrent, mild: Secondary | ICD-10-CM
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
## 2019-03-02 ENCOUNTER — Other Ambulatory Visit (HOSPITAL_COMMUNITY): Payer: Self-pay

## 2019-03-03 ENCOUNTER — Telehealth: Payer: Self-pay | Admitting: Internal Medicine

## 2019-03-03 NOTE — Telephone Encounter (Signed)
Called and informed pt we will call her if procedure needs to be rescheduled closer to appt date.

## 2019-03-03 NOTE — Telephone Encounter (Signed)
Pt was calling to see if she needed to reschedule her colonoscopy with RMR on 5/14. I told her we were rescheduling and probably haven't got that far out yet. Please advise. [phone]

## 2019-03-08 ENCOUNTER — Ambulatory Visit: Payer: Self-pay | Admitting: Family Medicine

## 2019-03-09 ENCOUNTER — Ambulatory Visit (HOSPITAL_COMMUNITY): Payer: Self-pay | Admitting: Hematology

## 2019-03-09 ENCOUNTER — Ambulatory Visit (HOSPITAL_COMMUNITY): Payer: Self-pay

## 2019-03-17 ENCOUNTER — Ambulatory Visit (INDEPENDENT_AMBULATORY_CARE_PROVIDER_SITE_OTHER): Payer: Self-pay | Admitting: Family Medicine

## 2019-03-17 ENCOUNTER — Other Ambulatory Visit: Payer: Self-pay

## 2019-03-17 ENCOUNTER — Encounter: Payer: Self-pay | Admitting: Family Medicine

## 2019-03-17 VITALS — BP 129/84 | Ht 66.0 in | Wt 242.0 lb

## 2019-03-17 DIAGNOSIS — N898 Other specified noninflammatory disorders of vagina: Secondary | ICD-10-CM | POA: Insufficient documentation

## 2019-03-17 DIAGNOSIS — I10 Essential (primary) hypertension: Secondary | ICD-10-CM

## 2019-03-17 DIAGNOSIS — F411 Generalized anxiety disorder: Secondary | ICD-10-CM

## 2019-03-17 DIAGNOSIS — F322 Major depressive disorder, single episode, severe without psychotic features: Secondary | ICD-10-CM

## 2019-03-17 DIAGNOSIS — L819 Disorder of pigmentation, unspecified: Secondary | ICD-10-CM

## 2019-03-17 NOTE — Assessment & Plan Note (Signed)
3 month h/o pigmented , non pruritic skin lesions on  Neck and anterior chest. Refer to Dermatology for eval and manahgement

## 2019-03-17 NOTE — Assessment & Plan Note (Signed)
Obesity linked with hypertension and depression Unchanged  Patient re-educated about  the importance of commitment to a  minimum of 150 minutes of exercise per week as able.  The importance of healthy food choices with portion control discussed, as well as eating regularly and within a 12 hour window most days. The need to choose "clean , green" food 50 to 75% of the time is discussed, as well as to make water the primary drink and set a goal of 64 ounces water daily.   Weight /BMI 03/17/2019 02/03/2019 01/07/2019  WEIGHT 242 lb 242 lb 12.8 oz 240 lb 9.6 oz  HEIGHT 5\' 6"  5\' 6"  5\' 6"   BMI 39.06 kg/m2 39.19 kg/m2 38.83 kg/m2  Some encounter information is confidential and restricted. Go to Review Flowsheets activity to see all data.

## 2019-03-17 NOTE — Assessment & Plan Note (Signed)
Markedly improved on current therapy, continue same, treated by Psychiatry

## 2019-03-17 NOTE — Progress Notes (Signed)
Virtual Visit via Video Note  I connected with Ltanya Bayley Gerbino on 03/17/19 at  9:00 AM EDT by a video enabled telemedicine application and verified that I am speaking with the correct person using two identifiers.  Location: Patient: home ( working from home) Provider: office   I discussed the limitations of evaluation and management by telemedicine and the availability of in person appointments. The patient expressed understanding and agreed to proceed.  History of Present Illness: F/U chronic problems Rash with dark spots from neck down anterior chest over the past 3 months increasing in affected area C/o nodular lesion inside of vagina, denies discharge and not currently sexually active, not painful, wants this checked Marked improvement in anxiety since going to behavioral health, despite the fact that in the past 2 months she has  had major challenges with her 51 y/o daughter , which have legal implications and involve family member, still trying  To sort this out   Observations/Objective: BP 129/84   Ht 5\' 6"  (1.676 m)   Wt 242 lb (109.8 kg)   SpO2 99%   BMI 39.06 kg/m  Good communication with no confusion and intact memory. Alert and oriented x 3 No signs of respiratory distress during sppech    Assessment and Plan: .Depression, major, single episode, severe (HCC) Marked improvement and controlled on currant medication and benefiting from therapy Continue management by Psychiatry, additional support indicated as she deals with new physical and mental problems with her daughter  Essential hypertension Controlled, no change in medication DASH diet and commitment to daily physical activity for a minimum of 30 minutes discussed and encouraged, as a part of hypertension management. The importance of attaining a healthy weight is also discussed.  BP/Weight 03/17/2019 02/03/2019 02/03/2019 01/07/2019 01/06/2019 12/02/2018 1/74/9449  Systolic BP 675 916 384 665 149 993 570   Diastolic BP 84 84 84 91 83 81 85  Wt. (Lbs) 242 242.8 - 240.6 - 239 -  BMI 39.06 39.19 - 38.83 - 38.58 -  Some encounter information is confidential and restricted. Go to Review Flowsheets activity to see all data.       GAD (generalized anxiety disorder) Markedly improved on current therapy, continue same, treated by Psychiatry  Morbid obesity ([place]) Obesity linked with hypertension and depression Unchanged  Patient re-educated about  the importance of commitment to a  minimum of 150 minutes of exercise per week as able.  The importance of healthy food choices with portion control discussed, as well as eating regularly and within a 12 hour window most days. The need to choose "clean , green" food 50 to 75% of the time is discussed, as well as to make water the primary drink and set a goal of 64 ounces water daily.   Weight /BMI 03/17/2019 02/03/2019 01/07/2019  WEIGHT 242 lb 242 lb 12.8 oz 240 lb 9.6 oz  HEIGHT 5\' 6"  5\' 6"  5\' 6"   BMI 39.06 kg/m2 39.19 kg/m2 38.83 kg/m2  Some encounter information is confidential and restricted. Go to Review Flowsheets activity to see all data.      Pigmented skin lesions 3 month h/o pigmented , non pruritic skin lesions on  Neck and anterior chest. Refer to Dermatology for eval and manahgement  Vaginal lesion 2 month h/o palpable intravaginal lesion, denies vag d/c or recent sexual activity, refer to [facility] for eval    Follow Up Instructions:    I discussed the assessment and treatment plan with the patient. The patient was provided an  opportunity to ask questions and all were answered. The patient agreed with the plan and demonstrated an understanding of the instructions.   The patient was advised to call back or seek an in-person evaluation if the symptoms worsen or if the condition fails to improve as anticipated.  I provided 25 minutes of non-face-to-face time during this encounter.   Tula Nakayama, MD

## 2019-03-17 NOTE — Assessment & Plan Note (Signed)
Marked improvement and controlled on currant medication and benefiting from therapy Continue management by Psychiatry, additional support indicated as she deals with new physical and mental problems with her daughter

## 2019-03-17 NOTE — Assessment & Plan Note (Signed)
2 month h/o palpable intravaginal lesion, denies vag d/c or recent sexual activity, refer to [facility] for eval

## 2019-03-17 NOTE — Assessment & Plan Note (Signed)
Controlled, no change in medication DASH diet and commitment to daily physical activity for a minimum of 30 minutes discussed and encouraged, as a part of hypertension management. The importance of attaining a healthy weight is also discussed.  BP/Weight 03/17/2019 02/03/2019 02/03/2019 01/07/2019 01/06/2019 12/02/2018 2/69/4854  Systolic BP 627 035 009 381 829 937 169  Diastolic BP 84 84 84 91 83 81 85  Wt. (Lbs) 242 242.8 - 240.6 - 239 -  BMI 39.06 39.19 - 38.83 - 38.58 -  Some encounter information is confidential and restricted. Go to Review Flowsheets activity to see all data.

## 2019-03-22 ENCOUNTER — Inpatient Hospital Stay (HOSPITAL_COMMUNITY): Payer: Managed Care, Other (non HMO) | Attending: Hematology

## 2019-03-22 ENCOUNTER — Inpatient Hospital Stay (HOSPITAL_BASED_OUTPATIENT_CLINIC_OR_DEPARTMENT_OTHER): Payer: Managed Care, Other (non HMO) | Admitting: Nurse Practitioner

## 2019-03-22 ENCOUNTER — Inpatient Hospital Stay (HOSPITAL_COMMUNITY): Payer: Managed Care, Other (non HMO)

## 2019-03-22 ENCOUNTER — Other Ambulatory Visit: Payer: Self-pay

## 2019-03-22 VITALS — BP 130/92 | HR 78 | Temp 98.2°F | Resp 18 | Wt 243.4 lb

## 2019-03-22 DIAGNOSIS — K649 Unspecified hemorrhoids: Secondary | ICD-10-CM | POA: Insufficient documentation

## 2019-03-22 DIAGNOSIS — D509 Iron deficiency anemia, unspecified: Secondary | ICD-10-CM | POA: Diagnosis present

## 2019-03-22 DIAGNOSIS — Z9884 Bariatric surgery status: Secondary | ICD-10-CM | POA: Insufficient documentation

## 2019-03-22 DIAGNOSIS — D508 Other iron deficiency anemias: Secondary | ICD-10-CM

## 2019-03-22 DIAGNOSIS — D649 Anemia, unspecified: Secondary | ICD-10-CM

## 2019-03-22 DIAGNOSIS — K5900 Constipation, unspecified: Secondary | ICD-10-CM

## 2019-03-22 DIAGNOSIS — E538 Deficiency of other specified B group vitamins: Secondary | ICD-10-CM | POA: Diagnosis not present

## 2019-03-22 DIAGNOSIS — Z79899 Other long term (current) drug therapy: Secondary | ICD-10-CM | POA: Diagnosis not present

## 2019-03-22 LAB — CBC WITH DIFFERENTIAL/PLATELET
Abs Immature Granulocytes: 0.01 10*3/uL (ref 0.00–0.07)
Basophils Absolute: 0 10*3/uL (ref 0.0–0.1)
Basophils Relative: 0 %
Eosinophils Absolute: 0.1 10*3/uL (ref 0.0–0.5)
Eosinophils Relative: 2 %
HCT: 44.6 % (ref 36.0–46.0)
Hemoglobin: 13.8 g/dL (ref 12.0–15.0)
Immature Granulocytes: 0 %
Lymphocytes Relative: 40 %
Lymphs Abs: 2.2 10*3/uL (ref 0.7–4.0)
MCH: 30.5 pg (ref 26.0–34.0)
MCHC: 30.9 g/dL (ref 30.0–36.0)
MCV: 98.5 fL (ref 80.0–100.0)
Monocytes Absolute: 0.3 10*3/uL (ref 0.1–1.0)
Monocytes Relative: 5 %
Neutro Abs: 3 10*3/uL (ref 1.7–7.7)
Neutrophils Relative %: 53 %
Platelets: 292 10*3/uL (ref 150–400)
RBC: 4.53 MIL/uL (ref 3.87–5.11)
RDW: 12.9 % (ref 11.5–15.5)
WBC: 5.6 10*3/uL (ref 4.0–10.5)
nRBC: 0 % (ref 0.0–0.2)

## 2019-03-22 LAB — COMPREHENSIVE METABOLIC PANEL
ALT: 13 U/L (ref 0–44)
AST: 17 U/L (ref 15–41)
Albumin: 3.7 g/dL (ref 3.5–5.0)
Alkaline Phosphatase: 49 U/L (ref 38–126)
Anion gap: 8 (ref 5–15)
BUN: 8 mg/dL (ref 6–20)
CO2: 25 mmol/L (ref 22–32)
Calcium: 8.8 mg/dL — ABNORMAL LOW (ref 8.9–10.3)
Chloride: 108 mmol/L (ref 98–111)
Creatinine, Ser: 0.75 mg/dL (ref 0.44–1.00)
GFR calc Af Amer: >60 mL/min (ref >60)
GFR calc non Af Amer: >60 mL/min (ref >60)
Glucose, Bld: 99 mg/dL (ref 70–99)
Potassium: 3.6 mmol/L (ref 3.5–5.1)
Sodium: 141 mmol/L (ref 135–145)
Total Bilirubin: 0.8 mg/dL (ref 0.3–1.2)
Total Protein: 7.1 g/dL (ref 6.5–8.1)

## 2019-03-22 LAB — FERRITIN: Ferritin: 10 ng/mL — ABNORMAL LOW (ref 11–307)

## 2019-03-22 LAB — VITAMIN B12: Vitamin B-12: 95 pg/mL — ABNORMAL LOW (ref 180–914)

## 2019-03-22 LAB — IRON AND TIBC
Iron: 108 ug/dL (ref 28–170)
Saturation Ratios: 29 % (ref 10.4–31.8)
TIBC: 372 ug/dL (ref 250–450)
UIBC: 264 ug/dL

## 2019-03-22 LAB — FOLATE: Folate: 7.7 ng/mL (ref >5.9)

## 2019-03-22 LAB — LACTATE DEHYDROGENASE: LDH: 145 U/L (ref 98–192)

## 2019-03-22 MED ORDER — CYANOCOBALAMIN 1000 MCG/ML IJ SOLN
INTRAMUSCULAR | Status: AC
  Filled 2019-03-22: qty 1

## 2019-03-22 MED ORDER — CYANOCOBALAMIN 1000 MCG/ML IJ SOLN
1000.0000 ug | Freq: Once | INTRAMUSCULAR | Status: AC
  Administered 2019-03-22: 1000 ug via INTRAMUSCULAR

## 2019-03-22 NOTE — Progress Notes (Signed)
Patient tolerated injection with no complaints voiced.  Site clean and dry with no bruising or swelling noted at site.  Band aid applied.  Vss with discharge and left ambulatory with no s/s of distress noted.  

## 2019-03-22 NOTE — Progress Notes (Signed)
Olivia Woodward, Olivia Woodward [id number]   CLINIC:  Medical Oncology/Hematology  PCP:  Fayrene Helper, MD [address], [place] [address] [phone]   REASON FOR VISIT: Follow-up for normocytic anemia.  CURRENT THERAPY: Intermittent iron infusions  INTERVAL HISTORY:  Olivia Woodward 53 y.o. female returns for routine follow-up for anemia.  She reports she has been doing well since her last visit.  She reports the iron infusion she got in January has helped her fatigue.  She reports she still sees intermittent blood in her stool due to constipation and hemorrhoids.  She reports it is occasional and not every day.  Otherwise she is feeling better than before. Denies any nausea, vomiting, or diarrhea. Denies any new pains. Had not noticed any recent bleeding such as epistaxis, hematuria or hematochezia. Denies recent chest pain on exertion, shortness of breath on minimal exertion, pre-syncopal episodes, or palpitations. Denies any numbness or tingling in hands or feet. Denies any recent fevers, infections, or recent hospitalizations. Patient reports appetite at 100% and energy level at 50%.  She is eating well and maintaining her weight at this time.    REVIEW OF SYSTEMS:  Review of Systems  Constitutional: Positive for fatigue.  Gastrointestinal: Positive for constipation.  Neurological: Positive for headaches.  All other systems reviewed and are negative.    PAST MEDICAL/SURGICAL HISTORY:  Past Medical History:  Diagnosis Date  . Anemia   . Generalized headaches   . Hypertension    Past Surgical History:  Procedure Laterality Date  . ABDOMINAL HYSTERECTOMY     fibroids, partial  . BREAST REDUCTION SURGERY  2005  . GASTRIC BYPASS  2007     SOCIAL HISTORY:  Social History   Socioeconomic History  . Marital status: Single    Spouse name: Not on file  . Number of children: Not on file  . Years of education: Not on file  .  Highest education level: Not on file  Occupational History  . Not on file  Social Needs  . Financial resource strain: Not on file  . Food insecurity:    Worry: Not on file    Inability: Not on file  . Transportation needs:    Medical: Not on file    Non-medical: Not on file  Tobacco Use  . Smoking status: Never Smoker  . Smokeless tobacco: Never Used  Substance and Sexual Activity  . Alcohol use: No  . Drug use: No  . Sexual activity: Yes    Birth control/protection: Condom  Lifestyle  . Physical activity:    Days per week: Not on file    Minutes per session: Not on file  . Stress: Not on file  Relationships  . Social connections:    Talks on phone: Not on file    Gets together: Not on file    Attends religious service: Not on file    Active member of club or organization: Not on file    Attends meetings of clubs or organizations: Not on file    Relationship status: Not on file  . Intimate partner violence:    Fear of current or ex partner: Not on file    Emotionally abused: Not on file    Physically abused: Not on file    Forced sexual activity: Not on file  Other Topics Concern  . Not on file  Social History Narrative  . Not on file    FAMILY HISTORY:  Family History  Problem Relation Age of Onset  . Hypertension Mother   . Diabetes Mother   . Drug abuse Mother   . Hypertension Father   . Hypertension Sister   . Hypertension Sister   . Colon cancer Neg Hx     CURRENT MEDICATIONS:  Outpatient Encounter Medications as of 03/22/2019  Medication Sig  . amLODipine (NORVASC) 5 MG tablet Take 1 tablet (5 mg total) by mouth daily.  Marland Kitchen buPROPion (WELLBUTRIN XL) 150 MG 24 hr tablet Take 1 tablet (150 mg total) by mouth daily.  . Suvorexant (BELSOMRA) 10 MG TABS Take 10 mg by mouth at bedtime as needed (insomnia).  . venlafaxine XR (EFFEXOR-XR) 150 MG 24 hr capsule Take total of 225 mg daily (150 mg+ 75 mg)  . venlafaxine XR (EFFEXOR-XR) 75 MG 24 hr capsule Take  total of 225 mg daily (150 mg+ 75 mg)  . CLENPIQ 10-3.5-12 MG-GM -GM/160ML SOLN   . ergocalciferol (VITAMIN D2) 1.25 MG (50000 UT) capsule Take 1 capsule (50,000 Units total) by mouth once a week. One capsule once weekly (Patient not taking: Reported on 03/22/2019)  . ibuprofen (ADVIL) 800 MG tablet Take 800 mg by mouth every 8 (eight) hours as needed.  . [EXPIRED] cyanocobalamin ((VITAMIN B-12)) injection 1,000 mcg    No facility-administered encounter medications on file as of 03/22/2019.     ALLERGIES:  Allergies  Allergen Reactions  . Ketorolac Tromethamine Hives    Lips swelled     PHYSICAL EXAM:  ECOG Performance status: 1  Vitals:   03/22/19 1254  BP: (!) 130/92  Pulse: 78  Resp: 18  Temp: 98.2 F (36.8 C)  SpO2: 98%   Filed Weights   03/22/19 1254  Weight: 243 lb 6.4 oz (110.4 kg)    Physical Exam Constitutional:      Appearance: Normal appearance. She is normal weight.  Cardiovascular:     Rate and Rhythm: Normal rate and regular rhythm.     Heart sounds: Normal heart sounds.  Pulmonary:     Effort: Pulmonary effort is normal.     Breath sounds: Normal breath sounds.  Abdominal:     General: Bowel sounds are normal.     Palpations: Abdomen is soft.  Musculoskeletal: Normal range of motion.  Skin:    General: Skin is warm and dry.  Neurological:     Mental Status: She is alert and oriented to person, place, and time. Mental status is at baseline.  Psychiatric:        Mood and Affect: Mood normal.        Behavior: Behavior normal.        Thought Content: Thought content normal.        Judgment: Judgment normal.      LABORATORY DATA:  I have reviewed the labs as listed.  CBC    Component Value Date/Time   WBC 5.6 03/22/2019 1219   RBC 4.53 03/22/2019 1219   HGB 13.8 03/22/2019 1219   HCT 44.6 03/22/2019 1219   PLT 292 03/22/2019 1219   MCV 98.5 03/22/2019 1219   MCH 30.5 03/22/2019 1219   MCHC 30.9 03/22/2019 1219   RDW 12.9 03/22/2019 1219    LYMPHSABS 2.2 03/22/2019 1219   MONOABS 0.3 03/22/2019 1219   EOSABS 0.1 03/22/2019 1219   BASOSABS 0.0 03/22/2019 1219   CMP Latest Ref Rng & Units 03/22/2019 11/19/2018 11/04/2018  Glucose 70 - 99 mg/dL 99 84 91  BUN 6 - 20 mg/dL 8 11  9  Creatinine 0.44 - 1.00 mg/dL 0.75 0.64 0.77  Sodium 135 - 145 mmol/L 141 138 136  Potassium 3.5 - 5.1 mmol/L 3.6 3.4(L) 4.3  Chloride 98 - 111 mmol/L 108 108 106  CO2 22 - 32 mmol/L 25 25 26   Calcium 8.9 - 10.3 mg/dL 8.8(L) 8.5(L) 8.7(L)  Total Protein 6.5 - 8.1 g/dL 7.1 7.5 -  Total Bilirubin 0.3 - 1.2 mg/dL 0.8 0.5 -  Alkaline Phos 38 - 126 U/L 49 51 -  AST 15 - 41 U/L 17 24 -  ALT 0 - 44 U/L 13 16 -     I personally performed a face-to-face visit.  All questions were answered to patient's stated satisfaction. Encouraged patient to call with any new concerns or questions before his next visit to the cancer center and we can certain see him sooner, if needed.     ASSESSMENT & PLAN:   Iron deficiency anemia 1.  Normocytic anemia: - Her last Feraheme infusions was on 11/25/2018 and 12/02/2018. -She tried oral iron supplements but was unable to tolerate them due to constipation. - She denies any bright red blood per rectum. 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 - She reported she had burgundy color stools occasionally.  She reports she is constipated and has hemorrhoids.  However we checked her stool for occult blood it was negative on 3 different occasions. - She was due for a colonoscopy in February 2020.  It was canceled due to the COVID crisis.  They are going to reschedule in a few months. -Labs on 03/22/2019 hemoglobin 13.8.  Ferritin was 10 , percent saturation was 29. -We will hold off on iron infusion at this time since her hemoglobin is 13.8 and she is feeling great. -We will see her back in 3 months with repeat labs.  2.  B12 deficiency: - She was placed on oral B12 supplements due to her vitamin B12 level 59.  Then switch to monthly B12  injections. -Labs on 03/22/2019 her vitamin B12 was - She was started on B12 injections she will continue this at this time. -We will see her back in 3 months with repeat labs      Orders placed this encounter:  Orders Placed This Encounter  Procedures  . Lactate dehydrogenase  . CBC with Differential/Platelet  . Comprehensive metabolic panel  . Ferritin  . Iron and TIBC  . Vitamin B12  . VITAMIN D 25 Hydroxy (Vit-D Deficiency, Fractures)  . Folate      Francene Finders, FNP-C [place] [phone]

## 2019-03-22 NOTE — Assessment & Plan Note (Addendum)
1.  Normocytic anemia: - Her last Feraheme infusions was on 11/25/2018 and 12/02/2018. -She tried oral iron supplements but was unable to tolerate them due to constipation. - She denies any bright red blood per rectum. 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 - She reported she had burgundy color stools occasionally.  She reports she is constipated and has hemorrhoids.  However we checked her stool for occult blood it was negative on 3 different occasions. - She was due for a colonoscopy in February 2020.  It was canceled due to the COVID crisis.  They are going to reschedule in a few months. -Labs on 03/22/2019 hemoglobin 13.8.  Ferritin was 10 , percent saturation was 29. -We will hold off on iron infusion at this time since her hemoglobin is 13.8 and she is feeling great. -We will see her back in 3 months with repeat labs.  2.  B12 deficiency: - She was placed on oral B12 supplements due to her vitamin B12 level 59.  Then switch to monthly B12 injections. -Labs on 03/22/2019 her vitamin B12 was - She was started on B12 injections she will continue this at this time. -We will see her back in 3 months with repeat labs

## 2019-03-22 NOTE — Patient Instructions (Signed)
[place] at [facility] Discharge Instructions  Follow up in 3 months with labs. Continue with monthly B12 injections.    Thank you for choosing [place] at [facility] to provide your oncology and hematology care.  To afford each patient quality time with our provider, please arrive at least 15 minutes before your scheduled appointment time.   If you have a lab appointment with the [facility] please come in thru the  Main Entrance and check in at the main information desk  You need to re-schedule your appointment should you arrive 10 or more minutes late.  We strive to give you quality time with our providers, and arriving late affects you and other patients whose appointments are after yours.  Also, if you no show three or more times for appointments you may be dismissed from the clinic at the providers discretion.     Again, thank you for choosing [facility].  Our hope is that these requests will decrease the amount of time that you wait before being seen by our physicians.       _____________________________________________________________  Should you have questions after your visit to [facility], please contact our office at [phone] between the hours of 8:00 a.m. and 4:30 p.m.  Voicemails left after 4:00 p.m. will not be returned until the following business day.  For prescription refill requests, have your pharmacy contact our office and allow 72 hours.    Cancer Center Support Programs:   > Cancer Support Group  2nd Tuesday of the month 1pm-2pm, Journey Room

## 2019-03-26 ENCOUNTER — Other Ambulatory Visit (HOSPITAL_COMMUNITY): Payer: Managed Care, Other (non HMO)
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
## 2019-04-14 ENCOUNTER — Ambulatory Visit (HOSPITAL_COMMUNITY): Payer: Self-pay | Admitting: Psychiatry

## 2019-04-14 ENCOUNTER — Ambulatory Visit (INDEPENDENT_AMBULATORY_CARE_PROVIDER_SITE_OTHER): Payer: 59 | Admitting: Psychiatry

## 2019-04-14 ENCOUNTER — Other Ambulatory Visit: Payer: Self-pay

## 2019-04-14 ENCOUNTER — Encounter (HOSPITAL_COMMUNITY): Payer: Self-pay | Admitting: Psychiatry

## 2019-04-14 DIAGNOSIS — F330 Major depressive disorder, recurrent, mild: Secondary | ICD-10-CM

## 2019-04-14 MED ORDER — BUPROPION HCL ER (XL) 150 MG PO TB24: 150.0000 mg | ORAL_TABLET | Freq: Every day | ORAL | 0 refills | Status: DC

## 2019-04-14 MED ORDER — VENLAFAXINE HCL ER 75 MG PO CP24: ORAL_CAPSULE | ORAL | 0 refills | Status: DC

## 2019-04-14 MED ORDER — VENLAFAXINE HCL ER 150 MG PO CP24: ORAL_CAPSULE | ORAL | 0 refills | Status: DC

## 2019-04-14 NOTE — Patient Instructions (Signed)
1.Continuevenlafaxine 225 mg daily 2.Continuebupropion 150 mg daily  3.Next appointment: 8/17 at 8:40

## 2019-04-20 ENCOUNTER — Telehealth (HOSPITAL_COMMUNITY): Payer: Self-pay | Admitting: *Deleted

## 2019-04-20 NOTE — Telephone Encounter (Signed)
CHECKING ON APPT

## 2019-04-21 ENCOUNTER — Other Ambulatory Visit: Payer: Self-pay

## 2019-04-21 ENCOUNTER — Encounter: Payer: Self-pay | Admitting: Family Medicine

## 2019-04-22 ENCOUNTER — Ambulatory Visit (HOSPITAL_COMMUNITY): Payer: Managed Care, Other (non HMO)

## 2019-04-22 ENCOUNTER — Telehealth: Payer: Self-pay

## 2019-04-22 NOTE — Telephone Encounter (Signed)
Spoke with patient. She has taken Benadryl twice with no relief. appt scheduled to see Cherly Beach on 6/5 at 11:20am

## 2019-04-22 NOTE — Telephone Encounter (Signed)
-----   Message from Perlie Mayo, NP sent at 04/22/2019  4:18 PM EDT ----- Regarding: RE: pls see msg and try for  virtual visit today or tomorrow with Jarrett Soho pls, if Jarrett Soho has availability Lets call ms Osika,  Advise her to take benadryl tonight if no improvement tomorrow I will try to work her in. But let her know it will be a work in and she might have a wait.  Thanks a bunch  ----- Message ----- From: Fayrene Helper, MD Sent: 04/22/2019   2:56 PM EDT To: Perlie Mayo, NP, Theda Sers May Subject: pls see msg and try for  virtual visit today#

## 2019-04-23 ENCOUNTER — Ambulatory Visit: Payer: Managed Care, Other (non HMO) | Admitting: Family Medicine

## 2019-04-27 ENCOUNTER — Ambulatory Visit: Payer: Managed Care, Other (non HMO) | Admitting: Family Medicine

## 2019-04-29 ENCOUNTER — Other Ambulatory Visit: Payer: Self-pay

## 2019-04-29 ENCOUNTER — Ambulatory Visit (HOSPITAL_COMMUNITY): Payer: 59 | Admitting: Psychiatry

## 2019-04-29 NOTE — Progress Notes (Signed)
Patient and therapist agreed to reschedule visit as patient had to go to work.

## 2019-05-03 ENCOUNTER — Other Ambulatory Visit: Payer: Self-pay

## 2019-05-04 ENCOUNTER — Encounter (HOSPITAL_COMMUNITY)
Admission: RE | Admit: 2019-05-04 | Discharge: 2019-05-04 | Disposition: A | Discharge status: Home / Self Care | Payer: Managed Care, Other (non HMO) | Source: Ambulatory Visit | Attending: Internal Medicine | Admitting: Internal Medicine

## 2019-05-06 ENCOUNTER — Ambulatory Visit: Payer: Self-pay | Admitting: Nurse Practitioner

## 2019-05-06 ENCOUNTER — Other Ambulatory Visit: Payer: Self-pay

## 2019-05-06 ENCOUNTER — Other Ambulatory Visit (HOSPITAL_COMMUNITY)
Admission: RE | Admit: 2019-05-06 | Discharge: 2019-05-06 | Disposition: A | Discharge status: Home / Self Care | Payer: Managed Care, Other (non HMO) | Source: Ambulatory Visit | Attending: Internal Medicine | Admitting: Internal Medicine

## 2019-05-06 DIAGNOSIS — Z1159 Encounter for screening for other viral diseases: Secondary | ICD-10-CM | POA: Insufficient documentation

## 2019-05-07 LAB — NOVEL CORONAVIRUS, NAA (HOSP ORDER, SEND-OUT TO REF LAB; TAT 18-24 HRS): SARS-CoV-2, NAA: NOT DETECTED

## 2019-05-10 ENCOUNTER — Encounter (HOSPITAL_COMMUNITY)
Admission: RE | Disposition: A | Discharge status: Home / Self Care | Payer: Self-pay | Source: Home / Self Care | Attending: Internal Medicine

## 2019-05-10 ENCOUNTER — Encounter (HOSPITAL_COMMUNITY): Payer: Self-pay

## 2019-05-10 ENCOUNTER — Ambulatory Visit (HOSPITAL_COMMUNITY): Payer: Managed Care, Other (non HMO) | Admitting: Certified Registered Nurse Anesthetist

## 2019-05-10 ENCOUNTER — Ambulatory Visit (HOSPITAL_COMMUNITY)
Admission: RE | Admit: 2019-05-10 | Discharge: 2019-05-10 | Disposition: A | Discharge status: Home / Self Care | Payer: Managed Care, Other (non HMO) | Attending: Internal Medicine | Admitting: Internal Medicine

## 2019-05-10 ENCOUNTER — Other Ambulatory Visit: Payer: Self-pay

## 2019-05-10 DIAGNOSIS — Z8249 Family history of ischemic heart disease and other diseases of the circulatory system: Secondary | ICD-10-CM | POA: Diagnosis not present

## 2019-05-10 DIAGNOSIS — Z833 Family history of diabetes mellitus: Secondary | ICD-10-CM | POA: Insufficient documentation

## 2019-05-10 DIAGNOSIS — Z1211 Encounter for screening for malignant neoplasm of colon: Secondary | ICD-10-CM | POA: Insufficient documentation

## 2019-05-10 DIAGNOSIS — R51 Headache: Secondary | ICD-10-CM | POA: Insufficient documentation

## 2019-05-10 DIAGNOSIS — D122 Benign neoplasm of ascending colon: Secondary | ICD-10-CM | POA: Diagnosis not present

## 2019-05-10 DIAGNOSIS — D125 Benign neoplasm of sigmoid colon: Secondary | ICD-10-CM | POA: Insufficient documentation

## 2019-05-10 DIAGNOSIS — I10 Essential (primary) hypertension: Secondary | ICD-10-CM | POA: Insufficient documentation

## 2019-05-10 DIAGNOSIS — D649 Anemia, unspecified: Secondary | ICD-10-CM | POA: Diagnosis not present

## 2019-05-10 DIAGNOSIS — F329 Major depressive disorder, single episode, unspecified: Secondary | ICD-10-CM | POA: Diagnosis not present

## 2019-05-10 DIAGNOSIS — Z9884 Bariatric surgery status: Secondary | ICD-10-CM | POA: Insufficient documentation

## 2019-05-10 DIAGNOSIS — Z6837 Body mass index (BMI) 37.0-37.9, adult: Secondary | ICD-10-CM | POA: Insufficient documentation

## 2019-05-10 DIAGNOSIS — Z79899 Other long term (current) drug therapy: Secondary | ICD-10-CM | POA: Insufficient documentation

## 2019-05-10 DIAGNOSIS — Z791 Long term (current) use of non-steroidal anti-inflammatories (NSAID): Secondary | ICD-10-CM | POA: Insufficient documentation

## 2019-05-10 DIAGNOSIS — Z90710 Acquired absence of both cervix and uterus: Secondary | ICD-10-CM | POA: Insufficient documentation

## 2019-05-10 DIAGNOSIS — Z8371 Family history of colonic polyps: Secondary | ICD-10-CM | POA: Diagnosis not present

## 2019-05-10 DIAGNOSIS — Z888 Allergy status to other drugs, medicaments and biological substances status: Secondary | ICD-10-CM | POA: Insufficient documentation

## 2019-05-10 DIAGNOSIS — K5900 Constipation, unspecified: Secondary | ICD-10-CM | POA: Diagnosis not present

## 2019-05-10 DIAGNOSIS — K635 Polyp of colon: Secondary | ICD-10-CM

## 2019-05-10 HISTORY — PX: POLYPECTOMY: SHX5525

## 2019-05-10 HISTORY — PX: COLONOSCOPY WITH PROPOFOL: SHX5780

## 2019-05-10 SURGERY — COLONOSCOPY WITH PROPOFOL
Anesthesia: Monitor Anesthesia Care

## 2019-05-10 MED ORDER — FENTANYL CITRATE (PF) 100 MCG/2ML IJ SOLN
INTRAMUSCULAR | Status: DC | PRN
  Administered 2019-05-10: 50 ug via INTRAVENOUS

## 2019-05-10 MED ORDER — CHLORHEXIDINE GLUCONATE CLOTH 2 % EX PADS: 6.0000 | MEDICATED_PAD | Freq: Once | CUTANEOUS | Status: DC

## 2019-05-10 MED ORDER — LACTATED RINGERS IV SOLN: INTRAVENOUS | Status: DC

## 2019-05-10 MED ORDER — PROPOFOL 10 MG/ML IV BOLUS
INTRAVENOUS | Status: DC | PRN
  Administered 2019-05-10: 40 mg via INTRAVENOUS
  Administered 2019-05-10: 60 mg via INTRAVENOUS

## 2019-05-10 MED ORDER — HYDROMORPHONE HCL 1 MG/ML IJ SOLN
0.2500 mg | INTRAMUSCULAR | Status: DC | PRN
  Administered 2019-05-10: 0.5 mg via INTRAVENOUS
  Filled 2019-05-10: qty 0.5

## 2019-05-10 MED ORDER — MEPERIDINE HCL 50 MG/ML IJ SOLN: 6.2500 mg | INTRAMUSCULAR | Status: DC | PRN

## 2019-05-10 MED ORDER — HYDROCODONE-ACETAMINOPHEN 7.5-325 MG PO TABS
1.0000 | ORAL_TABLET | Freq: Once | ORAL | Status: AC | PRN
  Administered 2019-05-10: 1 via ORAL
  Filled 2019-05-10: qty 1

## 2019-05-10 MED ORDER — FENTANYL CITRATE (PF) 100 MCG/2ML IJ SOLN
INTRAMUSCULAR | Status: AC
  Filled 2019-05-10: qty 2

## 2019-05-10 MED ORDER — LIDOCAINE HCL (PF) 2 % IJ SOLN
INTRAMUSCULAR | Status: AC
  Filled 2019-05-10: qty 10

## 2019-05-10 MED ORDER — PROMETHAZINE HCL 25 MG/ML IJ SOLN: 6.2500 mg | INTRAMUSCULAR | Status: DC | PRN

## 2019-05-10 MED ORDER — LIDOCAINE HCL (CARDIAC) PF 100 MG/5ML IV SOSY
PREFILLED_SYRINGE | INTRAVENOUS | Status: DC | PRN
  Administered 2019-05-10: 50 mg via INTRAVENOUS

## 2019-05-10 MED ORDER — PROPOFOL 500 MG/50ML IV EMUL
INTRAVENOUS | Status: DC | PRN
  Administered 2019-05-10: 100 ug/kg/min via INTRAVENOUS

## 2019-05-10 MED ORDER — LACTATED RINGERS IV SOLN
INTRAVENOUS | Status: DC
  Administered 2019-05-10: 09:00:00 via INTRAVENOUS

## 2019-05-10 MED ORDER — PROPOFOL 10 MG/ML IV BOLUS
INTRAVENOUS | Status: AC
  Filled 2019-05-10: qty 40

## 2019-05-10 NOTE — Discharge Instructions (Signed)
Colonoscopy Discharge Instructions  Read the instructions outlined below and refer to this sheet in the next few weeks. These discharge instructions provide you with general information on caring for yourself after you leave the hospital. Your doctor may also give you specific instructions. While your treatment has been planned according to the most current medical practices available, unavoidable complications occasionally occur. If you have any problems or questions after discharge, call Dr. Gala Romney at [phone]. ACTIVITY  You may resume your regular activity, but move at a slower pace for the next 24 hours.   Take frequent rest periods for the next 24 hours.   Walking will help get rid of the air and reduce the bloated feeling in your belly (abdomen).   No driving for 24 hours (because of the medicine (anesthesia) used during the test).    Do not sign any important legal documents or operate any machinery for 24 hours (because of the anesthesia used during the test).  NUTRITION  Drink plenty of fluids.   You may resume your normal diet as instructed by your doctor.   Begin with a light meal and progress to your normal diet. Heavy or fried foods are harder to digest and may make you feel sick to your stomach (nauseated).   Avoid alcoholic beverages for 24 hours or as instructed.  MEDICATIONS  You may resume your normal medications unless your doctor tells you otherwise.  WHAT YOU CAN EXPECT TODAY  Some feelings of bloating in the abdomen.   Passage of more gas than usual.   Spotting of blood in your stool or on the toilet paper.  IF YOU HAD POLYPS REMOVED DURING THE COLONOSCOPY:  No aspirin products for 7 days or as instructed.   No alcohol for 7 days or as instructed.   Eat a soft diet for the next 24 hours.  FINDING OUT THE RESULTS OF YOUR TEST Not all test results are available during your visit. If your test results are not back during the visit, make an appointment  with your caregiver to find out the results. Do not assume everything is normal if you have not heard from your caregiver or the medical facility. It is important for you to follow up on all of your test results.  SEEK IMMEDIATE MEDICAL ATTENTION IF:  You have more than a spotting of blood in your stool.   Your belly is swollen (abdominal distention).   You are nauseated or vomiting.   You have a temperature over 101.   You have abdominal pain or discomfort that is severe or gets worse throughout the day.   Colon polyp information provided  Further recommendations to follow pending review of pathology report  At patient's request, I called son and conveyed results.   Colon Polyps  Polyps are tissue growths inside the body. Polyps can grow in many places, including the large intestine (colon). A polyp may be a round bump or a mushroom-shaped growth. You could have one polyp or several. Most colon polyps are noncancerous (benign). However, some colon polyps can become cancerous over time. Finding and removing the polyps early can help prevent this. What are the causes? The exact cause of colon polyps is not known. What increases the risk? You are more likely to develop this condition if you:  Have a family history of colon cancer or colon polyps.  Are older than 38 or older than 45 if you are African American.  Have inflammatory bowel disease, such as ulcerative colitis  or Crohn's disease.  Have certain hereditary conditions, such as: ? Familial adenomatous polyposis. ? Lynch syndrome. ? Turcot syndrome. ? Peutz-Jeghers syndrome.  Are overweight.  Smoke cigarettes.  Do not get enough exercise.  Drink too much alcohol.  Eat a diet that is high in fat and red meat and low in fiber.  Had childhood cancer that was treated with abdominal radiation. What are the signs or symptoms? Most polyps do not cause symptoms. If you have symptoms, they may include:  Blood coming  from your rectum when having a bowel movement.  Blood in your stool. The stool may look dark red or black.  Abdominal pain.  A change in bowel habits, such as constipation or diarrhea. How is this diagnosed? This condition is diagnosed with a colonoscopy. This is a procedure in which a lighted, flexible scope is inserted into the anus and then passed into the colon to examine the area. Polyps are sometimes found when a colonoscopy is done as part of routine cancer screening tests. How is this treated? Treatment for this condition involves removing any polyps that are found. Most polyps can be removed during a colonoscopy. Those polyps will then be tested for cancer. Additional treatment may be needed depending on the results of testing. Follow these instructions at home: Lifestyle  Maintain a healthy weight, or lose weight if recommended by your health care provider.  Exercise every day or as told by your health care provider.  Do not use any products that contain nicotine or tobacco, such as cigarettes and e-cigarettes. If you need help quitting, ask your health care provider.  If you drink alcohol, limit how much you have: ? 0-1 drink a day for women. ? 0-2 drinks a day for men.  Be aware of how much alcohol is in your drink. In the [place] one drink equals one 12 oz bottle of beer (355 mL), one 5 oz glass of wine (148 mL), or one 1 oz shot of hard liquor (44 mL). Eating and drinking   Eat foods that are high in fiber, such as fruits, vegetables, and whole grains.  Eat foods that are high in calcium and vitamin D, such as milk, cheese, yogurt, eggs, liver, fish, and broccoli.  Limit foods that are high in fat, such as fried foods and desserts.  Limit the amount of red meat and processed meat you eat, such as hot dogs, sausage, bacon, and lunch meats. General instructions  Keep all follow-up visits as told by your health care provider. This is important. ? This includes having  regularly scheduled colonoscopies. ? Talk to your health care provider about when you need a colonoscopy. Contact a health care provider if:  You have new or worsening bleeding during a bowel movement.  You have new or increased blood in your stool.  You have a change in bowel habits.  You lose weight for no known reason. Summary  Polyps are tissue growths inside the body. Polyps can grow in many places, including the colon.  Most colon polyps are noncancerous (benign), but some can become cancerous over time.  This condition is diagnosed with a colonoscopy.  Treatment for this condition involves removing any polyps that are found. Most polyps can be removed during a colonoscopy. This information is not intended to replace advice given to you by your health care provider. Make sure you discuss any questions you have with your health care provider. Document Released: 07/31/2004 Document Revised: 02/19/2018 Document Reviewed: 02/19/2018 Elsevier Interactive  Patient Education  2019 [place], Care After These instructions provide you with information about caring for yourself after your procedure. Your health care provider may also give you more specific instructions. Your treatment has been planned according to current medical practices, but problems sometimes occur. Call your health care provider if you have any problems or questions after your procedure. What can I expect after the procedure? After your procedure, you may:  Feel sleepy for several hours.  Feel clumsy and have poor balance for several hours.  Feel forgetful about what happened after the procedure.  Have poor judgment for several hours.  Feel nauseous or vomit.  Have a sore throat if you had a breathing tube during the procedure. Follow these instructions at home: For at least 24 hours after the procedure:      Have a responsible adult stay with you. It is important to  have someone help care for you until you are awake and alert.  Rest as needed.  Do not: ? Participate in activities in which you could fall or become injured. ? Drive. ? Use heavy machinery. ? Drink alcohol. ? Take sleeping pills or medicines that cause drowsiness. ? Make important decisions or sign legal documents. ? Take care of children on your own. Eating and drinking  Follow the diet that is recommended by your health care provider.  If you vomit, drink water, juice, or soup when you can drink without vomiting.  Make sure you have little or no nausea before eating solid foods. General instructions  Take over-the-counter and prescription medicines only as told by your health care provider.  If you have sleep apnea, surgery and certain medicines can increase your risk for breathing problems. Follow instructions from your health care provider about wearing your sleep device: ? Anytime you are sleeping, including during daytime naps. ? While taking prescription pain medicines, sleeping medicines, or medicines that make you drowsy.  If you smoke, do not smoke without supervision.  Keep all follow-up visits as told by your health care provider. This is important. Contact a health care provider if:  You keep feeling nauseous or you keep vomiting.  You feel light-headed.  You develop a rash.  You have a fever. Get help right away if:  You have trouble breathing. Summary  For several hours after your procedure, you may feel sleepy and have poor judgment.  Have a responsible adult stay with you for at least 24 hours or until you are awake and alert. This information is not intended to replace advice given to you by your health care provider. Make sure you discuss any questions you have with your health care provider. Document Released: 02/25/2016 Document Revised: 06/20/2017 Document Reviewed: 02/25/2016 Elsevier Interactive Patient Education  2019 [employer].

## 2019-05-10 NOTE — Anesthesia Preprocedure Evaluation (Signed)
Anesthesia Evaluation    Airway Mallampati: II       Dental  (+) Teeth Intact   Pulmonary    breath sounds clear to auscultation       Cardiovascular hypertension,  Rhythm:regular     Neuro/Psych  Headaches, PSYCHIATRIC DISORDERS Depression    GI/Hepatic   Endo/Other    Renal/GU      Musculoskeletal   Abdominal   Peds  Hematology  (+) Blood dyscrasia, anemia ,   Anesthesia Other Findings Morbid obesity  Reproductive/Obstetrics                             Anesthesia Physical Anesthesia Plan  ASA: III  Anesthesia Plan: MAC   Post-op Pain Management:    Induction:   PONV Risk Score and Plan:   Airway Management Planned:   Additional Equipment:   Intra-op Plan:   Post-operative Plan:   Informed Consent: I have reviewed the patients History and Physical, chart, labs and discussed the procedure including the risks, benefits and alternatives for the proposed anesthesia with the patient or authorized representative who has indicated his/her understanding and acceptance.       Plan Discussed with: Anesthesiologist  Anesthesia Plan Comments:         Anesthesia Quick Evaluation

## 2019-05-10 NOTE — Op Note (Signed)
[facility] Patient Name: Olivia Woodward Procedure Date: 05/10/2019 8:25 AM MRN: [id number] Date of Birth: 1966/05/03 Attending MD: Norvel Richards , MD CSN: [id number] Age: 53 Admit Type: Outpatient Procedure:                Colonoscopy Indications:              Screening for colorectal malignant neoplasm Providers:                Norvel Richards, MD, Otis Peak B. Sharon Seller, RN,                            Raphael Gibney, Technician Referring MD:              Medicines:                Propofol per Anesthesia Complications:            No immediate complications. Estimated Blood Loss:     Estimated blood loss was minimal. Procedure:                Pre-Anesthesia Assessment:                           - Prior to the procedure, a History and Physical                            was performed, and patient medications and                            allergies were reviewed. The patient's tolerance of                            previous anesthesia was also reviewed. The risks                            and benefits of the procedure and the sedation                            options and risks were discussed with the patient.                            All questions were answered, and informed consent                            was obtained. Prior Anticoagulants: The patient has                            taken no previous anticoagulant or antiplatelet                            agents. ASA Grade Assessment: II - A patient with                            mild systemic disease. After reviewing the risks  and benefits, the patient was deemed in                            satisfactory condition to undergo the procedure.                           After obtaining informed consent, the colonoscope                            was passed under direct vision. Throughout the                            procedure, the patient's blood pressure, pulse, and            oxygen saturations were monitored continuously. The                            CF-[id number] ([id number]) scope was introduced through                            the anus and advanced to the the cecum, identified                            by appendiceal orifice and ileocecal valve. The                            colonoscopy was performed without difficulty. The                            patient tolerated the procedure well. The quality                            of the bowel preparation was adequate. The                            ileocecal valve, appendiceal orifice, and rectum                            were photographed. Scope In: 8:59:26 AM Scope Out: 9:13:36 AM Scope Withdrawal Time: 0 hours 6 minutes 30 seconds  Total Procedure Duration: 0 hours 14 minutes 10 seconds  Findings:      The perianal and digital rectal examinations were normal.      Two sessile polyps were found in the sigmoid colon and ascending colon.       The polyps were 5 to 6 mm in size. These polyps were removed with a cold       snare. Resection and retrieval were complete. Estimated blood loss was       minimal.      The exam was otherwise without abnormality on direct and retroflexion       views. Impression:               - Two 5 to 6 mm polyps in the sigmoid colon and in  the ascending colon, removed with a cold snare.                            Resected and retrieved.                           - The examination was otherwise normal on direct                            and retroflexion views. Moderate Sedation:      Moderate (conscious) sedation was personally administered by an       anesthesia professional. The following parameters were monitored: oxygen       saturation, heart rate, blood pressure, respiratory rate, EKG, adequacy       of pulmonary ventilation, and response to care. Recommendation:           - Patient has a contact number available for                             emergencies. The signs and symptoms of potential                            delayed complications were discussed with the                            patient. Return to normal activities tomorrow.                            Written discharge instructions were provided to the                            patient.                           - Resume previous diet.                           - Continue present medications.                           - Repeat colonoscopy date to be determined after                            pending pathology results are reviewed for                            surveillance.                           - Return to GI office (date not yet determined). Procedure Code(s):        --- Professional ---                           [phone], Colonoscopy, flexible; with removal of  tumor(s), polyp(s), or other lesion(s) by snare                            technique Diagnosis Code(s):        --- Professional ---                           Z12.11, Encounter for screening for malignant                            neoplasm of colon                           K63.5, Polyp of colon CPT copyright 2019 American Medical Association. All rights reserved. The codes documented in this report are preliminary and upon coder review may  be revised to meet current compliance requirements. Cristopher Estimable. Rourk, MD Norvel Richards, MD 05/10/2019 9:21:13 AM This report has been signed electronically. Number of Addenda: 0

## 2019-05-10 NOTE — Transfer of Care (Signed)
Immediate Anesthesia Transfer of Care Note  Patient: Olivia Woodward  Procedure(s) Performed: COLONOSCOPY WITH PROPOFOL (N/A ) POLYPECTOMY  Patient Location: PACU  Anesthesia Type:MAC  Level of Consciousness: awake, alert  and oriented  Airway & Oxygen Therapy: Patient Spontanous Breathing and Patient connected to nasal cannula oxygen  Post-op Assessment: Report given to RN, Post -op Vital signs reviewed and stable and Patient moving all extremities X 4  Post vital signs: Reviewed and stable  Last Vitals:  Vitals Value Taken Time  BP    Temp    Pulse 64 05/10/19 0921  Resp 16 05/10/19 0921  SpO2 100 % 05/10/19 0921  Vitals shown include unvalidated device data.  Last Pain:  Vitals:   05/10/19 0851  TempSrc:   PainSc: 0-No pain         Complications: No apparent anesthesia complications

## 2019-05-10 NOTE — Anesthesia Postprocedure Evaluation (Signed)
Anesthesia Post Note  Patient: Olivia Woodward  Procedure(s) Performed: COLONOSCOPY WITH PROPOFOL (N/A ) POLYPECTOMY  Patient location during evaluation: Endoscopy Anesthesia Type: MAC Level of consciousness: awake and alert Pain management: pain level controlled Vital Signs Assessment: post-procedure vital signs reviewed and stable Respiratory status: spontaneous breathing, nonlabored ventilation, respiratory function stable and patient connected to nasal cannula oxygen Cardiovascular status: blood pressure returned to baseline and stable Postop Assessment: no apparent nausea or vomiting Anesthetic complications: no     Last Vitals:  Vitals:   05/10/19 0919 05/10/19 0930  BP: (!) 135/94 (!) 144/94  Pulse: 66 (!) 59  Resp: 16 14  Temp: 36.4 C   SpO2: 100% 100%    Last Pain:  Vitals:   05/10/19 0919  TempSrc:   PainSc: Kinnelon Marissa Weaver

## 2019-05-10 NOTE — Progress Notes (Signed)
No digital signature due to covid restrictions. Son Ely understanding

## 2019-05-10 NOTE — H&P (Signed)
@LOGO @   Primary Care Physician:  Fayrene Helper, MD Primary Gastroenterologist:  Dr. Daiva Nakayama  Pre-Procedure History & Physical: HPI:  Olivia Woodward is a 53 y.o. female is here for a screening colonoscopy.  Other than constipation, no GI symptoms.  Positive family history of colonic polyps in father.  But at advanced age.  Past Medical History:  Diagnosis Date  . Anemia   . Generalized headaches   . Hypertension     Past Surgical History:  Procedure Laterality Date  . ABDOMINAL HYSTERECTOMY     fibroids, partial  . BREAST REDUCTION SURGERY  2005  . GASTRIC BYPASS  2007    Prior to Admission medications   Medication Sig Start Date End Date Taking? Authorizing Provider  amLODipine (NORVASC) 5 MG tablet Take 1 tablet (5 mg total) by mouth daily. 10/26/18   Fayrene Helper, MD  buPROPion (WELLBUTRIN XL) 150 MG 24 hr tablet Take 1 tablet (150 mg total) by mouth daily. 05/14/19   Norman Clay, MD  CLENPIQ 10-3.5-12 MG-GM -GM/160ML SOLN  01/08/19   [provider]  ergocalciferol (VITAMIN D2) 1.25 MG (50000 UT) capsule Take 1 capsule (50,000 Units total) by mouth once a week. One capsule once weekly Patient not taking: Reported on 03/22/2019 10/26/18   Fayrene Helper, MD  ibuprofen (ADVIL) 800 MG tablet Take 800 mg by mouth every 8 (eight) hours as needed. 02/03/19   [provider]  nitrofurantoin (MACRODANTIN) 100 MG capsule Take 100 mg by mouth daily.    [provider]  Suvorexant (BELSOMRA) 10 MG TABS Take 10 mg by mouth at bedtime as needed (insomnia). 01/12/19   Norman Clay, MD  venlafaxine XR (EFFEXOR-XR) 150 MG 24 hr capsule Take total of 225 mg daily (150 mg+ 75 mg) 05/14/19   Norman Clay, MD  venlafaxine XR (EFFEXOR-XR) 75 MG 24 hr capsule Take total of 225 mg daily (150 mg+ 75 mg) 05/14/19   Norman Clay, MD    Allergies as of 01/07/2019 - Review Complete 01/07/2019  Allergen Reaction Noted  . Ketorolac tromethamine Hives 03/16/2009     Family History  Problem Relation Age of Onset  . Hypertension Mother   . Diabetes Mother   . Drug abuse Mother   . Hypertension Father   . Hypertension Sister   . Hypertension Sister   . Colon cancer Neg Hx     Social History   Socioeconomic History  . Marital status: Single    Spouse name: Not on file  . Number of children: Not on file  . Years of education: Not on file  . Highest education level: Not on file  Occupational History  . Not on file  Social Needs  . Financial resource strain: Not on file  . Food insecurity    Worry: Not on file    Inability: Not on file  . Transportation needs    Medical: Not on file    Non-medical: Not on file  Tobacco Use  . Smoking status: Never Smoker  . Smokeless tobacco: Never Used  Substance and Sexual Activity  . Alcohol use: No  . Drug use: No  . Sexual activity: Yes    Birth control/protection: Condom  Lifestyle  . Physical activity    Days per week: Not on file    Minutes per session: Not on file  . Stress: Not on file  Relationships  . Social Herbalist on phone: Not on file    Gets  together: Not on file    Attends religious service: Not on file    Active member of club or organization: Not on file    Attends meetings of clubs or organizations: Not on file    Relationship status: Not on file  . Intimate partner violence    Fear of current or ex partner: Not on file    Emotionally abused: Not on file    Physically abused: Not on file    Forced sexual activity: Not on file  Other Topics Concern  . Not on file  Social History Narrative  . Not on file    Review of Systems: See HPI, otherwise negative ROS  Physical Exam: There were no vitals taken for this visit. General:   Alert,  Well-developed, well-nourished, pleasant and cooperative in NAD Lungs:  Clear throughout to auscultation.   No wheezes, crackles, or rhonchi. No acute distress. Heart:  Regular rate and rhythm; no murmurs, clicks,  rubs,  or gallops. Abdomen:  Soft, nontender and nondistended. No masses, hepatosplenomegaly or hernias noted. Normal bowel sounds, without guarding, and without rebound.   Msk:  Symmetrical without gross deformities. Normal posture.  Impression/Plan: Olivia Woodward is now here to undergo a screening colonoscopy.  First ever average risk screening examination.  Risks, benefits, limitations, imponderables and alternatives regarding colonoscopy have been reviewed with the patient. Questions have been answered. All parties agreeable.     Notice:  This dictation was prepared with Dragon dictation along with smaller phrase technology. Any transcriptional errors that result from this process are unintentional and may not be corrected upon review.

## 2019-05-13 ENCOUNTER — Ambulatory Visit (HOSPITAL_COMMUNITY): Payer: 59 | Admitting: Psychiatry

## 2019-05-13 ENCOUNTER — Encounter: Payer: Self-pay | Admitting: Internal Medicine

## 2019-05-17 ENCOUNTER — Ambulatory Visit (INDEPENDENT_AMBULATORY_CARE_PROVIDER_SITE_OTHER): Payer: 59 | Admitting: Psychiatry

## 2019-05-17 ENCOUNTER — Other Ambulatory Visit: Payer: Self-pay

## 2019-05-17 DIAGNOSIS — F330 Major depressive disorder, recurrent, mild: Secondary | ICD-10-CM | POA: Diagnosis not present

## 2019-05-17 NOTE — Progress Notes (Signed)
Virtual Visit via Video Note  I connected with Olivia Woodward on 05/17/19 at  9:00 AM EDT by a video enabled telemedicine application and verified that I am speaking with the correct person using two identifiers.   I discussed the limitations of evaluation and management by telemedicine and the availability of in person appointments. The patient expressed understanding and agreed to proceed.      I provided 25 minutes of non-face-to-face time during this encounter.   Alonza Smoker, LCSW     THERAPIST PROGRESS NOTE    Session Time: Monday 05/17/2019 9:00 AM - 9:25 AM   Participation Level: Active  Behavioral Response: casual, less depressed, anxious     Type of Therapy: Individual Therapy  Treatment Goals addressed: Learn and implement behavioral and cognitive strategies to overcome depression and cope with anxiety  Interventions: CBT and Supportive  Summary: Olivia Woodward is a 53 y.o. female who is refered for services via [facility] due to experiencing symptoms of anxiety and depression. She denies any psychiatric hospitalizations. She participated in therapy with [name] for about 2 years.   She last attended 7 years ago. Patient states experiencing depression and anxiety along with grief. She reports financial issues and says her grandmother died in 2017-04-18. She reports having a lot of weight on her shoulders and being stressed with everyday life. She worries about her son who is in her second year of college. She fears he will go down the wrong path.  She reports isolative behaviors, social withdrawal, crying spells, insomnia, and anxiety along with feelings of hopelessness and worthlessness. She has had difficulty at work including poor concentration and difficulty dealing with [employer] customers. This has resulted in a written reprimand.  Patient's  last session was in April 2020. She reports continued stress but managing fairly well. She says legal issues with daughter's allegation  are pending. She reports attorneys are planning to arrest and charge alleged perpetrator (patient's nephew) in about a week. She reports her father requested patient drop the charges and now refuses to speak to her. She expresses hurt and confusion regarding this as father was supportive at the beginning of this process. She reports more support from mother but states she is limiting contact with her parents due to father's behavior. She continues to work and reports improved interaction with her daughter. Therapist and patient ended session early as patient had to go to work. Patient will schedule next appointment earlier so it will not interfere with her work schedule.   Suicidal/Homicidal: Nowithout intent/plan  Therapist Response: praised and reinforced patient's continued activity/improved routine/structure, discussed stressors, facilitated expression of thoughts and feelings, validated feelings, assisted patient identify coping statements  Plan: Return again in 2 weeks.  Diagnosis: Axis I: MDD, Recurrent, Severe    Axis II: Deferred    Alonza Smoker, LCSW 6/29/2020ork.

## 2019-05-20 ENCOUNTER — Encounter (HOSPITAL_COMMUNITY): Payer: Self-pay | Admitting: Internal Medicine

## 2019-05-24 ENCOUNTER — Ambulatory Visit (HOSPITAL_COMMUNITY): Payer: Managed Care, Other (non HMO)

## 2019-06-17 ENCOUNTER — Other Ambulatory Visit (HOSPITAL_COMMUNITY): Payer: Managed Care, Other (non HMO)

## 2019-06-22 ENCOUNTER — Encounter: Payer: Self-pay | Admitting: Family Medicine

## 2019-06-22 ENCOUNTER — Other Ambulatory Visit: Payer: Self-pay

## 2019-06-22 ENCOUNTER — Ambulatory Visit (INDEPENDENT_AMBULATORY_CARE_PROVIDER_SITE_OTHER): Payer: Managed Care, Other (non HMO) | Admitting: Family Medicine

## 2019-06-22 ENCOUNTER — Ambulatory Visit: Payer: Self-pay | Admitting: Family Medicine

## 2019-06-22 ENCOUNTER — Inpatient Hospital Stay (HOSPITAL_COMMUNITY): Payer: Managed Care, Other (non HMO) | Attending: Nurse Practitioner

## 2019-06-22 ENCOUNTER — Ambulatory Visit (HOSPITAL_COMMUNITY)
Admission: RE | Admit: 2019-06-22 | Discharge: 2019-06-22 | Disposition: A | Discharge status: Home / Self Care | Payer: Managed Care, Other (non HMO) | Source: Ambulatory Visit | Attending: Family Medicine | Admitting: Family Medicine

## 2019-06-22 VITALS — BP 120/86 | HR 76 | Temp 98.1°F | Resp 15 | Ht 66.0 in | Wt 245.0 lb

## 2019-06-22 DIAGNOSIS — D649 Anemia, unspecified: Secondary | ICD-10-CM

## 2019-06-22 DIAGNOSIS — D509 Iron deficiency anemia, unspecified: Secondary | ICD-10-CM | POA: Insufficient documentation

## 2019-06-22 DIAGNOSIS — I10 Essential (primary) hypertension: Secondary | ICD-10-CM

## 2019-06-22 DIAGNOSIS — M542 Cervicalgia: Secondary | ICD-10-CM

## 2019-06-22 DIAGNOSIS — E538 Deficiency of other specified B group vitamins: Secondary | ICD-10-CM | POA: Insufficient documentation

## 2019-06-22 LAB — CBC WITH DIFFERENTIAL/PLATELET
Abs Immature Granulocytes: 0.01 10*3/uL (ref 0.00–0.07)
Basophils Absolute: 0 10*3/uL (ref 0.0–0.1)
Basophils Relative: 0 %
Eosinophils Absolute: 0.2 10*3/uL (ref 0.0–0.5)
Eosinophils Relative: 3 %
HCT: 45.1 % (ref 36.0–46.0)
Hemoglobin: 13.9 g/dL (ref 12.0–15.0)
Immature Granulocytes: 0 %
Lymphocytes Relative: 36 %
Lymphs Abs: 1.9 10*3/uL (ref 0.7–4.0)
MCH: 31.4 pg (ref 26.0–34.0)
MCHC: 30.8 g/dL (ref 30.0–36.0)
MCV: 101.8 fL — ABNORMAL HIGH (ref 80.0–100.0)
Monocytes Absolute: 0.4 10*3/uL (ref 0.1–1.0)
Monocytes Relative: 7 %
Neutro Abs: 2.8 10*3/uL (ref 1.7–7.7)
Neutrophils Relative %: 54 %
Platelets: 229 10*3/uL (ref 150–400)
RBC: 4.43 MIL/uL (ref 3.87–5.11)
RDW: 12.2 % (ref 11.5–15.5)
WBC: 5.3 10*3/uL (ref 4.0–10.5)
nRBC: 0 % (ref 0.0–0.2)

## 2019-06-22 LAB — COMPREHENSIVE METABOLIC PANEL
ALT: 12 U/L (ref 0–44)
AST: 17 U/L (ref 15–41)
Albumin: 3.8 g/dL (ref 3.5–5.0)
Alkaline Phosphatase: 52 U/L (ref 38–126)
Anion gap: 8 (ref 5–15)
BUN: 7 mg/dL (ref 6–20)
CO2: 27 mmol/L (ref 22–32)
Calcium: 8.6 mg/dL — ABNORMAL LOW (ref 8.9–10.3)
Chloride: 107 mmol/L (ref 98–111)
Creatinine, Ser: 0.72 mg/dL (ref 0.44–1.00)
GFR calc Af Amer: >60 mL/min (ref >60)
GFR calc non Af Amer: >60 mL/min (ref >60)
Glucose, Bld: 75 mg/dL (ref 70–99)
Potassium: 3.4 mmol/L — ABNORMAL LOW (ref 3.5–5.1)
Sodium: 142 mmol/L (ref 135–145)
Total Bilirubin: 0.9 mg/dL (ref 0.3–1.2)
Total Protein: 7.2 g/dL (ref 6.5–8.1)

## 2019-06-22 LAB — IRON AND TIBC
Iron: 70 ug/dL (ref 28–170)
Saturation Ratios: 19 % (ref 10.4–31.8)
TIBC: 372 ug/dL (ref 250–450)
UIBC: 302 ug/dL

## 2019-06-22 LAB — FOLATE: Folate: 6.5 ng/mL (ref >5.9)

## 2019-06-22 LAB — FERRITIN: Ferritin: 7 ng/mL — ABNORMAL LOW (ref 11–307)

## 2019-06-22 LAB — LACTATE DEHYDROGENASE: LDH: 133 U/L (ref 98–192)

## 2019-06-22 LAB — VITAMIN B12: Vitamin B-12: 67 pg/mL — ABNORMAL LOW (ref 180–914)

## 2019-06-22 MED ORDER — PREDNISONE 20 MG PO TABS: ORAL_TABLET | ORAL | 0 refills | Status: DC

## 2019-06-22 MED ORDER — PANTOPRAZOLE SODIUM 40 MG PO TBEC: 40.0000 mg | DELAYED_RELEASE_TABLET | Freq: Every day | ORAL | 0 refills | Status: DC

## 2019-06-22 MED ORDER — GABAPENTIN 100 MG PO CAPS: ORAL_CAPSULE | ORAL | 1 refills | Status: DC

## 2019-06-22 MED ORDER — METHYLPREDNISOLONE ACETATE 80 MG/ML IJ SUSP
120.0000 mg | Freq: Once | INTRAMUSCULAR | Status: AC
  Administered 2019-06-22: 120 mg via INTRAMUSCULAR

## 2019-06-22 NOTE — Progress Notes (Signed)
   Olivia Woodward     MRN: [id number]      DOB: 03/18/1966   HPI Olivia Woodward is here ewith a 3 day h/o unprovoked severe bilateral shoulder and neck pain, weak grip, no aggravating factor, no previous episodes. Denies lower extremity symptoms, denies incontinece. Pain is fron 8 to 10. Disturbs sleep and limits neck and upper extremity movement ROS Denies recent fever or chills. Denies sinus pressure, nasal congestion, ear pain or sore throat. Denies chest congestion, productive cough or wheezing. Denies chest pains, palpitations and leg swelling Denies abdominal pain, nausea, vomiting,diarrhea or constipation.   Denies dysuria, frequency, hesitancy or incontinence.  Denies uncontrolled depression, anxiety has  Insomnia.due to painDenies skin break down or rash.   PE BP 120/86   Pulse 76   Temp 98.1 F (36.7 C) (Temporal)   Resp 15   Ht 5\' 6"  (1.676 m)   Wt 245 lb (111.1 kg)   SpO2 98%   BMI 39.54 kg/m    Patient alert and oriented and in no cardiopulmonary distress.  HEENT: No facial asymmetry, EOMI,   oropharynx pink and moist.  Neck decreased ROM no JVD, no mass.  Chest: Clear to auscultation bilaterally.  CVS: S1, S2 no murmurs, no S3.Regular rate.  ABD: Soft non tender.   Ext: No edema  MS: DEcreased ROM shoulders,adequate in  hips and knees.  Skin: Intact, no ulcerations or rash noted.  Psych: Good eye contact, normal affect. Memory intact not anxious or depressed appearing.  CNS: CN 2-12 intact,grade 4 grip in hands.   Assessment & Plan  Neck pain, bilateral 3 day history, acute and disabling DepoMedrol 120 mg IM followed by prednisone taper, gabapentin and protonix X ray C spine  Essential hypertension Controlled, no change in medication DASH diet and commitment to daily physical activity for a minimum of 30 minutes discussed and encouraged, as a part of hypertension management. The importance of attaining a healthy weight is also discussed.   BP/Weight 06/22/2019 05/10/2019 03/22/2019 03/17/2019 02/03/2019 02/03/2019 4/92/0100  Systolic BP 712 197 588 325 498 264 158  Diastolic BP 86 83 92 84 84 84 91  Wt. (Lbs) 245 235 243.4 242 242.8 - 240.6  BMI 39.54 37.93 39.29 39.06 39.19 - 38.83  Some encounter information is confidential and restricted. Go to Review Flowsheets activity to see all data.       Morbid obesity ([place])  Patient re-educated about  the importance of commitment to a  minimum of 150 minutes of exercise per week as able.  The importance of healthy food choices with portion control discussed, as well as eating regularly and within a 12 hour window most days. The need to choose "clean , green" food 50 to 75% of the time is discussed, as well as to make water the primary drink and set a goal of 64 ounces water daily.    Weight /BMI 06/22/2019 05/10/2019 03/22/2019  WEIGHT 245 lb 235 lb 243 lb 6.4 oz  HEIGHT 5\' 6"  5\' 6"  -  BMI 39.54 kg/m2 37.93 kg/m2 39.29 kg/m2  Some encounter information is confidential and restricted. Go to Review Flowsheets activity to see all data.

## 2019-06-22 NOTE — Assessment & Plan Note (Signed)
3 day history, acute and disabling DepoMedrol 120 mg IM followed by prednisone taper, gabapentin and protonix X ray C spine

## 2019-06-22 NOTE — Assessment & Plan Note (Signed)
  Patient re-educated about  the importance of commitment to a  minimum of 150 minutes of exercise per week as able.  The importance of healthy food choices with portion control discussed, as well as eating regularly and within a 12 hour window most days. The need to choose "clean , green" food 50 to 75% of the time is discussed, as well as to make water the primary drink and set a goal of 64 ounces water daily.    Weight /BMI 06/22/2019 05/10/2019 03/22/2019  WEIGHT 245 lb 235 lb 243 lb 6.4 oz  HEIGHT 5\' 6"  5\' 6"  -  BMI 39.54 kg/m2 37.93 kg/m2 39.29 kg/m2  Some encounter information is confidential and restricted. Go to Review Flowsheets activity to see all data.

## 2019-06-22 NOTE — Addendum Note (Signed)
Addended by: Eual Fines on: 06/22/2019 01:06 PM   Modules accepted: Orders

## 2019-06-22 NOTE — Patient Instructions (Signed)
F/U as before call if you need me sooner.  You are treated  today for severe neck pain radiating to both shoulders.  Please get x-ray of your neck today.  Depo-Medrol 120 mg is injected in the office today and prednisone in tapering doses has been sent to your pharmacy.  Start prednisone today as well as Protonix and gabapentin for your severe acute pain.  I believe that your shoulder pain is originating from problems in the of the spine upper spine in your neck and I hope that the medication will be effective in giving you resolution of your symptoms .  Thanks for choosing [facility], we consider it a privelige to serve you.   Social distancing. Frequent hand washing with soap and water Keeping your hands off of your face. These 3 practices will help to keep both you and your community healthy during this time. Please practice them faithfully!

## 2019-06-22 NOTE — Assessment & Plan Note (Signed)
Controlled, no change in medication DASH diet and commitment to daily physical activity for a minimum of 30 minutes discussed and encouraged, as a part of hypertension management. The importance of attaining a healthy weight is also discussed.  BP/Weight 06/22/2019 05/10/2019 03/22/2019 03/17/2019 02/03/2019 02/03/2019 3/88/7195  Systolic BP 974 718 550 158 682 574 935  Diastolic BP 86 83 92 84 84 84 91  Wt. (Lbs) 245 235 243.4 242 242.8 - 240.6  BMI 39.54 37.93 39.29 39.06 39.19 - 38.83  Some encounter information is confidential and restricted. Go to Review Flowsheets activity to see all data.

## 2019-06-23 LAB — VITAMIN D 25 HYDROXY (VIT D DEFICIENCY, FRACTURES): Vit D, 25-Hydroxy: 5.9 ng/mL — ABNORMAL LOW (ref 30.0–100.0)

## 2019-06-24 ENCOUNTER — Encounter: Payer: Self-pay | Admitting: Orthopaedic Surgery

## 2019-06-24 ENCOUNTER — Other Ambulatory Visit: Payer: Self-pay | Admitting: Family Medicine

## 2019-06-24 ENCOUNTER — Other Ambulatory Visit: Payer: Self-pay

## 2019-06-24 ENCOUNTER — Ambulatory Visit (INDEPENDENT_AMBULATORY_CARE_PROVIDER_SITE_OTHER): Payer: Managed Care, Other (non HMO) | Admitting: Orthopaedic Surgery

## 2019-06-24 ENCOUNTER — Ambulatory Visit: Payer: Managed Care, Other (non HMO)

## 2019-06-24 ENCOUNTER — Inpatient Hospital Stay (HOSPITAL_BASED_OUTPATIENT_CLINIC_OR_DEPARTMENT_OTHER): Payer: Managed Care, Other (non HMO) | Admitting: Nurse Practitioner

## 2019-06-24 ENCOUNTER — Inpatient Hospital Stay (HOSPITAL_COMMUNITY): Payer: Managed Care, Other (non HMO)

## 2019-06-24 ENCOUNTER — Encounter (HOSPITAL_COMMUNITY): Payer: Self-pay | Admitting: Nurse Practitioner

## 2019-06-24 VITALS — Temp 98.4°F | Ht 66.0 in | Wt 248.0 lb

## 2019-06-24 DIAGNOSIS — M25512 Pain in left shoulder: Secondary | ICD-10-CM

## 2019-06-24 DIAGNOSIS — M25511 Pain in right shoulder: Secondary | ICD-10-CM

## 2019-06-24 DIAGNOSIS — Z6841 Body Mass Index (BMI) 40.0 and over, adult: Secondary | ICD-10-CM | POA: Diagnosis not present

## 2019-06-24 DIAGNOSIS — D509 Iron deficiency anemia, unspecified: Secondary | ICD-10-CM | POA: Diagnosis present

## 2019-06-24 DIAGNOSIS — D508 Other iron deficiency anemias: Secondary | ICD-10-CM | POA: Diagnosis not present

## 2019-06-24 DIAGNOSIS — E559 Vitamin D deficiency, unspecified: Secondary | ICD-10-CM | POA: Diagnosis not present

## 2019-06-24 DIAGNOSIS — D649 Anemia, unspecified: Secondary | ICD-10-CM

## 2019-06-24 DIAGNOSIS — E538 Deficiency of other specified B group vitamins: Secondary | ICD-10-CM | POA: Diagnosis present

## 2019-06-24 MED ORDER — CYANOCOBALAMIN 1000 MCG/ML IJ SOLN
1000.0000 ug | Freq: Once | INTRAMUSCULAR | Status: AC
  Administered 2019-06-24: 14:00:00 1000 ug via INTRAMUSCULAR
  Filled 2019-06-24: qty 1

## 2019-06-24 MED ORDER — ERGOCALCIFEROL 1.25 MG (50000 UT) PO CAPS: 50000.0000 [IU] | ORAL_CAPSULE | ORAL | 3 refills | Status: DC

## 2019-06-24 MED ORDER — CYCLOBENZAPRINE HCL 10 MG PO TABS: 10.0000 mg | ORAL_TABLET | Freq: Three times a day (TID) | ORAL | 0 refills | Status: DC | PRN

## 2019-06-24 NOTE — Patient Instructions (Signed)
[place] at [facility] Discharge Instructions  Follow up in 3 months with labs  Start taking oral vit B12 daily 2500 mcq  Start taking prescription vitamin D once a week   Thank you for choosing [place] at [facility] to provide your oncology and hematology care.  To afford each patient quality time with our provider, please arrive at least 15 minutes before your scheduled appointment time.   If you have a lab appointment with the [facility] please come in thru the  Main Entrance and check in at the main information desk  You need to re-schedule your appointment should you arrive 10 or more minutes late.  We strive to give you quality time with our providers, and arriving late affects you and other patients whose appointments are after yours.  Also, if you no show three or more times for appointments you may be dismissed from the clinic at the providers discretion.     Again, thank you for choosing [facility].  Our hope is that these requests will decrease the amount of time that you wait before being seen by our physicians.       _____________________________________________________________  Should you have questions after your visit to [facility], please contact our office at [phone] between the hours of 8:00 a.m. and 4:30 p.m.  Voicemails left after 4:00 p.m. will not be returned until the following business day.  For prescription refill requests, have your pharmacy contact our office and allow 72 hours.    Cancer Center Support Programs:   > Cancer Support Group  2nd Tuesday of the month 1pm-2pm, Journey Room

## 2019-06-24 NOTE — Progress Notes (Signed)
[place], [place] [address]   CLINIC:  Medical Oncology/Hematology  PCP:  Olivia Helper, MD [address] [place]  [phone]   REASON FOR VISIT: Follow-up for iron deficiency anemia  CURRENT THERAPY: Intermittent iron infusions   INTERVAL HISTORY:  Olivia Woodward 53 y.o. female returns for routine follow-up for iron deficiency anemia.  She reports she has increased fatigue and tiredness throughout the day.  She denies any bright red bleeding per rectum or melena.  She denies any easy bruising. Denies any nausea, vomiting, or diarrhea. Denies any new pains. Had not noticed any recent bleeding such as epistaxis, hematuria or hematochezia. Denies recent chest pain on exertion, shortness of breath on minimal exertion, pre-syncopal episodes, or palpitations. Denies any numbness or tingling in hands or feet. Denies any recent fevers, infections, or recent hospitalizations. Patient reports appetite at 100 % and energy level at 50 %.  She is eating well maintaining her weight at this time.    REVIEW OF SYSTEMS:  Review of Systems  Constitutional: Positive for fatigue.  All other systems reviewed and are negative.    PAST MEDICAL/SURGICAL HISTORY:  Past Medical History:  Diagnosis Date  . Anemia   . Generalized headaches   . Hypertension    Past Surgical History:  Procedure Laterality Date  . ABDOMINAL HYSTERECTOMY     fibroids, partial  . BREAST REDUCTION SURGERY  2005  . COLONOSCOPY WITH PROPOFOL N/A 05/10/2019   Procedure: COLONOSCOPY WITH PROPOFOL;  Surgeon: Daneil Dolin, MD;  [facility];  Service: Endoscopy;  Laterality: N/A;  2:45pm  . GASTRIC BYPASS  2007  . POLYPECTOMY  05/10/2019   Procedure: POLYPECTOMY;  Surgeon: Daneil Dolin, MD;  Location: [facility];  Service: Endoscopy;;  colon     SOCIAL HISTORY:  Social History   Socioeconomic History  . Marital status: Single   Spouse name: Not on file  . Number of children: Not on file  . Years of education: Not on file  . Highest education level: Not on file  Occupational History  . Not on file  Social Needs  . Financial resource strain: Not on file  . Food insecurity    Worry: Not on file    Inability: Not on file  . Transportation needs    Medical: Not on file    Non-medical: Not on file  Tobacco Use  . Smoking status: Never Smoker  . Smokeless tobacco: Never Used  Substance and Sexual Activity  . Alcohol use: No  . Drug use: No  . Sexual activity: Yes    Birth control/protection: Condom  Lifestyle  . Physical activity    Days per week: Not on file    Minutes per session: Not on file  . Stress: Not on file  Relationships  . Social Herbalist on phone: Not on file    Gets together: Not on file    Attends religious service: Not on file    Active member of club or organization: Not on file    Attends meetings of clubs or organizations: Not on file    Relationship status: Not on file  . Intimate partner violence    Fear of current or ex partner: Not on file    Emotionally abused: Not on file    Physically abused: Not on file    Forced sexual activity: Not on file  Other Topics Concern  .  Not on file  Social History Narrative  . Not on file    FAMILY HISTORY:  Family History  Problem Relation Age of Onset  . Hypertension Mother   . Diabetes Mother   . Drug abuse Mother   . Hypertension Father   . Hypertension Sister   . Hypertension Sister   . Colon cancer Neg Hx     CURRENT MEDICATIONS:  Outpatient Encounter Medications as of 06/24/2019  Medication Sig Note  . amLODipine (NORVASC) 5 MG tablet Take 1 tablet (5 mg total) by mouth daily.   . cyclobenzaprine (FLEXERIL) 10 MG tablet Take 1 tablet (10 mg total) by mouth 3 (three) times daily as needed for muscle spasms. 06/24/2019: Has not started yet   . ergocalciferol (VITAMIN D2) 1.25 MG (50000 UT) capsule Take 1 capsule  (50,000 Units total) by mouth once a week. One capsule once weekly   . gabapentin (NEURONTIN) 100 MG capsule Take one to two capsules at bedtime for pain   . pantoprazole (PROTONIX) 40 MG tablet Take 1 tablet (40 mg total) by mouth daily.   . predniSONE (DELTASONE) 20 MG tablet Take one tablet three times daily for 3 days, then take one tablet two times daily for 3 days , then take one tablet once daily for 3 days , then stop   . [DISCONTINUED] ergocalciferol (VITAMIN D2) 1.25 MG (50000 UT) capsule Take 1 capsule (50,000 Units total) by mouth once a week. One capsule once weekly   . [EXPIRED] cyanocobalamin ((VITAMIN B-12)) injection 1,000 mcg     No facility-administered encounter medications on file as of 06/24/2019.     ALLERGIES:  Allergies  Allergen Reactions  . Ketorolac Tromethamine Hives    Lips swelled     PHYSICAL EXAM:  ECOG Performance status: 1  Vitals:   06/24/19 1300  BP: 131/85  Pulse: 86  Resp: 16  Temp: 98.2 F (36.8 C)  SpO2: 100%   Filed Weights   06/24/19 1300  Weight: 246 lb (111.6 kg)    Physical Exam Constitutional:      Appearance: Normal appearance. She is normal weight.  Cardiovascular:     Rate and Rhythm: Normal rate and regular rhythm.     Heart sounds: Normal heart sounds.  Pulmonary:     Effort: Pulmonary effort is normal.     Breath sounds: Normal breath sounds.  Abdominal:     General: Bowel sounds are normal.     Palpations: Abdomen is soft.  Musculoskeletal: Normal range of motion.  Skin:    General: Skin is warm and dry.  Neurological:     Mental Status: She is alert and oriented to person, place, and time. Mental status is at baseline.  Psychiatric:        Mood and Affect: Mood normal.        Behavior: Behavior normal.        Thought Content: Thought content normal.        Judgment: Judgment normal.      LABORATORY DATA:  I have reviewed the labs as listed.  CBC    Component Value Date/Time   WBC 5.3 06/22/2019 0938    RBC 4.43 06/22/2019 0938   HGB 13.9 06/22/2019 0938   HCT 45.1 06/22/2019 0938   PLT 229 06/22/2019 0938   MCV 101.8 (H) 06/22/2019 0938   MCH 31.4 06/22/2019 0938   MCHC 30.8 06/22/2019 0938   RDW 12.2 06/22/2019 0938   LYMPHSABS 1.9 06/22/2019 0938   MONOABS  0.4 06/22/2019 0938   EOSABS 0.2 06/22/2019 0938   BASOSABS 0.0 06/22/2019 0938   CMP Latest Ref Rng & Units 06/22/2019 03/22/2019 11/19/2018  Glucose 70 - 99 mg/dL 75 99 84  BUN 6 - 20 mg/dL 7 8 11   Creatinine 0.44 - 1.00 mg/dL 0.72 0.75 0.64  Sodium 135 - 145 mmol/L 142 141 138  Potassium 3.5 - 5.1 mmol/L 3.4(L) 3.6 3.4(L)  Chloride 98 - 111 mmol/L 107 108 108  CO2 22 - 32 mmol/L 27 25 25   Calcium 8.9 - 10.3 mg/dL 8.6(L) 8.8(L) 8.5(L)  Total Protein 6.5 - 8.1 g/dL 7.2 7.1 7.5  Total Bilirubin 0.3 - 1.2 mg/dL 0.9 0.8 0.5  Alkaline Phos 38 - 126 U/L 52 49 51  AST 15 - 41 U/L 17 17 24   ALT 0 - 44 U/L 12 13 16      I personally performed a face-to-face visit.  All questions were answered to patient's stated satisfaction. Encouraged patient to call with any new concerns or questions before his next visit to the cancer center and we can certain see him sooner, if needed.     ASSESSMENT & PLAN:   Iron deficiency anemia 1.  Normocytic anemia: - Her last Feraheme infusions was on 11/25/2018 and 12/02/2018. -She tried oral iron supplements but was unable to tolerate them due to constipation. - She denies any bright red blood per rectum. 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 - She reported she had burgundy color stools occasionally.  She reports she is constipated and has hemorrhoids.  However we checked her stool for occult blood it was negative on 3 different occasions. - She was due for a colonoscopy in February 2020.  It was canceled due to the COVID crisis.  They are going to reschedule in a few months. -Labs on 06/22/2019 showed her hemoglobin 13.9, ferritin 7, percent saturation 19, platelets 229, WBC 5.3 -She reports she has  increased fatigue and tiredness throughout the day. -We will set her up with 2 infusions of IV iron at this time -We will see her back in 3 months with repeat labs.  2.  B12 deficiency: - She was placed on oral B12 supplements due to her vitamin B12 level 59.  Then switch to monthly B12 injections. -Labs on 06/22/2019 showed her vitamin B12 at 65 - She was started on B12 injections she will continue this at this time. -We also placed her on oral B12 tablets 25 mcg daily. -We will see her back in 3 months with repeat labs  3.  Vitamin D deficiency: - Labs on 06/22/2019 showed her vitamin D level at 5.9. - She reports she was placed on vitamin D weekly however she does not take it on a regular basis. -I called in a new order for her vitamin D weekly and reeducated her on the importance of taking it weekly. -We will see her back in 3 months with repeat labs.      Orders placed this encounter:  Orders Placed This Encounter  Procedures  . Lactate dehydrogenase  . CBC with Differential/Platelet  . Comprehensive metabolic panel  . Ferritin  . Iron and TIBC  . Vitamin B12  . VITAMIN D 25 Hydroxy (Vit-D Deficiency, Fractures)  . Folate     Olivia Finders, FNP-C [place] [phone]

## 2019-06-24 NOTE — Assessment & Plan Note (Addendum)
1.  Normocytic anemia: - Her last Feraheme infusions was on 11/25/2018 and 12/02/2018. -She tried oral iron supplements but was unable to tolerate them due to constipation. - She denies any bright red blood per rectum. 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 - She reported she had burgundy color stools occasionally.  She reports she is constipated and has hemorrhoids.  However we checked her stool for occult blood it was negative on 3 different occasions. - She was due for a colonoscopy in February 2020.  It was canceled due to the COVID crisis.  They are going to reschedule in a few months. -Labs on 06/22/2019 showed her hemoglobin 13.9, ferritin 7, percent saturation 19, platelets 229, WBC 5.3 -She reports she has increased fatigue and tiredness throughout the day. -We will set her up with 2 infusions of IV iron at this time -We will see her back in 3 months with repeat labs.  2.  B12 deficiency: - She was placed on oral B12 supplements due to her vitamin B12 level 59.  Then switch to monthly B12 injections. -Labs on 06/22/2019 showed her vitamin B12 at 4 - She was started on B12 injections she will continue this at this time. -We also placed her on oral B12 tablets 25 mcg daily. -We will see her back in 3 months with repeat labs  3.  Vitamin D deficiency: - Labs on 06/22/2019 showed her vitamin D level at 5.9. - She reports she was placed on vitamin D weekly however she does not take it on a regular basis. -I called in a new order for her vitamin D weekly and reeducated her on the importance of taking it weekly. -We will see her back in 3 months with repeat labs.

## 2019-06-24 NOTE — Progress Notes (Signed)
Olivia Woodward presents today for B12 injection. VSS. Injection tolerated without incident or complaint. See MAR for details. Patient discharged in satisfactory condition with follow up instructions.

## 2019-06-24 NOTE — Progress Notes (Signed)
Subjective:    Patient ID: Olivia Woodward, female    DOB: Oct 26, 1966, 53 y.o.   MRN: [id number]  HPI She developed pain in both shoulders on Sunday, August 2.  She got worse and had pain in the neck area also. She saw Dr. Moshe Cipro on June 22, 2019.  She was given Neurontin, prednisone dose pack and pain medicine.  She is not improved.  She had x-rays of the neck done on 06-22-2019 which were negative and showed slight DJD at C5-C6.  She has no numbness, no trauma, no weakness.  She is not getting better.  Dr. Moshe Cipro called and asked the patient be seen urgently today.  It was done.   Review of Systems  Constitutional: Positive for activity change.  Musculoskeletal: Positive for arthralgias.  Psychiatric/Behavioral: The patient is nervous/anxious.   All other systems reviewed and are negative.  For Review of Systems, all other systems reviewed and are negative.  The following is a summary of the past history medically, past history surgically, known current medicines, social history and family history.  This information is gathered electronically by the computer from prior information and documentation.  I review this each visit and have found including this information at this point in the chart is beneficial and informative.   Past Medical History:  Diagnosis Date  . Anemia   . Generalized headaches   . Hypertension     Past Surgical History:  Procedure Laterality Date  . ABDOMINAL HYSTERECTOMY     fibroids, partial  . BREAST REDUCTION SURGERY  2005  . COLONOSCOPY WITH PROPOFOL N/A 05/10/2019   Procedure: COLONOSCOPY WITH PROPOFOL;  Surgeon: Daneil Dolin, MD;  [facility];  Service: Endoscopy;  Laterality: N/A;  2:45pm  . GASTRIC BYPASS  2007  . POLYPECTOMY  05/10/2019   Procedure: POLYPECTOMY;  Surgeon: Daneil Dolin, MD;  [facility];  Service: Endoscopy;;  colon    Current Outpatient Medications on File Prior to Visit  Medication Sig Dispense  Refill  . amLODipine (NORVASC) 5 MG tablet Take 1 tablet (5 mg total) by mouth daily. 90 tablet 3  . ergocalciferol (VITAMIN D2) 1.25 MG (50000 UT) capsule Take 1 capsule (50,000 Units total) by mouth once a week. One capsule once weekly 12 capsule 3  . gabapentin (NEURONTIN) 100 MG capsule Take one to two capsules at bedtime for pain 30 capsule 1  . pantoprazole (PROTONIX) 40 MG tablet Take 1 tablet (40 mg total) by mouth daily. 145 tablet 0  . predniSONE (DELTASONE) 20 MG tablet Take one tablet three times daily for 3 days, then take one tablet two times daily for 3 days , then take one tablet once daily for 3 days , then stop 18 tablet 0  . cyclobenzaprine (FLEXERIL) 10 MG tablet Take 1 tablet (10 mg total) by mouth 3 (three) times daily as needed for muscle spasms. (Patient not taking: Reported on 06/24/2019) 30 tablet 0   No current facility-administered medications on file prior to visit.     Social History   Socioeconomic History  . Marital status: Single    Spouse name: Not on file  . Number of children: Not on file  . Years of education: Not on file  . Highest education level: Not on file  Occupational History  . Not on file  Social Needs  . Financial resource strain: Not on file  . Food insecurity    Worry: Not on file  Inability: Not on file  . Transportation needs    Medical: Not on file    Non-medical: Not on file  Tobacco Use  . Smoking status: Never Smoker  . Smokeless tobacco: Never Used  Substance and Sexual Activity  . Alcohol use: No  . Drug use: No  . Sexual activity: Yes    Birth control/protection: Condom  Lifestyle  . Physical activity    Days per week: Not on file    Minutes per session: Not on file  . Stress: Not on file  Relationships  . Social Herbalist on phone: Not on file    Gets together: Not on file    Attends religious service: Not on file    Active member of club or organization: Not on file    Attends meetings of clubs or  organizations: Not on file    Relationship status: Not on file  . Intimate partner violence    Fear of current or ex partner: Not on file    Emotionally abused: Not on file    Physically abused: Not on file    Forced sexual activity: Not on file  Other Topics Concern  . Not on file  Social History Narrative  . Not on file    Family History  Problem Relation Age of Onset  . Hypertension Mother   . Diabetes Mother   . Drug abuse Mother   . Hypertension Father   . Hypertension Sister   . Hypertension Sister   . Colon cancer Neg Hx     Temp 98.4 F (36.9 C)   Ht 5\' 6"  (1.676 m)   Wt 248 lb (112.5 kg)   BMI 40.03 kg/m   Body mass index is 40.03 kg/m.  The patient meets the AMA guidelines for Morbid (severe) obesity with a BMI > 40.0 and I have recommended weight loss.      Objective:   Physical Exam Vitals signs reviewed.  Constitutional:      Appearance: She is well-developed.  HENT:     Head: Normocephalic and atraumatic.  Eyes:     Conjunctiva/sclera: Conjunctivae normal.     Pupils: Pupils are equal, round, and reactive to light.  Neck:     Musculoskeletal: Normal range of motion and neck supple.  Cardiovascular:     Rate and Rhythm: Normal rate and regular rhythm.  Pulmonary:     Effort: Pulmonary effort is normal.  Abdominal:     Palpations: Abdomen is soft.  Musculoskeletal:     Right shoulder: She exhibits decreased range of motion and tenderness.     Left shoulder: She exhibits decreased range of motion and tenderness.       Arms:  Skin:    General: Skin is warm and dry.  Neurological:     Mental Status: She is alert and oriented to person, place, and time.     Cranial Nerves: No cranial nerve deficit.     Motor: No abnormal muscle tone.     Coordination: Coordination normal.     Deep Tendon Reflexes: Reflexes are normal and symmetric. Reflexes normal.  Psychiatric:        Behavior: Behavior normal.        Thought Content: Thought content  normal.        Judgment: Judgment normal.      X-rays were done of both shoulders, reported separately.  Negative.     Assessment & Plan:   Encounter Diagnoses  Name Primary?  Marland Kitchen  Acute pain of both shoulders Yes  . Body mass index 40.0-44.9, adult ([place])   . Morbid obesity ([place])    PROCEDURE NOTE:  The patient request injection, verbal consent was obtained.  The left shoulder was prepped appropriately after time out was performed.   Sterile technique was observed and injection of 1 cc of Depo-Medrol 40 mg with several cc's of plain xylocaine. Anesthesia was provided by ethyl chloride and a 20-gauge needle was used to inject the shoulder area. A posterior approach was used.  The injection was tolerated well.  A band aid dressing was applied.  The patient was advised to apply ice later today and tomorrow to the injection sight as needed.  PROCEDURE NOTE:  The patient request injection, verbal consent was obtained.  The right shoulder was prepped appropriately after time out was performed.   Sterile technique was observed and injection of 1 cc of Depo-Medrol 40 mg with several cc's of plain xylocaine. Anesthesia was provided by ethyl chloride and a 20-gauge needle was used to inject the shoulder area. A posterior approach was used.  The injection was tolerated well.  A band aid dressing was applied.  The patient was advised to apply ice later today and tomorrow to the injection sight as needed.  Continue medicines given by Dr. Moshe Cipro.  Begin PT.  She may need MRI.  I can see her in the [place] office in two weeks, July 07, 2019.  Call if any problem.  Precautions discussed.   Electronically Signed Sanjuana Kava, MD 8/6/202012:11 PM

## 2019-06-24 NOTE — Addendum Note (Signed)
Addended by: Derek Mound A on: 06/24/2019 12:20 PM   Modules accepted: Orders

## 2019-06-25 ENCOUNTER — Encounter: Payer: Self-pay | Admitting: Family Medicine

## 2019-06-29 ENCOUNTER — Ambulatory Visit (HOSPITAL_COMMUNITY): Payer: Managed Care, Other (non HMO)

## 2019-06-30 NOTE — Progress Notes (Signed)
Virtual Visit via Video Note  I connected with Olivia Woodward on 07/05/19 at  8:40 AM EDT by a video enabled telemedicine application and verified that I am speaking with the correct person using two identifiers.   I discussed the limitations of evaluation and management by telemedicine and the availability of in person appointments. The patient expressed understanding and agreed to proceed.   I discussed the assessment and treatment plan with the patient. The patient was provided an opportunity to ask questions and all were answered. The patient agreed with the plan and demonstrated an understanding of the instructions.   The patient was advised to call back or seek an in-person evaluation if the symptoms worsen or if the condition fails to improve as anticipated.  I provided 15 minutes of non-face-to-face time during this encounter.   Norman Clay, MD    [facility] MD/PA/NP OP Progress Note  07/05/2019 9:03 AM Olivia Woodward  MRN:  [id number]  Chief Complaint:  Chief Complaint    Depression; Follow-up     HPI:  This is a follow-up appointment for depression.  He states that although her husband turned himself in, her father paid bond and he is released. The court will not be held until Feb 2021 due to pandemic.  She feels hurt by this situation especially because her parents does not believe in her daughter.  She states that her daughter is doing fine; her daughter is seeing a therapist.  The patient wishes it to be over and feels overwhelmed by things.  She has insomnia.  She feels depressed.  She feels fatigue.  She has difficulty in concentration.  She denies SI.  She feels anxious and tense.  She has a panic attack once a week.  She has been able to go to work and wants to continue doing it as it is an "outlet" for her. While reviewing her medication, it turns out that she has not bene taking bupropion for a month as it is difficult for her to keep track of her daily medication.     Visit Diagnosis:    ICD-10-CM   1. MDD (major depressive disorder), recurrent episode, mild ([place])  F33.0     Past Psychiatric History: Please see initial evaluation for full details. I have reviewed the history. No updates at this time.     Past Medical History:  Past Medical History:  Diagnosis Date  . Anemia   . Generalized headaches   . Hypertension     Past Surgical History:  Procedure Laterality Date  . ABDOMINAL HYSTERECTOMY     fibroids, partial  . BREAST REDUCTION SURGERY  2005  . COLONOSCOPY WITH PROPOFOL N/A 05/10/2019   Procedure: COLONOSCOPY WITH PROPOFOL;  Surgeon: Daneil Dolin, MD;  [facility];  Service: Endoscopy;  Laterality: N/A;  2:45pm  . GASTRIC BYPASS  2007  . POLYPECTOMY  05/10/2019   Procedure: POLYPECTOMY;  Surgeon: Daneil Dolin, MD;  Location: [facility];  Service: Endoscopy;;  colon    Family Psychiatric History: Please see initial evaluation for full details. I have reviewed the history. No updates at this time.     Family History:  Family History  Problem Relation Age of Onset  . Hypertension Mother   . Diabetes Mother   . Drug abuse Mother   . Hypertension Father   . Hypertension Sister   . Hypertension Sister   . Colon cancer Neg Hx     Social History:  Social  History   Socioeconomic History  . Marital status: Single    Spouse name: Not on file  . Number of children: Not on file  . Years of education: Not on file  . Highest education level: Not on file  Occupational History  . Not on file  Social Needs  . Financial resource strain: Not on file  . Food insecurity    Worry: Not on file    Inability: Not on file  . Transportation needs    Medical: Not on file    Non-medical: Not on file  Tobacco Use  . Smoking status: Never Smoker  . Smokeless tobacco: Never Used  Substance and Sexual Activity  . Alcohol use: No  . Drug use: No  . Sexual activity: Yes    Birth control/protection: Condom   Lifestyle  . Physical activity    Days per week: Not on file    Minutes per session: Not on file  . Stress: Not on file  Relationships  . Social Herbalist on phone: Not on file    Gets together: Not on file    Attends religious service: Not on file    Active member of club or organization: Not on file    Attends meetings of clubs or organizations: Not on file    Relationship status: Not on file  Other Topics Concern  . Not on file  Social History Narrative  . Not on file    Allergies:  Allergies  Allergen Reactions  . Ketorolac Tromethamine Hives    Lips swelled    Metabolic Disorder Labs: Lab Results  Component Value Date   HGBA1C 5.3 07/26/2010   No results found for: PROLACTIN Lab Results  Component Value Date   CHOL 166 12/27/2017   TRIG 50 12/27/2017   HDL 83 12/27/2017   CHOLHDL 2.0 12/27/2017   VLDL 10 01/02/2017   LDLCALC 71 12/27/2017   LDLCALC 74 01/02/2017   Lab Results  Component Value Date   TSH 0.375 11/19/2018   TSH 0.339 (L) 10/26/2018    Therapeutic Level Labs: No results found for: LITHIUM No results found for: VALPROATE No components found for:  CBMZ  Current Medications: Current Outpatient Medications  Medication Sig Dispense Refill  . amLODipine (NORVASC) 5 MG tablet Take 1 tablet (5 mg total) by mouth daily. 90 tablet 3  . buPROPion (WELLBUTRIN XL) 150 MG 24 hr tablet Take 1 tablet (150 mg total) by mouth daily. 90 tablet 0  . cyclobenzaprine (FLEXERIL) 10 MG tablet Take 1 tablet (10 mg total) by mouth 3 (three) times daily as needed for muscle spasms. 30 tablet 0  . ergocalciferol (VITAMIN D2) 1.25 MG (50000 UT) capsule Take 1 capsule (50,000 Units total) by mouth once a week. One capsule once weekly 12 capsule 3  . gabapentin (NEURONTIN) 100 MG capsule Take one to two capsules at bedtime for pain 30 capsule 1  . pantoprazole (PROTONIX) 40 MG tablet Take 1 tablet (40 mg total) by mouth daily. 145 tablet 0  . predniSONE  (DELTASONE) 20 MG tablet Take one tablet three times daily for 3 days, then take one tablet two times daily for 3 days , then take one tablet once daily for 3 days , then stop 18 tablet 0  . Suvorexant (BELSOMRA) 10 MG TABS Take 10 mg by mouth at bedtime. 30 tablet 1  . venlafaxine XR (EFFEXOR-XR) 150 MG 24 hr capsule 225 mg daily (150 mg+ 75 mg) 90 capsule  0  . venlafaxine XR (EFFEXOR-XR) 75 MG 24 hr capsule 225 mg daily (150 mg+ 75 mg) 90 capsule 0   No current facility-administered medications for this visit.      Musculoskeletal: Strength & Muscle Tone: N/A Gait & Station: N/A Patient leans: N/A  Psychiatric Specialty Exam: Review of Systems  Psychiatric/Behavioral: Positive for depression. Negative for hallucinations, memory loss, substance abuse and suicidal ideas. The patient is nervous/anxious and has insomnia.   All other systems reviewed and are negative.   There were no vitals taken for this visit.There is no height or weight on file to calculate BMI.  General Appearance: Fairly Groomed  Eye Contact:  Good  Speech:  Clear and Coherent  Volume:  Normal  Mood:  Depressed  Affect:  Appropriate, Congruent and Restricted  Thought Process:  Coherent  Orientation:  Full (Time, Place, and Person)  Thought Content: Logical   Suicidal Thoughts:  No  Homicidal Thoughts:  No  Memory:  Immediate;   Good  Judgement:  Good  Insight:  Fair  Psychomotor Activity:  Normal  Concentration:  Concentration: Good and Attention Span: Good  Recall:  Good  Fund of Knowledge: Good  Language: Good  Akathisia:  No  Handed:  Right  AIMS (if indicated): not done  Assets:  Communication Skills Desire for Improvement  ADL's:  Intact  Cognition: WNL  Sleep:  Poor   Screenings: GAD-7     Office Visit from 03/17/2019 in [facility] Counselor from 10/09/2018 in [place] [facility] Phone Follow Up from 08/04/2018 in [place] [facility] Phone Follow Up from 07/16/2018 in [facility] Office Visit from 07/09/2018 in [facility]  Total GAD-7 Score  5  19  12  12  15     PHQ2-9     Office Visit from 06/22/2019 in [facility] Office Visit from 10/26/2018 in [place] from 10/09/2018 in [facility] [place] Phone Follow Up from 08/04/2018 in [facility] Phone Follow Up from 07/16/2018 in [facility]  PHQ-2 Total Score  0  6  6  6  5   PHQ-9 Total Score  -  17  20  15  15        Assessment and Plan:  Olivia Woodward is a 53 y.o. year old female with a history of depression,iron deficiency anemia, hypertension,s/p gastric bypass surgery in 2017 , who presents for follow up appointment for depression.   # MDD, mild, recurrent without psychotic features Patient reports worsening in depressive symptoms and anxiety in the context of situation with her daughter's allegation.  This also coincided with the patient not being adherent to bupropion.  Other psychosocial stressors includes loss of her maternal grandmother.  We will initiate bupropion as adjunctive treatment for depression.  She has no known history of seizure. will continue venlafaxine to target depression.   # Insomnia We will reinitiate Belsomra now that the patient will be able to afford this medication.  She has limited benefit from other hypnotics as below. Discussed sleep hygiene.   Plan I have reviewed and updated plans as below 1.Continuevenlafaxine 225 mg daily 2.Reinitiatebupropion 150 mg daily  3. Reinitiate Belsomra 10 mg at night 4.Next appointment: 8/17 at 8:40 for 20 mins, video - Front desk to contact for theapy appointment  Past trials of medication:fluoxetine, lexapro,quetiapine (drowsiness),Xanax, temazepam,Trazodone,Ambien,Belsomra (could not afford)  The patient demonstrates the following  risk factors for suicide: Chronic risk factors for suicide include:psychiatric disorder ofdepression. Acute risk factorsfor suicide include: loss (financial, interpersonal, professional). Protective factorsfor this patient include: responsibility to others (children, family), coping skills, hope for the future and religious beliefs against suicide. Considering these factors, the overall suicide risk at thispoint appears to below. Patientisappropriate for outpatient follow up.  Norman Clay, MD 07/05/2019, 9:03 AM

## 2019-07-05 ENCOUNTER — Encounter (HOSPITAL_COMMUNITY): Payer: Self-pay | Admitting: Psychiatry

## 2019-07-05 ENCOUNTER — Ambulatory Visit (INDEPENDENT_AMBULATORY_CARE_PROVIDER_SITE_OTHER): Payer: 59 | Admitting: Psychiatry

## 2019-07-05 ENCOUNTER — Other Ambulatory Visit: Payer: Self-pay

## 2019-07-05 DIAGNOSIS — F330 Major depressive disorder, recurrent, mild: Secondary | ICD-10-CM | POA: Diagnosis not present

## 2019-07-05 MED ORDER — BUPROPION HCL ER (XL) 150 MG PO TB24: 150.0000 mg | ORAL_TABLET | Freq: Every day | ORAL | 0 refills | Status: DC

## 2019-07-05 MED ORDER — VENLAFAXINE HCL ER 150 MG PO CP24: ORAL_CAPSULE | ORAL | 0 refills | Status: DC

## 2019-07-05 MED ORDER — BELSOMRA 10 MG PO TABS: 10.0000 mg | ORAL_TABLET | Freq: Every day | ORAL | 1 refills | Status: DC

## 2019-07-05 MED ORDER — VENLAFAXINE HCL ER 75 MG PO CP24: ORAL_CAPSULE | ORAL | 0 refills | Status: DC

## 2019-07-05 NOTE — Patient Instructions (Signed)
1.Continuevenlafaxine 225 mg daily 2.Reinitiatebupropion 150 mg daily  3. Reinitiate Belsomra 10 mg at night 4.Next appointment: 8/17 at 8:40

## 2019-07-06 ENCOUNTER — Ambulatory Visit (HOSPITAL_COMMUNITY): Payer: Managed Care, Other (non HMO)

## 2019-07-07 ENCOUNTER — Other Ambulatory Visit: Payer: Self-pay

## 2019-07-07 ENCOUNTER — Encounter: Payer: Managed Care, Other (non HMO) | Admitting: Orthopaedic Surgery

## 2019-07-08 ENCOUNTER — Telehealth (HOSPITAL_COMMUNITY): Payer: Self-pay | Admitting: Hematology

## 2019-07-08 NOTE — Telephone Encounter (Signed)
Per Susa Raring is not req for [phone].

## 2019-07-09 ENCOUNTER — Inpatient Hospital Stay (HOSPITAL_COMMUNITY): Payer: Managed Care, Other (non HMO)

## 2019-07-09 ENCOUNTER — Other Ambulatory Visit: Payer: Self-pay

## 2019-07-09 ENCOUNTER — Encounter (HOSPITAL_COMMUNITY): Payer: Self-pay

## 2019-07-09 VITALS — BP 132/91 | HR 72 | Temp 97.5°F | Resp 18

## 2019-07-09 DIAGNOSIS — D509 Iron deficiency anemia, unspecified: Secondary | ICD-10-CM | POA: Diagnosis not present

## 2019-07-09 DIAGNOSIS — D649 Anemia, unspecified: Secondary | ICD-10-CM

## 2019-07-09 MED ORDER — SODIUM CHLORIDE 0.9 % IV SOLN
INTRAVENOUS | Status: DC
  Administered 2019-07-09: 10:00:00 via INTRAVENOUS

## 2019-07-09 MED ORDER — SODIUM CHLORIDE 0.9 % IV SOLN
510.0000 mg | Freq: Once | INTRAVENOUS | Status: AC
  Administered 2019-07-09: 10:00:00 510 mg via INTRAVENOUS
  Filled 2019-07-09: qty 510

## 2019-07-09 NOTE — Patient Instructions (Signed)
[facility] Discharge Instructions  Received Feraheme infusion today. Follow-up as scheduled. Call clinic for any questions or concerns   Thank you for choosing [facility] at [facility] to provide your oncology and hematology care.  To afford each patient quality time with our provider, please arrive at least 15 minutes before your scheduled appointment time.   If you have a lab appointment with the Cancer Center please come in thru the Main Entrance and check in at the main information desk.  You need to re-schedule your appointment should you arrive 10 or more minutes late.  We strive to give you quality time with our providers, and arriving late affects you and other patients whose appointments are after yours.  Also, if you no show three or more times for appointments you may be dismissed from the clinic at the providers discretion.     Again, thank you for choosing [facility].  Our hope is that these requests will decrease the amount of time that you wait before being seen by our physicians.       _____________________________________________________________  Should you have questions after your visit to  Cancer Center, please contact our office at [phone] between the hours of 8:00 a.m. and 4:30 p.m.  Voicemails left after 4:00 p.m. will not be returned until the following business day.  For prescription refill requests, have your pharmacy contact our office and allow 72 hours.    Due to Covid, you will need to wear a mask upon entering the hospital. If you do not have a mask, a mask will be given to you at the Main Entrance upon arrival. For doctor visits, patients may have 1 support person with them. For treatment visits, patients can not have anyone with them due to social distancing guidelines and our immunocompromised population.     

## 2019-07-09 NOTE — Progress Notes (Signed)
Olivia Woodward tolerated Feraheme infusion well without complaints or incident. Peripheral IV site checked with positive blood return noted prior to and after infusion. VSS upon discharge. Pt discharged self ambulatory in satisfactory condition

## 2019-07-15 ENCOUNTER — Encounter: Payer: Self-pay | Admitting: Family Medicine

## 2019-07-15 ENCOUNTER — Ambulatory Visit (INDEPENDENT_AMBULATORY_CARE_PROVIDER_SITE_OTHER): Payer: Managed Care, Other (non HMO) | Admitting: Family Medicine

## 2019-07-15 ENCOUNTER — Other Ambulatory Visit: Payer: Self-pay

## 2019-07-15 VITALS — BP 131/85 | Ht 66.0 in | Wt 246.0 lb

## 2019-07-15 DIAGNOSIS — R600 Localized edema: Secondary | ICD-10-CM

## 2019-07-15 DIAGNOSIS — I10 Essential (primary) hypertension: Secondary | ICD-10-CM | POA: Diagnosis not present

## 2019-07-15 DIAGNOSIS — Z1231 Encounter for screening mammogram for malignant neoplasm of breast: Secondary | ICD-10-CM

## 2019-07-15 DIAGNOSIS — D508 Other iron deficiency anemias: Secondary | ICD-10-CM

## 2019-07-15 DIAGNOSIS — F322 Major depressive disorder, single episode, severe without psychotic features: Secondary | ICD-10-CM | POA: Diagnosis not present

## 2019-07-15 MED ORDER — PHENTERMINE HCL 37.5 MG PO TABS: ORAL_TABLET | ORAL | 3 refills | Status: DC

## 2019-07-15 MED ORDER — FUROSEMIDE 20 MG PO TABS: ORAL_TABLET | ORAL | 5 refills | Status: DC

## 2019-07-15 MED ORDER — POTASSIUM CHLORIDE CRYS ER 20 MEQ PO TBCR: EXTENDED_RELEASE_TABLET | ORAL | 5 refills | Status: DC

## 2019-07-15 NOTE — Progress Notes (Signed)
I connected with  Olivia Woodward on 07/18/19 by a video enabled telemedicine application and verified that I am speaking with the correct person using two identifiers.   I discussed the limitations of evaluation and management by telemedicine. The patient expressed understanding and agreed to proceed.  Virtual Visit via Telephone Note  I connected with Olivia Woodward on 07/18/19 at  4:00 PM EDT by telephone and verified that I am speaking with the correct person using two identifiers.  Location: Patient: home Provider: office   I discussed the limitations, risks, security and privacy concerns of performing an evaluation and management service by telephone and the availability of in person appointments. I also discussed with the patient that there may be a patient responsible charge related to this service. The patient expressed understanding and agreed to proceed.   History of Present Illness: F/u chronic problems C/o leg swelling at the end of the  Day , requests diuretic for as needed use C/o weight gain, especially being home bound, requests phentermine to help with appetite suppression Denies recent fever or chills. Denies sinus pressure, nasal congestion, ear pain or sore throat. Denies chest congestion, productive cough or wheezing. Denies chest pains, palpitations and leg swelling Denies abdominal pain, nausea, vomiting,diarrhea or constipation.   Denies dysuria, frequency, hesitancy or incontinence. Denies joint pain, swelling and limitation in mobility. Denies headaches, seizures, numbness, or tingling. Denies uncontrolled  depression, anxiety or insomnia. Denies skin break down or rash.       Observations/Objective:  BP 131/85   Ht 5\' 6"  (1.676 m)   Wt 246 lb (111.6 kg)   BMI 39.71 kg/m  Good communication with no confusion and intact memory. Alert and oriented x 3 No signs of respiratory distress during speech   Assessment and Plan:  Essential  hypertension Controlled, no change in medication DASH diet and commitment to daily physical activity for a minimum of 30 minutes discussed and encouraged, as a part of hypertension management. The importance of attaining a healthy weight is also discussed.  BP/Weight 07/15/2019 07/09/2019 06/24/2019 06/24/2019 06/22/2019 [id number]  Systolic BP A999333 Q000111Q - A999333 [id number]  Diastolic BP 85 91 - 85 86 83 92  Wt. (Lbs) 246 - 248 246 245 235 243.4  BMI 39.71 - 40.03 39.71 39.54 37.93 39.29  Some encounter information is confidential and restricted. Go to Review Flowsheets activity to see all data.       Depression, major, single episode, severe ([place]) Improved, being treated by Psych  Morbid obesity ([place]) Obesity linked with hypertension and depression  Patient re-educated about  the importance of commitment to a  minimum of 150 minutes of exercise per week as able.  The importance of healthy food choices with portion control discussed, as well as eating regularly and within a 12 hour window most days. The need to choose "clean , green" food 50 to 75% of the time is discussed, as well as to make water the primary drink and set a goal of 64 ounces water daily.   Start half phentermine daily, commit to regular exercise and weekly weighing Weight /BMI 07/15/2019 06/24/2019 06/24/2019  WEIGHT 246 lb 248 lb 246 lb  HEIGHT 5\' 6"  5\' 6"  -  BMI 39.71 kg/m2 40.03 kg/m2 39.71 kg/m2  Some encounter information is confidential and restricted. Go to Review Flowsheets activity to see all data.      Bilateral leg edema Advised salt restriction, leg elevation and as needed lasix and potassium are  prescribed  IRON DEFIC ANEMIA SEC DIET IRON INTAKE Improving,  Being treated by hematology   Follow Up Instructions:    I discussed the assessment and treatment plan with the patient. The patient was provided an opportunity to ask questions and all were answered. The patient agreed with the plan and  demonstrated an understanding of the instructions.   The patient was advised to call back or seek an in-person evaluation if the symptoms worsen or if the condition fails to improve as anticipated.  I provided 22 minutes of non-face-to-face time during this encounter.   Tula Nakayama, MD

## 2019-07-15 NOTE — Patient Instructions (Addendum)
F./u with MD in mid December, call if you need me before  Mammogram to be scheduled, appt will be mailed , mnorning appointment preferred  Come as arranged for your flu vaccine  New is half phentermine daily  New are lasix and potassium for leg swelling 1500 calorie diet sheet enclosed     Weight loss goal of 3 to 4 pounds / month  Please replace battery and weigh weekly  It is important that you exercise regularly at least 30 minutes 5 times a week. If you develop chest pain, have severe difficulty breathing, or feel very tired, stop exercising immediately and seek medical attention   Thanks for choosing [facility], we consider it a privelige to serve you.

## 2019-07-18 ENCOUNTER — Encounter: Payer: Self-pay | Admitting: Family Medicine

## 2019-07-18 DIAGNOSIS — R600 Localized edema: Secondary | ICD-10-CM | POA: Insufficient documentation

## 2019-07-18 NOTE — Assessment & Plan Note (Signed)
Controlled, no change in medication DASH diet and commitment to daily physical activity for a minimum of 30 minutes discussed and encouraged, as a part of hypertension management. The importance of attaining a healthy weight is also discussed.  BP/Weight 07/15/2019 07/09/2019 06/24/2019 06/24/2019 06/22/2019 [id number]  Systolic BP A999333 Q000111Q - A999333 123456 [id number]  Diastolic BP 85 91 - 85 86 83 92  Wt. (Lbs) 246 - 248 246 245 235 243.4  BMI 39.71 - 40.03 39.71 39.54 37.93 39.29  Some encounter information is confidential and restricted. Go to Review Flowsheets activity to see all data.

## 2019-07-18 NOTE — Assessment & Plan Note (Signed)
Improved, being treated by Psych

## 2019-07-18 NOTE — Assessment & Plan Note (Signed)
Improving,  Being treated by hematology

## 2019-07-18 NOTE — Assessment & Plan Note (Addendum)
Obesity linked with hypertension and depression  Patient re-educated about  the importance of commitment to a  minimum of 150 minutes of exercise per week as able.  The importance of healthy food choices with portion control discussed, as well as eating regularly and within a 12 hour window most days. The need to choose "clean , green" food 50 to 75% of the time is discussed, as well as to make water the primary drink and set a goal of 64 ounces water daily.   Start half phentermine daily, commit to regular exercise and weekly weighing Weight /BMI 07/15/2019 06/24/2019 06/24/2019  WEIGHT 246 lb 248 lb 246 lb  HEIGHT 5\' 6"  5\' 6"  -  BMI 39.71 kg/m2 40.03 kg/m2 39.71 kg/m2  Some encounter information is confidential and restricted. Go to Review Flowsheets activity to see all data.

## 2019-07-18 NOTE — Assessment & Plan Note (Signed)
Advised salt restriction, leg elevation and as needed lasix and potassium are prescribed

## 2019-07-19 ENCOUNTER — Ambulatory Visit (INDEPENDENT_AMBULATORY_CARE_PROVIDER_SITE_OTHER): Payer: Managed Care, Other (non HMO)

## 2019-07-19 ENCOUNTER — Other Ambulatory Visit: Payer: Self-pay

## 2019-07-19 DIAGNOSIS — Z23 Encounter for immunization: Secondary | ICD-10-CM | POA: Diagnosis not present

## 2019-07-22 ENCOUNTER — Ambulatory Visit: Payer: Self-pay | Admitting: Family Medicine

## 2019-07-27 ENCOUNTER — Ambulatory Visit (HOSPITAL_COMMUNITY): Payer: Managed Care, Other (non HMO)

## 2019-07-28 ENCOUNTER — Ambulatory Visit (HOSPITAL_COMMUNITY): Payer: Managed Care, Other (non HMO)

## 2019-08-02 ENCOUNTER — Ambulatory Visit (HOSPITAL_COMMUNITY): Payer: Managed Care, Other (non HMO)

## 2019-08-10 ENCOUNTER — Ambulatory Visit: Payer: 59 | Admitting: Psychiatry
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
## 2019-08-17 ENCOUNTER — Encounter: Payer: Self-pay | Admitting: Family Medicine

## 2019-08-17 ENCOUNTER — Other Ambulatory Visit: Payer: Self-pay | Admitting: Family Medicine

## 2019-08-17 MED ORDER — MUPIROCIN 2 % EX OINT: TOPICAL_OINTMENT | CUTANEOUS | 0 refills | Status: DC

## 2019-08-19 ENCOUNTER — Ambulatory Visit (HOSPITAL_COMMUNITY): Payer: Managed Care, Other (non HMO)

## 2019-08-24 ENCOUNTER — Ambulatory Visit (INDEPENDENT_AMBULATORY_CARE_PROVIDER_SITE_OTHER): Payer: 59 | Admitting: Psychiatry

## 2019-08-24 ENCOUNTER — Other Ambulatory Visit: Payer: Self-pay

## 2019-08-24 ENCOUNTER — Encounter (HOSPITAL_COMMUNITY): Payer: Self-pay | Admitting: Psychiatry

## 2019-08-24 DIAGNOSIS — F330 Major depressive disorder, recurrent, mild: Secondary | ICD-10-CM | POA: Diagnosis not present

## 2019-08-24 MED ORDER — BELSOMRA 10 MG PO TABS: 10.0000 mg | ORAL_TABLET | Freq: Every day | ORAL | 2 refills | Status: DC

## 2019-08-24 MED ORDER — BUPROPION HCL ER (XL) 150 MG PO TB24: 150.0000 mg | ORAL_TABLET | Freq: Every day | ORAL | 0 refills | Status: DC

## 2019-08-24 NOTE — Patient Instructions (Signed)
1. Reinitiatebupropion 150 mg daily  2. Continue Belsomra 10 mg at night 3.Next appointment:  In December

## 2019-08-26 ENCOUNTER — Encounter (HOSPITAL_COMMUNITY): Payer: Self-pay

## 2019-08-26 ENCOUNTER — Other Ambulatory Visit: Payer: Self-pay

## 2019-08-26 ENCOUNTER — Inpatient Hospital Stay (HOSPITAL_COMMUNITY): Payer: Managed Care, Other (non HMO) | Attending: Nurse Practitioner

## 2019-08-26 VITALS — BP 130/78 | HR 75 | Temp 97.8°F | Resp 16

## 2019-08-26 DIAGNOSIS — E538 Deficiency of other specified B group vitamins: Secondary | ICD-10-CM | POA: Insufficient documentation

## 2019-08-26 DIAGNOSIS — D649 Anemia, unspecified: Secondary | ICD-10-CM

## 2019-08-26 MED ORDER — CYANOCOBALAMIN 1000 MCG/ML IJ SOLN
1000.0000 ug | Freq: Once | INTRAMUSCULAR | Status: AC
  Administered 2019-08-26: 1000 ug via INTRAMUSCULAR
  Filled 2019-08-26: qty 1

## 2019-08-26 NOTE — Progress Notes (Signed)
Patient tolerated injection with no complaints voiced.  Site clean and dry with no bruising or swelling noted at site.  Band aid applied.  VSS with discharge and left ambulatory with no s/s of distress noted.    

## 2019-09-02 ENCOUNTER — Other Ambulatory Visit: Payer: Self-pay

## 2019-09-02 ENCOUNTER — Ambulatory Visit (HOSPITAL_COMMUNITY)
Admission: RE | Admit: 2019-09-02 | Discharge: 2019-09-02 | Disposition: A | Discharge status: Home / Self Care | Payer: Managed Care, Other (non HMO) | Source: Ambulatory Visit | Attending: Family Medicine | Admitting: Family Medicine

## 2019-09-02 DIAGNOSIS — Z1231 Encounter for screening mammogram for malignant neoplasm of breast: Secondary | ICD-10-CM | POA: Insufficient documentation

## 2019-09-27 ENCOUNTER — Other Ambulatory Visit (HOSPITAL_COMMUNITY): Payer: Managed Care, Other (non HMO)

## 2019-09-27 ENCOUNTER — Inpatient Hospital Stay (HOSPITAL_COMMUNITY): Payer: Managed Care, Other (non HMO)

## 2019-09-28 ENCOUNTER — Ambulatory Visit (HOSPITAL_COMMUNITY): Payer: Managed Care, Other (non HMO)

## 2019-09-28 ENCOUNTER — Ambulatory Visit (HOSPITAL_COMMUNITY): Payer: Managed Care, Other (non HMO) | Admitting: Nurse Practitioner

## 2019-10-26 ENCOUNTER — Ambulatory Visit (HOSPITAL_COMMUNITY): Payer: 59 | Admitting: Psychiatry

## 2019-11-02 ENCOUNTER — Encounter: Payer: Self-pay | Admitting: Psychiatry

## 2019-11-02 ENCOUNTER — Other Ambulatory Visit: Payer: Self-pay

## 2019-11-02 ENCOUNTER — Ambulatory Visit (INDEPENDENT_AMBULATORY_CARE_PROVIDER_SITE_OTHER): Payer: 59 | Admitting: Psychiatry

## 2019-11-02 DIAGNOSIS — F419 Anxiety disorder, unspecified: Secondary | ICD-10-CM

## 2019-11-02 DIAGNOSIS — F331 Major depressive disorder, recurrent, moderate: Secondary | ICD-10-CM

## 2019-11-02 MED ORDER — VILAZODONE HCL 20 MG PO TABS: 20.0000 mg | ORAL_TABLET | Freq: Every day | ORAL | 1 refills | Status: DC

## 2019-11-02 MED ORDER — SUVOREXANT 20 MG PO TABS: 20.0000 mg | ORAL_TABLET | Freq: Every day | ORAL | 1 refills | Status: DC

## 2019-11-02 MED ORDER — CLONAZEPAM 0.5 MG PO TABS: 0.5000 mg | ORAL_TABLET | Freq: Every day | ORAL | 1 refills | Status: DC | PRN
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
## 2019-11-04 ENCOUNTER — Other Ambulatory Visit: Payer: Self-pay

## 2019-11-04 ENCOUNTER — Ambulatory Visit (INDEPENDENT_AMBULATORY_CARE_PROVIDER_SITE_OTHER): Payer: Managed Care, Other (non HMO) | Admitting: Family Medicine

## 2019-11-04 ENCOUNTER — Encounter: Payer: Self-pay | Admitting: Family Medicine

## 2019-11-04 VITALS — BP 130/80 | Ht 66.0 in | Wt 243.0 lb

## 2019-11-04 DIAGNOSIS — M7989 Other specified soft tissue disorders: Secondary | ICD-10-CM | POA: Diagnosis not present

## 2019-11-04 DIAGNOSIS — M549 Dorsalgia, unspecified: Secondary | ICD-10-CM

## 2019-11-04 DIAGNOSIS — Z13220 Encounter for screening for lipoid disorders: Secondary | ICD-10-CM

## 2019-11-04 DIAGNOSIS — I10 Essential (primary) hypertension: Secondary | ICD-10-CM

## 2019-11-04 DIAGNOSIS — F331 Major depressive disorder, recurrent, moderate: Secondary | ICD-10-CM | POA: Diagnosis not present

## 2019-11-04 DIAGNOSIS — M545 Low back pain, unspecified: Secondary | ICD-10-CM

## 2019-11-04 DIAGNOSIS — G8929 Other chronic pain: Secondary | ICD-10-CM

## 2019-11-04 DIAGNOSIS — E559 Vitamin D deficiency, unspecified: Secondary | ICD-10-CM

## 2019-11-04 MED ORDER — AMLODIPINE BESYLATE 5 MG PO TABS: 5.0000 mg | ORAL_TABLET | Freq: Every day | ORAL | 3 refills | Status: DC

## 2019-11-04 MED ORDER — FUROSEMIDE 20 MG PO TABS: 20.0000 mg | ORAL_TABLET | Freq: Every day | ORAL | 3 refills | Status: DC

## 2019-11-04 MED ORDER — CYCLOBENZAPRINE HCL 10 MG PO TABS: ORAL_TABLET | ORAL | 1 refills | Status: DC

## 2019-11-04 MED ORDER — GABAPENTIN 300 MG PO CAPS: 300.0000 mg | ORAL_CAPSULE | Freq: Every day | ORAL | 3 refills | Status: DC

## 2019-11-04 MED ORDER — POTASSIUM CHLORIDE CRYS ER 20 MEQ PO TBCR: 20.0000 meq | EXTENDED_RELEASE_TABLET | Freq: Every day | ORAL | 3 refills | Status: DC

## 2019-11-04 NOTE — Patient Instructions (Addendum)
F/u in office with MD in 4 months, call if you need me before  Fasting lipid, cmp and eGFR, TSH and vit D 1 week before next visit   It is important that you exercise regularly at least 30 minutes 5 times a week. If you develop chest pain, have severe difficulty breathing, or feel very tired, stop exercising immediately and seek medical attention   Think about what you will eat, plan ahead. Choose " clean, green, fresh or frozen" over canned, processed or packaged foods which are more sugary, salty and fatty. 70 to 75% of food eaten should be vegetables and fruit. Three meals at set times with snacks allowed between meals, but they must be fruit or vegetables. Aim to eat over a 12 hour period , example 7 am to 7 pm, and STOP after  your last meal of the day. Drink water,generally about 64 ounces per day, no other drink is as healthy. Fruit juice is best enjoyed in a healthy way, by EATING the fruit.

## 2019-11-04 NOTE — Progress Notes (Signed)
Virtual Visit via Telephone Note  I connected with Olivia Woodward on 11/04/19 at  9:20 AM EST by telephone and verified that I am speaking with the correct person using two identifiers.  Location: Patient: home Provider: office   I discussed the limitations, risks, security and privacy concerns of performing an evaluation and management service by telephone and the availability of in person appointments. I also discussed with the patient that there may be a patient responsible charge related to this service. The patient expressed understanding and agreed to proceed.   History of Present Illness: Denies recent fever or chills. Denies sinus pressure, nasal congestion, ear pain or sore throat. Denies chest congestion, productive cough or wheezing. Denies chest pains, palpitations and c/o  leg swelling relieved with furosemoid Denies abdominal pain, nausea, vomiting,diarrhea or constipation.   Denies dysuria, frequency, hesitancy or incontinence. C/o  joint pain, and limitation in mobility.inparticular low back and shoulder, has not been taking taking gabapentin daily, but will start Denies headaches, seizures, numbness, or tingling. C/o  depression, anxiety and  Insomnia.Managed by Psych, improved control Denies skin break down or rash.       There were no vitals taken for this visit. Good communication with no confusion and intact memory. Alert and oriented x 3 No signs of respiratory distress during speech    Assessment and Plan: Essential hypertension Controlled, no change in medication DASH diet and commitment to daily physical activity for a minimum of 30 minutes discussed and encouraged, as a part of hypertension management. The importance of attaining a healthy weight is also discussed.  BP/Weight 11/04/2019 08/26/2019 07/15/2019 07/09/2019 06/24/2019 Q000111Q [id number]  Systolic BP [id number] - [id number]  Diastolic BP 80 78 85 91 - 85 86  Wt. (Lbs) 243 - 246 - 248 246  245  BMI 39.22 - 39.71 - 40.03 39.71 39.54  Some encounter information is confidential and restricted. Go to Review Flowsheets activity to see all data.       MDD (major depressive disorder), recurrent episode, moderate ([place]) Managed by Psych and improved control on currentmedication  Back pain No current flare, continue bedtime flexeril and commit to nightly gabapentin  Leg swelling Chronic and controlled with daily furosemide an potassium, continue same and elevate legs when able    Follow Up Instructions:    I discussed the assessment and treatment plan with the patient. The patient was provided an opportunity to ask questions and all were answered. The patient agreed with the plan and demonstrated an understanding of the instructions.   The patient was advised to call back or seek an in-person evaluation if the symptoms worsen or if the condition fails to improve as anticipated.  I provided 20 minutes of non-face-to-face time during this encounter.   Tula Nakayama, MD

## 2019-11-06 ENCOUNTER — Encounter: Payer: Self-pay | Admitting: Family Medicine

## 2019-11-06 DIAGNOSIS — M7989 Other specified soft tissue disorders: Secondary | ICD-10-CM | POA: Insufficient documentation

## 2019-11-06 NOTE — Assessment & Plan Note (Signed)
Chronic and controlled with daily furosemide an potassium, continue same and elevate legs when able

## 2019-11-06 NOTE — Assessment & Plan Note (Signed)
Controlled, no change in medication DASH diet and commitment to daily physical activity for a minimum of 30 minutes discussed and encouraged, as a part of hypertension management. The importance of attaining a healthy weight is also discussed.  BP/Weight 11/04/2019 08/26/2019 07/15/2019 07/09/2019 06/24/2019 Q000111Q [id number]  Systolic BP [id number] [id number] - [id number]  Diastolic BP 80 78 85 91 - 85 86  Wt. (Lbs) 243 - 246 - 248 246 245  BMI 39.22 - 39.71 - 40.03 39.71 39.54  Some encounter information is confidential and restricted. Go to Review Flowsheets activity to see all data.

## 2019-11-06 NOTE — Assessment & Plan Note (Signed)
No current flare, continue bedtime flexeril and commit to nightly gabapentin

## 2019-11-06 NOTE — Assessment & Plan Note (Signed)
Managed by Psych and improved control on currentmedication

## 2019-11-08 ENCOUNTER — Ambulatory Visit: Payer: Managed Care, Other (non HMO) | Admitting: Family Medicine

## 2019-11-23 ENCOUNTER — Ambulatory Visit: Payer: Managed Care, Other (non HMO) | Attending: Internal Medicine

## 2019-11-23 ENCOUNTER — Other Ambulatory Visit: Payer: Self-pay | Admitting: Family Medicine

## 2019-11-23 ENCOUNTER — Other Ambulatory Visit: Payer: Self-pay

## 2019-11-23 DIAGNOSIS — Z20822 Contact with and (suspected) exposure to covid-19: Secondary | ICD-10-CM

## 2019-11-23 MED ORDER — MUPIROCIN 2 % EX OINT: TOPICAL_OINTMENT | CUTANEOUS | 0 refills | Status: DC

## 2019-11-25 ENCOUNTER — Encounter: Payer: Self-pay | Admitting: Family Medicine

## 2019-11-25 LAB — NOVEL CORONAVIRUS, NAA: SARS-CoV-2, NAA: NOT DETECTED

## 2019-12-02 ENCOUNTER — Telehealth: Payer: Self-pay

## 2019-12-02 NOTE — Telephone Encounter (Signed)
received a notice of a prior auth needed for belsomra for pt

## 2019-12-02 NOTE — Telephone Encounter (Signed)
received notice that prior auth for belsomra 20mg  was approved from  12-02-19 to  05-31-20

## 2019-12-02 NOTE — Telephone Encounter (Signed)
Dr.Kaur's patient

## 2019-12-02 NOTE — Telephone Encounter (Signed)
went online and submittd the prior auth- pending

## 2019-12-03 ENCOUNTER — Telehealth (HOSPITAL_COMMUNITY): Payer: Self-pay | Admitting: *Deleted

## 2019-12-03 MED ORDER — DOXEPIN HCL 50 MG PO CAPS: 50.0000 mg | ORAL_CAPSULE | Freq: Every day | ORAL | 1 refills | Status: DC

## 2019-12-03 NOTE — Telephone Encounter (Signed)
Patient called stated that Belsomra will now cost  $300 & request something else for sleep

## 2019-12-03 NOTE — Telephone Encounter (Signed)
Look at telephone encounter from Vernell Morgans from yesterday, Olivia Woodward was approved.  As per her note- received notice that prior auth for belsomra 20mg  was approved from  12-02-19 to  05-31-20.

## 2019-12-03 NOTE — Telephone Encounter (Signed)
[name] clarified that the cost of Belsomra is still very high despite of PA approval by insurance and the pt would like to try something else.  I called and spoke with the pt. She was offered Doxepin 50 mg at bedtime for insomnia. Potential side effects of medication and risks vs benefits of treatment vs non-treatment were explained and discussed. All questions were answered.

## 2019-12-03 NOTE — Addendum Note (Signed)
Addended by: Nevada Crane on: 12/03/2019 11:58 AM   Modules accepted: Orders

## 2019-12-06 ENCOUNTER — Telehealth (HOSPITAL_COMMUNITY): Payer: Self-pay | Admitting: *Deleted

## 2019-12-06 MED ORDER — MIRTAZAPINE 15 MG PO TABS: 15.0000 mg | ORAL_TABLET | Freq: Every day | ORAL | 1 refills | Status: DC

## 2019-12-06 NOTE — Telephone Encounter (Signed)
Patient called requested call from you stated that she can't afford the medication just prescribed the cost is  $ 296.00

## 2019-12-06 NOTE — Telephone Encounter (Addendum)
I called and spoke with the pt. She informed that she could not pick up refill for Viibryd as it will cost her more than $200. She tried Doxepin for insomnia and did not find it to be helpful. Pt is under a lot of stress due to things going on with her teenage daughter.  Patient stated there is a lot going on and everything is up in the air.  She is living in a lot of uncertainty.  Patient was offered mirtazapine 15 mg at bedtime.  Patient was explained that it helps with depression, anxiety and also with insomnia. Potential side effects of medication and risks vs benefits of treatment vs non-treatment were explained and discussed. All questions were answered.

## 2019-12-06 NOTE — Telephone Encounter (Signed)
Spoke with Rx:  DOXEPIN  $17.56 (picked up 12/03/19)    BELSOMRA $228.63 (- deductible)         VIIBYRD  $296.00( + DEDUCTIBLE)

## 2019-12-06 NOTE — Telephone Encounter (Deleted)
Can you talk to the pt regarding this and let me know if she has any questions or concerns, thanks.

## 2019-12-06 NOTE — Telephone Encounter (Signed)
Can you call the patient's pharmacy and verify this ? Last week she informed that Ambien would be >$300 even after we did a PA. Verify the costs of all the sleeping agents from pharmacy so that we can prescribe accordingly.

## 2019-12-06 NOTE — Addendum Note (Signed)
Addended by: Nevada Crane on: 12/06/2019 03:58 PM   Modules accepted: Orders

## 2019-12-07 NOTE — Telephone Encounter (Signed)
Janett Billow- thanks. Direce- FYI.

## 2019-12-07 NOTE — Telephone Encounter (Signed)
Prior auth for belsomra 20mg  was approved from  12-02-19 to 05-31-20

## 2019-12-13 ENCOUNTER — Telehealth (HOSPITAL_COMMUNITY): Payer: Self-pay | Admitting: *Deleted

## 2019-12-13 MED ORDER — ALPRAZOLAM 0.5 MG PO TABS: 0.5000 mg | ORAL_TABLET | Freq: Every day | ORAL | 0 refills | Status: DC | PRN

## 2019-12-13 MED ORDER — MIRTAZAPINE 15 MG PO TABS: 15.0000 mg | ORAL_TABLET | Freq: Every day | ORAL | 0 refills | Status: DC

## 2019-12-13 NOTE — Addendum Note (Signed)
Addended by: Nevada Crane on: 12/13/2019 11:31 AM   Modules accepted: Orders

## 2019-12-13 NOTE — Telephone Encounter (Signed)
I called the patient and spoke with her.  Patient reported that she has continued to take doxepin 50 mg at bedtime.  She is sleeping a little better however she feels very drained and tired and exhausted when she wakes up in the morning. She stated she cannot keep working like this.  She works as a Engineer, technical sales for [employer]. She was inquired about mirtazapine use.  She stated she was not aware that prescription was ready for her to be picked up.  Patient was informed that prescription was sent on January 18. Patient also reported feeling sleepy after taking clonazepam as needed for anxiety.  She reported she is taking Xanax with good effect in the past and requested refills for the same.  Patient was advised to discontinue doxepin due to next morning grogginess. Another prescription for mirtazapine 15 mg at bedtime was sent to her pharmacy. Patient was advised to discontinue clonazepam due to sleepiness and new prescription for Xanax 0.5 mg as needed was sent to her pharmacy.

## 2019-12-13 NOTE — Telephone Encounter (Signed)
PATIENT CALLED LVM REQUESTING CALL BACK CONCERNING MEDICATION

## 2019-12-31 ENCOUNTER — Ambulatory Visit (INDEPENDENT_AMBULATORY_CARE_PROVIDER_SITE_OTHER): Payer: 59 | Admitting: Psychiatry

## 2019-12-31 ENCOUNTER — Other Ambulatory Visit: Payer: Self-pay

## 2019-12-31 ENCOUNTER — Encounter: Payer: Self-pay | Admitting: Psychiatry

## 2019-12-31 DIAGNOSIS — F419 Anxiety disorder, unspecified: Secondary | ICD-10-CM | POA: Diagnosis not present

## 2019-12-31 DIAGNOSIS — F3341 Major depressive disorder, recurrent, in partial remission: Secondary | ICD-10-CM

## 2019-12-31 MED ORDER — ALPRAZOLAM 1 MG PO TABS: 1.0000 mg | ORAL_TABLET | Freq: Every evening | ORAL | 1 refills | Status: DC

## 2019-12-31 MED ORDER — MIRTAZAPINE 15 MG PO TABS: 15.0000 mg | ORAL_TABLET | Freq: Every day | ORAL | 0 refills | Status: DC

## 2019-12-31 NOTE — Progress Notes (Signed)
[facility] MD OP Progress Note  I connected with  Olivia Woodward on 12/31/19 by a video enabled telemedicine application and verified that I am speaking with the correct person using two identifiers.   I discussed the limitations of evaluation and management by telemedicine. The patient expressed understanding and agreed to proceed.    12/31/2019 9:26 AM Olivia Woodward  MRN:  [id number]  Chief Complaint:  " I am doing better than before."  HPI: Writer has been in constant touch with the patient since her last visit via phone.  After going through a few medications patient was put back on Xanax 0.5 mg as needed for anxiety and prescribed mirtazapine 15 mg at bedtime for depression symptoms. Today patient reported that she is feeling better than before.  She informed that mirtazapine 15 mg at bedtime with 2 tablets of Xanax 0.5 mg strength at bedtime has been helping her rest better.  Her depression symptoms have improved to an extent. She stated that she does need to take 2 tablets of Xanax 0.5 mg in the evening as 1 tablet does not do much.  She spoke about her daughters legal court proceedings.  She informed that the culprit has been served with Justice and she is satisfied with the outcomes by the court.  She feels a sense of relief as of burn has been lifted off her shoulders.  She stated that right now her focus is to get her daughter back on track as she is still struggling with academics.  She wants her daughter to be able to move on and put all that behind her.  Patient spoke about how she was in the dark place herself more than 8 years ago and it took her a couple of years to get back to herself.  She was successfully able to stay off medications for 8 years and then now she has been hit hard with depression again.    Visit Diagnosis:    ICD-10-CM   1. MDD (major depressive disorder), recurrent, in partial remission ([place])  F33.41   2. Anxiety  F41.9     Past Psychiatric History:  Depression, anxiety  Past Medical History:  Past Medical History:  Diagnosis Date  . Anemia   . Generalized headaches   . Hypertension     Past Surgical History:  Procedure Laterality Date  . ABDOMINAL HYSTERECTOMY     fibroids, partial  . BREAST REDUCTION SURGERY  2005  . COLONOSCOPY WITH PROPOFOL N/A 05/10/2019   Procedure: COLONOSCOPY WITH PROPOFOL;  Surgeon: Daneil Dolin, MD;  [facility];  Service: Endoscopy;  Laterality: N/A;  2:45pm  . GASTRIC BYPASS  2007  . POLYPECTOMY  05/10/2019   Procedure: POLYPECTOMY;  Surgeon: Daneil Dolin, MD;  Location: [facility];  Service: Endoscopy;;  colon    Family Psychiatric History: See Below  Family History:  Family History  Problem Relation Age of Onset  . Hypertension Mother   . Diabetes Mother   . Drug abuse Mother   . Hypertension Father   . Hypertension Sister   . Hypertension Sister   . Colon cancer Neg Hx     Social History:  Social History   Socioeconomic History  . Marital status: Single    Spouse name: Not on file  . Number of children: Not on file  . Years of education: Not on file  . Highest education level: Not on file  Occupational History  . Not on file  Tobacco Use  . Smoking status: Never Smoker  . Smokeless tobacco: Never Used  Substance and Sexual Activity  . Alcohol use: No  . Drug use: No  . Sexual activity: Yes    Birth control/protection: Condom  Other Topics Concern  . Not on file  Social History Narrative  . Not on file   Social Determinants of Health   Financial Resource Strain:   . Difficulty of Paying Living Expenses: Not on file  Food Insecurity:   . Worried About Charity fundraiser in the Last Year: Not on file  . Ran Out of Food in the Last Year: Not on file  Transportation Needs:   . Lack of Transportation (Medical): Not on file  . Lack of Transportation (Non-Medical): Not on file  Physical Activity:   . Days of Exercise per Week: Not on file  . Minutes  of Exercise per Session: Not on file  Stress:   . Feeling of Stress : Not on file  Social Connections:   . Frequency of Communication with Friends and Family: Not on file  . Frequency of Social Gatherings with Friends and Family: Not on file  . Attends Religious Services: Not on file  . Active Member of Clubs or Organizations: Not on file  . Attends Archivist Meetings: Not on file  . Marital Status: Not on file    Allergies:  Allergies  Allergen Reactions  . Ketorolac Tromethamine Hives    Lips swelled    Metabolic Disorder Labs: Lab Results  Component Value Date   HGBA1C 5.3 07/26/2010   No results found for: PROLACTIN Lab Results  Component Value Date   CHOL 166 12/27/2017   TRIG 50 12/27/2017   HDL 83 12/27/2017   CHOLHDL 2.0 12/27/2017   VLDL 10 01/02/2017   LDLCALC 71 12/27/2017   LDLCALC 74 01/02/2017   Lab Results  Component Value Date   TSH 0.375 11/19/2018   TSH 0.339 (L) 10/26/2018    Therapeutic Level Labs: No results found for: LITHIUM No results found for: VALPROATE No components found for:  CBMZ  Current Medications: Current Outpatient Medications  Medication Sig Dispense Refill  . ALPRAZolam (XANAX) 0.5 MG tablet Take 1 tablet (0.5 mg total) by mouth daily as needed for anxiety. 30 tablet 0  . amLODipine (NORVASC) 5 MG tablet Take 1 tablet (5 mg total) by mouth daily. 90 tablet 3  . Cyanocobalamin (B-12) 2500 MCG TABS Take 1 tablet by mouth daily.    . cyclobenzaprine (FLEXERIL) 10 MG tablet Take one tablet by mouht atr bedtime foir uncontrolled back and shoulder pain and spasm 30 tablet 1  . ergocalciferol (VITAMIN D2) 1.25 MG (50000 UT) capsule Take 1 capsule (50,000 Units total) by mouth once a week. One capsule once weekly 12 capsule 3  . furosemide (LASIX) 20 MG tablet Take 1 tablet (20 mg total) by mouth daily. 90 tablet 3  . gabapentin (NEURONTIN) 300 MG capsule Take 1 capsule (300 mg total) by mouth at bedtime. 90 capsule 3  .  mirtazapine (REMERON) 15 MG tablet Take 1 tablet (15 mg total) by mouth at bedtime. 30 tablet 0  . mupirocin ointment (BACTROBAN) 2 % Apply twice daily to affected area for  7 to 10 days, then as needed 30 g 0  . pantoprazole (PROTONIX) 40 MG tablet Take 1 tablet (40 mg total) by mouth daily. 145 tablet 0  . phentermine (ADIPEX-P) 37.5 MG tablet Take one half tablet every morning before  breakfast 15 tablet 3  . potassium chloride SA (KLOR-CON) 20 MEQ tablet Take 1 tablet (20 mEq total) by mouth daily. 90 tablet 3   No current facility-administered medications for this visit.     Musculoskeletal: Strength & Muscle Tone: unable to assess due to telemed visit Gait & Station: unable to assess due to telemed visit Patient leans: unable to assess due to telemed visit  Psychiatric Specialty Exam: Review of Systems  There were no vitals taken for this visit.There is no height or weight on file to calculate BMI.  General Appearance: Well Groomed  Eye Contact:  Good  Speech:  Clear and Coherent and Normal Rate  Volume:  Normal  Mood: Less Anxious and Depressed  Affect:  Congruent  Thought Process:  Goal Directed, Linear and Descriptions of Associations: Intact  Orientation:  Full (Time, Place, and Person)  Thought Content: Logical   Suicidal Thoughts:  No  Homicidal Thoughts:  No  Memory:  Recent;   Fair Remote;   Good  Judgement:  Fair  Insight:  Fair  Psychomotor Activity:  Normal  Concentration:  Concentration: Good and Attention Span: Fair  Recall:  Good  Fund of Knowledge: Good  Language: Good  Akathisia:  Negative  Handed:  Right  AIMS (if indicated): not done  Assets:  Communication Skills Desire for Improvement Financial Resources/Insurance Housing Social Support  ADL's:  Intact  Cognition: WNL  Sleep:  Improved with help of mirtazapine and Xanax in the evening   Screenings: GAD-7     Office Visit from 03/17/2019 in [facility] from 10/09/2018 in  [facility] Phone Follow Up from 08/04/2018 in [facility] Phone Follow Up from 07/16/2018 in [facility] Office Visit from 07/09/2018 in [facility]  Total GAD-7 Score  5  19  12  12  15     PHQ2-9     Office Visit from 07/15/2019 in [facility] Office Visit from 06/22/2019 in [facility] Office Visit from 10/26/2018 in [facility] from 10/09/2018 in [facility] Phone Follow Up from 08/04/2018 in [facility]  PHQ-2 Total Score  0  0  6  6  6   PHQ-9 Total Score  --  --  17  20  15        Assessment and Plan: Patient reported some relief of depressive and anxiety symptoms with her current combination of mirtazapine and Xanax in the evening.  She reported that she is resting better.  She is hoping that she and her daughter can put everything that have been behind them and move on in life.  1. MDD (major depressive disorder), recurrent, in partial remission ([facility])  - Continue mirtazapine (REMERON) 15 MG tablet; Take 1 tablet (15 mg total) by mouth at bedtime.  Dispense: 90 tablet; Refill: 0  2. Anxiety  - mirtazapine (REMERON) 15 MG tablet; Take 1 tablet (15 mg total) by mouth at bedtime.  Dispense: 90 tablet; Refill: 0 - Increase ALPRAZolam (XANAX) 1 MG tablet; Take 1 tablet (1 mg total) by mouth every evening.  Dispense: 30 tablet; Refill: 1    Supportive psychotherapy provided. F/up in 6 weeks.  Nevada Crane, MD 12/31/2019, 9:26 AM

## 2020-01-23 ENCOUNTER — Other Ambulatory Visit: Payer: Self-pay | Admitting: Family Medicine

## 2020-01-24 MED ORDER — MUPIROCIN 2 % EX OINT: TOPICAL_OINTMENT | CUTANEOUS | 0 refills | Status: DC

## 2020-02-07 ENCOUNTER — Telehealth (HOSPITAL_COMMUNITY): Payer: Self-pay | Admitting: *Deleted

## 2020-02-07 NOTE — Telephone Encounter (Signed)
PATIENT CALLED WITH NO MENTION OF REASON WHY SHE IS REQUESTING TO SPEAK WITH BEFORE HER Friday  02/11/20  8:30 AM APPT. OTHER THAN SHE NEEDS TO SPEAK WITH YOU BEFORE THAN

## 2020-02-08 ENCOUNTER — Telehealth (HOSPITAL_COMMUNITY): Payer: Self-pay | Admitting: *Deleted

## 2020-02-08 ENCOUNTER — Ambulatory Visit (INDEPENDENT_AMBULATORY_CARE_PROVIDER_SITE_OTHER): Payer: 59 | Admitting: Psychiatry

## 2020-02-08 ENCOUNTER — Other Ambulatory Visit: Payer: Self-pay

## 2020-02-08 ENCOUNTER — Encounter: Payer: Self-pay | Admitting: Psychiatry

## 2020-02-08 DIAGNOSIS — F419 Anxiety disorder, unspecified: Secondary | ICD-10-CM | POA: Diagnosis not present

## 2020-02-08 DIAGNOSIS — F3341 Major depressive disorder, recurrent, in partial remission: Secondary | ICD-10-CM | POA: Diagnosis not present

## 2020-02-08 MED ORDER — ESCITALOPRAM OXALATE 10 MG PO TABS: 10.0000 mg | ORAL_TABLET | Freq: Every day | ORAL | 0 refills | Status: DC

## 2020-02-08 MED ORDER — ALPRAZOLAM 1 MG PO TABS: 1.0000 mg | ORAL_TABLET | Freq: Every evening | ORAL | 1 refills | Status: DC

## 2020-02-08 NOTE — Telephone Encounter (Signed)
If pt wants to be seen sooner then offer appt at 11 today. I have availability at 11 today.

## 2020-02-08 NOTE — Progress Notes (Signed)
[facility] MD OP Progress Note  I connected with  [name] on 02/08/20 by a video enabled telemedicine application and verified that I am speaking with the correct person using two identifiers.   I discussed the limitations of evaluation and management by telemedicine. The patient expressed understanding and agreed to proceed.    02/08/2020 11:18 AM Olivia Woodward  MRN:  [id number]  Chief Complaint:  " I am not doing well."  HPI: Patient reported that she is not doing well at all.  She stated that she is undergoing a lot of depressive symptoms including low energy levels, poor appetite, poor concentration, anhedonia, tearfulness.  She is feeling very irritable these days and cannot do her job properly.  She stated that she cannot focus and gets annoyed easily at the customers.  She stated that she has a lot on her plate.  She is really worried and stressed about her daughter and everything that has happened to her recently.  She stated that she has a lot on her mind and she is finding it very difficult for her to sustain her work routine.  She stated that she needs time to think and time to be there for her daughter and her other family members.  She asked if she could go on short-term disability.  She informed that she was on FMLA for 4 months last year, forms filled out by [name].  She had gone back to work thinking that she was ready however she was not. She was agreeable to taking a leave of 8 weeks to clear her mind at this time.  Patient was advised to provide the required paperwork to the [facility] office. Regarding medications, past records from EMR showed that she was doing better on Lexapro a few years ago when she was being treated by other psychiatrist [name]. Patient stated that she did well on combination of Lexapro and Xanax.  Writer asked if she would like to go back to the same combination as mirtazapine has not been very helpful in her case.  Patient agreed with this  plan. She informed that she has been attending family therapy counseling sessions with her daughter.  Visit Diagnosis:    ICD-10-CM   1. MDD (major depressive disorder), recurrent, in partial remission ([place])  F33.41   2. Anxiety  F41.9     Past Psychiatric History: Depression, anxiety  Past Medical History:  Past Medical History:  Diagnosis Date  . Anemia   . Generalized headaches   . Hypertension     Past Surgical History:  Procedure Laterality Date  . ABDOMINAL HYSTERECTOMY     fibroids, partial  . BREAST REDUCTION SURGERY  2005  . COLONOSCOPY WITH PROPOFOL N/A 05/10/2019   Procedure: COLONOSCOPY WITH PROPOFOL;  Surgeon: Daneil Dolin, MD;  [facility];  Service: Endoscopy;  Laterality: N/A;  2:45pm  . GASTRIC BYPASS  2007  . POLYPECTOMY  05/10/2019   Procedure: POLYPECTOMY;  Surgeon: Daneil Dolin, MD;  [facility];  Service: Endoscopy;;  colon    Family Psychiatric History: See Below  Family History:  Family History  Problem Relation Age of Onset  . Hypertension Mother   . Diabetes Mother   . Drug abuse Mother   . Hypertension Father   . Hypertension Sister   . Hypertension Sister   . Colon cancer Neg Hx     Social History:  Social History   Socioeconomic History  . Marital status: Single  Spouse name: Not on file  . Number of children: Not on file  . Years of education: Not on file  . Highest education level: Not on file  Occupational History  . Not on file  Tobacco Use  . Smoking status: Never Smoker  . Smokeless tobacco: Never Used  Substance and Sexual Activity  . Alcohol use: No  . Drug use: No  . Sexual activity: Yes    Birth control/protection: Condom  Other Topics Concern  . Not on file  Social History Narrative  . Not on file   Social Determinants of Health   Financial Resource Strain:   . Difficulty of Paying Living Expenses:   Food Insecurity:   . Worried About Charity fundraiser in the Last Year:    . Arboriculturist in the Last Year:   Transportation Needs:   . Film/video editor (Medical):   Marland Kitchen Lack of Transportation (Non-Medical):   Physical Activity:   . Days of Exercise per Week:   . Minutes of Exercise per Session:   Stress:   . Feeling of Stress :   Social Connections:   . Frequency of Communication with Friends and Family:   . Frequency of Social Gatherings with Friends and Family:   . Attends Religious Services:   . Active Member of Clubs or Organizations:   . Attends Archivist Meetings:   Marland Kitchen Marital Status:     Allergies:  Allergies  Allergen Reactions  . Ketorolac Tromethamine Hives    Lips swelled    Metabolic Disorder Labs: Lab Results  Component Value Date   HGBA1C 5.3 07/26/2010   No results found for: PROLACTIN Lab Results  Component Value Date   CHOL 166 12/27/2017   TRIG 50 12/27/2017   HDL 83 12/27/2017   CHOLHDL 2.0 12/27/2017   VLDL 10 01/02/2017   LDLCALC 71 12/27/2017   LDLCALC 74 01/02/2017   Lab Results  Component Value Date   TSH 0.375 11/19/2018   TSH 0.339 (L) 10/26/2018    Therapeutic Level Labs: No results found for: LITHIUM No results found for: VALPROATE No components found for:  CBMZ  Current Medications: Current Outpatient Medications  Medication Sig Dispense Refill  . ALPRAZolam (XANAX) 1 MG tablet Take 1 tablet (1 mg total) by mouth every evening. 30 tablet 1  . amLODipine (NORVASC) 5 MG tablet Take 1 tablet (5 mg total) by mouth daily. 90 tablet 3  . Cyanocobalamin (B-12) 2500 MCG TABS Take 1 tablet by mouth daily.    . cyclobenzaprine (FLEXERIL) 10 MG tablet Take one tablet by mouht atr bedtime foir uncontrolled back and shoulder pain and spasm 30 tablet 1  . ergocalciferol (VITAMIN D2) 1.25 MG (50000 UT) capsule Take 1 capsule (50,000 Units total) by mouth once a week. One capsule once weekly 12 capsule 3  . furosemide (LASIX) 20 MG tablet Take 1 tablet (20 mg total) by mouth daily. 90 tablet 3  .  gabapentin (NEURONTIN) 300 MG capsule Take 1 capsule (300 mg total) by mouth at bedtime. 90 capsule 3  . mirtazapine (REMERON) 15 MG tablet Take 1 tablet (15 mg total) by mouth at bedtime. 90 tablet 0  . mupirocin ointment (BACTROBAN) 2 % Apply twice daily to affected area for  7 to 10 days, then as needed 30 g 0  . pantoprazole (PROTONIX) 40 MG tablet Take 1 tablet (40 mg total) by mouth daily. 145 tablet 0  . phentermine (ADIPEX-P) 37.5 MG tablet  Take one half tablet every morning before breakfast 15 tablet 3  . potassium chloride SA (KLOR-CON) 20 MEQ tablet Take 1 tablet (20 mEq total) by mouth daily. 90 tablet 3   No current facility-administered medications for this visit.     Musculoskeletal: Strength & Muscle Tone: unable to assess due to telemed visit Gait & Station: unable to assess due to telemed visit Patient leans: unable to assess due to telemed visit  Psychiatric Specialty Exam: Review of Systems  There were no vitals taken for this visit.There is no height or weight on file to calculate BMI.  General Appearance: Well Groomed  Eye Contact:  Good  Speech:  Clear and Coherent and Normal Rate  Volume:  Normal  Mood: Less Anxious and Depressed  Affect:  Congruent  Thought Process:  Goal Directed, Linear and Descriptions of Associations: Intact  Orientation:  Full (Time, Place, and Person)  Thought Content: Logical   Suicidal Thoughts:  No  Homicidal Thoughts:  No  Memory:  Recent;   Fair Remote;   Good  Judgement:  Fair  Insight:  Fair  Psychomotor Activity:  Normal  Concentration:  Concentration: Good and Attention Span: Fair  Recall:  Good  Fund of Knowledge: Good  Language: Good  Akathisia:  Negative  Handed:  Right  AIMS (if indicated): not done  Assets:  Communication Skills Desire for Improvement Financial Resources/Insurance Housing Social Support  ADL's:  Intact  Cognition: WNL  Sleep:  Improved with help of Xanax in the evening    Screenings: GAD-7     Office Visit from 03/17/2019 in [facility] from 10/09/2018 in [facility] Phone Follow Up from 08/04/2018 in [facility] Phone Follow Up from 07/16/2018 in [facility] Office Visit from 07/09/2018 in [facility]  Total GAD-7 Score  5  19  12  12  15     PHQ2-9     Office Visit from 07/15/2019 in [facility] Office Visit from 06/22/2019 in [facility] Office Visit from 10/26/2018 in [facility] from 10/09/2018 in [facility] Phone Follow Up from 08/04/2018 in [facility]  PHQ-2 Total Score  0  0  6  6  6   PHQ-9 Total Score  -  -  17  20  15        Assessment and Plan: Patient has only shown partial response to the combination of mirtazapine and Xanax.  She is now planning to take short-term disability as she does not feel she can function well at work right now.  She was agreeable to going back to the combination that worked for her better in the past which is Lexapro and Xanax.  1. MDD (major depressive disorder), recurrent, in partial remission ([place]) - Discontinue Mirtazapine due to lack of full response. - Start escitalopram (LEXAPRO) 10 MG tablet; Take 1 tablet (10 mg total) by mouth daily.  Dispense: 90 tablet; Refill: 0  2. Anxiety  - ALPRAZolam (XANAX) 1 MG tablet; Take 1 tablet (1 mg total) by mouth every evening.  Dispense: 30 tablet; Refill: 1 - Start escitalopram (LEXAPRO) 10 MG tablet; Take 1 tablet (10 mg total) by mouth daily.  Dispense: 90 tablet; Refill: 0   Supportive psychotherapy provided. Patient will provide the office with FMLA paperwork to be filled out. F/up in 4 weeks.  Nev[place] Crane, MD 02/08/2020, 11:18 AM

## 2020-02-08 NOTE — Telephone Encounter (Signed)
SPOKE WITH PATIENT & INFORMED PER PROVIDER: If pt wants to be seen sooner then offer appt at 11 today. I have availability at 11 today.  PATIENT ACCEPTED EARLIER APPT  & HAS BEEN ADDED TO YOU R SCHEDULE FOR 11 AM TO DAY

## 2020-02-09 NOTE — Telephone Encounter (Signed)
Message sent to provider patient requested to speak with didn't share anymore information

## 2020-02-11 ENCOUNTER — Ambulatory Visit: Payer: 59 | Admitting: Psychiatry

## 2020-03-06 ENCOUNTER — Ambulatory Visit: Payer: Managed Care, Other (non HMO) | Admitting: Family Medicine

## 2020-03-09 ENCOUNTER — Telehealth (INDEPENDENT_AMBULATORY_CARE_PROVIDER_SITE_OTHER): Payer: 59 | Admitting: Psychiatry

## 2020-03-09 ENCOUNTER — Other Ambulatory Visit: Payer: Self-pay

## 2020-03-09 ENCOUNTER — Encounter: Payer: Self-pay | Admitting: Psychiatry

## 2020-03-09 DIAGNOSIS — F331 Major depressive disorder, recurrent, moderate: Secondary | ICD-10-CM

## 2020-03-09 DIAGNOSIS — F419 Anxiety disorder, unspecified: Secondary | ICD-10-CM

## 2020-03-09 NOTE — Progress Notes (Signed)
[facility] MD OP Progress Note  I connected with  Jerry Akram Zook on 03/09/20 by a video enabled telemedicine application and verified that I am speaking with the correct person using two identifiers.   I discussed the limitations of evaluation and management by telemedicine. The patient expressed understanding and agreed to proceed.    03/09/2020 11:43 AM Olivia Woodward  MRN:  [id number]  Chief Complaint:  " I am not doing well."  HPI: Patient reported that she has been under immense pressure and stress due to situation with her daughter.  She stated that her daughter is not doing well at all.  She is depressed and irritable all the time.  She has expressed suicidal ideations.  She has told the patient that she hates her and has blamed her for everything that has happened to her.  Patient feels very upset and heartbroken.  She is feeling very guilty at the same time.  She wants her daughter to understand that she loves her however her daughter is now not being cordial as she used to be. Patient stated that ever since her daughter mentioned suicidal ideations her daughter therapist advised that the patient does not take her sleeping pill until the daughter goes to bed and as result the patient is only getting barely 3 to 4 hours of sleep at night.  Patient stated that she feels exhausted mentally and physically.  She has began to have heartburn and her blood pressure is fluctuating.  She also has started to experience frequent dizziness spells. She Stated that she is in no shape to return back to work and will request to extend her FMLA.  Writer advised the mother that if her daughter is verbalizing suicidal ideations then she should either take the patient immediately to the nearest emergency room and get psychiatric evaluation for her or schedule an earliest available appointment with a child psychiatrist. Patient stated that she is not too keen to take her daughter to the hospital as she is worried that  this may show up in her record at a later stage.  She is also worried that her daughter made her even more.  She also informed that now her daughter has started talking to her father although he was not involved in her life for the past few years.  Writer strongly advised the mother to get urgent help for her daughter so that her daughter can feel better and this would also help the patient to feel better.  Patient verbalized understanding.   Visit Diagnosis:    ICD-10-CM   1. MDD (major depressive disorder), recurrent episode, moderate (HCC)  F33.1   2. Anxiety  F41.9     Past Psychiatric History: Depression, anxiety  Past Medical History:  Past Medical History:  Diagnosis Date  . Anemia   . Generalized headaches   . Hypertension     Past Surgical History:  Procedure Laterality Date  . ABDOMINAL HYSTERECTOMY     fibroids, partial  . BREAST REDUCTION SURGERY  2005  . COLONOSCOPY WITH PROPOFOL N/A 05/10/2019   Procedure: COLONOSCOPY WITH PROPOFOL;  Surgeon: Daneil Dolin, MD;  [facility];  Service: Endoscopy;  Laterality: N/A;  2:45pm  . GASTRIC BYPASS  2007  . POLYPECTOMY  05/10/2019   Procedure: POLYPECTOMY;  Surgeon: Daneil Dolin, MD;  [facility];  Service: Endoscopy;;  colon    Family Psychiatric History: See Below  Family History:  Family History  Problem Relation Age of  Onset  . Hypertension Mother   . Diabetes Mother   . Drug abuse Mother   . Hypertension Father   . Hypertension Sister   . Hypertension Sister   . Colon cancer Neg Hx     Social History:  Social History   Socioeconomic History  . Marital status: Single    Spouse name: Not on file  . Number of children: Not on file  . Years of education: Not on file  . Highest education level: Not on file  Occupational History  . Not on file  Tobacco Use  . Smoking status: Never Smoker  . Smokeless tobacco: Never Used  Substance and Sexual Activity  . Alcohol use: No  .  Drug use: No  . Sexual activity: Yes    Birth control/protection: Condom  Other Topics Concern  . Not on file  Social History Narrative  . Not on file   Social Determinants of Health   Financial Resource Strain:   . Difficulty of Paying Living Expenses:   Food Insecurity:   . Worried About Charity fundraiser in the Last Year:   . Arboriculturist in the Last Year:   Transportation Needs:   . Film/video editor (Medical):   Marland Kitchen Lack of Transportation (Non-Medical):   Physical Activity:   . Days of Exercise per Week:   . Minutes of Exercise per Session:   Stress:   . Feeling of Stress :   Social Connections:   . Frequency of Communication with Friends and Family:   . Frequency of Social Gatherings with Friends and Family:   . Attends Religious Services:   . Active Member of Clubs or Organizations:   . Attends Archivist Meetings:   Marland Kitchen Marital Status:     Allergies:  Allergies  Allergen Reactions  . Ketorolac Tromethamine Hives    Lips swelled    Metabolic Disorder Labs: Lab Results  Component Value Date   HGBA1C 5.3 07/26/2010   No results found for: PROLACTIN Lab Results  Component Value Date   CHOL 166 12/27/2017   TRIG 50 12/27/2017   HDL 83 12/27/2017   CHOLHDL 2.0 12/27/2017   VLDL 10 01/02/2017   LDLCALC 71 12/27/2017   LDLCALC 74 01/02/2017   Lab Results  Component Value Date   TSH 0.375 11/19/2018   TSH 0.339 (L) 10/26/2018    Therapeutic Level Labs: No results found for: LITHIUM No results found for: VALPROATE No components found for:  CBMZ  Current Medications: Current Outpatient Medications  Medication Sig Dispense Refill  . ALPRAZolam (XANAX) 1 MG tablet Take 1 tablet (1 mg total) by mouth every evening. 30 tablet 1  . amLODipine (NORVASC) 5 MG tablet Take 1 tablet (5 mg total) by mouth daily. 90 tablet 3  . Cyanocobalamin (B-12) 2500 MCG TABS Take 1 tablet by mouth daily.    . cyclobenzaprine (FLEXERIL) 10 MG tablet Take  one tablet by mouht atr bedtime foir uncontrolled back and shoulder pain and spasm 30 tablet 1  . ergocalciferol (VITAMIN D2) 1.25 MG (50000 UT) capsule Take 1 capsule (50,000 Units total) by mouth once a week. One capsule once weekly 12 capsule 3  . escitalopram (LEXAPRO) 10 MG tablet Take 1 tablet (10 mg total) by mouth daily. 90 tablet 0  . furosemide (LASIX) 20 MG tablet Take 1 tablet (20 mg total) by mouth daily. 90 tablet 3  . gabapentin (NEURONTIN) 300 MG capsule Take 1 capsule (300 mg total)  by mouth at bedtime. 90 capsule 3  . mupirocin ointment (BACTROBAN) 2 % Apply twice daily to affected area for  7 to 10 days, then as needed 30 g 0  . pantoprazole (PROTONIX) 40 MG tablet Take 1 tablet (40 mg total) by mouth daily. 145 tablet 0  . phentermine (ADIPEX-P) 37.5 MG tablet Take one half tablet every morning before breakfast 15 tablet 3  . potassium chloride SA (KLOR-CON) 20 MEQ tablet Take 1 tablet (20 mEq total) by mouth daily. 90 tablet 3   No current facility-administered medications for this visit.     Musculoskeletal: Strength & Muscle Tone: unable to assess due to telemed visit Gait & Station: unable to assess due to telemed visit Patient leans: unable to assess due to telemed visit  Psychiatric Specialty Exam: Review of Systems  There were no vitals taken for this visit.There is no height or weight on file to calculate BMI.  General Appearance: Well Groomed  Eye Contact:  Good  Speech:  Clear and Coherent and Normal Rate  Volume:  Normal  Mood: Less Anxious and Depressed  Affect:  Congruent, tearful  Thought Process:  Goal Directed, Linear and Descriptions of Associations: Intact  Orientation:  Full (Time, Place, and Person)  Thought Content: Logical   Suicidal Thoughts:  No  Homicidal Thoughts:  No  Memory:  Recent;   Fair Remote;   Good  Judgement:  Fair  Insight:  Fair  Psychomotor Activity:  Normal  Concentration:  Concentration: Good and Attention Span: Fair   Recall:  Good  Fund of Knowledge: Good  Language: Good  Akathisia:  Negative  Handed:  Right  AIMS (if indicated): not done  Assets:  Communication Skills Desire for Improvement Financial Resources/Insurance Housing Social Support  ADL's:  Intact  Cognition: WNL  Sleep:  Poor as is not taking xanax at night to keep an eye on her daughter   Screenings: GAD-7     Office Visit from 03/17/2019 in [facility] from 10/09/2018 in [facility] Phone Follow Up from 08/04/2018 in [facility] Phone Follow Up from 07/16/2018 in [facility] Office Visit from 07/09/2018 in [facility]  Total GAD-7 Score  5  19  12  12  15     PHQ2-9     Office Visit from 07/15/2019 in [facility] Office Visit from 06/22/2019 in [facility] Visit from 10/26/2018 in [facility] from 10/09/2018 in [facility] Phone Follow Up from 08/04/2018 in [facility]  PHQ-2 Total Score  0  0  6  6  6   PHQ-9 Total Score  --  --  17  20  15        Assessment and Plan: Patient is undergoing extreme stress due to everything that is going on with her daughter.  Writer advised that the patient seek urgent help for her daughter by either taking her to the nearest emergency room for psychiatric evaluation or makes an early available appointment for child psychiatrist.  Patient verbalized understanding.  We will continue same regimen for now.  1. MDD (major depressive disorder), recurrent, in partial remission (HCC) - Continue escitalopram (LEXAPRO) 10 MG tablet; Take 1 tablet (10 mg total) by mouth daily.  Dispense: 90 tablet; Refill: 0  2. Anxiety  - ALPRAZolam (XANAX) 1 MG tablet; Take 1 tablet (1 mg total) by mouth every evening.  Dispense:  30 tablet; Refill: 1 - escitalopram (LEXAPRO) 10 MG tablet;  Take 1 tablet (10 mg total) by mouth daily.  Dispense: 90 tablet; Refill: 0   Supportive psychotherapy provided.  F/up in 4 weeks.  Nev[place] Crane, MD 03/09/2020, 11:43 AM

## 2020-04-07 ENCOUNTER — Other Ambulatory Visit: Payer: Self-pay

## 2020-04-07 ENCOUNTER — Encounter: Payer: Self-pay | Admitting: Psychiatry

## 2020-04-07 ENCOUNTER — Telehealth (INDEPENDENT_AMBULATORY_CARE_PROVIDER_SITE_OTHER): Payer: 59 | Admitting: Psychiatry

## 2020-04-07 DIAGNOSIS — F3341 Major depressive disorder, recurrent, in partial remission: Secondary | ICD-10-CM

## 2020-04-07 DIAGNOSIS — F419 Anxiety disorder, unspecified: Secondary | ICD-10-CM | POA: Diagnosis not present

## 2020-04-07 MED ORDER — ALPRAZOLAM 0.25 MG PO TABS: 0.2500 mg | ORAL_TABLET | Freq: Two times a day (BID) | ORAL | 1 refills | Status: DC | PRN

## 2020-04-07 MED ORDER — ALPRAZOLAM 1 MG PO TABS: 1.0000 mg | ORAL_TABLET | Freq: Every evening | ORAL | 1 refills | Status: DC

## 2020-04-07 MED ORDER — ESCITALOPRAM OXALATE 20 MG PO TABS: 20.0000 mg | ORAL_TABLET | Freq: Every day | ORAL | 1 refills | Status: DC

## 2020-04-07 NOTE — Progress Notes (Addendum)
[facility] MD OP Progress Note  Virtual Visit via Video Note  I connected with Olivia Woodward on 04/07/20 at  9:00 AM EDT by a video enabled telemedicine application and verified that I am speaking with the correct person using two identifiers.  Location: Patient: Home Provider: Clinic   I discussed the limitations of evaluation and management by telemedicine and the availability of in person appointments. The patient expressed understanding and agreed to proceed.  I provided 19 minutes of non-face-to-face time during this encounter.  The session was started as a video visit but had to be changed to telephone visit as pt had to drive somewhere urgently.   04/07/2020 8:44 AM Olivia Woodward  MRN:  [id number]  Chief Complaint:  " I am having panic attacks left and right, I'm just tired and I need a break."  HPI:  Patient reports that she has been experiencing frequent panic attacks related to stress about her daughter's medical condition.  Patient reports that due to her stress, she has been forgetful at times and she feels as though she needs something to ease her mind.  Provider offered to start patient on  low dose Xanax 0.25 mg as needed during the daytime to reduce her anxiety and frequency of panic attacks.  Provider discussed medication with patient and reassured patient that this low dose should not make her drowsy during the day.  Patient reports that she has not been taking her bedtime Xanax dose in efforts to keep an eye on her daughter. Patient is agreeable to this medication adjustment at this time.  Pt informed that her daughter had been recently hospitalized to adolescent in-pt unit [facility] a few weeks ago. She informed her daughter was not cooperative with treatment team during her hospital stay. She refused to take the prescribed medication. Patient reports that lately she has been feeling overwhelmed with stress. She feels as though her family is not supportive of her during this  difficult time.  She reports that they are judgmental of her and her daughter receiving psychiatric treatment for their mental health conditions and seeking medical care for her daughter's medical condition. Her daughter has been blaming her for all the things she is going through. Pt stated that she feels she has lost herself and her daughter over the last few months. Patient reports that she is considering family therapy to help her family cope with their current stressors, but she is unsure if her family will be open-minded to therapy at this time.  Provider discussed increasing her current Lexapro dose to 20 mg daily to help improve her mood.  Patient is agreeable to this plan and medication adjustment at this time.    Visit Diagnosis:    ICD-10-CM   1. MDD (major depressive disorder), recurrent, in partial remission ([place])  F33.41   2. Anxiety  F41.9     Past Psychiatric History: Depression, anxiety  Past Medical History:  Past Medical History:  Diagnosis Date  . Anemia   . Generalized headaches   . Hypertension     Past Surgical History:  Procedure Laterality Date  . ABDOMINAL HYSTERECTOMY     fibroids, partial  . BREAST REDUCTION SURGERY  2005  . COLONOSCOPY WITH PROPOFOL N/A 05/10/2019   Procedure: COLONOSCOPY WITH PROPOFOL;  Surgeon: Daneil Dolin, MD;  [facility];  Service: Endoscopy;  Laterality: N/A;  2:45pm  . GASTRIC BYPASS  2007  . POLYPECTOMY  05/10/2019   Procedure: POLYPECTOMY;  Surgeon: Daneil Dolin, MD;  [facility];  Service: Endoscopy;;  colon    Family Psychiatric History: See Below  Family History:  Family History  Problem Relation Age of Onset  . Hypertension Mother   . Diabetes Mother   . Drug abuse Mother   . Hypertension Father   . Hypertension Sister   . Hypertension Sister   . Colon cancer Neg Hx     Social History:  Social History   Socioeconomic History  . Marital status: Single    Spouse name: Not on file   . Number of children: Not on file  . Years of education: Not on file  . Highest education level: Not on file  Occupational History  . Not on file  Tobacco Use  . Smoking status: Never Smoker  . Smokeless tobacco: Never Used  Substance and Sexual Activity  . Alcohol use: No  . Drug use: No  . Sexual activity: Yes    Birth control/protection: Condom  Other Topics Concern  . Not on file  Social History Narrative  . Not on file   Social Determinants of Health   Financial Resource Strain:   . Difficulty of Paying Living Expenses:   Food Insecurity:   . Worried About Charity fundraiser in the Last Year:   . Arboriculturist in the Last Year:   Transportation Needs:   . Film/video editor (Medical):   Marland Kitchen Lack of Transportation (Non-Medical):   Physical Activity:   . Days of Exercise per Week:   . Minutes of Exercise per Session:   Stress:   . Feeling of Stress :   Social Connections:   . Frequency of Communication with Friends and Family:   . Frequency of Social Gatherings with Friends and Family:   . Attends Religious Services:   . Active Member of Clubs or Organizations:   . Attends Archivist Meetings:   Marland Kitchen Marital Status:     Allergies:  Allergies  Allergen Reactions  . Ketorolac Tromethamine Hives    Lips swelled    Metabolic Disorder Labs: Lab Results  Component Value Date   HGBA1C 5.3 07/26/2010   No results found for: PROLACTIN Lab Results  Component Value Date   CHOL 166 12/27/2017   TRIG 50 12/27/2017   HDL 83 12/27/2017   CHOLHDL 2.0 12/27/2017   VLDL 10 01/02/2017   LDLCALC 71 12/27/2017   LDLCALC 74 01/02/2017   Lab Results  Component Value Date   TSH 0.375 11/19/2018   TSH 0.339 (L) 10/26/2018    Therapeutic Level Labs: No results found for: LITHIUM No results found for: VALPROATE No components found for:  CBMZ  Current Medications: Current Outpatient Medications  Medication Sig Dispense Refill  . ALPRAZolam (XANAX) 1  MG tablet Take 1 tablet (1 mg total) by mouth every evening. 30 tablet 1  . amLODipine (NORVASC) 5 MG tablet Take 1 tablet (5 mg total) by mouth daily. 90 tablet 3  . Cyanocobalamin (B-12) 2500 MCG TABS Take 1 tablet by mouth daily.    . cyclobenzaprine (FLEXERIL) 10 MG tablet Take one tablet by mouht atr bedtime foir uncontrolled back and shoulder pain and spasm 30 tablet 1  . ergocalciferol (VITAMIN D2) 1.25 MG (50000 UT) capsule Take 1 capsule (50,000 Units total) by mouth once a week. One capsule once weekly 12 capsule 3  . escitalopram (LEXAPRO) 10 MG tablet Take 1 tablet (10 mg total) by mouth daily. [place]  tablet 0  . furosemide (LASIX) 20 MG tablet Take 1 tablet (20 mg total) by mouth daily. 90 tablet 3  . gabapentin (NEURONTIN) 300 MG capsule Take 1 capsule (300 mg total) by mouth at bedtime. 90 capsule 3  . mupirocin ointment (BACTROBAN) 2 % Apply twice daily to affected area for  7 to 10 days, then as needed 30 g 0  . pantoprazole (PROTONIX) 40 MG tablet Take 1 tablet (40 mg total) by mouth daily. 145 tablet 0  . phentermine (ADIPEX-P) 37.5 MG tablet Take one half tablet every morning before breakfast 15 tablet 3  . potassium chloride SA (KLOR-CON) 20 MEQ tablet Take 1 tablet (20 mEq total) by mouth daily. 90 tablet 3   No current facility-administered medications for this visit.     Musculoskeletal: Strength & Muscle Tone: unable to assess due to telemed visit Gait & Station: unable to assess due to telemed visit Patient leans: unable to assess due to telemed visit  Psychiatric Specialty Exam: Review of Systems  There were no vitals taken for this visit.There is no height or weight on file to calculate BMI.  General Appearance: Well Groomed  Eye Contact:  Good  Speech:  Clear and Coherent and Normal Rate  Volume:  Normal  Mood: Less Anxious and Depressed  Affect:  Congruent  Thought Process:  Goal Directed, Linear and Descriptions of Associations: Intact  Orientation:  Full  (Time, Place, and Person)  Thought Content: Logical   Suicidal Thoughts:  No  Homicidal Thoughts:  No  Memory:  Recent;   Fair Remote;   Good  Judgement:  Fair  Insight:  Fair  Psychomotor Activity:  Normal  Concentration:  Concentration: Good and Attention Span: Fair  Recall:  Good  Fund of Knowledge: Good  Language: Good  Akathisia:  Unable to assess, virtual visit  Handed:  Right  AIMS (if indicated): not done  Assets:  Communication Skills Desire for Improvement Financial Resources/Insurance Housing Social Support  ADL's:  Intact  Cognition: WNL  Sleep:  Poor as is not taking xanax at night to keep an eye on her daughter   Screenings: GAD-7     Office Visit from 03/17/2019 in [facility] from 10/09/2018 in [facility] Phone Follow Up from 08/04/2018 in [facility] Phone Follow Up from 07/16/2018 in [facility] Office Visit from 07/09/2018 in [facility]  Total GAD-7 Score  5  19  12  12  15     PHQ2-9     Office Visit from 07/15/2019 in [facility] Office Visit from 06/22/2019 in [facility] Visit from 10/26/2018 in [facility] from 10/09/2018 in [facility] Phone Follow Up from 08/04/2018 in [facility]  PHQ-2 Total Score  0  0  6  6  6   PHQ-9 Total Score  --  --  17  20  15        Assessment and Plan: Patient reports frequent panic attacks and depressed mood related to family conflict and stressors. Patient will start low-dose Xanax 0.25 mg BID PRN to help with anxiety and frequency of panic attacks.   Lexapro 10 mg will be increased to 20 mg once daily optimal effect.  Patient is agreeable to this plan of care and medication adjustments at this time.  Patient reports that she plans to seek family therapy in the future to cope with  family-related  stressors.      1. MDD (major depressive disorder), recurrent, in partial remission (HCC)  - Increase escitalopram (LEXAPRO) 20 MG tablet; Take 1 tablet (20 mg total) by mouth daily.  Dispense: 30 tablet; Refill: 1  2. Anxiety  - ALPRAZolam (XANAX) 1 MG tablet; Take 1 tablet (1 mg total) by mouth every evening.  Dispense: 30 tablet; Refill: 1 - escitalopram (LEXAPRO) 20 MG tablet; Take 1 tablet (20 mg total) by mouth daily.  Dispense: 30 tablet; Refill: 1 - ALPRAZolam (XANAX) 0.25 MG tablet; Take 1 tablet (0.25 mg total) by mouth 2 (two) times daily as needed for anxiety.  Dispense: 60 tablet; Refill: 1   Supportive psychotherapy provided. F/up in 6 weeks.  [place] Crane, MD 04/07/2020, 8:44 AM

## 2020-04-14 ENCOUNTER — Telehealth (HOSPITAL_COMMUNITY): Payer: Self-pay

## 2020-04-18 NOTE — Telephone Encounter (Signed)
We turned in her disability paperwork last Monday. [name] from [facility] office faxed it after I filled it out on last Monday.

## 2020-05-19 ENCOUNTER — Other Ambulatory Visit: Payer: Self-pay

## 2020-05-19 ENCOUNTER — Encounter (HOSPITAL_COMMUNITY): Payer: Self-pay | Admitting: Psychiatry

## 2020-05-19 ENCOUNTER — Telehealth: Payer: 59 | Admitting: Psychiatry

## 2020-05-19 ENCOUNTER — Telehealth (INDEPENDENT_AMBULATORY_CARE_PROVIDER_SITE_OTHER): Payer: 59 | Admitting: Psychiatry

## 2020-05-19 DIAGNOSIS — F419 Anxiety disorder, unspecified: Secondary | ICD-10-CM

## 2020-05-19 DIAGNOSIS — F3341 Major depressive disorder, recurrent, in partial remission: Secondary | ICD-10-CM

## 2020-05-19 MED ORDER — ESCITALOPRAM OXALATE 20 MG PO TABS: 20.0000 mg | ORAL_TABLET | Freq: Every day | ORAL | 1 refills | Status: DC

## 2020-05-19 MED ORDER — ALPRAZOLAM 1 MG PO TABS: 1.0000 mg | ORAL_TABLET | Freq: Every evening | ORAL | 1 refills | Status: DC

## 2020-05-19 MED ORDER — ALPRAZOLAM 0.25 MG PO TABS: 0.2500 mg | ORAL_TABLET | Freq: Two times a day (BID) | ORAL | 1 refills | Status: DC | PRN

## 2020-05-19 NOTE — Progress Notes (Signed)
[facility] MD OP Progress Note  Virtual Visit via Video Note  I connected with Larna Capelle Comp on 05/19/20 at  9:00 AM EDT by a video enabled telemedicine application and verified that I am speaking with the correct person using two identifiers.  Location: Patient: Dentist office Provider: Clinic   I discussed the limitations of evaluation and management by telemedicine and the availability of in person appointments. The patient expressed understanding and agreed to proceed.  I provided 16 minutes of non-face-to-face time during this encounter.  The session was started as a video visit but had to be changed to telephone visit as pt had poor internet connection.   05/19/2020 9:22 AM JISELLA ASHENFELTER  MRN:  [id number]  Chief Complaint:  " I'm doing a little better."  HPI: 54 year old patient contacted today for medication management follow-up appointment.  Patient reports that she is doing a little better today than at her last visit. She is currently in the dentis office for her appt.   She reports that she has returned to work few days ago. She stated that she is trying her best to move on. She is managing fairly well at this time. She reports that her medications are working well for her at this time and she denies any need for medication adjustments today.  Patient would like to keep her medication regimen the same at this time.    Visit Diagnosis:    ICD-10-CM   1. MDD (major depressive disorder), recurrent, in partial remission ([place])  F33.41   2. Anxiety  F41.9     Past Psychiatric History: Depression, anxiety  Past Medical History:  Past Medical History:  Diagnosis Date  . Anemia   . Generalized headaches   . Hypertension     Past Surgical History:  Procedure Laterality Date  . ABDOMINAL HYSTERECTOMY     fibroids, partial  . BREAST REDUCTION SURGERY  2005  . COLONOSCOPY WITH PROPOFOL N/A 05/10/2019   Procedure: COLONOSCOPY WITH PROPOFOL;  Surgeon: Daneil Dolin, MD;   [facility];  Service: Endoscopy;  Laterality: N/A;  2:45pm  . GASTRIC BYPASS  2007  . POLYPECTOMY  05/10/2019   Procedure: POLYPECTOMY;  Surgeon: Daneil Dolin, MD;  [facility];  Service: Endoscopy;;  colon    Family Psychiatric History: See Below  Family History:  Family History  Problem Relation Age of Onset  . Hypertension Mother   . Diabetes Mother   . Drug abuse Mother   . Hypertension Father   . Hypertension Sister   . Hypertension Sister   . Colon cancer Neg Hx     Social History:  Social History   Socioeconomic History  . Marital status: Single    Spouse name: Not on file  . Number of children: Not on file  . Years of education: Not on file  . Highest education level: Not on file  Occupational History  . Not on file  Tobacco Use  . Smoking status: Never Smoker  . Smokeless tobacco: Never Used  Substance and Sexual Activity  . Alcohol use: No  . Drug use: No  . Sexual activity: Yes    Birth control/protection: Condom  Other Topics Concern  . Not on file  Social History Narrative  . Not on file   Social Determinants of Health   Financial Resource Strain:   . Difficulty of Paying Living Expenses:   Food Insecurity:   . Worried About Charity fundraiser in the  Last Year:   . [place] in the Last Year:   Transportation Needs:   . Film/video editor (Medical):   Marland Kitchen Lack of Transportation (Non-Medical):   Physical Activity:   . Days of Exercise per Week:   . Minutes of Exercise per Session:   Stress:   . Feeling of Stress :   Social Connections:   . Frequency of Communication with Friends and Family:   . Frequency of Social Gatherings with Friends and Family:   . Attends Religious Services:   . Active Member of Clubs or Organizations:   . Attends Archivist Meetings:   Marland Kitchen Marital Status:     Allergies:  Allergies  Allergen Reactions  . Ketorolac Tromethamine Hives    Lips swelled    Metabolic  Disorder Labs: Lab Results  Component Value Date   HGBA1C 5.3 07/26/2010   No results found for: PROLACTIN Lab Results  Component Value Date   CHOL 166 12/27/2017   TRIG 50 12/27/2017   HDL 83 12/27/2017   CHOLHDL 2.0 12/27/2017   VLDL 10 01/02/2017   LDLCALC 71 12/27/2017   LDLCALC 74 01/02/2017   Lab Results  Component Value Date   TSH 0.375 11/19/2018   TSH 0.339 (L) 10/26/2018    Therapeutic Level Labs: No results found for: LITHIUM No results found for: VALPROATE No components found for:  CBMZ  Current Medications: Current Outpatient Medications  Medication Sig Dispense Refill  . ALPRAZolam (XANAX) 0.25 MG tablet Take 1 tablet (0.25 mg total) by mouth 2 (two) times daily as needed for anxiety. 60 tablet 1  . ALPRAZolam (XANAX) 1 MG tablet Take 1 tablet (1 mg total) by mouth every evening. 30 tablet 1  . amLODipine (NORVASC) 5 MG tablet Take 1 tablet (5 mg total) by mouth daily. 90 tablet 3  . Cyanocobalamin (B-12) 2500 MCG TABS Take 1 tablet by mouth daily.    . cyclobenzaprine (FLEXERIL) 10 MG tablet Take one tablet by mouht atr bedtime foir uncontrolled back and shoulder pain and spasm 30 tablet 1  . ergocalciferol (VITAMIN D2) 1.25 MG (50000 UT) capsule Take 1 capsule (50,000 Units total) by mouth once a week. One capsule once weekly 12 capsule 3  . escitalopram (LEXAPRO) 20 MG tablet Take 1 tablet (20 mg total) by mouth daily. 30 tablet 1  . furosemide (LASIX) 20 MG tablet Take 1 tablet (20 mg total) by mouth daily. 90 tablet 3  . gabapentin (NEURONTIN) 300 MG capsule Take 1 capsule (300 mg total) by mouth at bedtime. 90 capsule 3  . mupirocin ointment (BACTROBAN) 2 % Apply twice daily to affected area for  7 to 10 days, then as needed 30 g 0  . pantoprazole (PROTONIX) 40 MG tablet Take 1 tablet (40 mg total) by mouth daily. 145 tablet 0  . phentermine (ADIPEX-P) 37.5 MG tablet Take one half tablet every morning before breakfast 15 tablet 3  . potassium chloride SA  (KLOR-CON) 20 MEQ tablet Take 1 tablet (20 mEq total) by mouth daily. 90 tablet 3   No current facility-administered medications for this visit.     Musculoskeletal: Strength & Muscle Tone: unable to assess due to telemed visit Gait & Station: unable to assess due to telemed visit Patient leans: unable to assess due to telemed visit  Psychiatric Specialty Exam: Review of Systems  There were no vitals taken for this visit.There is no height or weight on file to calculate BMI.  General Appearance:  Well Groomed  Eye Contact:  Good  Speech:  Clear and Coherent and Normal Rate  Volume:  Normal  Mood: Less Euthymic  Affect:  Congruent  Thought Process:  Goal Directed and Linear  Orientation:  Full (Time, Place, and Person)  Thought Content: Logical   Suicidal Thoughts:  No  Homicidal Thoughts:  No  Memory:  Recent;   Fair Remote;   Good  Judgement:  Fair  Insight:  Fair  Psychomotor Activity:  Normal  Concentration:  Concentration: Good and Attention Span: Fair  Recall:  Good  Fund of Knowledge: Good  Language: Good  Akathisia:  Unable to assess, virtual visit  Handed:  Right  AIMS (if indicated): not done  Assets:  Communication Skills Desire for Improvement Financial Resources/Insurance Housing Social Support  ADL's:  Intact  Cognition: WNL  Sleep:  Fair   Screenings: GAD-7     Office Visit from 03/17/2019 in [facility] Counselor from 10/09/2018 in [place] [facility] [place] Phone Follow Up from 08/04/2018 in [place] [facility] Phone Follow Up from 07/16/2018 in [facility] Office Visit from 07/09/2018 in [facility]  Total GAD-7 Score 5 19 12 12 15     PHQ2-9     Office Visit from 07/15/2019 in [facility] Office Visit from 06/22/2019 in [facility] Visit from 10/26/2018 in [facility] from 10/09/2018 in [facility] Phone Follow Up from 08/04/2018 in [facility]  PHQ-2 Total Score 0 0 6 6 6   PHQ-9 Total Score -- -- 17 20 15        Assessment and Plan: Patient reports that she has noticed a little improvement since her last visit.  She has returned back to work.  Patient is agreeable to continue her current medication regimen.  1. MDD (major depressive disorder), recurrent, in partial remission (HCC)  - escitalopram (LEXAPRO) 20 MG tablet; Take 1 tablet (20 mg total) by mouth daily.  Dispense: 30 tablet; Refill: 1  2. Anxiety  - ALPRAZolam (XANAX) 0.25 MG tablet; Take 1 tablet (0.25 mg total) by mouth 2 (two) times daily as needed for anxiety.  Dispense: 60 tablet; Refill: 1 - ALPRAZolam (XANAX) 1 MG tablet; Take 1 tablet (1 mg total) by mouth every evening.  Dispense: 30 tablet; Refill: 1 - escitalopram (LEXAPRO) 20 MG tablet; Take 1 tablet (20 mg total) by mouth daily.  Dispense: 30 tablet; Refill: 1   Continue same regimen. F/up in 2 months.  Nevada Crane, MD 05/19/2020, 9:22 AM

## 2020-05-30 ENCOUNTER — Ambulatory Visit: Payer: Managed Care, Other (non HMO) | Admitting: Family Medicine

## 2020-06-01 ENCOUNTER — Ambulatory Visit: Payer: Managed Care, Other (non HMO) | Admitting: Family Medicine

## 2020-07-18 ENCOUNTER — Encounter (HOSPITAL_COMMUNITY): Payer: Self-pay | Admitting: Psychiatry

## 2020-07-18 ENCOUNTER — Other Ambulatory Visit: Payer: Self-pay

## 2020-07-18 ENCOUNTER — Telehealth: Payer: Self-pay

## 2020-07-18 ENCOUNTER — Telehealth (INDEPENDENT_AMBULATORY_CARE_PROVIDER_SITE_OTHER): Payer: PRIVATE HEALTH INSURANCE | Admitting: Psychiatry

## 2020-07-18 DIAGNOSIS — F3341 Major depressive disorder, recurrent, in partial remission: Secondary | ICD-10-CM

## 2020-07-18 DIAGNOSIS — F419 Anxiety disorder, unspecified: Secondary | ICD-10-CM

## 2020-07-18 MED ORDER — ESCITALOPRAM OXALATE 20 MG PO TABS: 20.0000 mg | ORAL_TABLET | Freq: Every day | ORAL | 1 refills | Status: DC

## 2020-07-18 MED ORDER — ALPRAZOLAM 0.25 MG PO TABS: 0.2500 mg | ORAL_TABLET | Freq: Two times a day (BID) | ORAL | 1 refills | Status: DC | PRN

## 2020-07-18 MED ORDER — ALPRAZOLAM 1 MG PO TABS: 1.0000 mg | ORAL_TABLET | Freq: Every evening | ORAL | 1 refills | Status: DC

## 2020-07-18 NOTE — Progress Notes (Signed)
[facility] MD OP Progress Note  Virtual Visit via Telephone Note  I connected with Olivia Woodward on 07/18/20 at  8:30 AM EDT by telephone and verified that I am speaking with the correct person using two identifiers.  Location: Patient: home Provider: Clinic   I discussed the limitations, risks, security and privacy concerns of performing an evaluation and management service by telephone and the availability of in person appointments. I also discussed with the patient that there may be a patient responsible charge related to this service. The patient expressed understanding and agreed to proceed.   I provided 16 minutes of non-face-to-face time during this encounter.    07/18/2020 8:37 AM Olivia Woodward  MRN:  [id number]  Chief Complaint:  " I am doing better."  HPI: Patient found that she is doing much better.  She stated that going back to work was not a bad decision after all.  It helps her take all her focus away from the stress in her life.  She also stated that she believes the medication Lexapro has helped her significantly as well.  She has not had any panic attacks in the last 3 weeks.  She stated that it has helped to take the edge off and she wants to get somebody to the medicine as well.  She stated that her daughter is doing much better compared to the past as well.  Her daughter went back to school however she had to be quarantined due to the exposure to somebody with Covid.  She stated that her daughter is focused on her studies and she feels very proud of her. She stated other than minor work-related stress she is doing overall much better and the family's doing well too.  She stated that she would like to keep things the way they are.  Visit Diagnosis:    ICD-10-CM   1. MDD (major depressive disorder), recurrent, in partial remission ([place])  F33.41   2. Anxiety  F41.9     Past Psychiatric History: Depression, anxiety  Past Medical History:  Past Medical History:   Diagnosis Date   Anemia    Generalized headaches    Hypertension     Past Surgical History:  Procedure Laterality Date   ABDOMINAL HYSTERECTOMY     fibroids, partial   BREAST REDUCTION SURGERY  2005   COLONOSCOPY WITH PROPOFOL N/A 05/10/2019   Procedure: COLONOSCOPY WITH PROPOFOL;  Surgeon: Daneil Dolin, MD;  [facility];  Service: Endoscopy;  Laterality: N/A;  2:45pm   GASTRIC BYPASS  2007   POLYPECTOMY  05/10/2019   Procedure: POLYPECTOMY;  Surgeon: Daneil Dolin, MD;  [facility];  Service: Endoscopy;;  colon    Family Psychiatric History: See Below  Family History:  Family History  Problem Relation Age of Onset   Hypertension Mother    Diabetes Mother    Drug abuse Mother    Hypertension Father    Hypertension Sister    Hypertension Sister    Colon cancer Neg Hx     Social History:  Social History   Socioeconomic History   Marital status: Single    Spouse name: Not on file   Number of children: Not on file   Years of education: Not on file   Highest education level: Not on file  Occupational History   Not on file  Tobacco Use   Smoking status: Never Smoker   Smokeless tobacco: Never Used  Substance and Sexual Activity  Alcohol use: No   Drug use: No   Sexual activity: Yes    Birth control/protection: Condom  Other Topics Concern   Not on file  Social History Narrative   Not on file   Social Determinants of Health   Financial Resource Strain:    Difficulty of Paying Living Expenses: Not on file  Food Insecurity:    Worried About Charity fundraiser in the Last Year: Not on file   [employer] of Food in the Last Year: Not on file  Transportation Needs:    Lack of Transportation (Medical): Not on file   Lack of Transportation (Non-Medical): Not on file  Physical Activity:    Days of Exercise per Week: Not on file   Minutes of Exercise per Session: Not on file  Stress:    Feeling of  Stress : Not on file  Social Connections:    Frequency of Communication with Friends and Family: Not on file   Frequency of Social Gatherings with Friends and Family: Not on file   Attends Religious Services: Not on file   Active Member of Clubs or Organizations: Not on file   Attends Archivist Meetings: Not on file   Marital Status: Not on file    Allergies:  Allergies  Allergen Reactions   Ketorolac Tromethamine Hives    Lips swelled    Metabolic Disorder Labs: Lab Results  Component Value Date   HGBA1C 5.3 07/26/2010   No results found for: PROLACTIN Lab Results  Component Value Date   CHOL 166 12/27/2017   TRIG 50 12/27/2017   HDL 83 12/27/2017   CHOLHDL 2.0 12/27/2017   VLDL 10 01/02/2017   LDLCALC 71 12/27/2017   LDLCALC 74 01/02/2017   Lab Results  Component Value Date   TSH 0.375 11/19/2018   TSH 0.339 (L) 10/26/2018    Therapeutic Level Labs: No results found for: LITHIUM No results found for: VALPROATE No components found for:  CBMZ  Current Medications: Current Outpatient Medications  Medication Sig Dispense Refill   ALPRAZolam (XANAX) 0.25 MG tablet Take 1 tablet (0.25 mg total) by mouth 2 (two) times daily as needed for anxiety. 60 tablet 1   ALPRAZolam (XANAX) 1 MG tablet Take 1 tablet (1 mg total) by mouth every evening. 30 tablet 1   amLODipine (NORVASC) 5 MG tablet Take 1 tablet (5 mg total) by mouth daily. 90 tablet 3   Cyanocobalamin (B-12) 2500 MCG TABS Take 1 tablet by mouth daily.     cyclobenzaprine (FLEXERIL) 10 MG tablet Take one tablet by mouht atr bedtime foir uncontrolled back and shoulder pain and spasm 30 tablet 1   ergocalciferol (VITAMIN D2) 1.25 MG (50000 UT) capsule Take 1 capsule (50,000 Units total) by mouth once a week. One capsule once weekly 12 capsule 3   escitalopram (LEXAPRO) 20 MG tablet Take 1 tablet (20 mg total) by mouth daily. 30 tablet 1   furosemide (LASIX) 20 MG tablet Take 1 tablet (20  mg total) by mouth daily. 90 tablet 3   gabapentin (NEURONTIN) 300 MG capsule Take 1 capsule (300 mg total) by mouth at bedtime. 90 capsule 3   mupirocin ointment (BACTROBAN) 2 % Apply twice daily to affected area for  7 to 10 days, then as needed 30 g 0   pantoprazole (PROTONIX) 40 MG tablet Take 1 tablet (40 mg total) by mouth daily. 145 tablet 0   phentermine (ADIPEX-P) 37.5 MG tablet Take one half tablet every morning  before breakfast 15 tablet 3   potassium chloride SA (KLOR-CON) 20 MEQ tablet Take 1 tablet (20 mEq total) by mouth daily. 90 tablet 3   No current facility-administered medications for this visit.     Musculoskeletal: Strength & Muscle Tone: unable to assess due to telemed visit Gait & Station: unable to assess due to telemed visit Patient leans: unable to assess due to telemed visit  Psychiatric Specialty Exam: Review of Systems  There were no vitals taken for this visit.There is no height or weight on file to calculate BMI.  General Appearance: Well Groomed  Eye Contact:  Good  Speech:  Clear and Coherent and Normal Rate  Volume:  Normal  Mood: euthymic  Affect:  Congruent  Thought Process:  Goal Directed and Linear  Orientation:  Full (Time, Place, and Person)  Thought Content: Logical   Suicidal Thoughts:  No  Homicidal Thoughts:  No  Memory:  Recent;   Fair Remote;   Good  Judgement:  Fair  Insight:  Fair  Psychomotor Activity:  Normal  Concentration:  Concentration: Good and Attention Span: Fair  Recall:  Good  Fund of Knowledge: Good  Language: Good  Akathisia:  Unable to assess, virtual visit  Handed:  Right  AIMS (if indicated): not done  Assets:  Communication Skills Desire for Improvement Financial Resources/Insurance Housing Social Support  ADL's:  Intact  Cognition: WNL  Sleep:  Fair   Screenings: GAD-7     Office Visit from 03/17/2019 in [facility] Counselor from 10/09/2018 in [place] [facility] [place] Phone Follow Up from 08/04/2018 in [place] [facility] Phone Follow Up from 07/16/2018 in [facility] Office Visit from 07/09/2018 in [facility]  Total GAD-7 Score 5 19 12 12 15     PHQ2-9     Office Visit from 07/15/2019 in [facility] Office Visit from 06/22/2019 in [place] Visit from 10/26/2018 in [place] from 10/09/2018 in [place] Phone Follow Up from 08/04/2018 in [facility]  PHQ-2 Total Score 0 0 6 6 6   PHQ-9 Total Score -- -- 17 20 15        Assessment and Plan: Patient appears to be doing much better compared to the past.  We will continue the same regimen.  1. MDD (major depressive disorder), recurrent, in partial remission (HCC)  - escitalopram (LEXAPRO) 20 MG tablet; Take 1 tablet (20 mg total) by mouth daily.  Dispense: 30 tablet; Refill: 1  2. Anxiety  - ALPRAZolam (XANAX) 0.25 MG tablet; Take 1 tablet (0.25 mg total) by mouth 2 (two) times daily as needed for anxiety.  Dispense: 60 tablet; Refill: 1 - ALPRAZolam (XANAX) 1 MG tablet; Take 1 tablet (1 mg total) by mouth every evening.  Dispense: 30 tablet; Refill: 1 - escitalopram (LEXAPRO) 20 MG tablet; Take 1 tablet (20 mg total) by mouth daily.  Dispense: 30 tablet; Refill: 1   Continue same regimen. F/up in 2 months.  Nev[place] Crane, MD 07/18/2020, 8:37 AM

## 2020-07-18 NOTE — Telephone Encounter (Signed)
Pt LVM that she came in contact with someone with Covid  I called her back and gave her some options

## 2020-07-31 ENCOUNTER — Other Ambulatory Visit (HOSPITAL_COMMUNITY): Payer: Self-pay | Admitting: Psychiatry

## 2020-07-31 DIAGNOSIS — F419 Anxiety disorder, unspecified: Secondary | ICD-10-CM

## 2020-07-31 DIAGNOSIS — F3341 Major depressive disorder, recurrent, in partial remission: Secondary | ICD-10-CM

## 2020-08-06 ENCOUNTER — Emergency Department (HOSPITAL_COMMUNITY): Payer: 59

## 2020-08-06 ENCOUNTER — Other Ambulatory Visit: Payer: Self-pay

## 2020-08-06 ENCOUNTER — Emergency Department (HOSPITAL_COMMUNITY)
Admission: EM | Admit: 2020-08-06 | Discharge: 2020-08-06 | Disposition: A | Discharge status: Home / Self Care | Payer: 59 | Attending: Emergency Medicine | Admitting: Emergency Medicine

## 2020-08-06 ENCOUNTER — Encounter (HOSPITAL_COMMUNITY): Payer: Self-pay | Admitting: *Deleted

## 2020-08-06 DIAGNOSIS — Z79899 Other long term (current) drug therapy: Secondary | ICD-10-CM | POA: Diagnosis not present

## 2020-08-06 DIAGNOSIS — R5383 Other fatigue: Secondary | ICD-10-CM | POA: Insufficient documentation

## 2020-08-06 DIAGNOSIS — R519 Headache, unspecified: Secondary | ICD-10-CM | POA: Insufficient documentation

## 2020-08-06 DIAGNOSIS — Z20822 Contact with and (suspected) exposure to covid-19: Secondary | ICD-10-CM | POA: Insufficient documentation

## 2020-08-06 DIAGNOSIS — M791 Myalgia, unspecified site: Secondary | ICD-10-CM | POA: Diagnosis not present

## 2020-08-06 DIAGNOSIS — R05 Cough: Secondary | ICD-10-CM | POA: Insufficient documentation

## 2020-08-06 DIAGNOSIS — R197 Diarrhea, unspecified: Secondary | ICD-10-CM | POA: Insufficient documentation

## 2020-08-06 DIAGNOSIS — R509 Fever, unspecified: Secondary | ICD-10-CM | POA: Diagnosis not present

## 2020-08-06 DIAGNOSIS — I10 Essential (primary) hypertension: Secondary | ICD-10-CM | POA: Diagnosis not present

## 2020-08-06 DIAGNOSIS — B349 Viral infection, unspecified: Secondary | ICD-10-CM

## 2020-08-06 LAB — SARS CORONAVIRUS 2 BY RT PCR (HOSPITAL ORDER, PERFORMED IN ~~LOC~~ HOSPITAL LAB): SARS Coronavirus 2: NEGATIVE

## 2020-08-06 MED ORDER — ACETAMINOPHEN 500 MG PO TABS
1000.0000 mg | ORAL_TABLET | Freq: Once | ORAL | Status: AC
  Administered 2020-08-06: 1000 mg via ORAL
  Filled 2020-08-06: qty 2

## 2020-08-06 NOTE — Discharge Instructions (Signed)
Covid testing was negative chest x-ray negative for pneumonia.  Symptoms of viral-like in nature.  Must agree that they are somewhat suggestive of Covid-like symptoms.  May want to be careful.  Return for any new or worse symptoms.  Would recommend you follow-up with your primary care doctor once you are feeling better for blood pressure checks.  Blood pressure was elevated here today 148/105.  Your primary care doctor can trend this to see if it needs additional treatment.  We would not choose to treat that in the face of the acute illness.  Would recommend taking Naprosyn for the headache and this will also help the fever or you could take Motrin.

## 2020-08-06 NOTE — ED Notes (Signed)
Pt in chair, pt c/o headache, pt has fever, md notified, tylenol given, pt states that she is ready to go home, pt verbalized understanding d/c instructions and follow up. Ginger ale given, discussed supportive therapy.

## 2020-08-06 NOTE — ED Triage Notes (Signed)
Pt with dizziness, chills, loss of taste and smell and diarrhea since Thursday.  Pt states she got the [place] and [place] vaccine 3 mos ago.  Pt has been around her nephew that has Covid.

## 2020-08-06 NOTE — ED Provider Notes (Addendum)
[facility] EMERGENCY DEPARTMENT Provider Note   CSN: [id number] Arrival date & time: 08/06/20  1653     History Chief Complaint  Patient presents with   covid symptoms    Olivia Woodward is a 54 y.o. female.  Patient presenting with symptoms highly suggestive of COVID-19 infection.  Symptoms started on Thursday she has had chills loss of taste and smell and diarrhea but no significant belly pain has some dizziness has had some cough.  She had [employer] vaccine 3 months ago.  She had been around her nephew that tested positive for Covid.  Patient is felt like she has had a fever as well and temp upon arrival here was 100.2.  Oxygen saturations have been in the upper 90s on room air.  Also has a headache.  But no neck stiffness.  Patient states overall she just feels really bad.        Past Medical History:  Diagnosis Date   Anemia    Generalized headaches    Hypertension     Patient Active Problem List   Diagnosis Date Noted   MDD (major depressive disorder), recurrent, in partial remission ([place]) 04/07/2020   Leg swelling 11/06/2019   Neck pain, bilateral 06/22/2019   Pigmented skin lesions 03/17/2019   Vaginal lesion 03/17/2019   Constipation 01/07/2019   Iron deficiency anemia 01/05/2019   Normocytic anemia 11/19/2018   MDD (major depressive disorder), recurrent episode, moderate ([place]) 09/08/2018   Depression, major, single episode, severe ([place]) 07/10/2018   Back pain 01/02/2017   Seasonal allergies 07/02/2016   ANEMIA 07/25/2010   FATIGUE 07/25/2010   Anxiety 06/24/2010   OTHER DRUG ALLERGY 05/03/2010   IRON DEFIC ANEMIA [place] DIET IRON INTAKE 02/09/2009   Goiter, unspecified 11/17/2007   Morbid obesity ([place]) 11/17/2007   Essential hypertension 11/17/2007    Past Surgical History:  Procedure Laterality Date   ABDOMINAL HYSTERECTOMY     fibroids, partial   BREAST REDUCTION SURGERY  2005   COLONOSCOPY WITH PROPOFOL N/A  05/10/2019   Procedure: COLONOSCOPY WITH PROPOFOL;  Surgeon: Daneil Dolin, MD;  [facility];  Service: Endoscopy;  Laterality: N/A;  2:45pm   GASTRIC BYPASS  2007   POLYPECTOMY  05/10/2019   Procedure: POLYPECTOMY;  Surgeon: Daneil Dolin, MD;  Location: [facility];  Service: Endoscopy;;  colon     OB History    Gravida  2   Para  2   Term  2   Preterm      AB      Living        SAB      TAB      Ectopic      Multiple      Live Births              Family History  Problem Relation Age of Onset   Hypertension Mother    Diabetes Mother    Drug abuse Mother    Hypertension Father    Hypertension Sister    Hypertension Sister    Colon cancer Neg Hx     Social History   Tobacco Use   Smoking status: Never Smoker   Smokeless tobacco: Never Used  Substance Use Topics   Alcohol use: No   Drug use: No    Home Medications Prior to Admission medications   Medication Sig Start Date End Date Taking? Authorizing Provider  ALPRAZolam (XANAX) 0.25 MG tablet Take 1 tablet (0.25 mg  total) by mouth 2 (two) times daily as needed for anxiety. 07/18/20   N[place] Crane, MD  ALPRAZolam Duanne Moron) 1 MG tablet Take 1 tablet (1 mg total) by mouth every evening. 07/18/20   [place] Crane, MD  amLODipine (NORVASC) 5 MG tablet Take 1 tablet (5 mg total) by mouth daily. 11/04/19   Fayrene Helper, MD  Cyanocobalamin (B-12) 2500 MCG TABS Take 1 tablet by mouth daily.    [provider]  cyclobenzaprine (FLEXERIL) 10 MG tablet Take one tablet by mouht atr bedtime foir uncontrolled back and shoulder pain and spasm 11/04/19   Fayrene Helper, MD  ergocalciferol (VITAMIN D2) 1.25 MG (50000 UT) capsule Take 1 capsule (50,000 Units total) by mouth once a week. One capsule once weekly 06/24/19   Francene Finders L, NP-C  escitalopram (LEXAPRO) 20 MG tablet Take 1 tablet by mouth once daily 07/31/20   Nevada Crane, MD  furosemide (LASIX) 20 MG tablet  Take 1 tablet (20 mg total) by mouth daily. 11/04/19   Fayrene Helper, MD  gabapentin (NEURONTIN) 300 MG capsule Take 1 capsule (300 mg total) by mouth at bedtime. 11/04/19   Fayrene Helper, MD  mupirocin ointment (BACTROBAN) 2 % Apply twice daily to affected area for  7 to 10 days, then as needed 01/24/20   Fayrene Helper, MD  pantoprazole (PROTONIX) 40 MG tablet Take 1 tablet (40 mg total) by mouth daily. 06/22/19   Fayrene Helper, MD  phentermine (ADIPEX-P) 37.5 MG tablet Take one half tablet every morning before breakfast 07/15/19   Fayrene Helper, MD  potassium chloride SA (KLOR-CON) 20 MEQ tablet Take 1 tablet (20 mEq total) by mouth daily. 11/04/19   Fayrene Helper, MD    Allergies    Ketorolac tromethamine  Review of Systems   Review of Systems  Constitutional: Positive for fatigue and fever. Negative for chills.  HENT: Negative for congestion, rhinorrhea and sore throat.   Eyes: Negative for visual disturbance.  Respiratory: Positive for cough. Negative for shortness of breath.   Cardiovascular: Negative for chest pain and leg swelling.  Gastrointestinal: Positive for diarrhea. Negative for abdominal pain, nausea and vomiting.  Genitourinary: Negative for dysuria.  Musculoskeletal: Positive for myalgias. Negative for back pain and neck pain.  Skin: Negative for rash.  Neurological: Positive for headaches. Negative for dizziness and light-headedness.  Hematological: Does not bruise/bleed easily.  Psychiatric/Behavioral: Negative for confusion.    Physical Exam Updated Vital Signs BP (!) 165/119    Pulse 97    Temp 98.9 F (37.2 C) (Oral)    Resp 18    Ht 1.676 m (5\' 6" )    Wt 118.8 kg    SpO2 98%    BMI 42.29 kg/m   Physical Exam Vitals and nursing note reviewed.  Constitutional:      General: She is not in acute distress.    Appearance: Normal appearance. She is well-developed.  HENT:     Head: Normocephalic and atraumatic.  Eyes:      Extraocular Movements: Extraocular movements intact.     Conjunctiva/sclera: Conjunctivae normal.     Pupils: Pupils are equal, round, and reactive to light.  Cardiovascular:     Rate and Rhythm: Normal rate and regular rhythm.     Heart sounds: No murmur heard.   Pulmonary:     Effort: Pulmonary effort is normal. No respiratory distress.     Breath sounds: Normal breath sounds.  Abdominal:     Palpations:  Abdomen is soft.     Tenderness: There is no abdominal tenderness.  Musculoskeletal:        General: Normal range of motion.     Cervical back: Normal range of motion and neck supple.  Skin:    General: Skin is warm and dry.  Neurological:     General: No focal deficit present.     Mental Status: She is alert and oriented to person, place, and time.     Cranial Nerves: No cranial nerve deficit.     Sensory: No sensory deficit.     Motor: No weakness.     ED Results / Procedures / Treatments   Labs (all labs ordered are listed, but only abnormal results are displayed) Labs Reviewed  SARS CORONAVIRUS 2 BY RT PCR (HOSPITAL ORDER, [facility])    EKG None  Radiology No results found.  Procedures Procedures (including critical care time)  Medications Ordered in ED Medications - No data to display  ED Course  I have reviewed the triage vital signs and the nursing notes.  Pertinent labs & imaging results that were available during my care of the patient were reviewed by me and considered in my medical decision making (see chart for details).    MDM Rules/Calculators/A&P                          Patient symptoms highly suggestive of a COVID-19 infection.  However her test was negative.  Could be viral process for another reason.  Will  get chest x-ray since test is negative patient without any hypoxia.  Would just will make sure that there is no pneumonia.  That would be treated with antibiotics.  Since patient symptoms are very classic.   We will pull her out of work for a couple days and then she can get retested.  If chest x-ray shows focal pneumonia we will will treat that as a community-acquired pneumonia.  Otherwise we will treat the patient symptomatically.  Have her follow-up with her primary care doctor.   Chest x-ray was negative.  No signs of pneumonia.  Covid testing negative.  Symptoms highly suggestive of viral type illness.  Still could possibly be Covid but that would assume that the test was not done properly.  Would recommend some precautions with the patient work note provided for 2 days.  Would recommend taking Naprosyn or Motrin for the headache and this will also help the fever.  Recommend following up with her primary care doctor to have blood pressures followed up.  It is elevated here today history of hypertension but not on any hypertensive meds at this point in time but would not make any adjustments in the face of the acute illness.  Final Clinical Impression(s) / ED Diagnoses Final diagnoses:  COVID-19 virus not detected    Rx / DC Orders ED Discharge Orders    None       Fredia Sorrow, MD 08/06/20 2008    Fredia Sorrow, MD 08/06/20 2009    Fredia Sorrow, MD 08/06/20 2048

## 2020-08-06 NOTE — ED Notes (Signed)
Pt in chair, pt c/o cough, fatigue and general body aches. Pt talking in full sentences, pt also reports a headache.

## 2020-08-08 ENCOUNTER — Telehealth (INDEPENDENT_AMBULATORY_CARE_PROVIDER_SITE_OTHER): Payer: 59 | Admitting: Internal Medicine

## 2020-08-08 ENCOUNTER — Encounter: Payer: Self-pay | Admitting: Internal Medicine

## 2020-08-08 ENCOUNTER — Telehealth (HOSPITAL_COMMUNITY): Payer: Self-pay | Admitting: Psychiatry

## 2020-08-08 ENCOUNTER — Other Ambulatory Visit: Payer: Self-pay

## 2020-08-08 ENCOUNTER — Telehealth (HOSPITAL_COMMUNITY): Payer: Self-pay | Admitting: *Deleted

## 2020-08-08 VITALS — BP 133/95 | Ht 66.0 in | Wt 262.0 lb

## 2020-08-08 DIAGNOSIS — J019 Acute sinusitis, unspecified: Secondary | ICD-10-CM | POA: Diagnosis not present

## 2020-08-08 MED ORDER — AMOXICILLIN-POT CLAVULANATE 875-125 MG PO TABS: 1.0000 | ORAL_TABLET | Freq: Two times a day (BID) | ORAL | 0 refills | Status: AC

## 2020-08-08 NOTE — Telephone Encounter (Signed)
Patient called asking if office received forms that may have been faxed to the [facility] location. Per pt these forms was  faxed 3 months ago and they told her they just refaxed them again this morning. Per pt she called the [place] location which is [name] office and spoke with Collie Siad and was informed they did not get it and to call the [place] location. Staff inpatient that fax was not received.

## 2020-08-08 NOTE — Patient Instructions (Signed)
Please take Augmentin as prescribed. Please continue to use Cold and sinus medication as needed.  Please stop Lasix and Adipex till acute phase resolves.  Please call back if headache does not resolve in 2-3 days.

## 2020-08-08 NOTE — Progress Notes (Signed)
Virtual Visit via Telephone Note   This visit type was conducted due to national recommendations for restrictions regarding the COVID-19 Pandemic (e.g. social distancing) in an effort to limit this patient's exposure and mitigate transmission in our community.  Due to her co-morbid illnesses, this patient is at least at moderate risk for complications without adequate follow up.  This format is felt to be most appropriate for this patient at this time.  The patient did not have access to video technology/had technical difficulties with video requiring transitioning to audio format only (telephone).  All issues noted in this document were discussed and addressed.  No physical exam could be performed with this format.  Evaluation Performed:  Follow-up visit  Date:  08/08/2020   ID:  Olivia Woodward, DOB [birth year], MRN [id number]  Patient Location: Home Provider Location: Office/Clinic  Location of Patient: Home Location of Provider: Telehealth Consent was obtain for visit to be over via telehealth. I verified that I am speaking with the correct person using two identifiers.  PCP:  Fayrene Helper, MD   Chief Complaint:  Headache  History of Present Illness:    Olivia Woodward is a 54 y.o. female with past medical history of hypertension, depression and morbid obesity has a televisit after a visit in ER on 08/06/2020.  Patient visited ER with complaints of headache, fatigue, fever with chills and loss of taste and smell.  Patient was found to be Covid negative in ER.  Patient was given Tylenol in ER.  Today, patient mentions runny nose and nasal congestion along with headache.  Patient states that the headache is constant for the last 3 days and is minimally responsive to Tylenol.  Patient mentions mild dizziness while walking. Patient also has nausea and loose stools for last 3 days.  Patient denies vision changes, chest pain, palpitations, or dyspnea.  Patient states that his nephew  was diagnosed with Covid about 2 weeks ago.  Patient denies any other sick contacts.  Past Medical, Surgical, Social History, Allergies, and Medications have been Reviewed.  Past Medical History:  Diagnosis Date  . Anemia   . Generalized headaches   . Hypertension    Past Surgical History:  Procedure Laterality Date  . ABDOMINAL HYSTERECTOMY     fibroids, partial  . BREAST REDUCTION SURGERY  2005  . COLONOSCOPY WITH PROPOFOL N/A 05/10/2019   Procedure: COLONOSCOPY WITH PROPOFOL;  Surgeon: Daneil Dolin, MD;  [facility];  Service: Endoscopy;  Laterality: N/A;  2:45pm  . GASTRIC BYPASS  2007  . POLYPECTOMY  05/10/2019   Procedure: POLYPECTOMY;  Surgeon: Daneil Dolin, MD;  [facility];  Service: Endoscopy;;  colon     Current Meds  Medication Sig  . ALPRAZolam (XANAX) 0.25 MG tablet Take 1 tablet (0.25 mg total) by mouth 2 (two) times daily as needed for anxiety.  . ALPRAZolam (XANAX) 1 MG tablet Take 1 tablet (1 mg total) by mouth every evening.  Marland Kitchen amLODipine (NORVASC) 5 MG tablet Take 1 tablet (5 mg total) by mouth daily.  . Cyanocobalamin (B-12) 2500 MCG TABS Take 1 tablet by mouth daily.  . cyclobenzaprine (FLEXERIL) 10 MG tablet Take one tablet by mouht atr bedtime foir uncontrolled back and shoulder pain and spasm  . ergocalciferol (VITAMIN D2) 1.25 MG (50000 UT) capsule Take 1 capsule (50,000 Units total) by mouth once a week. One capsule once weekly  . escitalopram (LEXAPRO) 20 MG tablet Take 1 tablet by mouth  once daily  . furosemide (LASIX) 20 MG tablet Take 1 tablet (20 mg total) by mouth daily.  Marland Kitchen gabapentin (NEURONTIN) 300 MG capsule Take 1 capsule (300 mg total) by mouth at bedtime.  . mupirocin ointment (BACTROBAN) 2 % Apply twice daily to affected area for  7 to 10 days, then as needed  . pantoprazole (PROTONIX) 40 MG tablet Take 1 tablet (40 mg total) by mouth daily.  . phentermine (ADIPEX-P) 37.5 MG tablet Take one half tablet every  morning before breakfast  . potassium chloride SA (KLOR-CON) 20 MEQ tablet Take 1 tablet (20 mEq total) by mouth daily.     Allergies:   Ketorolac tromethamine   ROS:   Please see the history of present illness.     All other systems reviewed and are negative.   Labs/Other Tests and Data Reviewed:    Recent Labs: No results found for requested labs within last 8760 hours.   Recent Lipid Panel Lab Results  Component Value Date/Time   CHOL 166 12/27/2017 09:49 AM   TRIG 50 12/27/2017 09:49 AM   HDL 83 12/27/2017 09:49 AM   CHOLHDL 2.0 12/27/2017 09:49 AM   LDLCALC 71 12/27/2017 09:49 AM    Wt Readings from Last 3 Encounters:  08/08/20 262 lb (118.8 kg)  08/06/20 262 lb (118.8 kg)  11/04/19 243 lb (110.2 kg)    ASSESSMENT & PLAN:    Acute rhinosinusitis COVID negative 2 days ago Continues to have nasal congestion and runny nose with headache Advised to take Augmentin and Cold and sinus OTC medications, stay hydrated Patient is resistant to take Tylenol as well as cold and sinus medications, counseled to adhere to treatment Advised to get follow up or urgent medical evaluation if symptoms don't improve in 2 days or has elevated BP, vision changes or focal weakness    Time:   Today, I have spent 10 minutes with the patient with telehealth technology discussing the above problems.     Medication Adjustments/Labs and Tests Ordered: Current medicines are reviewed at length with the patient today.  Concerns regarding medicines are outlined above.   Tests Ordered: No orders of the defined types were placed in this encounter.   Medication Changes: Meds ordered this encounter  Medications  . amoxicillin-clavulanate (AUGMENTIN) 875-125 MG tablet    Sig: Take 1 tablet by mouth 2 (two) times daily for 5 days.    Dispense:  10 tablet    Refill:  0    Disposition:  Follow up  Signed, Lindell Spar, MD  08/08/2020 10:57 AM     [place]

## 2020-08-09 ENCOUNTER — Telehealth (HOSPITAL_COMMUNITY): Payer: Self-pay | Admitting: *Deleted

## 2020-08-09 ENCOUNTER — Other Ambulatory Visit: Payer: 59

## 2020-08-09 DIAGNOSIS — Z20822 Contact with and (suspected) exposure to covid-19: Secondary | ICD-10-CM

## 2020-08-09 NOTE — Telephone Encounter (Signed)
Called patient to inform her of her FMLA paperwork had been faxed to [employer]. She thanked Probation officer.

## 2020-08-09 NOTE — Telephone Encounter (Signed)
Opening error 

## 2020-08-11 LAB — NOVEL CORONAVIRUS, NAA: SARS-CoV-2, NAA: NOT DETECTED

## 2020-08-11 LAB — SARS-COV-2, NAA 2 DAY TAT

## 2020-08-21 ENCOUNTER — Telehealth (INDEPENDENT_AMBULATORY_CARE_PROVIDER_SITE_OTHER): Payer: 59 | Admitting: Family Medicine

## 2020-08-21 ENCOUNTER — Encounter: Payer: Self-pay | Admitting: Family Medicine

## 2020-08-21 ENCOUNTER — Other Ambulatory Visit: Payer: Self-pay

## 2020-08-21 VITALS — BP 170/98 | HR 104 | Ht 66.0 in | Wt 262.0 lb

## 2020-08-21 DIAGNOSIS — F331 Major depressive disorder, recurrent, moderate: Secondary | ICD-10-CM

## 2020-08-21 DIAGNOSIS — R059 Cough, unspecified: Secondary | ICD-10-CM | POA: Insufficient documentation

## 2020-08-21 DIAGNOSIS — I10 Essential (primary) hypertension: Secondary | ICD-10-CM | POA: Diagnosis not present

## 2020-08-21 DIAGNOSIS — G4483 Primary cough headache: Secondary | ICD-10-CM

## 2020-08-21 MED ORDER — PROMETHAZINE-DM 6.25-15 MG/5ML PO SYRP: ORAL_SOLUTION | ORAL | 0 refills | Status: DC

## 2020-08-21 MED ORDER — PREDNISONE 5 MG (21) PO TBPK: 5.0000 mg | ORAL_TABLET | ORAL | 0 refills | Status: DC

## 2020-08-21 MED ORDER — MECLIZINE HCL 25 MG PO TABS: 25.0000 mg | ORAL_TABLET | Freq: Three times a day (TID) | ORAL | 0 refills | Status: DC | PRN

## 2020-08-21 MED ORDER — MONTELUKAST SODIUM 10 MG PO TABS: 10.0000 mg | ORAL_TABLET | Freq: Every day | ORAL | 1 refills | Status: DC

## 2020-08-21 NOTE — Patient Instructions (Addendum)
Appointment in office with mD in 10 days, re evaluate cough and blood pressure and general follow up, call if you need me sooner  Covid test today please  Fasting CBC, lipid, cmp and eGFr, TSH, vit D and vit B12 and hepatitis C screen this week  Prednisone , phenergan dM, meclizine and singulair are prescribed  Use tylenol for headache, hopefullt the prednisone and singulair will help to break the headache and cough cycle  It is important that you exercise regularly at least 30 minutes 5 times a week. If you develop chest pain, have severe difficulty breathing, or feel very tired, stop exercising immediately and seek medical attention  Think about what you will eat, plan ahead. Choose " clean, green, fresh or frozen" over canned, processed or packaged foods which are more sugary, salty and fatty. 70 to 75% of food eaten should be vegetables and fruit. Three meals at set times with snacks allowed between meals, but they must be fruit or vegetables. Aim to eat over a 12 hour period , example 7 am to 7 pm, and STOP after  your last meal of the day. Drink water,generally about 64 ounces per day, no other drink is as healthy. Fruit juice is best enjoyed in a healthy way, by EATING the fruit.   Thanks for choosing [facility], we consider it a privelige to serve you.  

## 2020-08-21 NOTE — Progress Notes (Signed)
Virtual Visit via Telephone Note  I connected with Olivia Woodward on 08/21/20 at 10:40 AM EDT by telephone and verified that I am speaking with the correct person using two identifiers.  Location: Patient: home  Provider: office   I discussed the limitations, risks, security and privacy concerns of performing an evaluation and management service by telephone and the availability of in person appointments. I also discussed with the patient that there may be a patient responsible charge related to this service. The patient expressed understanding and agreed to proceed.   History of Present Illness: 3 week h/o cough intermittent fever and chills, no fevr since 1 week, still having chills, fever up to 101.Nephew dx with covid 1 month ago, was exposed prior to him getting he virus, hew is 7. No sputum initially had sputumclear, no sputum for at least 10 days. Drainage. np ear pain or sinmus pressure Generalized pounding headache , vertigo, headache worse when she walks. Pressure in ears with walking. Sore throat  Loss of taste and smell x 3 weeks Has tested negative for Covid     Observations/Objective: BP (!) 170/98   Pulse (!) 104   Ht 5\' 6"  (1.676 m)   Wt 262 lb (118.8 kg)   BMI 42.29 kg/m  Good communication with no confusion and intact memory. Alert and oriented x 3 Dry hacking cough during interview, sporadically and sounds as though she has significant sinus congestion  Assessment and Plan:  Cough 3-week history of persistent with no established underlying cause.  Prednisone short-term along with Singulair.and phenergan dM. Retest for covid and obtain labs. Negative cXR since onset of symptoms  Essential hypertension Uncontrolled per patient record.  Please office evaluation and will arrange in the next 10 days. DASH diet and commitment to daily physical activity for a minimum of 30 minutes discussed and encouraged, as a part of hypertension management. The importance of  attaining a healthy weight is also discussed.  BP/Weight 08/21/2020 08/08/2020 08/06/2020 11/04/2019 08/26/2019 07/15/2019 7/40/8144  Systolic BP 818 563 149 702 637 858 850  Diastolic BP 98 95 95 80 78 85 91  Wt. (Lbs) 262 262 262 243 - 246 -  BMI 42.29 42.29 42.29 39.22 - 39.71 -  Some encounter information is confidential and restricted. Go to Review Flowsheets activity to see all data.       Morbid obesity ([place])  Patient re-educated about  the importance of commitment to a  minimum of 150 minutes of exercise per week as able.  The importance of healthy food choices with portion control discussed, as well as eating regularly and within a 12 hour window most days. The need to choose "clean , green" food 50 to 75% of the time is discussed, as well as to make water the primary drink and set a goal of 64 ounces water daily.    Weight /BMI 08/21/2020 08/08/2020 08/06/2020  WEIGHT 262 lb 262 lb 262 lb  HEIGHT 5\' 6"  5\' 6"  5\' 6"   BMI 42.29 kg/m2 42.29 kg/m2 42.29 kg/m2  Some encounter information is confidential and restricted. Go to Review Flowsheets activity to see all data.      MDD (major depressive disorder), recurrent episode, moderate ([place]) Being treated by Psych, and in therapy, will score at visit    Follow Up Instructions:    I discussed the assessment and treatment plan with the patient. The patient was provided an opportunity to ask questions and all were answered. The patient agreed with the plan  and demonstrated an understanding of the instructions.   The patient was advised to call back or seek an in-person evaluation if the symptoms worsen or if the condition fails to improve as anticipated.  I provided 20 minutes of non-face-to-face time during this encounter.   Tula Nakayama, MD

## 2020-08-22 ENCOUNTER — Other Ambulatory Visit: Payer: Self-pay

## 2020-08-22 ENCOUNTER — Other Ambulatory Visit: Payer: 59

## 2020-08-22 ENCOUNTER — Encounter: Payer: Self-pay | Admitting: Family Medicine

## 2020-08-22 DIAGNOSIS — R519 Headache, unspecified: Secondary | ICD-10-CM | POA: Insufficient documentation

## 2020-08-22 DIAGNOSIS — Z20822 Contact with and (suspected) exposure to covid-19: Secondary | ICD-10-CM

## 2020-08-22 NOTE — Assessment & Plan Note (Signed)
Generalized headache associated with cough and head congestion.  Short dose of prednisone and daily Singulair as well as use of Tylenol as needed for headache should abort this.  If patient develops new weakness or numbness visual disturbance she should go to the emergency room.

## 2020-08-22 NOTE — Assessment & Plan Note (Signed)
3-week history of persistent with no established underlying cause.  Prednisone short-term along with Singulair.and phenergan dM. Retest for covid and obtain labs. Negative cXR since onset of symptoms

## 2020-08-22 NOTE — Assessment & Plan Note (Signed)
Uncontrolled per patient record.  Please office evaluation and will arrange in the next 10 days. DASH diet and commitment to daily physical activity for a minimum of 30 minutes discussed and encouraged, as a part of hypertension management. The importance of attaining a healthy weight is also discussed.  BP/Weight 08/21/2020 08/08/2020 08/06/2020 11/04/2019 08/26/2019 07/15/2019 5/74/7340  Systolic BP 370 964 383 818 403 754 360  Diastolic BP 98 95 95 80 78 85 91  Wt. (Lbs) 262 262 262 243 - 246 -  BMI 42.29 42.29 42.29 39.22 - 39.71 -  Some encounter information is confidential and restricted. Go to Review Flowsheets activity to see all data.

## 2020-08-22 NOTE — Assessment & Plan Note (Signed)
  Patient re-educated about  the importance of commitment to a  minimum of 150 minutes of exercise per week as able.  The importance of healthy food choices with portion control discussed, as well as eating regularly and within a 12 hour window most days. The need to choose "clean , green" food 50 to 75% of the time is discussed, as well as to make water the primary drink and set a goal of 64 ounces water daily.    Weight /BMI 08/21/2020 08/08/2020 08/06/2020  WEIGHT 262 lb 262 lb 262 lb  HEIGHT 5\' 6"  5\' 6"  5\' 6"   BMI 42.29 kg/m2 42.29 kg/m2 42.29 kg/m2  Some encounter information is confidential and restricted. Go to Review Flowsheets activity to see all data.

## 2020-08-22 NOTE — Assessment & Plan Note (Signed)
Being treated by Psych, and in therapy, will score at visit

## 2020-08-23 ENCOUNTER — Other Ambulatory Visit: Payer: Self-pay

## 2020-08-23 DIAGNOSIS — D649 Anemia, unspecified: Secondary | ICD-10-CM

## 2020-08-23 DIAGNOSIS — E559 Vitamin D deficiency, unspecified: Secondary | ICD-10-CM

## 2020-08-23 DIAGNOSIS — Z1159 Encounter for screening for other viral diseases: Secondary | ICD-10-CM

## 2020-08-23 DIAGNOSIS — D508 Other iron deficiency anemias: Secondary | ICD-10-CM

## 2020-08-23 DIAGNOSIS — I10 Essential (primary) hypertension: Secondary | ICD-10-CM

## 2020-08-23 DIAGNOSIS — Z13220 Encounter for screening for lipoid disorders: Secondary | ICD-10-CM

## 2020-08-23 LAB — SPECIMEN STATUS REPORT

## 2020-08-23 LAB — NOVEL CORONAVIRUS, NAA: SARS-CoV-2, NAA: NOT DETECTED

## 2020-08-23 LAB — SARS-COV-2, NAA 2 DAY TAT

## 2020-08-28 ENCOUNTER — Telehealth (HOSPITAL_COMMUNITY): Payer: Self-pay | Admitting: *Deleted

## 2020-08-28 NOTE — Telephone Encounter (Signed)
Called and spoke with Probation officer but frustrated with me and this agency for not completing her papers correctly, left part D empty and did not put the correct dates on her paperwork so she can keep her job. States she missed a day or two of work in Sept and they are not covering it because we didn't complete the forms right. States to me if I dont understand what she is saying to please have Dr [facility] Care call her so she can explain it to her. She would like the dates on the paper to say 07/19/20-11/17/20. She states they have already approved those dates so "just in case" she has an appt with the Dr on 10/27 so encouraged for her to discuss it with the Dr at that time but she insisted on those dates for now. Will relate this info to dr.

## 2020-08-29 NOTE — Telephone Encounter (Signed)
Done

## 2020-09-05 ENCOUNTER — Telehealth (INDEPENDENT_AMBULATORY_CARE_PROVIDER_SITE_OTHER): Payer: PRIVATE HEALTH INSURANCE | Admitting: Family Medicine

## 2020-09-05 ENCOUNTER — Encounter: Payer: Self-pay | Admitting: Family Medicine

## 2020-09-05 ENCOUNTER — Other Ambulatory Visit: Payer: Self-pay

## 2020-09-05 VITALS — Ht 66.0 in | Wt 233.0 lb

## 2020-09-05 DIAGNOSIS — F3341 Major depressive disorder, recurrent, in partial remission: Secondary | ICD-10-CM | POA: Diagnosis not present

## 2020-09-05 DIAGNOSIS — R059 Cough, unspecified: Secondary | ICD-10-CM | POA: Diagnosis not present

## 2020-09-05 MED ORDER — ALBUTEROL SULFATE HFA 108 (90 BASE) MCG/ACT IN AERS: 2.0000 | INHALATION_SPRAY | Freq: Four times a day (QID) | RESPIRATORY_TRACT | 0 refills | Status: DC | PRN

## 2020-09-05 MED ORDER — PREDNISONE 10 MG PO TABS: ORAL_TABLET | ORAL | 0 refills | Status: DC

## 2020-09-05 NOTE — Patient Instructions (Addendum)
F/U in office with MD in 2 weeks, call if you need me sooner  Prednisone  and Proventil are pres cribed  Please get labs ordered, also you have a sinus x ray ordered and a lung scan ordered.  You are referred urgently to lung Specialist, we will call with appointment information  Thanks for choosing [facility], we consider it a privelige to serve you.

## 2020-09-05 NOTE — Progress Notes (Signed)
Virtual Visit via video Note  I connected with Kiosha Buchan Sackmann on 09/05/20 at  8:00 AM EDT byvideoand verified that I am speaking with the correct person using two identifiers.  Location: Patient: home Provider: office   I discussed the limitations, risks, security and privacy concerns of performing an evaluation and management service by video and the availability of in person appointments. I also discussed with the patient that there may be a patient responsible charge related to this service. The patient expressed understanding and agreed to proceed.   History of Present Illness: Sick and unable to work since Sept 19,coughiong excessively, initally had feverup to 102, no fever in 2 weeks, had sputum , was clear, last week yellow green sputum x 3 days No taste , has had sinus pressure and drainage Hoarseness and pain in lower rib cage isnow starting, with excess cough Observations/Objective:   Ht 5\' 6"  (1.676 m)   Wt 233 lb (105.7 kg)   BMI 37.61 kg/m  Good communication with no confusion and intact memory. Ill appearing, coughing excesively during visit, hoarse  Assessment and Plan:  Cough Cough x 1 month, worsening with sore throat, hoarseness, fatigue , headache. covid test  Negative. Unable to work from symptom onset Short course of prednisone and albuterol MDI Chest scan and sinus X ray Pulmonary eval   MDD (major depressive disorder), recurrent, in partial remission ([place]) Managed by Psych and improving   Follow Up Instructions:    I discussed the assessment and treatment plan with the patient. The patient was provided an opportunity to ask questions and all were answered. The patient agreed with the plan and demonstrated an understanding of the instructions.   The patient was advised to call back or seek an in-person evaluation if the symptoms worsen or if the condition fails to improve as anticipated.  I provided 20 minutes of non-face-to-face time during this  encounter.   Tula Nakayama, MD

## 2020-09-06 ENCOUNTER — Other Ambulatory Visit (HOSPITAL_COMMUNITY): Payer: Self-pay | Admitting: Family Medicine

## 2020-09-06 ENCOUNTER — Other Ambulatory Visit: Payer: Self-pay

## 2020-09-06 ENCOUNTER — Telehealth: Payer: Self-pay

## 2020-09-06 ENCOUNTER — Ambulatory Visit (HOSPITAL_COMMUNITY)
Admission: RE | Admit: 2020-09-06 | Discharge: 2020-09-06 | Disposition: A | Discharge status: Home / Self Care | Payer: 59 | Source: Ambulatory Visit | Attending: Family Medicine | Admitting: Family Medicine

## 2020-09-06 DIAGNOSIS — R7989 Other specified abnormal findings of blood chemistry: Secondary | ICD-10-CM

## 2020-09-06 DIAGNOSIS — R059 Cough, unspecified: Secondary | ICD-10-CM | POA: Insufficient documentation

## 2020-09-06 DIAGNOSIS — D649 Anemia, unspecified: Secondary | ICD-10-CM

## 2020-09-06 DIAGNOSIS — E559 Vitamin D deficiency, unspecified: Secondary | ICD-10-CM

## 2020-09-06 DIAGNOSIS — Z1159 Encounter for screening for other viral diseases: Secondary | ICD-10-CM

## 2020-09-06 DIAGNOSIS — D508 Other iron deficiency anemias: Secondary | ICD-10-CM

## 2020-09-06 DIAGNOSIS — Z1231 Encounter for screening mammogram for malignant neoplasm of breast: Secondary | ICD-10-CM

## 2020-09-06 DIAGNOSIS — Z13220 Encounter for screening for lipoid disorders: Secondary | ICD-10-CM

## 2020-09-06 DIAGNOSIS — I10 Essential (primary) hypertension: Secondary | ICD-10-CM

## 2020-09-06 NOTE — Telephone Encounter (Signed)
[employer] STD Disability Forms   Copied Noted  sleeved

## 2020-09-06 NOTE — Telephone Encounter (Signed)
Labs re-ordered for [facility] as labs were previously ordered to be drawn at the hospital. Labs were faxed to lab.

## 2020-09-06 NOTE — Telephone Encounter (Signed)
Pt is calling states that her labs are not in the system , that Dr Moshe Cipro advised her to get yesterday --

## 2020-09-07 ENCOUNTER — Other Ambulatory Visit: Payer: Self-pay | Admitting: Family Medicine

## 2020-09-07 ENCOUNTER — Telehealth: Payer: Self-pay | Admitting: Family Medicine

## 2020-09-07 ENCOUNTER — Telehealth: Payer: Self-pay | Admitting: *Deleted

## 2020-09-07 DIAGNOSIS — R7989 Other specified abnormal findings of blood chemistry: Secondary | ICD-10-CM

## 2020-09-07 DIAGNOSIS — R053 Chronic cough: Secondary | ICD-10-CM

## 2020-09-07 LAB — CMP14+EGFR
ALT: 10 IU/L (ref 0–32)
AST: 14 IU/L (ref 0–40)
Albumin/Globulin Ratio: 1.4 (ref 1.2–2.2)
Albumin: 3.7 g/dL — ABNORMAL LOW (ref 3.8–4.9)
Alkaline Phosphatase: 52 IU/L (ref 44–121)
BUN/Creatinine Ratio: 9 (ref 9–23)
BUN: 8 mg/dL (ref 6–24)
Bilirubin Total: 0.6 mg/dL (ref 0.0–1.2)
CO2: 24 mmol/L (ref 20–29)
Calcium: 9.2 mg/dL (ref 8.7–10.2)
Chloride: 103 mmol/L (ref 96–106)
Creatinine, Ser: 0.88 mg/dL (ref 0.57–1.00)
GFR calc Af Amer: 87 mL/min/{1.73_m2} (ref >59)
GFR calc non Af Amer: 76 mL/min/{1.73_m2} (ref >59)
Globulin, Total: 2.7 g/dL (ref 1.5–4.5)
Glucose: 80 mg/dL (ref 65–99)
Potassium: 4.1 mmol/L (ref 3.5–5.2)
Sodium: 142 mmol/L (ref 134–144)
Total Protein: 6.4 g/dL (ref 6.0–8.5)

## 2020-09-07 LAB — TSH: TSH: 0.278 u[IU]/mL — ABNORMAL LOW (ref 0.450–4.500)

## 2020-09-07 LAB — LIPID PANEL
Chol/HDL Ratio: 2.6 ratio (ref 0.0–4.4)
Cholesterol, Total: 216 mg/dL — ABNORMAL HIGH (ref 100–199)
HDL: 82 mg/dL (ref >39)
LDL Chol Calc (NIH): 124 mg/dL — ABNORMAL HIGH (ref 0–99)
Triglycerides: 58 mg/dL (ref 0–149)
VLDL Cholesterol Cal: 10 mg/dL (ref 5–40)

## 2020-09-07 LAB — CBC
Hematocrit: 43.8 % (ref 34.0–46.6)
Hemoglobin: 14.1 g/dL (ref 11.1–15.9)
MCH: 31.8 pg (ref 26.6–33.0)
MCHC: 32.2 g/dL (ref 31.5–35.7)
MCV: 99 fL — ABNORMAL HIGH (ref 79–97)
Platelets: 265 10*3/uL (ref 150–450)
RBC: 4.44 x10E6/uL (ref 3.77–5.28)
RDW: 11.9 % (ref 11.7–15.4)
WBC: 5.9 10*3/uL (ref 3.4–10.8)

## 2020-09-07 LAB — VITAMIN B12: Vitamin B-12: 303 pg/mL (ref 232–1245)

## 2020-09-07 LAB — VITAMIN D 25 HYDROXY (VIT D DEFICIENCY, FRACTURES): Vit D, 25-Hydroxy: 10.1 ng/mL — ABNORMAL LOW (ref 30.0–100.0)

## 2020-09-07 LAB — HEPATITIS C ANTIBODY: Hep C Virus Ab: <0.1 s/co ratio (ref 0.0–0.9)

## 2020-09-07 NOTE — Telephone Encounter (Signed)
Pls see result note Also she needs to get a repeat  covid test. Want to schedule in office visit next week and want this test result, pls advise and arrange thanks

## 2020-09-07 NOTE — Telephone Encounter (Signed)
Done and pt aware

## 2020-09-07 NOTE — Telephone Encounter (Signed)
Error

## 2020-09-11 ENCOUNTER — Other Ambulatory Visit: Payer: Self-pay

## 2020-09-11 ENCOUNTER — Encounter: Payer: Self-pay | Admitting: Family Medicine

## 2020-09-11 ENCOUNTER — Ambulatory Visit (INDEPENDENT_AMBULATORY_CARE_PROVIDER_SITE_OTHER): Payer: PRIVATE HEALTH INSURANCE | Admitting: Family Medicine

## 2020-09-11 VITALS — BP 136/100 | HR 69 | Resp 16 | Ht 65.0 in | Wt 256.0 lb

## 2020-09-11 DIAGNOSIS — R059 Cough, unspecified: Secondary | ICD-10-CM

## 2020-09-11 DIAGNOSIS — I10 Essential (primary) hypertension: Secondary | ICD-10-CM

## 2020-09-11 DIAGNOSIS — R490 Dysphonia: Secondary | ICD-10-CM

## 2020-09-11 DIAGNOSIS — R42 Dizziness and giddiness: Secondary | ICD-10-CM | POA: Diagnosis not present

## 2020-09-11 MED ORDER — HYDROCODONE-HOMATROPINE 5-1.5 MG/5ML PO SYRP: ORAL_SOLUTION | ORAL | 0 refills | Status: DC

## 2020-09-11 MED ORDER — AMLODIPINE BESYLATE 10 MG PO TABS: 10.0000 mg | ORAL_TABLET | Freq: Every day | ORAL | 3 refills | Status: DC

## 2020-09-11 NOTE — Progress Notes (Signed)
° °  [facility] SCHUCHART     MRN: [id number]      DOB: [birth year]   HPI Ms. Park is here for follow up from and illness which started 09/17 and has continue to current with NO relief, initially fever and vomit, both resolved after 2 weeks Cough has persisited sinc e 09/17 and also daily vertigo Loss of voice since Oct 20 Hurting in rib cage due to excess cough, sometimes hard to breathe hemmorhoid prolapsed this morning with BRRB Wetting on herself with cough   ROS Denies  palpitations and leg swelling Denies abdominal pain, nausea, vomiting,diarrhea or constipation.   Denies dysuria, frequency, hesitancy. Denies joint pain, swelling and limitation in mobility. Denies headaches, seizures, numbness, or tingling. Denies uncontrolled  depression, anxiety or insomnia. Denies skin break down or rash.   PE  BP (!) 136/100    Pulse 69    Resp 16    Ht 5\' 5"  (1.651 m)    Wt 256 lb (116.1 kg)    SpO2 95%    BMI 42.60 kg/m   Patient alert and ill appearing, hoarse, coughing spells during visit noted HEENT: No facial asymmetry, EOMI,     Neck supple .No sinus tenderness  Chest: adequate but reduced  air entry, no crackles , or wheezes heard  CVS: S1, S2 no murmurs, no S3.Regular rate.  ABD: Soft non tender.   Ext: No edema  MS: Adequate ROM spine, shoulders, hips and knees.  Skin: Intact, no ulcerations or rash noted.  Psych: Good eye contact, normal affect. Memory intact not anxious or depressed appearing.  CNS: CN 2-12 intact, power,  normal throughout.no focal deficits noted.   Assessment & Plan  Essential hypertension Uncontrolled , increase amlodipine dose to 10 mg daily DASH diet and commitment to daily physical activity for a minimum of 30 minutes discussed and encouraged, as a part of hypertension management. The importance of attaining a healthy weight is also discussed.  BP/Weight 09/11/2020 09/05/2020 08/21/2020 08/08/2020 08/06/2020 11/04/2019 46/03/353  Systolic BP  656 - 812 751 700 174 944  Diastolic BP 967 - 98 95 95 80 78  Wt. (Lbs) 256 233 262 262 262 243 -  BMI 42.6 37.61 42.29 42.29 42.29 39.22 -  Some encounter information is confidential and restricted. Go to Review Flowsheets activity to see all data.       Cough 4 week h/o disabling cough, Pulmonary eval and management, rept covid test, hycodan at bedtime , chest scan pending  Vertigo 1 month h/o vertigo daily, ENT to eval and manage asap  Hoarseness of voice 1 week h/o loss of voice related to excess cough, ENT to eval and manage

## 2020-09-11 NOTE — Patient Instructions (Addendum)
F/u in office with MD first week in December, call if you need me before  Increased dose of amlodipine to 10 mg 1 daily as blood pressure is high.  New to as a cough suppressant is Hycodan syrup 1 teaspoon only at bedtime as needed.  You are referred to ENT to evaluate chronic hoarseness and dizziness.  I will attempt to get a sooner appointment with pulmonary to evaluate and manage her chronic cough.  Nurse to schedule Covid test prior to discharge.  Work excuse from September 17 to October 23, 2020 unless there is symptom resolution prior to this

## 2020-09-12 ENCOUNTER — Telehealth: Payer: Self-pay | Admitting: Family Medicine

## 2020-09-12 ENCOUNTER — Other Ambulatory Visit: Payer: Self-pay

## 2020-09-12 ENCOUNTER — Other Ambulatory Visit: Payer: PRIVATE HEALTH INSURANCE

## 2020-09-12 DIAGNOSIS — Z20822 Contact with and (suspected) exposure to covid-19: Secondary | ICD-10-CM

## 2020-09-12 NOTE — Telephone Encounter (Signed)
FMLA FORMS RECEIVED  COPIED NOTED SLEEVED

## 2020-09-13 ENCOUNTER — Encounter (HOSPITAL_COMMUNITY): Payer: Self-pay | Admitting: Psychiatry

## 2020-09-13 ENCOUNTER — Ambulatory Visit: Payer: 59 | Admitting: Family Medicine

## 2020-09-13 ENCOUNTER — Encounter: Payer: Self-pay | Admitting: Family Medicine

## 2020-09-13 ENCOUNTER — Telehealth (INDEPENDENT_AMBULATORY_CARE_PROVIDER_SITE_OTHER): Payer: PRIVATE HEALTH INSURANCE | Admitting: Psychiatry

## 2020-09-13 ENCOUNTER — Other Ambulatory Visit: Payer: Self-pay

## 2020-09-13 DIAGNOSIS — F419 Anxiety disorder, unspecified: Secondary | ICD-10-CM

## 2020-09-13 DIAGNOSIS — R490 Dysphonia: Secondary | ICD-10-CM | POA: Insufficient documentation

## 2020-09-13 DIAGNOSIS — F3341 Major depressive disorder, recurrent, in partial remission: Secondary | ICD-10-CM

## 2020-09-13 DIAGNOSIS — R42 Dizziness and giddiness: Secondary | ICD-10-CM | POA: Insufficient documentation

## 2020-09-13 LAB — NOVEL CORONAVIRUS, NAA: SARS-CoV-2, NAA: NOT DETECTED

## 2020-09-13 LAB — SARS-COV-2, NAA 2 DAY TAT

## 2020-09-13 MED ORDER — ESCITALOPRAM OXALATE 20 MG PO TABS: 20.0000 mg | ORAL_TABLET | Freq: Every day | ORAL | 1 refills | Status: DC

## 2020-09-13 MED ORDER — ALPRAZOLAM 1 MG PO TABS: 1.0000 mg | ORAL_TABLET | Freq: Every evening | ORAL | 1 refills | Status: DC

## 2020-09-13 MED ORDER — ALPRAZOLAM 0.25 MG PO TABS: 0.2500 mg | ORAL_TABLET | Freq: Two times a day (BID) | ORAL | 1 refills | Status: DC | PRN

## 2020-09-13 NOTE — Progress Notes (Signed)
[facility] MD OP Progress Note  Virtual Visit via Telephone Note  I connected with Olivia Woodward on 09/13/20 at  8:40 AM EDT by telephone and verified that I am speaking with the correct person using two identifiers.  Location: Patient: home Provider: Clinic   I discussed the limitations, risks, security and privacy concerns of performing an evaluation and management service by telephone and the availability of in person appointments. I also discussed with the patient that there may be a patient responsible charge related to this service. The patient expressed understanding and agreed to proceed.   I provided 14 minutes of non-face-to-face time during this encounter.    09/13/2020 8:53 AM Olivia Woodward  MRN:  [id number]  Chief Complaint:  " I have been sick with bronchitis."  HPI: Patient started quite sick on the phone.  She stated that she has been down because of bronchitis.  She stated that otherwise she is doing fairly well.  She denied any significant issues or concerns at this time.  She requested for refills for her medications.   Visit Diagnosis:    ICD-10-CM   1. MDD (major depressive disorder), recurrent, in partial remission ([place])  F33.41   2. Anxiety  F41.9     Past Psychiatric History: Depression, anxiety  Past Medical History:  Past Medical History:  Diagnosis Date  . Anemia   . Generalized headaches   . Hypertension     Past Surgical History:  Procedure Laterality Date  . ABDOMINAL HYSTERECTOMY     fibroids, partial  . BREAST REDUCTION SURGERY  2005  . COLONOSCOPY WITH PROPOFOL N/A 05/10/2019   Procedure: COLONOSCOPY WITH PROPOFOL;  Surgeon: Daneil Dolin, MD;  [facility]: [facility];  Service: Endoscopy;  Laterality: N/A;  2:45pm  . GASTRIC BYPASS  2007  . POLYPECTOMY  05/10/2019   Procedure: POLYPECTOMY;  Surgeon: Daneil Dolin, MD;  Location: [facility];  Service: Endoscopy;;  colon    Family Psychiatric History: See Below  Family History:   Family History  Problem Relation Age of Onset  . Hypertension Mother   . Diabetes Mother   . Drug abuse Mother   . Hypertension Father   . Hypertension Sister   . Hypertension Sister   . Colon cancer Neg Hx     Social History:  Social History   Socioeconomic History  . Marital status: Single    Spouse name: Not on file  . Number of children: Not on file  . Years of education: Not on file  . Highest education level: Not on file  Occupational History  . Not on file  Tobacco Use  . Smoking status: Never Smoker  . Smokeless tobacco: Never Used  Substance and Sexual Activity  . Alcohol use: No  . Drug use: No  . Sexual activity: Yes    Birth control/protection: Condom  Other Topics Concern  . Not on file  Social History Narrative  . Not on file   Social Determinants of Health   Financial Resource Strain:   . Difficulty of Paying Living Expenses: Not on file  Food Insecurity:   . Worried About Charity fundraiser in the Last Year: Not on file  . Ran Out of Food in the Last Year: Not on file  Transportation Needs:   . Lack of Transportation (Medical): Not on file  . Lack of Transportation (Non-Medical): Not on file  Physical Activity:   . Days of Exercise per Week: Not on file  .  Minutes of Exercise per Session: Not on file  Stress:   . Feeling of Stress : Not on file  Social Connections:   . Frequency of Communication with Friends and Family: Not on file  . Frequency of Social Gatherings with Friends and Family: Not on file  . Attends Religious Services: Not on file  . Active Member of Clubs or Organizations: Not on file  . Attends Archivist Meetings: Not on file  . Marital Status: Not on file    Allergies:  Allergies  Allergen Reactions  . Ketorolac Tromethamine Hives    Lips swelled    Metabolic Disorder Labs: Lab Results  Component Value Date   HGBA1C 5.3 07/26/2010   No results found for: PROLACTIN Lab Results  Component Value Date    CHOL 216 (H) 09/06/2020   TRIG 58 09/06/2020   HDL 82 09/06/2020   CHOLHDL 2.6 09/06/2020   VLDL 10 01/02/2017   LDLCALC 124 (H) 09/06/2020   LDLCALC 71 12/27/2017   Lab Results  Component Value Date   TSH 0.278 (L) 09/06/2020   TSH 0.279 (L) 09/06/2020    Therapeutic Level Labs: No results found for: LITHIUM No results found for: VALPROATE No components found for:  CBMZ  Current Medications: Current Outpatient Medications  Medication Sig Dispense Refill  . albuterol (VENTOLIN HFA) 108 (90 Base) MCG/ACT inhaler Inhale 2 puffs into the lungs every 6 (six) hours as needed for wheezing or shortness of breath. 18 g 0  . ALPRAZolam (XANAX) 0.25 MG tablet Take 1 tablet (0.25 mg total) by mouth 2 (two) times daily as needed for anxiety. 60 tablet 1  . ALPRAZolam (XANAX) 1 MG tablet Take 1 tablet (1 mg total) by mouth every evening. 30 tablet 1  . amLODipine (NORVASC) 10 MG tablet Take 1 tablet (10 mg total) by mouth daily. 90 tablet 3  . Cyanocobalamin (B-12) 2500 MCG TABS Take 1 tablet by mouth daily.    . ergocalciferol (VITAMIN D2) 1.25 MG (50000 UT) capsule Take 1 capsule (50,000 Units total) by mouth once a week. One capsule once weekly 12 capsule 3  . escitalopram (LEXAPRO) 20 MG tablet Take 1 tablet by mouth once daily 30 tablet 0  . HYDROcodone-homatropine (HYCODAN) 5-1.5 MG/5ML syrup Take 5 ml at bedtime by mouth for cough, as needed 120 mL 0  . meclizine (ANTIVERT) 25 MG tablet Take 1 tablet (25 mg total) by mouth 3 (three) times daily as needed for dizziness. 20 tablet 0  . montelukast (SINGULAIR) 10 MG tablet Take 1 tablet (10 mg total) by mouth at bedtime. 30 tablet 1  . mupirocin ointment (BACTROBAN) 2 % Apply twice daily to affected area for  7 to 10 days, then as needed 30 g 0  . potassium chloride SA (KLOR-CON) 20 MEQ tablet Take 1 tablet (20 mEq total) by mouth daily. 90 tablet 3   No current facility-administered medications for this visit.      Musculoskeletal: Strength & Muscle Tone: unable to assess due to telemed visit Gait & Station: unable to assess due to telemed visit Patient leans: unable to assess due to telemed visit  Psychiatric Specialty Exam: Review of Systems  There were no vitals taken for this visit.There is no height or weight on file to calculate BMI.  General Appearance: unable to assess due to phone visit  Eye Contact:  unable to assess due to phone visit  Speech:  Clear and Coherent and Normal Rate  Volume:  Normal  Mood: euthymic  Affect:  Congruent  Thought Process:  Goal Directed and Linear  Orientation:  Full (Time, Place, and Person)  Thought Content: Logical   Suicidal Thoughts:  No  Homicidal Thoughts:  No  Memory:  Recent;   Fair Remote;   Good  Judgement:  Fair  Insight:  Fair  Psychomotor Activity:  Normal  Concentration:  Concentration: Good and Attention Span: Fair  Recall:  Good  Fund of Knowledge: Good  Language: Good  Akathisia:  Unable to assess, virtual visit  Handed:  Right  AIMS (if indicated): not done  Assets:  Communication Skills Desire for Improvement Financial Resources/Insurance Housing Social Support  ADL's:  Intact  Cognition: WNL  Sleep:  Fair   Screenings: GAD-7     Office Visit from 03/17/2019 in [facility] Counselor from 10/09/2018 in [place] [facility] Follow Up from 08/04/2018 in [place] [facility] Follow Up from 07/16/2018 in [facility] Office Visit from 07/09/2018 in [facility]  Total GAD-7 Score 5 19 12 12 15     PHQ2-9     Office Visit from 09/11/2020 in [facility] Video Visit from 09/05/2020 in [facility] Video Visit from 08/21/2020 in [facility] Video Visit from 08/08/2020 in [facility] Visit from 07/15/2019 in [facility]  PHQ-2 Total Score 1 3 3  0 0  PHQ-9 Total  Score -- 12 9 0 --       Assessment and Plan: Patient is currently sick with bronchitis but reports her mood has been stable.  1. MDD (major depressive disorder), recurrent, in partial remission ([facility])  - escitalopram (LEXAPRO) 20 MG tablet; Take 1 tablet (20 mg total) by mouth daily.  Dispense: 30 tablet; Refill: 1  2. Anxiety  - ALPRAZolam (XANAX) 0.25 MG tablet; Take 1 tablet (0.25 mg total) by mouth 2 (two) times daily as needed for anxiety.  Dispense: 60 tablet; Refill: 1 - ALPRAZolam (XANAX) 1 MG tablet; Take 1 tablet (1 mg total) by mouth every evening.  Dispense: 30 tablet; Refill: 1 - escitalopram (LEXAPRO) 20 MG tablet; Take 1 tablet (20 mg total) by mouth daily.  Dispense: 30 tablet; Refill: 1   Continue same regimen. F/up in 2 months.  [place] Crane, MD 09/13/2020, 8:53 AM

## 2020-09-13 NOTE — Assessment & Plan Note (Signed)
4 week h/o disabling cough, Pulmonary eval and management, rept covid test, hycodan at bedtime , chest scan pending

## 2020-09-13 NOTE — Assessment & Plan Note (Signed)
1 month h/o vertigo daily, ENT to eval and manage asap

## 2020-09-13 NOTE — Assessment & Plan Note (Signed)
Uncontrolled , increase amlodipine dose to 10 mg daily DASH diet and commitment to daily physical activity for a minimum of 30 minutes discussed and encouraged, as a part of hypertension management. The importance of attaining a healthy weight is also discussed.  BP/Weight 09/11/2020 09/05/2020 08/21/2020 08/08/2020 08/06/2020 11/04/2019 26/01/7857  Systolic BP 850 - 277 412 878 676 720  Diastolic BP 947 - 98 95 95 80 78  Wt. (Lbs) 256 233 262 262 262 243 -  BMI 42.6 37.61 42.29 42.29 42.29 39.22 -  Some encounter information is confidential and restricted. Go to Review Flowsheets activity to see all data.

## 2020-09-13 NOTE — Assessment & Plan Note (Signed)
Cough x 1 month, worsening with sore throat, hoarseness, fatigue , headache. covid test  Negative. Unable to work from symptom onset Short course of prednisone and albuterol MDI Chest scan and sinus X ray Pulmonary eval

## 2020-09-13 NOTE — Assessment & Plan Note (Signed)
1 week h/o loss of voice related to excess cough, ENT to eval and manage

## 2020-09-13 NOTE — Assessment & Plan Note (Signed)
Managed by Psych and improving ?

## 2020-09-14 ENCOUNTER — Other Ambulatory Visit: Payer: Self-pay

## 2020-09-14 ENCOUNTER — Other Ambulatory Visit: Payer: Self-pay | Admitting: Family Medicine

## 2020-09-14 ENCOUNTER — Ambulatory Visit (HOSPITAL_COMMUNITY)
Admission: RE | Admit: 2020-09-14 | Discharge: 2020-09-14 | Disposition: A | Discharge status: Home / Self Care | Payer: PRIVATE HEALTH INSURANCE | Source: Ambulatory Visit | Attending: Family Medicine | Admitting: Family Medicine

## 2020-09-14 ENCOUNTER — Ambulatory Visit (HOSPITAL_COMMUNITY): Payer: PRIVATE HEALTH INSURANCE

## 2020-09-14 DIAGNOSIS — R7989 Other specified abnormal findings of blood chemistry: Secondary | ICD-10-CM | POA: Insufficient documentation

## 2020-09-14 DIAGNOSIS — E042 Nontoxic multinodular goiter: Secondary | ICD-10-CM

## 2020-09-14 NOTE — Progress Notes (Unsigned)
amb ent  

## 2020-09-15 DIAGNOSIS — Z8489 Family history of other specified conditions: Secondary | ICD-10-CM

## 2020-09-15 LAB — TSH+FREE T4
Free T4: 1.12 ng/dL (ref 0.82–1.77)
TSH: 0.279 u[IU]/mL — ABNORMAL LOW (ref 0.450–4.500)

## 2020-09-15 LAB — SPECIMEN STATUS REPORT

## 2020-09-15 LAB — T3, FREE: T3, Free: 2.6 pg/mL (ref 2.0–4.4)

## 2020-09-20 ENCOUNTER — Other Ambulatory Visit: Payer: Self-pay | Admitting: Psychiatry

## 2020-09-20 ENCOUNTER — Telehealth: Payer: Self-pay

## 2020-09-20 DIAGNOSIS — F3341 Major depressive disorder, recurrent, in partial remission: Secondary | ICD-10-CM

## 2020-09-20 DIAGNOSIS — F419 Anxiety disorder, unspecified: Secondary | ICD-10-CM

## 2020-09-20 NOTE — Telephone Encounter (Signed)
Was prescribed 120 cc cough syrup with 5 cc daily, no refill at this time. Referral is authorized, you may give her name of MD office and number to call or Joellen Jersey may have the info ( I see this in the referral info on her)

## 2020-09-20 NOTE — Telephone Encounter (Signed)
Patient called wanting to know about her ENT referral. Advised patient of date and time of referral.   **patient was only allowed 7 days worth of cough syrup. Please send in refill to [facility] in [place]**

## 2020-09-21 ENCOUNTER — Telehealth: Payer: 59 | Admitting: Family Medicine

## 2020-09-21 ENCOUNTER — Other Ambulatory Visit: Payer: Self-pay | Admitting: Family Medicine

## 2020-09-21 NOTE — Telephone Encounter (Signed)
Patient states the pharmacy would only allow her to get a weeks worth of the cough syrup due to the STOP law. The referral info has been given to the patient.

## 2020-09-26 ENCOUNTER — Other Ambulatory Visit: Payer: Self-pay | Admitting: Family Medicine

## 2020-09-26 MED ORDER — DEXTROMETHORPHAN HBR 15 MG/5ML PO SYRP: 10.0000 mL | ORAL_SOLUTION | Freq: Three times a day (TID) | ORAL | 0 refills | Status: DC | PRN

## 2020-09-26 NOTE — Telephone Encounter (Signed)
I tried to speak with pt, unable , no message to be left. I have prescribed dextromethorphan , cough suppressant syrup for her

## 2020-09-27 ENCOUNTER — Ambulatory Visit (HOSPITAL_COMMUNITY): Payer: PRIVATE HEALTH INSURANCE

## 2020-09-27 NOTE — Telephone Encounter (Signed)
Called patient to let her know about cough medication change. No answer, vm full and unable to leave message.

## 2020-09-28 NOTE — Telephone Encounter (Signed)
Patient aware of change with verbal understanding.

## 2020-10-04 ENCOUNTER — Other Ambulatory Visit (HOSPITAL_COMMUNITY)
Admission: RE | Admit: 2020-10-04 | Discharge: 2020-10-04 | Disposition: A | Discharge status: Home / Self Care | Payer: PRIVATE HEALTH INSURANCE | Source: Ambulatory Visit | Attending: Internal Medicine | Admitting: Internal Medicine

## 2020-10-04 ENCOUNTER — Other Ambulatory Visit: Payer: Self-pay

## 2020-10-04 ENCOUNTER — Ambulatory Visit (HOSPITAL_COMMUNITY)
Admission: RE | Admit: 2020-10-04 | Discharge: 2020-10-04 | Disposition: A | Discharge status: Home / Self Care | Payer: PRIVATE HEALTH INSURANCE | Source: Ambulatory Visit | Attending: Internal Medicine | Admitting: Internal Medicine

## 2020-10-04 ENCOUNTER — Ambulatory Visit (INDEPENDENT_AMBULATORY_CARE_PROVIDER_SITE_OTHER): Payer: PRIVATE HEALTH INSURANCE | Admitting: Internal Medicine

## 2020-10-04 ENCOUNTER — Other Ambulatory Visit: Payer: Self-pay | Admitting: Otolaryngology

## 2020-10-04 ENCOUNTER — Encounter: Payer: Self-pay | Admitting: Internal Medicine

## 2020-10-04 DIAGNOSIS — E042 Nontoxic multinodular goiter: Secondary | ICD-10-CM

## 2020-10-04 DIAGNOSIS — R0600 Dyspnea, unspecified: Secondary | ICD-10-CM | POA: Insufficient documentation

## 2020-10-04 DIAGNOSIS — R0609 Other forms of dyspnea: Secondary | ICD-10-CM

## 2020-10-04 DIAGNOSIS — R058 Other specified cough: Secondary | ICD-10-CM | POA: Diagnosis not present

## 2020-10-04 LAB — BASIC METABOLIC PANEL
Anion gap: 7 (ref 5–15)
BUN: 12 mg/dL (ref 6–20)
CO2: 29 mmol/L (ref 22–32)
Calcium: 8.8 mg/dL — ABNORMAL LOW (ref 8.9–10.3)
Chloride: 103 mmol/L (ref 98–111)
Creatinine, Ser: 0.79 mg/dL (ref 0.44–1.00)
GFR, Estimated: >60 mL/min (ref >60)
Glucose, Bld: 91 mg/dL (ref 70–99)
Potassium: 3.3 mmol/L — ABNORMAL LOW (ref 3.5–5.1)
Sodium: 139 mmol/L (ref 135–145)

## 2020-10-04 LAB — CBC WITH DIFFERENTIAL/PLATELET
Abs Immature Granulocytes: 0.02 10*3/uL (ref 0.00–0.07)
Basophils Absolute: 0 10*3/uL (ref 0.0–0.1)
Basophils Relative: 0 %
Eosinophils Absolute: 0.4 10*3/uL (ref 0.0–0.5)
Eosinophils Relative: 6 %
HCT: 41.8 % (ref 36.0–46.0)
Hemoglobin: 13.1 g/dL (ref 12.0–15.0)
Immature Granulocytes: 0 %
Lymphocytes Relative: 36 %
Lymphs Abs: 2.3 10*3/uL (ref 0.7–4.0)
MCH: 31.8 pg (ref 26.0–34.0)
MCHC: 31.3 g/dL (ref 30.0–36.0)
MCV: 101.5 fL — ABNORMAL HIGH (ref 80.0–100.0)
Monocytes Absolute: 0.4 10*3/uL (ref 0.1–1.0)
Monocytes Relative: 6 %
Neutro Abs: 3.4 10*3/uL (ref 1.7–7.7)
Neutrophils Relative %: 52 %
Platelets: 374 10*3/uL (ref 150–400)
RBC: 4.12 MIL/uL (ref 3.87–5.11)
RDW: 12.4 % (ref 11.5–15.5)
WBC: 6.5 10*3/uL (ref 4.0–10.5)
nRBC: 0 % (ref 0.0–0.2)

## 2020-10-04 LAB — SEDIMENTATION RATE: Sed Rate: 10 mm/hr (ref 0–22)

## 2020-10-04 LAB — BRAIN NATRIURETIC PEPTIDE: B Natriuretic Peptide: 23 pg/mL (ref 0.0–100.0)

## 2020-10-04 LAB — D-DIMER, QUANTITATIVE: D-Dimer, Quant: 1.09 ug/mL-FEU — ABNORMAL HIGH (ref 0.00–0.50)

## 2020-10-04 MED ORDER — AMOXICILLIN-POT CLAVULANATE 875-125 MG PO TABS: 1.0000 | ORAL_TABLET | Freq: Two times a day (BID) | ORAL | 0 refills | Status: AC

## 2020-10-04 NOTE — Assessment & Plan Note (Addendum)
Onset Sept 2021 with viral syndrome - Allergy profile 10/04/2020 >  Eos 0.4 /  IgE pending  Upper airway cough syndrome (previously labeled PNDS),  is so named because it's frequently impossible to sort out how much is  CR/sinusitis with freq throat clearing (which can be related to primary GERD)   vs  causing  secondary (" extra esophageal")  GERD from wide swings in gastric pressure that occur with throat clearing, often  promoting self use of mint and menthol lozenges that reduce the lower esophageal sphincter tone and exacerbate the problem further in a cyclical fashion.   These are the same pts (now being labeled as having "irritable larynx syndrome" by some cough centers) who not infrequently have a history of having failed to tolerate ace inhibitors,  dry powder inhalers or biphosphonates or report having atypical/extraesophageal reflux symptoms that don't respond to standard doses of PPI  and are easily confused as having aecopd or asthma flares by even experienced allergists/ pulmonologists (myself included).   rx augmentin x 10 days and either f/u with ent or sinus CT next step if not improving

## 2020-10-04 NOTE — Patient Instructions (Signed)
Augmentin 875 mg take one pill twice daily  X 10 days - take at breakfast and supper with large glass of water.  It would help reduce the usual side effects (diarrhea and yeast infections) if you ate cultured yogurt at lunch.    Please remember to go to the lab and x-ray department at [facility]   for your tests - we will call you with the results when they are available with additional recommendations as indicated       Please schedule a follow up office visit in 4 weeks, sooner if needed

## 2020-10-04 NOTE — Progress Notes (Signed)
Nigel Bridgeman, female    DOB: 1966-08-28    MRN: [id number]   Brief patient profile:  37 yobf never smoker healthy x for wt issues with peak wt 362 then gastric bypass in [place] and then September 2021 with rhinitis/ HA / loss of smell/ cough/ sob with neg covid and never back to baseline so referred to pulmonary clinic in [place]  10/04/2020 by Dr Gerline Legacy    History of Present Illness  10/04/2020  Pulmonary/ 1st office eval/Tiari Andringa  Chief Complaint  Patient presents with  . Pulmonary Consult    Referred by Dr. Tula Nakayama. Pt c/o SOB and dizziness since 08/04/20.  She states she can have these symptoms with or without exertion. She also c/o cough with yellow sputum for the past 3 wks. She uses her albuterol inhaler about 3 x per wk.   Dyspnea: loses breath talking / MMRC3 = can't walk 100 yards even at a slow pace at a flat grade s stopping due to sob  Cough: after supper / keeps her up up most nights / mucus discolored / ha worse noct and in am - eval 09/21/20 by ent but focus was on dizziness Sleep:cough wakes her up / 45 degrees electric bed  SABA use: 3 x daily not helping     No obvious day to day or daytime variability or assoc excess/ purulent sputum or mucus plugs or hemoptysis or cp or chest tightness, subjective wheeze or overt sinus or hb symptoms.     Also denies any obvious fluctuation of symptoms with weather or environmental changes or other aggravating or alleviating factors except as outlined above   No unusual exposure hx or h/o childhood pna/ asthma or knowledge of premature birth.  Current Allergies, Complete Past Medical History, Past Surgical History, Family History, and Social History were reviewed in [employer] record.  ROS  The following are not active complaints unless bolded Hoarseness, sore throat, dysphagia, dental problems, itching, sneezing,  nasal congestion or discharge of excess mucus/ or purulent secretions, ear ache,    fever, chills, sweats, unintended wt loss or wt gain, classically pleuritic or exertional cp,  orthopnea pnd or arm/hand swelling  or leg swelling, presyncope, palpitations, abdominal pain, anorexia, nausea, vomiting, diarrhea  or change in bowel habits or change in bladder habits, change in stools or change in urine, dysuria, hematuria,  rash, arthralgias, visual complaints, headache, numbness, weakness or ataxia or problems with walking or coordination,  change in mood or  memory.             Past Medical History:  Diagnosis Date  . Anemia   . Generalized headaches   . Hypertension     Outpatient Medications Prior to Visit  Medication Sig Dispense Refill  . albuterol (VENTOLIN HFA) 108 (90 Base) MCG/ACT inhaler Inhale 2 puffs into the lungs every 6 (six) hours as needed for wheezing or shortness of breath. 18 g 0  . ALPRAZolam (XANAX) 0.25 MG tablet Take 1 tablet (0.25 mg total) by mouth 2 (two) times daily as needed for anxiety. 60 tablet 1  . ALPRAZolam (XANAX) 1 MG tablet Take 1 tablet (1 mg total) by mouth every evening. 30 tablet 1  . amLODipine (NORVASC) 10 MG tablet Take 1 tablet (10 mg total) by mouth daily. 90 tablet 3  . Cyanocobalamin (B-12) 2500 MCG TABS Take 1 tablet by mouth daily.    . ergocalciferol (VITAMIN D2) 1.25 MG (50000 UT) capsule  Take 1 capsule (50,000 Units total) by mouth once a week. One capsule once weekly 12 capsule 3  . escitalopram (LEXAPRO) 20 MG tablet Take 1 tablet by mouth once daily 30 tablet 0  . meclizine (ANTIVERT) 25 MG tablet Take 1 tablet (25 mg total) by mouth 3 (three) times daily as needed for dizziness. 20 tablet 0  . dextromethorphan 15 MG/5ML syrup Take 10 mLs (30 mg total) by mouth 3 (three) times daily as needed for cough. 300 mL 0  . montelukast (SINGULAIR) 10 MG tablet Take 1 tablet (10 mg total) by mouth at bedtime. 30 tablet 1  . mupirocin ointment (BACTROBAN) 2 % Apply twice daily to affected area for  7 to 10 days, then as needed 30  g 0  . potassium chloride SA (KLOR-CON) 20 MEQ tablet Take 1 tablet (20 mEq total) by mouth daily. (Patient not taking: Reported on 10/04/2020) 90 tablet 3   No facility-administered medications prior to visit.     Objective:     BP 116/88 (BP Location: Left Arm, Cuff Size: Large)   Pulse 74   Temp 97.8 F (36.6 C) (Temporal)   Ht 5\' 7"  (1.702 m)   Wt 250 lb (113.4 kg)   SpO2 99% Comment: on RA  BMI 39.16 kg/m   SpO2: 99 % (on RA)   amb somber obese bf nad   HEENT : pt wearing mask not removed for exam due to covid -19 concerns.    NECK :  without JVD/Nodes/TM/ nl carotid upstrokes bilaterally   LUNGS: no acc muscle use,  Nl contour chest which is clear to A and P bilaterally without cough on insp or exp maneuvers   CV:  RRR  no s3 or murmur or increase in P2, and no edema   ABD:  soft and nontender with nl inspiratory excursion in the supine position. No bruits or organomegaly appreciated, bowel sounds nl  MS:  Nl gait/ ext warm without deformities, calf tenderness, cyanosis or clubbing No obvious joint restrictions   SKIN: warm and dry without lesions    NEURO:  alert, approp, nl sensorium with  no motor or cerebellar deficits apparent.   CXR PA and Lateral:   10/04/2020 :    I personally reviewed images and   impression as follows:   Significant decrease in lung volumes s obvious air space dz or ild.    Labs ordered/ reviewed:      Chemistry      Component Value Date/Time   NA 139 10/04/2020 1618   NA 142 09/06/2020 0843   K 3.3 (L) 10/04/2020 1618   CL 103 10/04/2020 1618   CO2 29 10/04/2020 1618   BUN 12 10/04/2020 1618   BUN 8 09/06/2020 0843   CREATININE 0.79 10/04/2020 1618   CREATININE 0.71 07/10/2018 1015      Component Value Date/Time   CALCIUM 8.8 (L) 10/04/2020 1618   ALKPHOS 52 09/06/2020 0843   AST 14 09/06/2020 0843   ALT 10 09/06/2020 0843   BILITOT 0.6 09/06/2020 0843        Lab Results  Component Value Date   WBC 6.5  10/04/2020   HGB 13.1 10/04/2020   HCT 41.8 10/04/2020   MCV 101.5 (H) 10/04/2020   PLT 374 10/04/2020       EOS  0.4                                    10/04/2020    Lab Results  Component Value Date   DDIMER 1.09 (H) 10/04/2020      Lab Results  Component Value Date   TSH 0.278 (L) 09/06/2020   TSH 0.279 (L) 09/06/2020      BNP   10/04/2020    23.0     Lab Results  Component Value Date   ESRSEDRATE 10 10/04/2020     Labs ordered 10/04/2020  :  allergy profile       Assessment   Upper airway cough syndrome Onset Sept 2021 with viral syndrome  - Allergy profile 10/04/2020 >  Eos 0.4 /  IgE pending  Upper airway cough syndrome (previously labeled PNDS),  is so named because it's frequently impossible to sort out how much is  CR/sinusitis with freq throat clearing (which can be related to primary GERD)   vs  causing  secondary (" extra esophageal")  GERD from wide swings in gastric pressure that occur with throat clearing, often  promoting self use of mint and menthol lozenges that reduce the lower esophageal sphincter tone and exacerbate the problem further in a cyclical fashion.   These are the same pts (now being labeled as having "irritable larynx syndrome" by some cough centers) who not infrequently have a history of having failed to tolerate ace inhibitors,  dry powder inhalers or biphosphonates or report having atypical/extraesophageal reflux symptoms that don't respond to standard doses of PPI  and are easily confused as having aecopd or asthma flares by even experienced allergists/ pulmonologists (myself included).   rx augmentin x 10 days and either f/u with ent or sinus CT next step if not improving     DOE (dyspnea on exertion) Onset 07/2020 with covid like illness s/p J&J  01/2020  -  10/04/2020   Walked RA  approx   300 ft  @ fast pace  stopped due to  Weak and dizzy and sob with sats 96%     - d  dimer 1.02 10/04/2020 > rec CTa to be complete  Symptoms are markedly disproportionate to objective findings and not clear to what extent this is actually a pulmonary  problem but pt does appear to have difficult to sort out respiratory symptoms of unknown origin for which  DDX  = almost all start with A and  include Adherence, Ace Inhibitors, Acid Reflux, Active Sinus Disease, Alpha 1 Antitripsin deficiency, Anxiety masquerading as Airways dz,  ABPA,  Allergy(esp in young), Aspiration (esp in elderly), Adverse effects of meds,  Active smoking or Vaping, A bunch of PE's/clot burden (a few small clots can't cause this syndrome unless there is already severe underlying pulm or vascular dz with poor reserve),  Anemia or thyroid disorder, plus two Bs  = Bronchiectasis and Beta blocker use..and one C= CHF     Adherence is always the initial "prime suspect" and is a multilayered concern that requires a "trust but verify" approach in every patient - starting with knowing how to use medications, especially inhalers, correctly, keeping up with refills and understanding the fundamental difference between maintenance and prns vs those medications only taken for a very short course and then stopped and not refilled.   ? Active sinus dz >  augmentin x 10 days/ sinus ct later if symptoms persist/  ent f/u prn   ? Anxiety/depression /deconditioning  > usually at the bottom of this list of usual suspects but   and may interfere with adherence and also interpretation of response or lack thereof to symptom management which can be quite subjective.   ? A bunch of PEs > can't rule out and she is at risk based on obesity/ sedentary lifestyle s/p covid like infection  and cxr shows no obvious reason she should be so sob.   Adverse drug effects > none of the usual suspects listed   ? Allergy/ asthma > note elevated eos > hold saba for now and check IgE - consider pfts later  ? Anemia/ thyroid disorder > ruled out by labs    CHF ruled out with bnp so low           Each maintenance medication was reviewed in detail including emphasizing most importantly the difference between maintenance and prns and under what circumstances the prns are to be triggered using an action plan format where appropriate.  Total time for H and P, chart review, counseling,  directly observing portions of ambulatory 02 saturation study/  and generating customized AVS unique to this office visit / charting = 62 min            Christinia Gully, MD 10/04/2020

## 2020-10-04 NOTE — Assessment & Plan Note (Addendum)
Onset 07/2020 with covid like illness s/p J&J  01/2020  -  10/04/2020   Walked RA  approx   300 ft  @ fast pace  stopped due to  Weak and dizzy and sob with sats 96%   - Allergy profile 10/04/2020 >  Eos 0.4 /  IgE pending   - d dimer 1.02 10/04/2020 > rec CTa to be complete  Symptoms are markedly disproportionate to objective findings and not clear to what extent this is actually a pulmonary  problem but pt does appear to have difficult to sort out respiratory symptoms of unknown origin for which  DDX  = almost all start with A and  include Adherence, Ace Inhibitors, Acid Reflux, Active Sinus Disease, Alpha 1 Antitripsin deficiency, Anxiety masquerading as Airways dz,  ABPA,  Allergy(esp in young), Aspiration (esp in elderly), Adverse effects of meds,  Active smoking or Vaping, A bunch of PE's/clot burden (a few small clots can't cause this syndrome unless there is already severe underlying pulm or vascular dz with poor reserve),  Anemia or thyroid disorder, plus two Bs  = Bronchiectasis and Beta blocker use..and one C= CHF     Adherence is always the initial "prime suspect" and is a multilayered concern that requires a "trust but verify" approach in every patient - starting with knowing how to use medications, especially inhalers, correctly, keeping up with refills and understanding the fundamental difference between maintenance and prns vs those medications only taken for a very short course and then stopped and not refilled.   ? Active sinus dz >  augmentin x 10 days/ sinus ct later if symptoms persist/ ent f/u prn   ? Anxiety/depression /deconditioning  > usually at the bottom of this list of usual suspects but   and may interfere with adherence and also interpretation of response or lack thereof to symptom management which can be quite subjective.   ? A bunch of PEs > can't rule out and she is at risk based on obesity/ sedentary lifestyle s/p covid like infection  and cxr shows no obvious reason  she should be so sob.   Adverse drug effects > none of the usual suspects listed   ? Allergy/ asthma > note elevated eos > hold saba for now and check IgE - consider pfts later  ? Anemia/ thyroid disorder > ruled out by labs   CHF ruled out with bnp so low           Each maintenance medication was reviewed in detail including emphasizing most importantly the difference between maintenance and prns and under what circumstances the prns are to be triggered using an action plan format where appropriate.  Total time for H and P, chart review, counseling,  directly observing portions of ambulatory 02 saturation study/  and generating customized AVS unique to this office visit / charting = 62 min

## 2020-10-05 ENCOUNTER — Other Ambulatory Visit: Payer: Self-pay | Admitting: Internal Medicine

## 2020-10-05 DIAGNOSIS — R0600 Dyspnea, unspecified: Secondary | ICD-10-CM

## 2020-10-05 DIAGNOSIS — R0609 Other forms of dyspnea: Secondary | ICD-10-CM

## 2020-10-05 NOTE — Progress Notes (Signed)
Pt notified of results and CTA was ordered.

## 2020-10-06 LAB — IGE: IgE (Immunoglobulin E), Serum: 6 IU/mL (ref 6–495)

## 2020-10-10 ENCOUNTER — Other Ambulatory Visit: Payer: Self-pay

## 2020-10-10 ENCOUNTER — Ambulatory Visit (HOSPITAL_COMMUNITY)
Admission: RE | Admit: 2020-10-10 | Discharge: 2020-10-10 | Disposition: A | Discharge status: Home / Self Care | Payer: 59 | Source: Ambulatory Visit | Attending: Internal Medicine | Admitting: Internal Medicine

## 2020-10-10 DIAGNOSIS — R0609 Other forms of dyspnea: Secondary | ICD-10-CM

## 2020-10-10 DIAGNOSIS — R0600 Dyspnea, unspecified: Secondary | ICD-10-CM | POA: Insufficient documentation

## 2020-10-10 MED ORDER — IOHEXOL 350 MG/ML SOLN
100.0000 mL | Freq: Once | INTRAVENOUS | Status: AC | PRN
  Administered 2020-10-10: 100 mL via INTRAVENOUS

## 2020-10-19 ENCOUNTER — Other Ambulatory Visit (HOSPITAL_COMMUNITY)
Admission: RE | Admit: 2020-10-19 | Discharge: 2020-10-19 | Disposition: A | Discharge status: Home / Self Care | Payer: PRIVATE HEALTH INSURANCE | Source: Ambulatory Visit | Attending: Physician Assistant | Admitting: Physician Assistant

## 2020-10-19 ENCOUNTER — Ambulatory Visit
Admission: RE | Admit: 2020-10-19 | Discharge: 2020-10-19 | Disposition: A | Discharge status: Home / Self Care | Payer: PRIVATE HEALTH INSURANCE | Source: Ambulatory Visit | Attending: Otolaryngology | Admitting: Otolaryngology

## 2020-10-19 DIAGNOSIS — E042 Nontoxic multinodular goiter: Secondary | ICD-10-CM | POA: Insufficient documentation

## 2020-10-19 NOTE — Procedures (Signed)
PROCEDURE SUMMARY:  Using direct ultrasound guidance, 5 passes were made using 25 g needles into the nodule within the right lobe of the thyroid.   Ultrasound was used to confirm needle placements on all occasions.   EBL = trace  Specimens were sent to Pathology for analysis.  See procedure note under Imaging tab in Epic for full procedure details.  Mina Carlisi S Zayn Selley PA-C 10/19/2020 9:18 AM

## 2020-10-20 ENCOUNTER — Telehealth: Payer: Self-pay | Admitting: *Deleted

## 2020-10-20 LAB — CYTOLOGY - NON PAP

## 2020-10-20 NOTE — Telephone Encounter (Signed)
Pt called she has an appt to go over test results with you on 12-9. She said you had taken her out of work until she comes back to see you. She thought the appt was on 10-23-20 and her return to work is 10-24-20. She wanted to know if you wanted her to return 10-24-20 or would you contact her short term disability to return 12-10 after her appt with you?

## 2020-10-21 NOTE — Telephone Encounter (Signed)
May return to work on the 7th, I have reviewed her records and she has additional upcoming tests etc, but from notes I believe sh is able to return on the 7 th as originally planned, keep appt on the 9th, please let her know

## 2020-10-23 NOTE — Telephone Encounter (Signed)
Pt advised with verbal understanding  °

## 2020-10-26 ENCOUNTER — Telehealth: Payer: Self-pay | Admitting: Family Medicine

## 2020-10-26 ENCOUNTER — Encounter: Payer: Self-pay | Admitting: Family Medicine

## 2020-10-26 ENCOUNTER — Other Ambulatory Visit: Payer: Self-pay

## 2020-10-26 ENCOUNTER — Telehealth (INDEPENDENT_AMBULATORY_CARE_PROVIDER_SITE_OTHER): Payer: Self-pay | Admitting: Family Medicine

## 2020-10-26 VITALS — Ht 67.0 in | Wt 250.8 lb

## 2020-10-26 DIAGNOSIS — G43009 Migraine without aura, not intractable, without status migrainosus: Secondary | ICD-10-CM

## 2020-10-26 DIAGNOSIS — R110 Nausea: Secondary | ICD-10-CM

## 2020-10-26 MED ORDER — ONDANSETRON HCL 4 MG PO TABS: 4.0000 mg | ORAL_TABLET | Freq: Three times a day (TID) | ORAL | 1 refills | Status: DC | PRN

## 2020-10-26 MED ORDER — TOPIRAMATE 50 MG PO TABS: 50.0000 mg | ORAL_TABLET | Freq: Two times a day (BID) | ORAL | 3 refills | Status: DC

## 2020-10-26 MED ORDER — RIZATRIPTAN BENZOATE 10 MG PO TABS: 10.0000 mg | ORAL_TABLET | ORAL | 1 refills | Status: DC | PRN

## 2020-10-26 NOTE — Telephone Encounter (Signed)
Copied Noted Sleeved

## 2020-10-26 NOTE — Progress Notes (Signed)
Virtual Visit via Telephone Note  I connected with Olivia Woodward on 10/26/20 at 11:00 AM EST by telephone and verified that I am speaking with the correct person using two identifiers.  Location: Patient: home Provider: office   I discussed the limitations, risks, security and privacy concerns of performing an evaluation and management service by telephone and the availability of in person appointments. I also discussed with the patient that there may be a patient responsible charge related to this service. The patient expressed understanding and agreed to proceed.   History of Present Illness: Daily dull headache 6 most of the time and gets severe at tinmes with nausea, has migraine history approx 6 years ago Cough much improved and has additional work up    Observations/Objective: Ht 5\' 7"  (1.702 m)   Wt 250 lb 12.8 oz (113.8 kg)   BMI 39.28 kg/m  Good communication with no confusion and intact memory. Alert and oriented x 3 No signs of respiratory distress during speech    Assessment and Plan: Migraine headache Recurrent migraine headache start topamax and maxalt for headache, start diary, follow up in 6 weeks, call for worsenignor unresolved headache  Morbid obesity ([place])  Patient re-educated about  the importance of commitment to a  minimum of 150 minutes of exercise per week as able.  The importance of healthy food choices with portion control discussed, as well as eating regularly and within a 12 hour window most days. The need to choose "clean , green" food 50 to 75% of the time is discussed, as well as to make water the primary drink and set a goal of 64 ounces water daily.    Weight /BMI 10/26/2020 10/04/2020 09/11/2020  WEIGHT 250 lb 12.8 oz 250 lb 256 lb  HEIGHT 5\' 7"  5\' 7"  5\' 5"   BMI 39.28 kg/m2 39.16 kg/m2 42.6 kg/m2  Some encounter information is confidential and restricted. Go to Review Flowsheets activity to see all data.      Nausea Nausea  associuated with headache, zofran prescribed for as needed use    Follow Up Instructions:    I discussed the assessment and treatment plan with the patient. The patient was provided an opportunity to ask questions and all were answered. The patient agreed with the plan and demonstrated an understanding of the instructions.   The patient was advised to call back or seek an in-person evaluation if the symptoms worsen or if the condition fails to improve as anticipated.  I provided 20 minutes of non-face-to-face time during this encounter.   Tula Nakayama, MD

## 2020-10-26 NOTE — Patient Instructions (Addendum)
Physical and pap in office with MD 2nd week in February, call if you need me sooner  Nurse please call patient and schedule appointment date and time for her to get flu vaccine and TdAP  Please reschedule mammogram at checkout   Thankful you feel better  New medications for migraine headache. Topamax, take one twice every day to reduce headache frequency  Take maxalt for bad headache, no more than 2 tablets in 24 hours, no more than twice weekly  Zofran is prescribed for nausea  Start headache diary , and avoid triggers when you identify them   Migraine Headache A migraine headache is a very strong throbbing pain on one side or both sides of your head. This type of headache can also cause other symptoms. It can last from 4 hours to 3 days. Talk with your doctor about what things may bring on (trigger) this condition. What are the causes? The exact cause of this condition is not known. This condition may be triggered or caused by:  Drinking alcohol.  Smoking.  Taking medicines, such as: ? Medicine used to treat chest pain (nitroglycerin). ? Birth control pills. ? Estrogen. ? Some blood pressure medicines.  Eating or drinking certain products.  Doing physical activity. Other things that may trigger a migraine headache include:  Having a menstrual period.  Pregnancy.  Hunger.  Stress.  Not getting enough sleep or getting too much sleep.  Weather changes.  Tiredness (fatigue). What increases the risk?  Being 65-23 years old.  Being female.  Having a family history of migraine headaches.  Being Caucasian.  Having depression or anxiety.  Being very overweight. What are the signs or symptoms?  A throbbing pain. This pain may: ? Happen in any area of the head, such as on one side or both sides. ? Make it hard to do daily activities. ? Get worse with physical activity. ? Get worse around bright lights or loud noises.  Other symptoms may  include: ? Feeling sick to your stomach (nauseous). ? Vomiting. ? Dizziness. ? Being sensitive to bright lights, loud noises, or smells.  Before you get a migraine headache, you may get warning signs (an aura). An aura may include: ? Seeing flashing lights or having blind spots. ? Seeing bright spots, halos, or zigzag lines. ? Having tunnel vision or blurred vision. ? Having numbness or a tingling feeling. ? Having trouble talking. ? Having weak muscles.  Some people have symptoms after a migraine headache (postdromal phase), such as: ? Tiredness. ? Trouble thinking (concentrating). How is this treated?  Taking medicines that: ? Relieve pain. ? Relieve the feeling of being sick to your stomach. ? Prevent migraine headaches.  Treatment may also include: ? Having acupuncture. ? Avoiding foods that bring on migraine headaches. ? Learning ways to control your body functions (biofeedback). ? Therapy to help you know and deal with negative thoughts (cognitive behavioral therapy). Follow these instructions at home: Medicines  Take over-the-counter and prescription medicines only as told by your doctor.  Ask your doctor if the medicine prescribed to you: ? Requires you to avoid driving or using heavy machinery. ? Can cause trouble pooping (constipation). You may need to take these steps to prevent or treat trouble pooping:  Drink enough fluid to keep your pee (urine) pale yellow.  Take over-the-counter or prescription medicines.  Eat foods that are high in fiber. These include beans, whole grains, and fresh fruits and vegetables.  Limit foods that are high in fat  and sugar. These include fried or sweet foods. Lifestyle  Do not drink alcohol.  Do not use any products that contain nicotine or tobacco, such as cigarettes, e-cigarettes, and chewing tobacco. If you need help quitting, ask your doctor.  Get at least 8 hours of sleep every night.  Limit and deal with  stress. General instructions      Keep a journal to find out what may bring on your migraine headaches. For example, write down: ? What you eat and drink. ? How much sleep you get. ? Any change in what you eat or drink. ? Any change in your medicines.  If you have a migraine headache: ? Avoid things that make your symptoms worse, such as bright lights. ? It may help to lie down in a dark, quiet room. ? Do not drive or use heavy machinery. ? Ask your doctor what activities are safe for you.  Keep all follow-up visits as told by your doctor. This is important. Contact a doctor if:  You get a migraine headache that is different or worse than others you have had.  You have more than 15 headache days in one month. Get help right away if:  Your migraine headache gets very bad.  Your migraine headache lasts longer than 72 hours.  You have a fever.  You have a stiff neck.  You have trouble seeing.  Your muscles feel weak or like you cannot control them.  You start to lose your balance a lot.  You start to have trouble walking.  You pass out (faint).  You have a seizure. Summary  A migraine headache is a very strong throbbing pain on one side or both sides of your head. These headaches can also cause other symptoms.  This condition may be treated with medicines and changes to your lifestyle.  Keep a journal to find out what may bring on your migraine headaches.  Contact a doctor if you get a migraine headache that is different or worse than others you have had.  Contact your doctor if you have more than 15 headache days in a month. This information is not intended to replace advice given to you by your health care provider. Make sure you discuss any questions you have with your health care provider. Document Revised: 02/26/2019 Document Reviewed: 12/17/2018 Elsevier Patient Education  [place].

## 2020-10-30 ENCOUNTER — Encounter: Payer: Self-pay | Admitting: Family Medicine

## 2020-10-30 DIAGNOSIS — R110 Nausea: Secondary | ICD-10-CM | POA: Insufficient documentation

## 2020-10-30 DIAGNOSIS — G43909 Migraine, unspecified, not intractable, without status migrainosus: Secondary | ICD-10-CM | POA: Insufficient documentation

## 2020-10-30 NOTE — Assessment & Plan Note (Signed)
Recurrent migraine headache start topamax and maxalt for headache, start diary, follow up in 6 weeks, call for worsenignor unresolved headache

## 2020-10-30 NOTE — Assessment & Plan Note (Signed)
°  Patient re-educated about  the importance of commitment to a  minimum of 150 minutes of exercise per week as able.  The importance of healthy food choices with portion control discussed, as well as eating regularly and within a 12 hour window most days. The need to choose "clean , green" food 50 to 75% of the time is discussed, as well as to make water the primary drink and set a goal of 64 ounces water daily.    Weight /BMI 10/26/2020 10/04/2020 09/11/2020  WEIGHT 250 lb 12.8 oz 250 lb 256 lb  HEIGHT 5\' 7"  5\' 7"  5\' 5"   BMI 39.28 kg/m2 39.16 kg/m2 42.6 kg/m2  Some encounter information is confidential and restricted. Go to Review Flowsheets activity to see all data.

## 2020-10-30 NOTE — Assessment & Plan Note (Signed)
Nausea associuated with headache, zofran prescribed for as needed use

## 2020-10-31 ENCOUNTER — Ambulatory Visit: Payer: PRIVATE HEALTH INSURANCE | Admitting: Internal Medicine

## 2020-10-31 NOTE — Progress Notes (Deleted)
Olivia Woodward, female    DOB: June 25, 1966    MRN: [id number]   Brief patient profile:  3 yobf never smoker healthy x for wt issues with peak wt 362 then gastric bypass in [place] and then September 2021 with rhinitis/ HA / loss of smell/ cough/ sob with neg covid and never back to baseline so referred to pulmonary clinic in [place]  10/04/2020 by Dr Gerline Woodward    History of Present Illness  10/04/2020  Pulmonary/ 1st Woodward eval/Olivia Woodward  Chief Complaint  Patient presents with  . Pulmonary Consult    Referred by Dr. Tula Woodward. Pt c/o SOB and dizziness since 08/04/20.  She states she can have these symptoms with or without exertion. She also c/o cough with yellow sputum for the past 3 wks. She uses her albuterol inhaler about 3 x per wk.   Dyspnea: loses breath talking / MMRC3 = can't walk 100 yards even at a slow pace at a flat grade s stopping due to sob  Cough: after supper / keeps her up up most nights / mucus discolored / ha worse noct and in am - eval 09/21/20 by ent but focus was on dizziness Sleep:cough wakes her up / 45 degrees electric bed  SABA use: 3 x daily not helping   rec Augmentin 875 mg take one pill twice daily  X 10 days      10/31/2020  f/u ov/Olivia Woodward/Olivia Woodward re:  No chief complaint on file.  Upper airway cough syndrome Onset Sept 2021 with viral syndrome  - Allergy profile 10/04/2020 >  Eos 0.4 /  IgE 6 DOE (dyspnea on exertion)  Dyspnea:  *** Cough: *** Sleeping: *** SABA use: *** 02: ***   No obvious day to day or daytime variability or assoc excess/ purulent sputum or mucus plugs or hemoptysis or cp or chest tightness, subjective wheeze or overt sinus or hb symptoms.   *** without nocturnal  or early am exacerbation  of respiratory  c/o's or need for noct saba. Also denies any obvious fluctuation of symptoms with weather or environmental changes or other aggravating or alleviating factors except as outlined above   No unusual exposure  hx or h/o childhood pna/ asthma or knowledge of premature birth.  Current Allergies, Complete Past Medical History, Past Surgical History, Family History, and Social History were reviewed in [employer] record.  ROS  The following are not active complaints unless bolded Hoarseness, sore throat, dysphagia, dental problems, itching, sneezing,  nasal congestion or discharge of excess mucus or purulent secretions, ear ache,   fever, chills, sweats, unintended wt loss or wt gain, classically pleuritic or exertional cp,  orthopnea pnd or arm/hand swelling  or leg swelling, presyncope, palpitations, abdominal pain, anorexia, nausea, vomiting, diarrhea  or change in bowel habits or change in bladder habits, change in stools or change in urine, dysuria, hematuria,  rash, arthralgias, visual complaints, headache, numbness, weakness or ataxia or problems with walking or coordination,  change in mood or  memory.        No outpatient medications have been marked as taking for the 10/31/20 encounter (Appointment) with Olivia Rockers, MD.              Past Medical History:  Diagnosis Date  . Anemia   . Generalized headaches   . Hypertension       Objective:     Wt Readings from Last 3 Encounters:  10/26/20 250 lb 12.8  oz (113.8 kg)  10/04/20 250 lb (113.4 kg)  09/11/20 256 lb (116.1 kg)     Vital signs reviewed - Note on arrival 10/31/2020  02 sats  ***% on ***               Assessment

## 2020-11-05 IMAGING — DX CERVICAL SPINE - COMPLETE 4+ VIEW
6 series · 6 of 6 positions shown · non-contrast
Comparison: None.

CLINICAL DATA: Three days of severe neck pain radiating to the
shoulders, no known injury

EXAM:
CERVICAL SPINE - COMPLETE 4+ VIEW

[c-spine lat]
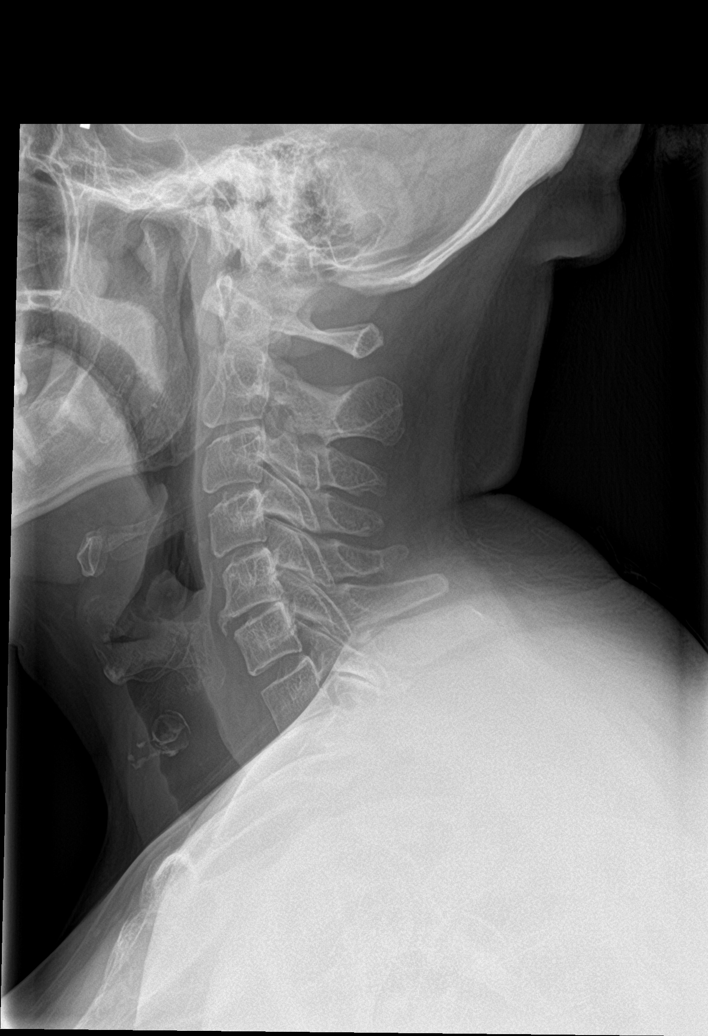

[c-spine obl (1 of 2)]
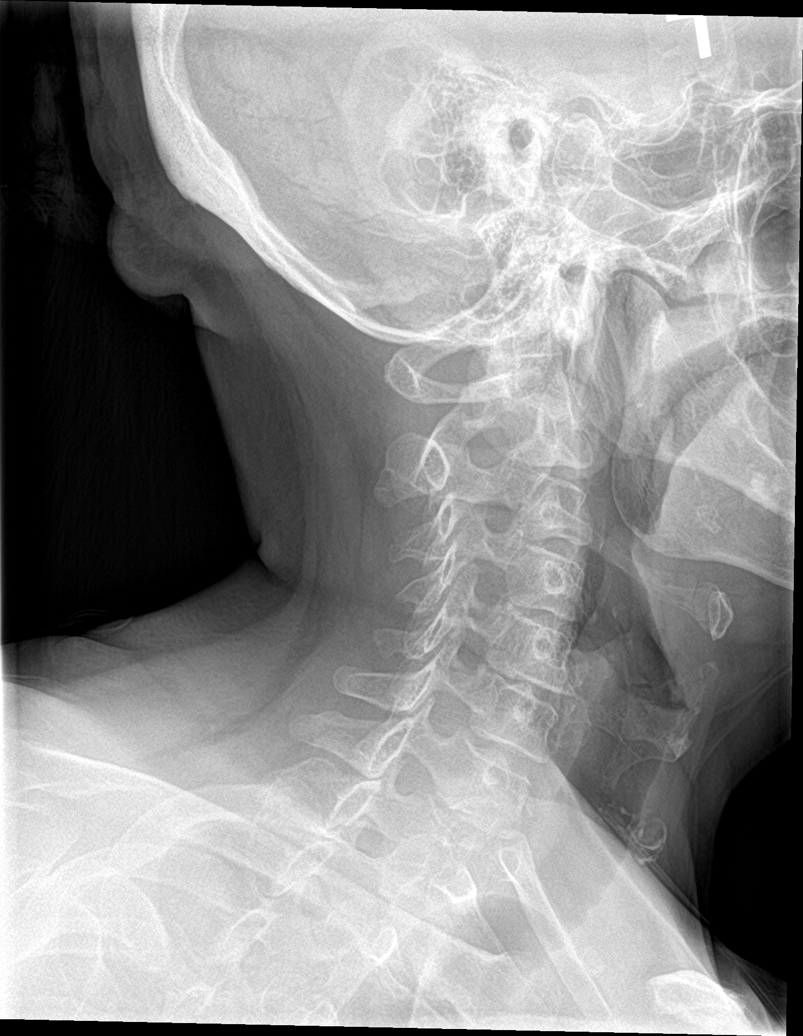

[c-spine obl (2 of 2)]
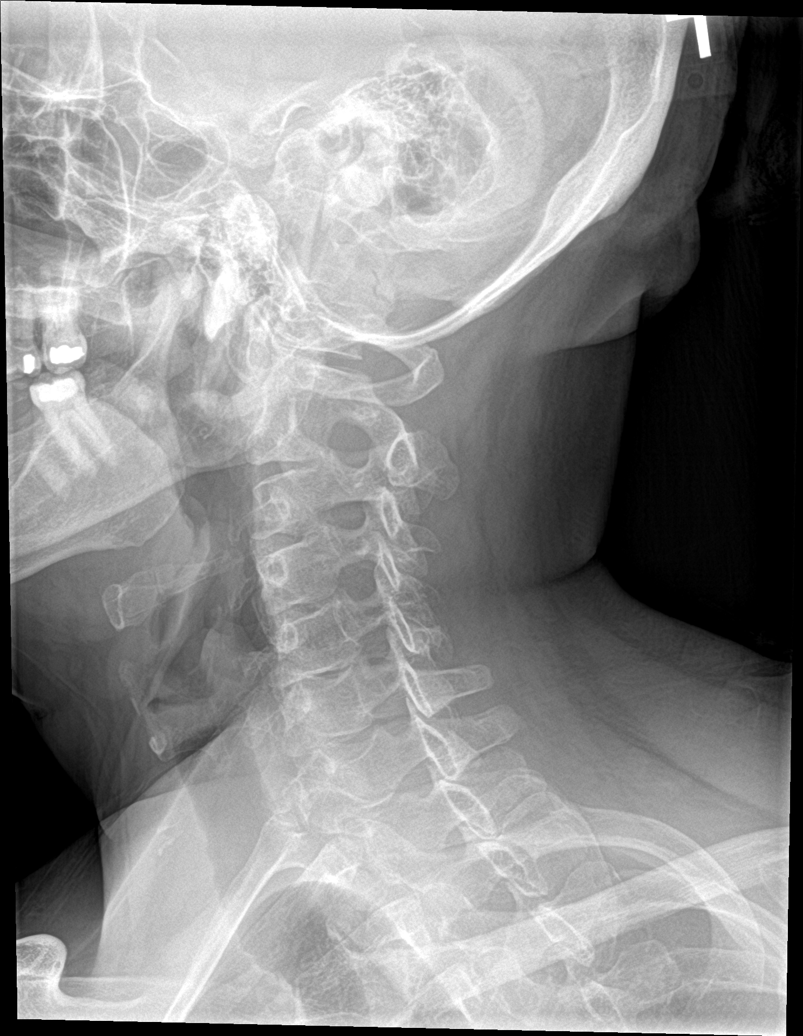

[c-spine ap (1 of 2)]
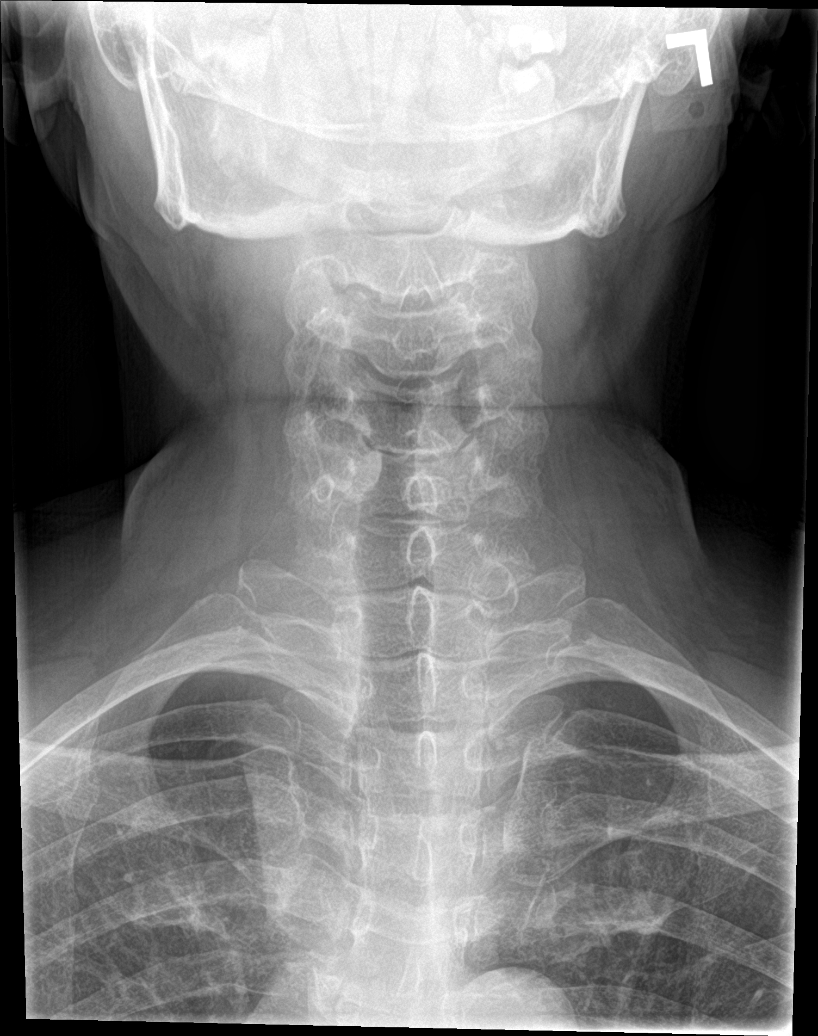

[c-spine open mouth]
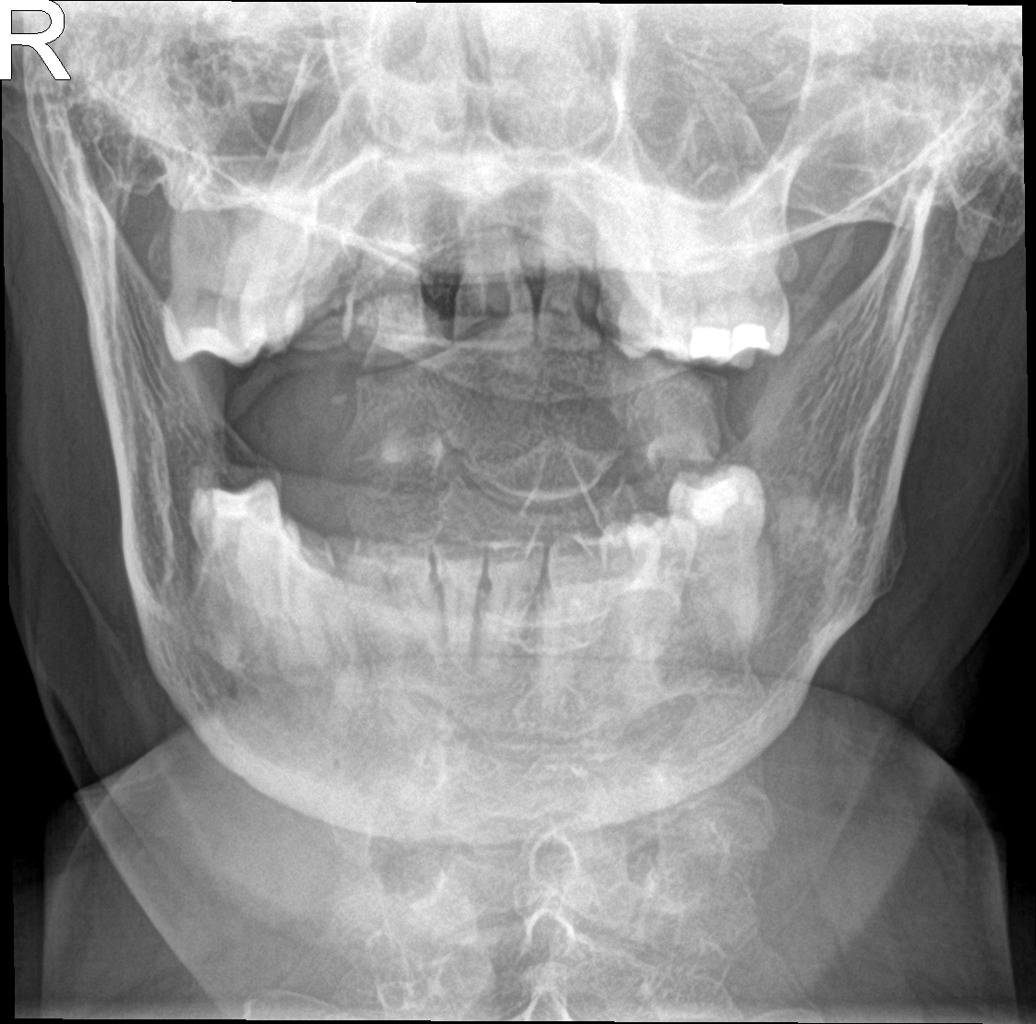

[c-spine ap (2 of 2)]
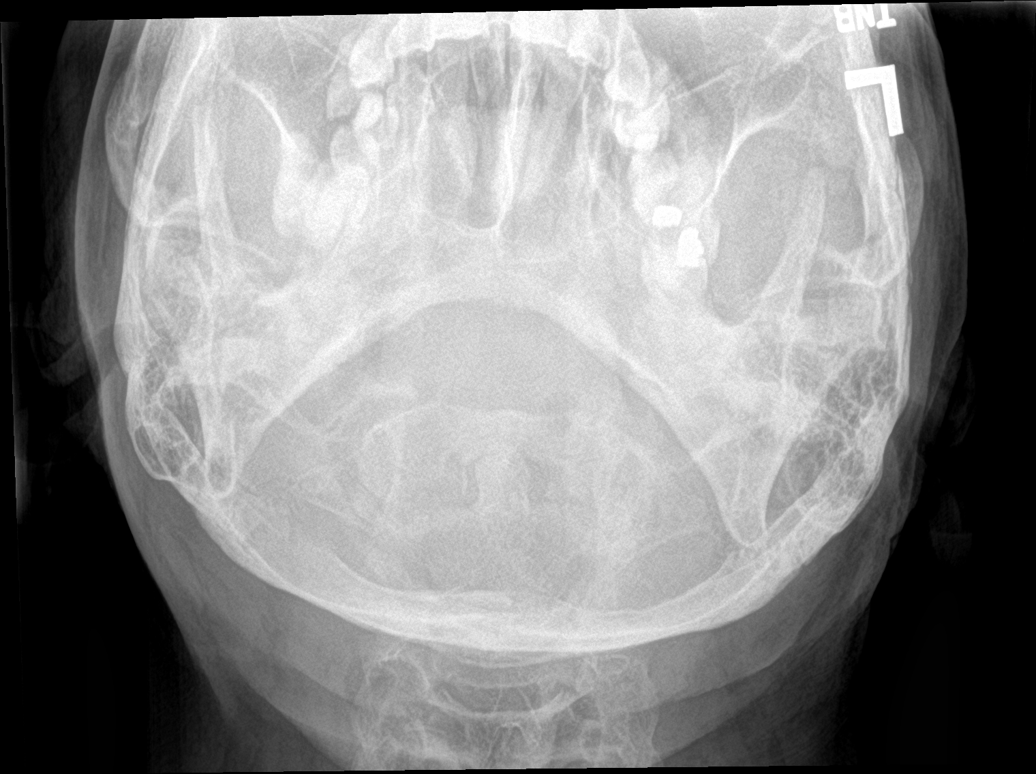

[6 of 6 positions shown; findings below may reference images not displayed]

FINDINGS: There is no evidence of cervical spine fracture or prevertebral soft
tissue swelling. Anterior osteophyte formation C4-5 level.
Preservation of the normal cervical lordosis. Posterior elements
remain normally aligned. No severe central canal a foraminal
stenosis is identified. No other significant bone abnormalities are
identified. Lung apices are clear. Airways patent
IMPRESSION: Negative cervical spine radiographs.

## 2020-11-07 ENCOUNTER — Other Ambulatory Visit: Payer: Self-pay

## 2020-11-07 ENCOUNTER — Telehealth (HOSPITAL_COMMUNITY): Payer: PRIVATE HEALTH INSURANCE | Admitting: Psychiatry

## 2020-11-20 ENCOUNTER — Encounter: Payer: Self-pay | Admitting: Nurse Practitioner

## 2020-11-20 ENCOUNTER — Telehealth (INDEPENDENT_AMBULATORY_CARE_PROVIDER_SITE_OTHER): Payer: PRIVATE HEALTH INSURANCE | Admitting: Nurse Practitioner

## 2020-11-20 ENCOUNTER — Other Ambulatory Visit: Payer: Self-pay

## 2020-11-20 DIAGNOSIS — J069 Acute upper respiratory infection, unspecified: Secondary | ICD-10-CM | POA: Diagnosis not present

## 2020-11-20 MED ORDER — PROMETHAZINE-DM 6.25-15 MG/5ML PO SYRP: 5.0000 mL | ORAL_SOLUTION | Freq: Four times a day (QID) | ORAL | 0 refills | Status: DC | PRN

## 2020-11-20 MED ORDER — NOREL AD 4-10-325 MG PO TABS: 1.0000 | ORAL_TABLET | ORAL | 1 refills | Status: DC | PRN

## 2020-11-20 NOTE — Assessment & Plan Note (Signed)
-  symptoms started 5 days ago -took Robitussin-DM, but that didn't help -Rx. protehazine-DM -Rx. norel -will conisder adding abx, like z-pack, in 2 days if symptoms persist -recommended home COVID test since pharmacies have been full and COVID clinic hours limited; if positive, notify the office

## 2020-11-20 NOTE — Progress Notes (Signed)
Acute Office Visit  Subjective:    Patient ID: Olivia Woodward, female    DOB: 03-Mar-1966, 55 y.o.   MRN: [id number]  Chief Complaint  Patient presents with  . Cough    X 5 days   . Nasal Congestion    X 5 days     HPI Patient is in today for non-productive cough and congestion x 5 days (started 11/16/20).  Denies fever. She has tried robitussin-DM, but that is not helping.  Past Medical History:  Diagnosis Date  . Anemia   . Generalized headaches   . Hypertension     Past Surgical History:  Procedure Laterality Date  . ABDOMINAL HYSTERECTOMY     fibroids, partial  . BREAST REDUCTION SURGERY  2005  . COLONOSCOPY WITH PROPOFOL N/A 05/10/2019   Procedure: COLONOSCOPY WITH PROPOFOL;  Surgeon: Daneil Dolin, MD;  [facility];  Service: Endoscopy;  Laterality: N/A;  2:45pm  . GASTRIC BYPASS  2007  . POLYPECTOMY  05/10/2019   Procedure: POLYPECTOMY;  Surgeon: Daneil Dolin, MD;  Location[facility];  Service: Endoscopy;;  colon    Family History  Problem Relation Age of Onset  . Hypertension Mother   . Diabetes Mother   . Drug abuse Mother   . Hypertension Father   . Hypertension Sister   . Hypertension Sister   . Colon cancer Neg Hx     Social History   Socioeconomic History  . Marital status: Single    Spouse name: Not on file  . Number of children: Not on file  . Years of education: Not on file  . Highest education level: Not on file  Occupational History  . Not on file  Tobacco Use  . Smoking status: Never Smoker  . Smokeless tobacco: Never Used  Substance and Sexual Activity  . Alcohol use: No  . Drug use: No  . Sexual activity: Yes    Birth control/protection: Condom  Other Topics Concern  . Not on file  Social History Narrative  . Not on file   Social Determinants of Health   Financial Resource Strain: Not on file  Food Insecurity: Not on file  Transportation Needs: Not on file  Physical Activity: Not on file  Stress:  Not on file  Social Connections: Not on file  Intimate Partner Violence: Not on file    Outpatient Medications Prior to Visit  Medication Sig Dispense Refill  . albuterol (VENTOLIN HFA) 108 (90 Base) MCG/ACT inhaler Inhale 2 puffs into the lungs every 6 (six) hours as needed for wheezing or shortness of breath. 18 g 0  . ALPRAZolam (XANAX) 0.25 MG tablet Take 1 tablet (0.25 mg total) by mouth 2 (two) times daily as needed for anxiety. 60 tablet 1  . ALPRAZolam (XANAX) 1 MG tablet Take 1 tablet (1 mg total) by mouth every evening. 30 tablet 1  . amLODipine (NORVASC) 10 MG tablet Take 1 tablet (10 mg total) by mouth daily. 90 tablet 3  . Cyanocobalamin (B-12) 2500 MCG TABS Take 1 tablet by mouth daily.    . ergocalciferol (VITAMIN D2) 1.25 MG (50000 UT) capsule Take 1 capsule (50,000 Units total) by mouth once a week. One capsule once weekly 12 capsule 3  . escitalopram (LEXAPRO) 20 MG tablet Take 1 tablet by mouth once daily 30 tablet 0  . meclizine (ANTIVERT) 25 MG tablet Take 1 tablet (25 mg total) by mouth 3 (three) times daily as needed for dizziness. [place]  tablet 0  . ondansetron (ZOFRAN) 4 MG tablet Take 1 tablet (4 mg total) by mouth every 8 (eight) hours as needed for nausea or vomiting. 20 tablet 1  . rizatriptan (MAXALT) 10 MG tablet Take 1 tablet (10 mg total) by mouth as needed for migraine. May repeat in 2 hours if needed 10 tablet 1  . topiramate (TOPAMAX) 50 MG tablet Take 1 tablet (50 mg total) by mouth 2 (two) times daily. 60 tablet 3   No facility-administered medications prior to visit.    Allergies  Allergen Reactions  . Ketorolac Tromethamine Hives    Lips swelled    Review of Systems  Constitutional: Negative for chills, fatigue and fever.  HENT: Positive for congestion, postnasal drip and rhinorrhea. Negative for sinus pressure, sinus pain and sore throat.   Respiratory: Positive for cough. Negative for shortness of breath and wheezing.   Gastrointestinal: Negative  for diarrhea, nausea and vomiting.       Objective:    Physical Exam  There were no vitals taken for this visit. Wt Readings from Last 3 Encounters:  10/26/20 250 lb 12.8 oz (113.8 kg)  10/04/20 250 lb (113.4 kg)  09/11/20 256 lb (116.1 kg)    Health Maintenance Due  Topic Date Due  . COVID-19 Vaccine (2 - Booster for [employer] series) 03/26/2020  . INFLUENZA VACCINE  06/18/2020  . TETANUS/TDAP  07/25/2020  . PAP SMEAR-Modifier  12/25/2020    There are no preventive care reminders to display for this patient.   Lab Results  Component Value Date   TSH 0.278 (L) 09/06/2020   TSH 0.279 (L) 09/06/2020   Lab Results  Component Value Date   WBC 6.5 10/04/2020   HGB 13.1 10/04/2020   HCT 41.8 10/04/2020   MCV 101.5 (H) 10/04/2020   PLT 374 10/04/2020   Lab Results  Component Value Date   NA 139 10/04/2020   K 3.3 (L) 10/04/2020   CO2 29 10/04/2020   GLUCOSE 91 10/04/2020   BUN 12 10/04/2020   CREATININE 0.79 10/04/2020   BILITOT 0.6 09/06/2020   ALKPHOS 52 09/06/2020   AST 14 09/06/2020   ALT 10 09/06/2020   PROT 6.4 09/06/2020   ALBUMIN 3.7 (L) 09/06/2020   CALCIUM 8.8 (L) 10/04/2020   ANIONGAP 7 10/04/2020   Lab Results  Component Value Date   CHOL 216 (H) 09/06/2020   Lab Results  Component Value Date   HDL 82 09/06/2020   Lab Results  Component Value Date   LDLCALC 124 (H) 09/06/2020   Lab Results  Component Value Date   TRIG 58 09/06/2020   Lab Results  Component Value Date   CHOLHDL 2.6 09/06/2020   Lab Results  Component Value Date   HGBA1C 5.3 07/26/2010       Assessment & Plan:   Problem List Items Addressed This Visit      Respiratory   URI (upper respiratory infection)    -symptoms started 5 days ago -took Robitussin-DM, but that didn't help -Rx. protehazine-DM -Rx. norel -will conisder adding abx, like z-pack, in 2 days if symptoms persist -recommended home COVID test since pharmacies have been full and [place] clinic hours  limited; if positive, notify the office          Meds ordered this encounter  Medications  . promethazine-dextromethorphan (PROMETHAZINE-DM) 6.25-15 MG/5ML syrup    Sig: Take 5 mLs by mouth 4 (four) times daily as needed for cough.    Dispense:  118 mL  Refill:  0  . Chlorphen-PE-Acetaminophen (NOREL AD) 4-10-325 MG TABS    Sig: Take 1 tablet by mouth every 4 (four) hours as needed (nasal congestion, cold symptoms).    Dispense:  20 tablet    Refill:  1   Date:  11/20/2020   Location of Patient: Home Location of Provider: Office Consent was obtain for visit to be over via telehealth. I verified that I am speaking with the correct person using two identifiers.  I connected with  Olivia Woodward on 11/20/20 via telephone and verified that I am speaking with the correct person using two identifiers.   I discussed the limitations of evaluation and management by telemedicine. The patient expressed understanding and agreed to proceed.  Time spent: 8 minutes   Heather Roberts, NP

## 2020-11-25 ENCOUNTER — Other Ambulatory Visit (HOSPITAL_COMMUNITY): Payer: Self-pay | Admitting: Psychiatry

## 2020-11-25 DIAGNOSIS — F3341 Major depressive disorder, recurrent, in partial remission: Secondary | ICD-10-CM

## 2020-11-25 DIAGNOSIS — F419 Anxiety disorder, unspecified: Secondary | ICD-10-CM

## 2020-12-20 ENCOUNTER — Telehealth (INDEPENDENT_AMBULATORY_CARE_PROVIDER_SITE_OTHER): Payer: PRIVATE HEALTH INSURANCE | Admitting: Psychiatry

## 2020-12-20 ENCOUNTER — Encounter (HOSPITAL_COMMUNITY): Payer: Self-pay | Admitting: Psychiatry

## 2020-12-20 ENCOUNTER — Other Ambulatory Visit: Payer: Self-pay

## 2020-12-20 DIAGNOSIS — F419 Anxiety disorder, unspecified: Secondary | ICD-10-CM

## 2020-12-20 DIAGNOSIS — F3341 Major depressive disorder, recurrent, in partial remission: Secondary | ICD-10-CM | POA: Diagnosis not present

## 2020-12-20 MED ORDER — ESCITALOPRAM OXALATE 20 MG PO TABS: 20.0000 mg | ORAL_TABLET | Freq: Every day | ORAL | 1 refills | Status: DC

## 2020-12-20 MED ORDER — ALPRAZOLAM 0.25 MG PO TABS: 0.2500 mg | ORAL_TABLET | Freq: Two times a day (BID) | ORAL | 1 refills | Status: DC | PRN

## 2020-12-20 MED ORDER — ALPRAZOLAM 1 MG PO TABS: 1.0000 mg | ORAL_TABLET | Freq: Every evening | ORAL | 1 refills | Status: DC

## 2020-12-20 NOTE — Progress Notes (Signed)
[facility] MD OP Progress Note   Virtual Visit via Video Note  I connected with Olivia Woodward on 12/20/20 at 10:20 AM EST by a video enabled telemedicine application and verified that I am speaking with the correct person using two identifiers.  Location: Patient: Home Provider: Clinic   I discussed the limitations of evaluation and management by telemedicine and the availability of in person appointments. The patient expressed understanding and agreed to proceed.  I provided 18 minutes of non-face-to-face time during this encounter.    12/20/2020 10:55 AM Olivia Woodward  MRN:  [id number]  Chief Complaint:  " I have been feeling anxious and depressed because of my kids."  HPI: Patient reported that she has been feeling quite anxious and depressed lately and her main stressor is her children.  She stated that she is still trying to stay busy with her job as she still working from home.  She stated that she has been feeling panicky but she is continuing to push through and just focus on 1 day at a time.  She described in great detail how her older child who is 4 years old has decided to quit from college because he wants to become a truck driver.  She stated that he basically surprised her with his choice of his when he had come home for the winter break at the end of his last semester.  She stated that he wanted her to say it is all right and they will do what he wanted to however when she said that this was not a good idea and that he should really focus on completing his college degree he got upset.  She stated that he started yelling at her and also blamed her for causing him to have a miserable life.  She stated that he accused her of always being focused on his grades and never letting him do what he wants to do. She stated that she was able to convince him to return back to college for the semester and agreed that he dropped down the number of classes from 5 classes to 2 classes.  However, he  continues to be rebelling against her decisions.  She stated that on 1 day during a phone conversation he called her a bitch and that was very hurtful to the patient.  She decided to drive all the way to his college to talk to him face-to-face and tell him that she wanted him to complete his education because it was for his own good. She stated that her daughter has been going to school and attending classes regularly however she remains isolative and does not talk to her much during the day when they are at home together.  She stated that she worries about her daughter and has asked her if she has any thoughts of hurting herself and her daughter has declined that so far.  Writer validated the patient's feelings and informed her that being a mother it is acceptable for her to give the best advice to her child and help them see was good for them.  Writer advised her that she should involve patient's dad or some other family member that can talk to her son and give them the best advice.  She stated that her son is refusing to talk to his father now however her sister is willing to talk to him. She stated that her son has been spending the whole day outside with his friends whenever he comes home  to visit on the weekends.  She stated that it seems like he is avoiding her as he does not want to face the reality.  Writer recommended that patient continue same regimen for now and not feel guilty about the decisions she has made for her children.    Visit Diagnosis:    ICD-10-CM   1. MDD (major depressive disorder), recurrent, in partial remission ([place])  F33.41   2. Anxiety  F41.9     Past Psychiatric History: Depression, anxiety  Past Medical History:  Past Medical History:  Diagnosis Date  . Anemia   . Generalized headaches   . Hypertension     Past Surgical History:  Procedure Laterality Date  . ABDOMINAL HYSTERECTOMY     fibroids, partial  . BREAST REDUCTION SURGERY  2005  . COLONOSCOPY  WITH PROPOFOL N/A 05/10/2019   Procedure: COLONOSCOPY WITH PROPOFOL;  Surgeon: Daneil Dolin, MD;  [facility];  Service: Endoscopy;  Laterality: N/A;  2:45pm  . GASTRIC BYPASS  2007  . POLYPECTOMY  05/10/2019   Procedure: POLYPECTOMY;  Surgeon: Daneil Dolin, MD;  [facility];  Service: Endoscopy;;  colon    Family Psychiatric History: See Below  Family History:  Family History  Problem Relation Age of Onset  . Hypertension Mother   . Diabetes Mother   . Drug abuse Mother   . Hypertension Father   . Hypertension Sister   . Hypertension Sister   . Colon cancer Neg Hx     Social History:  Social History   Socioeconomic History  . Marital status: Single    Spouse name: Not on file  . Number of children: Not on file  . Years of education: Not on file  . Highest education level: Not on file  Occupational History  . Not on file  Tobacco Use  . Smoking status: Never Smoker  . Smokeless tobacco: Never Used  Substance and Sexual Activity  . Alcohol use: No  . Drug use: No  . Sexual activity: Yes    Birth control/protection: Condom  Other Topics Concern  . Not on file  Social History Narrative  . Not on file   Social Determinants of Health   Financial Resource Strain: Not on file  Food Insecurity: Not on file  Transportation Needs: Not on file  Physical Activity: Not on file  Stress: Not on file  Social Connections: Not on file    Allergies:  Allergies  Allergen Reactions  . Ketorolac Tromethamine Hives    Lips swelled    Metabolic Disorder Labs: Lab Results  Component Value Date   HGBA1C 5.3 07/26/2010   No results found for: PROLACTIN Lab Results  Component Value Date   CHOL 216 (H) 09/06/2020   TRIG 58 09/06/2020   HDL 82 09/06/2020   CHOLHDL 2.6 09/06/2020   VLDL 10 01/02/2017   LDLCALC 124 (H) 09/06/2020   LDLCALC 71 12/27/2017   Lab Results  Component Value Date   TSH 0.278 (L) 09/06/2020   TSH 0.279 (L)  09/06/2020    Therapeutic Level Labs: No results found for: LITHIUM No results found for: VALPROATE No components found for:  CBMZ  Current Medications: Current Outpatient Medications  Medication Sig Dispense Refill  . albuterol (VENTOLIN HFA) 108 (90 Base) MCG/ACT inhaler Inhale 2 puffs into the lungs every 6 (six) hours as needed for wheezing or shortness of breath. 18 g 0  . ALPRAZolam (XANAX) 0.25 MG tablet Take 1 tablet (0.25  mg total) by mouth 2 (two) times daily as needed for anxiety. 60 tablet 1  . ALPRAZolam (XANAX) 1 MG tablet Take 1 tablet (1 mg total) by mouth every evening. 30 tablet 1  . amLODipine (NORVASC) 10 MG tablet Take 1 tablet (10 mg total) by mouth daily. 90 tablet 3  . Chlorphen-PE-Acetaminophen (NOREL AD) 4-10-325 MG TABS Take 1 tablet by mouth every 4 (four) hours as needed (nasal congestion, cold symptoms). 20 tablet 1  . Cyanocobalamin (B-12) 2500 MCG TABS Take 1 tablet by mouth daily.    . ergocalciferol (VITAMIN D2) 1.25 MG (50000 UT) capsule Take 1 capsule (50,000 Units total) by mouth once a week. One capsule once weekly 12 capsule 3  . escitalopram (LEXAPRO) 20 MG tablet Take 1 tablet by mouth once daily 30 tablet 0  . meclizine (ANTIVERT) 25 MG tablet Take 1 tablet (25 mg total) by mouth 3 (three) times daily as needed for dizziness. 20 tablet 0  . ondansetron (ZOFRAN) 4 MG tablet Take 1 tablet (4 mg total) by mouth every 8 (eight) hours as needed for nausea or vomiting. 20 tablet 1  . promethazine-dextromethorphan (PROMETHAZINE-DM) 6.25-15 MG/5ML syrup Take 5 mLs by mouth 4 (four) times daily as needed for cough. 118 mL 0  . rizatriptan (MAXALT) 10 MG tablet Take 1 tablet (10 mg total) by mouth as needed for migraine. May repeat in 2 hours if needed 10 tablet 1  . topiramate (TOPAMAX) 50 MG tablet Take 1 tablet (50 mg total) by mouth 2 (two) times daily. 60 tablet 3   No current facility-administered medications for this visit.     Psychiatric Specialty  Exam: Review of Systems  There were no vitals taken for this visit.There is no height or weight on file to calculate BMI.  General Appearance: Fairly groomed  Eye Contact:  Good  Speech:  Clear and Coherent and Normal Rate  Volume:  Normal  Mood: Anxious, depressed  Affect:  Congruent  Thought Process:  Goal Directed and Linear  Orientation:  Full (Time, Place, and Person)  Thought Content: Logical   Suicidal Thoughts:  No  Homicidal Thoughts:  No  Memory:  Recent;   Fair Remote;   Good  Judgement:  Fair  Insight:  Fair  Psychomotor Activity:  Normal  Concentration:  Concentration: Good and Attention Span: Fair  Recall:  Good  Fund of Knowledge: Good  Language: Good  Akathisia:  Unable to assess, virtual visit  Handed:  Right  AIMS (if indicated): not done  Assets:  Communication Skills Desire for Improvement Financial Resources/Insurance Housing Social Support  ADL's:  Intact  Cognition: WNL  Sleep:  Fair   Screenings: GAD-7   [place] Office Visit from 03/17/2019 in [facility] Counselor from 10/09/2018 in [facility] Phone Follow Up from 08/04/2018 in [facility] Phone Follow Up from 07/16/2018 in [facility] Office Visit from 07/09/2018 in [facility]  Total GAD-7 Score 5 19 12 12 15     PHQ2-9   Flowsheet Row Video Visit from 11/20/2020 in [facility] Office Visit from 09/11/2020 in [facility] Video Visit from 09/05/2020 in [facility] Video Visit from 08/21/2020 in [facility] Video Visit from 08/08/2020 in [facility]  PHQ-2 Total Score 0 1 3 3  0  PHQ-9 Total Score -- -- 12 9 0       Assessment and Plan: Patient is currently under stress  due to her son making the decision to drop out of college and when the patient did not agree with that her son has been blaming the patient.  She is also  worried about her daughter who has history of psychiatry issues and prior psychiatric hospitalization.  Her daughter is lately isolated and keeping to herself.  Patient is trying her best to deal with her stressors and does not want to change any medication doses at this point.  1. MDD (major depressive disorder), recurrent, in partial remission ([facility])  - escitalopram (LEXAPRO) 20 MG tablet; Take 1 tablet (20 mg total) by mouth daily.  Dispense: 30 tablet; Refill: 1  2. Anxiety  - ALPRAZolam (XANAX) 0.25 MG tablet; Take 1 tablet (0.25 mg total) by mouth 2 (two) times daily as needed for anxiety.  Dispense: 60 tablet; Refill: 1 - ALPRAZolam (XANAX) 1 MG tablet; Take 1 tablet (1 mg total) by mouth every evening.  Dispense: 30 tablet; Refill: 1 - escitalopram (LEXAPRO) 20 MG tablet; Take 1 tablet (20 mg total) by mouth daily.  Dispense: 30 tablet; Refill: 1  Supportive psychotherapy was provided during the session. Continue same regimen. F/up in 2 months.  [place] Crane, MD 12/20/2020, 10:55 AM

## 2020-12-25 ENCOUNTER — Other Ambulatory Visit (HOSPITAL_COMMUNITY): Payer: Self-pay | Admitting: Psychiatry

## 2020-12-25 DIAGNOSIS — F419 Anxiety disorder, unspecified: Secondary | ICD-10-CM

## 2020-12-26 ENCOUNTER — Encounter: Payer: Self-pay | Admitting: Family Medicine

## 2020-12-27 ENCOUNTER — Other Ambulatory Visit (HOSPITAL_COMMUNITY): Payer: Self-pay | Admitting: Psychiatry

## 2020-12-27 DIAGNOSIS — F419 Anxiety disorder, unspecified: Secondary | ICD-10-CM

## 2021-01-16 ENCOUNTER — Telehealth: Payer: Self-pay

## 2021-01-16 NOTE — Telephone Encounter (Signed)
Pt called wanting to know if she could get a refill on her Mupirocin 2% cream. I do not see it in her med list she says she thinks it must have been taken out but she still uses this. Please advise.

## 2021-01-17 ENCOUNTER — Other Ambulatory Visit: Payer: Self-pay | Admitting: Family Medicine

## 2021-01-17 MED ORDER — MUPIROCIN 2 % EX OINT: TOPICAL_OINTMENT | CUTANEOUS | 0 refills | Status: DC

## 2021-01-17 NOTE — Telephone Encounter (Signed)
Med sent please let her know

## 2021-01-17 NOTE — Telephone Encounter (Signed)
Voicemail box full

## 2021-01-19 NOTE — Telephone Encounter (Signed)
Pt informed

## 2021-01-24 ENCOUNTER — Other Ambulatory Visit (HOSPITAL_COMMUNITY): Payer: Self-pay | Admitting: Psychiatry

## 2021-01-24 DIAGNOSIS — F419 Anxiety disorder, unspecified: Secondary | ICD-10-CM

## 2021-02-16 ENCOUNTER — Encounter (HOSPITAL_COMMUNITY): Payer: Self-pay | Admitting: Psychiatry

## 2021-02-16 ENCOUNTER — Telehealth (INDEPENDENT_AMBULATORY_CARE_PROVIDER_SITE_OTHER): Payer: PRIVATE HEALTH INSURANCE | Admitting: Psychiatry

## 2021-02-16 ENCOUNTER — Other Ambulatory Visit: Payer: Self-pay

## 2021-02-16 DIAGNOSIS — F3341 Major depressive disorder, recurrent, in partial remission: Secondary | ICD-10-CM

## 2021-02-16 DIAGNOSIS — F419 Anxiety disorder, unspecified: Secondary | ICD-10-CM | POA: Diagnosis not present

## 2021-02-16 MED ORDER — ESCITALOPRAM OXALATE 20 MG PO TABS: 20.0000 mg | ORAL_TABLET | Freq: Every day | ORAL | 2 refills | Status: DC

## 2021-02-16 MED ORDER — ALPRAZOLAM 0.25 MG PO TABS: 0.2500 mg | ORAL_TABLET | Freq: Two times a day (BID) | ORAL | 2 refills | Status: DC | PRN

## 2021-02-16 MED ORDER — ALPRAZOLAM 1 MG PO TABS: 1.0000 mg | ORAL_TABLET | Freq: Every evening | ORAL | 2 refills | Status: DC

## 2021-02-16 NOTE — Progress Notes (Signed)
[facility] MD OP Progress Note   Virtual Visit via Telephone Note  I connected with Olivia Woodward on 02/16/21 at 10:00 AM EDT by telephone and verified that I am speaking with the correct person using two identifiers.  Location: Patient: home Provider: Clinic   I discussed the limitations, risks, security and privacy concerns of performing an evaluation and management service by telephone and the availability of in person appointments. I also discussed with the patient that there may be a patient responsible charge related to this service. The patient expressed understanding and agreed to proceed.   I provided 17 minutes of non-face-to-face time during this encounter.      02/16/2021 9:38 AM Olivia Woodward  MRN:  [id number]  Chief Complaint:  " Things have been stressful."  HPI:  Son is now taking 2 classes online. He still lives in the rented apartment near college campus. He is not communicating much with her so she is not sure what he is doing with his studies. Daughter is doing better but refuses to do chores around the house. They get in to altercations at time. She stated that her children will be fine as all she is doing whatever they want her to do for them.  However whenever she tells them to basic chores around the house they give her a big attitude. She expressed frustration in dealing with her children's behaviors towards her. She stated that she feels very guilty when her children say things like she is hurting their feelings etc. She stated that her daughter is about her 54 and she was planning to book a limousine at her friend's school for right however due to her behavioral problems she decided not to do that.  She stated that now her daughter is very unhappy about that but patient does not want to change her decision. Writer acknowledged her feelings and advised her to keep doing the right thing is the mother and not to feel guilty excessively about the reaction she is getting  from them along the way. Patient was appreciative of the advice.    Visit Diagnosis:    ICD-10-CM   1. MDD (major depressive disorder), recurrent, in partial remission ([place])  F33.41   2. Anxiety  F41.9     Past Psychiatric History: Depression, anxiety  Past Medical History:  Past Medical History:  Diagnosis Date  . Anemia   . Generalized headaches   . Hypertension     Past Surgical History:  Procedure Laterality Date  . ABDOMINAL HYSTERECTOMY     fibroids, partial  . BREAST REDUCTION SURGERY  2005  . COLONOSCOPY WITH PROPOFOL N/A 05/10/2019   Procedure: COLONOSCOPY WITH PROPOFOL;  Surgeon: Daneil Dolin, MD;  Location: [facility];  Service: Endoscopy;  Laterality: N/A;  2:45pm  . GASTRIC BYPASS  2007  . POLYPECTOMY  05/10/2019   Procedure: POLYPECTOMY;  Surgeon: Daneil Dolin, MD;  Location: [facility];  Service: Endoscopy;;  colon    Family Psychiatric History: See Below  Family History:  Family History  Problem Relation Age of Onset  . Hypertension Mother   . Diabetes Mother   . Drug abuse Mother   . Hypertension Father   . Hypertension Sister   . Hypertension Sister   . Colon cancer Neg Hx     Social History:  Social History   Socioeconomic History  . Marital status: Single    Spouse name: Not on file  . Number of children: Not on  file  . Years of education: Not on file  . Highest education level: Not on file  Occupational History  . Not on file  Tobacco Use  . Smoking status: Never Smoker  . Smokeless tobacco: Never Used  Substance and Sexual Activity  . Alcohol use: No  . Drug use: No  . Sexual activity: Yes    Birth control/protection: Condom  Other Topics Concern  . Not on file  Social History Narrative  . Not on file   Social Determinants of Health   Financial Resource Strain: Not on file  Food Insecurity: Not on file  Transportation Needs: Not on file  Physical Activity: Not on file  Stress: Not on file  Social Connections:  Not on file    Allergies:  Allergies  Allergen Reactions  . Ketorolac Tromethamine Hives    Lips swelled    Metabolic Disorder Labs: Lab Results  Component Value Date   HGBA1C 5.3 07/26/2010   No results found for: PROLACTIN Lab Results  Component Value Date   CHOL 216 (H) 09/06/2020   TRIG 58 09/06/2020   HDL 82 09/06/2020   CHOLHDL 2.6 09/06/2020   VLDL 10 01/02/2017   LDLCALC 124 (H) 09/06/2020   LDLCALC 71 12/27/2017   Lab Results  Component Value Date   TSH 0.278 (L) 09/06/2020   TSH 0.279 (L) 09/06/2020    Therapeutic Level Labs: No results found for: LITHIUM No results found for: VALPROATE No components found for:  CBMZ  Current Medications: Current Outpatient Medications  Medication Sig Dispense Refill  . albuterol (VENTOLIN HFA) 108 (90 Base) MCG/ACT inhaler Inhale 2 puffs into the lungs every 6 (six) hours as needed for wheezing or shortness of breath. 18 g 0  . ALPRAZolam (XANAX) 0.25 MG tablet Take 1 tablet (0.25 mg total) by mouth 2 (two) times daily as needed for anxiety. 60 tablet 1  . ALPRAZolam (XANAX) 1 MG tablet TAKE 1 TABLET BY MOUTH ONCE DAILY IN THE EVENING 30 tablet 0  . amLODipine (NORVASC) 10 MG tablet Take 1 tablet (10 mg total) by mouth daily. 90 tablet 3  . Chlorphen-PE-Acetaminophen (NOREL AD) 4-10-325 MG TABS Take 1 tablet by mouth every 4 (four) hours as needed (nasal congestion, cold symptoms). 20 tablet 1  . Cyanocobalamin (B-12) 2500 MCG TABS Take 1 tablet by mouth daily.    . ergocalciferol (VITAMIN D2) 1.25 MG (50000 UT) capsule Take 1 capsule (50,000 Units total) by mouth once a week. One capsule once weekly 12 capsule 3  . escitalopram (LEXAPRO) 20 MG tablet Take 1 tablet (20 mg total) by mouth daily. 30 tablet 1  . meclizine (ANTIVERT) 25 MG tablet Take 1 tablet (25 mg total) by mouth 3 (three) times daily as needed for dizziness. 20 tablet 0  . mupirocin ointment (BACTROBAN) 2 % Apply twice daily to affected area for 5 days,  then as needed 22 g 0  . ondansetron (ZOFRAN) 4 MG tablet Take 1 tablet (4 mg total) by mouth every 8 (eight) hours as needed for nausea or vomiting. 20 tablet 1  . promethazine-dextromethorphan (PROMETHAZINE-DM) 6.25-15 MG/5ML syrup Take 5 mLs by mouth 4 (four) times daily as needed for cough. 118 mL 0  . rizatriptan (MAXALT) 10 MG tablet Take 1 tablet (10 mg total) by mouth as needed for migraine. May repeat in 2 hours if needed 10 tablet 1  . topiramate (TOPAMAX) 50 MG tablet Take 1 tablet (50 mg total) by mouth 2 (two) times daily. [place]  tablet 3   No current facility-administered medications for this visit.     Psychiatric Specialty Exam: Review of Systems  There were no vitals taken for this visit.There is no height or weight on file to calculate BMI.  General Appearance: unable to assess due to phone visit  Eye Contact:  unable to assess due to phone visit  Speech:  Clear and Coherent and Normal Rate  Volume:  Normal  Mood: Anxious  Affect:  Congruent  Thought Process:  Goal Directed and Linear  Orientation:  Full (Time, Place, and Person)  Thought Content: Logical   Suicidal Thoughts:  No  Homicidal Thoughts:  No  Memory:  Recent;   Fair Remote;   Good  Judgement:  Fair  Insight:  Fair  Psychomotor Activity:  Normal  Concentration:  Concentration: Good and Attention Span: Fair  Recall:  Good  Fund of Knowledge: Good  Language: Good  Akathisia:  Unable to assess, virtual visit  Handed:  Right  AIMS (if indicated): not done  Assets:  Communication Skills Desire for Improvement Financial Resources/Insurance Housing Social Support  ADL's:  Intact  Cognition: WNL  Sleep:  Fair   Screenings: GAD-7   [place] Office Visit from 03/17/2019 in [facility] Counselor from 10/09/2018 in [facility] Phone Follow Up from 08/04/2018 in [place] [facility] Phone Follow Up from 07/16/2018 in  [facility] Office Visit from 07/09/2018 in [facility]  Total GAD-7 Score 5 19 12 12 15     PHQ2-9   Flowsheet Row Video Visit from 11/20/2020 in [facility] Office Visit from 09/11/2020 in [facility] Video Visit from 09/05/2020 in [facility] Video Visit from 08/21/2020 in [facility] Video Visit from 08/08/2020 in [facility]  PHQ-2 Total Score 0 1 3 3  0  PHQ-9 Total Score -- -- 12 9 0    [place] Phone Follow Up from 07/16/2018 in [facility] No Risk       Assessment and Plan: Patient is going to deal with conflicts with her children.  Was provided with supportive psychotherapy.  1. MDD (major depressive disorder), recurrent, in partial remission ([facility])  - escitalopram (LEXAPRO) 20 MG tablet; Take 1 tablet (20 mg total) by mouth daily.  Dispense: 30 tablet; Refill: 1  2. Anxiety  - ALPRAZolam (XANAX) 0.25 MG tablet; Take 1 tablet (0.25 mg total) by mouth 2 (two) times daily as needed for anxiety.  Dispense: 60 tablet; Refill: 1 - ALPRAZolam (XANAX) 1 MG tablet; Take 1 tablet (1 mg total) by mouth every evening.  Dispense: 30 tablet; Refill: 1 - escitalopram (LEXAPRO) 20 MG tablet; Take 1 tablet (20 mg total) by mouth daily.  Dispense: 30 tablet; Refill: 1  Supportive psychotherapy was provided during the session. Continue same regimen. F/up in 10 weeks.  [place] Crane, MD 02/16/2021, 9:38 AM

## 2021-02-20 ENCOUNTER — Encounter: Payer: Self-pay | Admitting: Family Medicine

## 2021-02-24 ENCOUNTER — Other Ambulatory Visit (HOSPITAL_COMMUNITY): Payer: Self-pay | Admitting: Psychiatry

## 2021-02-24 DIAGNOSIS — F419 Anxiety disorder, unspecified: Secondary | ICD-10-CM

## 2021-02-26 NOTE — Telephone Encounter (Signed)
Rx has already been called to her pharmacy

## 2021-02-27 ENCOUNTER — Other Ambulatory Visit (HOSPITAL_COMMUNITY): Payer: Self-pay | Admitting: Psychiatry

## 2021-02-27 DIAGNOSIS — F419 Anxiety disorder, unspecified: Secondary | ICD-10-CM

## 2021-02-27 NOTE — Telephone Encounter (Signed)
Attempted to clear this request out of your mailbox and per her pharmacy I reordered it, or tried to. I called the [facility] in [place] and the rx I inadvertantly called in today has been cancelled. She picked up the original rx on 02/23/21.

## 2021-03-16 ENCOUNTER — Other Ambulatory Visit: Payer: Self-pay | Admitting: Family Medicine

## 2021-03-16 ENCOUNTER — Encounter: Payer: Self-pay | Admitting: Family Medicine

## 2021-03-16 NOTE — Telephone Encounter (Signed)
Pt made virtual visit with dr Moshe Cipro 03-19-21

## 2021-03-19 ENCOUNTER — Other Ambulatory Visit: Payer: Self-pay

## 2021-03-19 ENCOUNTER — Telehealth (INDEPENDENT_AMBULATORY_CARE_PROVIDER_SITE_OTHER): Payer: PRIVATE HEALTH INSURANCE | Admitting: Family Medicine

## 2021-03-19 ENCOUNTER — Encounter: Payer: Self-pay | Admitting: Family Medicine

## 2021-03-19 VITALS — BP 170/80 | Ht 67.0 in | Wt 235.0 lb

## 2021-03-19 DIAGNOSIS — I10 Essential (primary) hypertension: Secondary | ICD-10-CM

## 2021-03-19 DIAGNOSIS — R600 Localized edema: Secondary | ICD-10-CM

## 2021-03-19 DIAGNOSIS — E559 Vitamin D deficiency, unspecified: Secondary | ICD-10-CM

## 2021-03-19 DIAGNOSIS — G43009 Migraine without aura, not intractable, without status migrainosus: Secondary | ICD-10-CM | POA: Diagnosis not present

## 2021-03-19 DIAGNOSIS — Z13220 Encounter for screening for lipoid disorders: Secondary | ICD-10-CM | POA: Diagnosis not present

## 2021-03-19 DIAGNOSIS — R7989 Other specified abnormal findings of blood chemistry: Secondary | ICD-10-CM | POA: Diagnosis not present

## 2021-03-19 DIAGNOSIS — M7989 Other specified soft tissue disorders: Secondary | ICD-10-CM

## 2021-03-19 MED ORDER — METHYLPREDNISOLONE ACETATE 80 MG/ML IJ SUSP: 80.0000 mg | Freq: Once | INTRAMUSCULAR | Status: DC

## 2021-03-19 MED ORDER — RIZATRIPTAN BENZOATE 10 MG PO TABS: 10.0000 mg | ORAL_TABLET | ORAL | 0 refills | Status: DC | PRN

## 2021-03-19 MED ORDER — PREDNISONE 10 MG PO TABS: 10.0000 mg | ORAL_TABLET | Freq: Two times a day (BID) | ORAL | 0 refills | Status: DC

## 2021-03-19 NOTE — Assessment & Plan Note (Addendum)
5 day h/o disabling headache with nausea, prednisone and zofran prescribed, work [employer] to return in am  Offered clinic visit for injections, later in the day called to say feeling too sick to come in Work excuse to return tomorrow

## 2021-03-19 NOTE — Patient Instructions (Addendum)
F/u as before , call if you need me before  Come in this morning for depo medrol 80 mg IM and also for blood pressure to be checked Work excuse for 04/28 , 4/29 and 5/2  Prednisone and maxalt are prescribed for your headache, use zofran for nausea  Fasting labs will be done this week Thursday when you come in, lipid, cmp and EGFR, tSH,Vit D  It is important that you exercise regularly at least 30 minutes 5 times a week. If you develop chest pain, have severe difficulty breathing, or feel very tired, stop exercising immediately and seek medical attention   Think about what you will eat, plan ahead. Choose " clean, green, fresh or frozen" over canned, processed or packaged foods which are more sugary, salty and fatty. 70 to 75% of food eaten should be vegetables and fruit. Three meals at set times with snacks allowed between meals, but they must be fruit or vegetables. Aim to eat over a 12 hour period , example 7 am to 7 pm, and STOP after  your last meal of the day. Drink water,generally about 64 ounces per day, no other drink is as healthy. Fruit juice is best enjoyed in a healthy way, by EATING the fruit. Thanks for choosing [facility], we consider it a privelige to serve you.

## 2021-03-19 NOTE — Progress Notes (Signed)
Virtual Visit via Telephone Note  I connected with Olivia Woodward on 03/19/21 at  9:40 AM EDT by telephone and verified that I am speaking with the correct person using two identifiers.  Changed to in office eval  Location: Patient: home Provider: office   I discussed the limitations, risks, security and privacy concerns of performing an evaluation and management service by telephone and the availability of in person appointments. I also discussed with the patient that there may be a patient responsible charge related to this service. The patient expressed understanding and agreed to proceed.   History of Present Illness: 5 day h/o headache using advil  Migraine and tylenol and will ease for 1 hr but pain never goes away, has nausea, headache is aond her eyes and at back of neck, pounding, followed by sharp pain. Last worked on Wednesday No fever, chills, nasal congestion, blurry vision with headaches no new weakness or numbness   Observations/Objective: BP (!) 170/80   Ht 5\' 7"  (1.702 m)   Wt 235 lb (106.6 kg)   BMI 36.81 kg/m  Good communication with no confusion and intact memory. Alert and oriented x 3 No signs of respiratory distress during speech    Assessment and Plan: Migraine headache 5 day h/o disabling headache with nausea, prednisone and zofran prescribed, work [employer] to return in am  Offered clinic visit for injections, later in the day called to say feeling too sick to come in Work excuse to return tomorrow  Essential hypertension Reports elevated and uncontrolled BP , needs in office assessment DASH diet and commitment to daily physical activity for a minimum of 30 minutes discussed and encouraged, as a part of hypertension management. The importance of attaining a healthy weight is also discussed.  BP/Weight 03/19/2021 10/26/2020 10/04/2020 09/11/2020 09/05/2020 08/21/2020 06/28/5725  Systolic BP 203 - 559 741 - 638 453  Diastolic BP 80 - 88 646 - 98 95  Wt.  (Lbs) 235 250.8 250 256 233 262 262  BMI 36.81 39.28 39.16 42.6 37.61 42.29 42.29  Some encounter information is confidential and restricted. Go to Review Flowsheets activity to see all data.         Follow Up Instructions:    I discussed the assessment and treatment plan with the patient. The patient was provided an opportunity to ask questions and all were answered. The patient agreed with the plan and demonstrated an understanding of the instructions.   The patient was advised to call back or seek an in-person evaluation if the symptoms worsen or if the condition fails to improve as anticipated.  I provided 12 minutes of non-face-to-face time during this encounter.   Tula Nakayama, MD

## 2021-03-19 NOTE — Assessment & Plan Note (Signed)
Reports elevated and uncontrolled BP , needs in office assessment DASH diet and commitment to daily physical activity for a minimum of 30 minutes discussed and encouraged, as a part of hypertension management. The importance of attaining a healthy weight is also discussed.  BP/Weight 03/19/2021 10/26/2020 10/04/2020 09/11/2020 09/05/2020 08/21/2020 3/50/0938  Systolic BP 182 - 993 716 - 967 893  Diastolic BP 80 - 88 810 - 98 95  Wt. (Lbs) 235 250.8 250 256 233 262 262  BMI 36.81 39.28 39.16 42.6 37.61 42.29 42.29  Some encounter information is confidential and restricted. Go to Review Flowsheets activity to see all data.

## 2021-03-20 ENCOUNTER — Other Ambulatory Visit: Payer: Self-pay

## 2021-03-20 ENCOUNTER — Ambulatory Visit (INDEPENDENT_AMBULATORY_CARE_PROVIDER_SITE_OTHER): Payer: PRIVATE HEALTH INSURANCE

## 2021-03-20 VITALS — BP 140/84 | HR 80

## 2021-03-20 DIAGNOSIS — G43009 Migraine without aura, not intractable, without status migrainosus: Secondary | ICD-10-CM | POA: Diagnosis not present

## 2021-03-20 MED ORDER — METHYLPREDNISOLONE ACETATE 80 MG/ML IJ SUSP
80.0000 mg | Freq: Once | INTRAMUSCULAR | Status: AC
  Administered 2021-03-20: 80 mg via INTRAMUSCULAR

## 2021-03-22 ENCOUNTER — Other Ambulatory Visit: Payer: Self-pay

## 2021-03-22 ENCOUNTER — Ambulatory Visit (INDEPENDENT_AMBULATORY_CARE_PROVIDER_SITE_OTHER): Payer: PRIVATE HEALTH INSURANCE | Admitting: Family Medicine

## 2021-03-22 ENCOUNTER — Other Ambulatory Visit (HOSPITAL_COMMUNITY)
Admission: RE | Admit: 2021-03-22 | Discharge: 2021-03-22 | Disposition: A | Discharge status: Home / Self Care | Payer: PRIVATE HEALTH INSURANCE | Source: Ambulatory Visit | Attending: Family Medicine | Admitting: Family Medicine

## 2021-03-22 ENCOUNTER — Encounter: Payer: Self-pay | Admitting: Family Medicine

## 2021-03-22 VITALS — BP 146/98 | HR 91 | Resp 16 | Ht 67.0 in | Wt 246.0 lb

## 2021-03-22 DIAGNOSIS — F322 Major depressive disorder, single episode, severe without psychotic features: Secondary | ICD-10-CM

## 2021-03-22 DIAGNOSIS — Z0000 Encounter for general adult medical examination without abnormal findings: Secondary | ICD-10-CM | POA: Diagnosis not present

## 2021-03-22 DIAGNOSIS — D508 Other iron deficiency anemias: Secondary | ICD-10-CM

## 2021-03-22 DIAGNOSIS — N750 Cyst of Bartholin's gland: Secondary | ICD-10-CM

## 2021-03-22 DIAGNOSIS — I10 Essential (primary) hypertension: Secondary | ICD-10-CM

## 2021-03-22 DIAGNOSIS — R7301 Impaired fasting glucose: Secondary | ICD-10-CM

## 2021-03-22 DIAGNOSIS — Z124 Encounter for screening for malignant neoplasm of cervix: Secondary | ICD-10-CM | POA: Diagnosis present

## 2021-03-22 DIAGNOSIS — N760 Acute vaginitis: Secondary | ICD-10-CM | POA: Diagnosis not present

## 2021-03-22 DIAGNOSIS — G43009 Migraine without aura, not intractable, without status migrainosus: Secondary | ICD-10-CM

## 2021-03-22 DIAGNOSIS — F3341 Major depressive disorder, recurrent, in partial remission: Secondary | ICD-10-CM | POA: Diagnosis not present

## 2021-03-22 DIAGNOSIS — D649 Anemia, unspecified: Secondary | ICD-10-CM

## 2021-03-22 DIAGNOSIS — R7989 Other specified abnormal findings of blood chemistry: Secondary | ICD-10-CM

## 2021-03-22 DIAGNOSIS — E042 Nontoxic multinodular goiter: Secondary | ICD-10-CM

## 2021-03-22 DIAGNOSIS — E559 Vitamin D deficiency, unspecified: Secondary | ICD-10-CM

## 2021-03-22 MED ORDER — FLUCONAZOLE 100 MG PO TABS: 100.0000 mg | ORAL_TABLET | Freq: Every day | ORAL | 0 refills | Status: DC

## 2021-03-22 MED ORDER — SPIRONOLACTONE 50 MG PO TABS: 50.0000 mg | ORAL_TABLET | Freq: Every day | ORAL | 3 refills | Status: DC

## 2021-03-22 NOTE — Assessment & Plan Note (Signed)
Refer to therapy being treated by psych

## 2021-03-22 NOTE — Patient Instructions (Addendum)
F/U in 6 weeks re evaluate blood pressure   New is spironolactone 50 mg daily  Fluconazole prescribed for itch  You are referred to therapist  You will be referred to Dr Garwin Brothers  Labs today, cBC, lipid, cmp and eGFr, hBA1c, TSH, Vit D, HSV 2  Please schedule mammogram at checkout, morning appt preferred    Specimen sent for pap , wet prep and culture  It is important that you exercise regularly at least 30 minutes 5 times a week. If you develop chest pain, have severe difficulty breathing, or feel very tired, stop exercising immediately and seek medical attention    1200 and 1500 cal diet sheet provided  Thanks for choosing [facility], we consider it a privelige to serve you.

## 2021-03-22 NOTE — Assessment & Plan Note (Signed)
Uncontrolled add spironolactone DASH diet and commitment to daily physical activity for a minimum of 30 minutes discussed and encouraged, as a part of hypertension management. The importance of attaining a healthy weight is also discussed.  BP/Weight 03/22/2021 03/20/2021 03/19/2021 10/26/2020 10/04/2020 09/11/2020 70/96/2836  Systolic BP 629 476 546 - 503 546 -  Diastolic BP 98 84 80 - 88 100 -  Wt. (Lbs) 246 - 235 250.8 250 256 233  BMI 38.53 - 36.81 39.28 39.16 42.6 37.61  Some encounter information is confidential and restricted. Go to Review Flowsheets activity to see all data.

## 2021-03-23 ENCOUNTER — Encounter: Payer: Self-pay | Admitting: Family Medicine

## 2021-03-23 DIAGNOSIS — N750 Cyst of Bartholin's gland: Secondary | ICD-10-CM | POA: Insufficient documentation

## 2021-03-23 DIAGNOSIS — E042 Nontoxic multinodular goiter: Secondary | ICD-10-CM | POA: Insufficient documentation

## 2021-03-23 DIAGNOSIS — R7989 Other specified abnormal findings of blood chemistry: Secondary | ICD-10-CM | POA: Insufficient documentation

## 2021-03-23 DIAGNOSIS — E559 Vitamin D deficiency, unspecified: Secondary | ICD-10-CM | POA: Insufficient documentation

## 2021-03-23 DIAGNOSIS — E0520 Thyrotoxicosis with toxic multinodular goiter without thyrotoxic crisis or storm: Secondary | ICD-10-CM | POA: Insufficient documentation

## 2021-03-23 DIAGNOSIS — N760 Acute vaginitis: Secondary | ICD-10-CM | POA: Insufficient documentation

## 2021-03-23 LAB — CERVICOVAGINAL ANCILLARY ONLY
Bacterial Vaginitis (gardnerella): NEGATIVE
Candida Glabrata: NEGATIVE
Candida Vaginitis: POSITIVE — AB
Chlamydia: NEGATIVE
Comment: NEGATIVE
Comment: NEGATIVE
Comment: NEGATIVE
Comment: NEGATIVE
Comment: NEGATIVE
Comment: NORMAL
Neisseria Gonorrhea: NEGATIVE
Trichomonas: NEGATIVE

## 2021-03-23 MED ORDER — ERGOCALCIFEROL 1.25 MG (50000 UT) PO CAPS: 50000.0000 [IU] | ORAL_CAPSULE | ORAL | 2 refills | Status: DC

## 2021-03-23 NOTE — Progress Notes (Addendum)
Olivia Woodward     MRN: [id number]      DOB: 1966-10-15  HPI: Patient is in for annual physical exam. Depression is addressed, overwhelmed at her wits end because her 2 children are stressing her out, have no regard for her as their Mom, despite her love and commitment for them C/o nodules on labia and in vaginal wall, she wants these removed, has seen gAyne in the past about this 4 day h/o itch and vaginal d/c , likely related to recent prednisone C/o weight gain, wants to change this Uncontrolled hTN is addressed Immunization is reviewed , needs shingles vaccines will addres at next visit   PE: BP (!) 146/98   Pulse 91   Resp 16   Ht 5\' 7"  (1.702 m)   Wt 246 lb (111.6 kg)   SpO2 98%   BMI 38.53 kg/m   Pleasant  female, alert and oriented x 3, in no cardio-pulmonary distress. Afebrile. HEENT No facial trauma or asymetry. Sinuses non tender.  Extra occullar muscles intact.. External ears normal, . Neck: supple, no adenopathy,JVD .No bruits.goiter present  Chest: Clear to ascultation bilaterally.No crackles or wheezes. Non tender to palpation  Breast: No asymetry,no masses or lumps. No tenderness. No nipple discharge or inversion. No axillary or supraclavicular adenopathy  Cardiovascular system; Heart sounds normal,  S1 and  S2 ,no S3.  No murmur, or thrill. Apical beat not displaced Peripheral pulses normal.  Abdomen: Soft, non tender, no organomegaly or masses. No bruits. Bowel sounds normal. No guarding, tenderness or rebound.   GU: External genitalia normal female genitalia , normal female distribution of hair. No lesions. Urethral meatus normal in size, no  Prolapse, no lesions visibly  Present. Bladder non tender. Vagina pink and moist , with no visible lesions ,physiologic  discharge present .Non tender Bartholin cyst Adequate pelvic support no  cystocele or rectocele noted  Uterus absent, no adnexal mass or tenderness   Musculoskeletal  exam: Full ROM of spine, hips , shoulders and knees. No deformity ,swelling or crepitus noted. No muscle wasting or atrophy.   Neurologic: Cranial nerves 2 to 12 intact. Power, tone ,sensation and reflexes normal throughout. No disturbance in gait. No tremor.  Skin: Intact, no ulceration, erythema , scaling or rash noted. Pigmentation normal throughout  Psych; Normal mood and affect. Judgement and concentration normal Positive depression screen, not suicidal or homicidal, currently being treated by Psychiatry  Assessment & Plan:  Essential hypertension Uncontrolled add spironolactone DASH diet and commitment to daily physical activity for a minimum of 30 minutes discussed and encouraged, as a part of hypertension management. The importance of attaining a healthy weight is also discussed.  BP/Weight 03/22/2021 03/20/2021 03/19/2021 10/26/2020 10/04/2020 09/11/2020 28/41/3244  Systolic BP 010 272 536 - 644 034 -  Diastolic BP 98 84 80 - 88 100 -  Wt. (Lbs) 246 - 235 250.8 250 256 233  BMI 38.53 - 36.81 39.28 39.16 42.6 37.61  Some encounter information is confidential and restricted. Go to Review Flowsheets activity to see all data.       IRON DEFIC ANEMIA [place] DIET IRON INTAKE Refer to therapy being treated by psych  Annual physical exam Annual exam as documented. Counseling done  re healthy lifestyle involving commitment to 150 minutes exercise per week, heart healthy diet, and attaining healthy weight.The importance of adequate sleep also discussed. Regular seat belt use and home safety, is also discussed. Changes in health habits are decided on by the patient with  goals and time frames  set for achieving them. Immunization and cancer screening needs are specifically addressed at this visit.   Depression, major, single episode, severe ([place]) Refer to therapist  Vaginitis and vulvovaginitis 4 day h/o acute itch and d/c likely aggravated by steroid, send for STD testing, rx  fluconazole x 2  Migraine headache Resolved, continue maintenance medication, work on stress reduction  Vitamin D deficiency Untreated, level is 13 in 03/2021, treat for at least 9 months  Bartholin gland cyst C/o recurrent lesion on labia and in vaginal ca nl, has had gyne eval in the past, wants 2nd opinion, and request Dr Garwin Brothers. Exam normal on day of visit

## 2021-03-23 NOTE — Assessment & Plan Note (Signed)
Low tsh and multinodular goiter, Endo to manage

## 2021-03-23 NOTE — Assessment & Plan Note (Signed)
Referred to ENt had biosy in 2021, pt reports normal, refer to Endo for f/u

## 2021-03-23 NOTE — Assessment & Plan Note (Signed)

## 2021-03-23 NOTE — Assessment & Plan Note (Signed)
Untreated, level is 13 in 03/2021, treat for at least 9 months

## 2021-03-23 NOTE — Assessment & Plan Note (Signed)
C/o recurrent lesion on labia and in vaginal ca nl, has had gyne eval in the past, wants 2nd opinion, and request Dr Garwin Brothers. Exam normal on day of visit

## 2021-03-23 NOTE — Assessment & Plan Note (Signed)
Refer to therapist

## 2021-03-23 NOTE — Assessment & Plan Note (Signed)
4 day h/o acute itch and d/c likely aggravated by steroid, send for STD testing, rx fluconazole x 2

## 2021-03-23 NOTE — Addendum Note (Signed)
Addended by: Tula Nakayama E on: 03/23/2021 11:29 AM   Modules accepted: Orders

## 2021-03-23 NOTE — Assessment & Plan Note (Signed)
Resolved, continue maintenance medication, work on stress reduction

## 2021-03-24 LAB — LIPID PANEL
Chol/HDL Ratio: 2.4 ratio (ref 0.0–4.4)
Cholesterol, Total: 164 mg/dL (ref 100–199)
HDL: 69 mg/dL (ref >39)
LDL Chol Calc (NIH): 81 mg/dL (ref 0–99)
Triglycerides: 73 mg/dL (ref 0–149)
VLDL Cholesterol Cal: 14 mg/dL (ref 5–40)

## 2021-03-24 LAB — CBC WITH DIFFERENTIAL
Basophils Absolute: 0 10*3/uL (ref 0.0–0.2)
Basos: 0 %
EOS (ABSOLUTE): 0.1 10*3/uL (ref 0.0–0.4)
Eos: 0 %
Hematocrit: 38.3 % (ref 34.0–46.6)
Hemoglobin: 12.7 g/dL (ref 11.1–15.9)
Immature Grans (Abs): 0.1 10*3/uL (ref 0.0–0.1)
Immature Granulocytes: 1 %
Lymphocytes Absolute: 1.7 10*3/uL (ref 0.7–3.1)
Lymphs: 13 %
MCH: 31.1 pg (ref 26.6–33.0)
MCHC: 33.2 g/dL (ref 31.5–35.7)
MCV: 94 fL (ref 79–97)
Monocytes Absolute: 0.4 10*3/uL (ref 0.1–0.9)
Monocytes: 3 %
Neutrophils Absolute: 10.7 10*3/uL — ABNORMAL HIGH (ref 1.4–7.0)
Neutrophils: 83 %
RBC: 4.08 x10E6/uL (ref 3.77–5.28)
RDW: 11.8 % (ref 11.7–15.4)
WBC: 12.9 10*3/uL — ABNORMAL HIGH (ref 3.4–10.8)

## 2021-03-24 LAB — CMP14+EGFR
ALT: 6 IU/L (ref 0–32)
AST: 13 IU/L (ref 0–40)
Albumin/Globulin Ratio: 1.1 — ABNORMAL LOW (ref 1.2–2.2)
Albumin: 3.9 g/dL (ref 3.8–4.9)
Alkaline Phosphatase: 59 IU/L (ref 44–121)
BUN/Creatinine Ratio: 13 (ref 9–23)
BUN: 13 mg/dL (ref 6–24)
Bilirubin Total: 0.3 mg/dL (ref 0.0–1.2)
CO2: 17 mmol/L — ABNORMAL LOW (ref 20–29)
Calcium: 9.4 mg/dL (ref 8.7–10.2)
Chloride: 103 mmol/L (ref 96–106)
Creatinine, Ser: 1.02 mg/dL — ABNORMAL HIGH (ref 0.57–1.00)
Globulin, Total: 3.7 g/dL (ref 1.5–4.5)
Glucose: 99 mg/dL (ref 65–99)
Potassium: 4.7 mmol/L (ref 3.5–5.2)
Sodium: 138 mmol/L (ref 134–144)
Total Protein: 7.6 g/dL (ref 6.0–8.5)
eGFR: 66 mL/min/{1.73_m2} (ref >59)

## 2021-03-24 LAB — HEMOGLOBIN A1C
Est. average glucose Bld gHb Est-mCnc: 108 mg/dL
Hgb A1c MFr Bld: 5.4 % (ref 4.8–5.6)

## 2021-03-24 LAB — HSV 2 ANTIBODY, IGG: HSV 2 IgG, Type Spec: 19.4 index — ABNORMAL HIGH (ref 0.00–0.90)

## 2021-03-24 LAB — VITAMIN D 25 HYDROXY (VIT D DEFICIENCY, FRACTURES): Vit D, 25-Hydroxy: 13.8 ng/mL — ABNORMAL LOW (ref 30.0–100.0)

## 2021-03-24 LAB — TSH: TSH: 0.409 u[IU]/mL — ABNORMAL LOW (ref 0.450–4.500)

## 2021-03-26 LAB — CYTOLOGY - PAP
Comment: NEGATIVE
Diagnosis: NEGATIVE
High risk HPV: NEGATIVE

## 2021-03-27 ENCOUNTER — Other Ambulatory Visit (HOSPITAL_COMMUNITY): Payer: Self-pay | Admitting: Psychiatry

## 2021-03-27 ENCOUNTER — Telehealth: Payer: Self-pay

## 2021-03-27 DIAGNOSIS — F419 Anxiety disorder, unspecified: Secondary | ICD-10-CM

## 2021-03-27 NOTE — Telephone Encounter (Signed)
Patient is having a very hard time processing the news you gave her yesterday about the HSV and she is all over the place and is having a very difficult time concentrating on her work and asks that you please give her a work note until you find out from cousins what is going on because she is worried sick. Please advise

## 2021-03-27 NOTE — Telephone Encounter (Signed)
Does she have an appt with Dr cousins yet, pls let me know

## 2021-03-28 ENCOUNTER — Telehealth: Payer: Self-pay | Admitting: Licensed Clinical Social Worker

## 2021-03-28 ENCOUNTER — Other Ambulatory Visit: Payer: Self-pay | Admitting: Family Medicine

## 2021-03-28 ENCOUNTER — Other Ambulatory Visit: Payer: Self-pay

## 2021-03-28 ENCOUNTER — Telehealth: Payer: Self-pay

## 2021-03-28 ENCOUNTER — Telehealth: Payer: PRIVATE HEALTH INSURANCE

## 2021-03-28 ENCOUNTER — Telehealth (HOSPITAL_COMMUNITY): Payer: Self-pay | Admitting: *Deleted

## 2021-03-28 DIAGNOSIS — R899 Unspecified abnormal finding in specimens from other organs, systems and tissues: Secondary | ICD-10-CM

## 2021-03-28 DIAGNOSIS — F3341 Major depressive disorder, recurrent, in partial remission: Secondary | ICD-10-CM

## 2021-03-28 NOTE — Telephone Encounter (Signed)
Patient called LVM on on Nurse Line " crying that she needs to talk with provider she's having a emergency"

## 2021-03-28 NOTE — Telephone Encounter (Signed)
Olivia Woodward called from Dr Garwin Brothers GYN office returning call says the patient has appointment scheduled Apr 06, 2021 at 9:30.

## 2021-03-28 NOTE — Telephone Encounter (Signed)
This was an urgent referral dr simpson wanted scheduled this am. Can u make sure the pt is aware of this?

## 2021-03-28 NOTE — BH Specialist Note (Signed)
Virtual Behavioral Health Treatment Plan Team Note  MRN: [id number] NAME: Olivia Woodward  DATE: 03/28/21  Start time:   405pEnd time:  410p Total time:  5 min  Total number of Virtual [place] Treatment Team Plan encounters: 1/4  Treatment Team Attendees: [name], LCSW & [name], Psychiatry  Diagnoses: No diagnosis found.  Goals, Interventions and Follow-up Plan Goals: Increase healthy adjustment to current life circumstances Interventions: Link to [employer] Medication Management Recommendations: n/a (she sees [name]) Follow-up Plan: [place] Phone Follow Up   History of the present illness Presenting Problem/Current Symptoms: symptoms of depression  Psychiatric History  Depression: Yes Anxiety: No Mania: No Psychosis: No PTSD symptoms: No  Past Psychiatric History/Hospitalization(s): Hospitalization for psychiatric illness: No Prior Suicide Attempts: No Prior Self-injurious behavior: No  Psychosocial stressors Flowsheet Row Virtual [place] Phone Follow Up from 08/04/2018 in [facility]  Current Stressors Family death  [Medication is not helping her level of depression and anxiety. ]  Familial Stressors None  Sleep Decreased, Difficulty falling asleep, Difficulty staying asleep      Self-harm Behaviors Risk Assessment Flowsheet Row Virtual BH Phone Follow Up from 07/16/2018 in [facility]  Self-harm risk factors --  [None Reported]  Have you recently had any thoughts about harming yourself? No      Screenings PHQ-9 Assessments:  Depression screen [facility] 2/9 03/22/2021 03/19/2021 11/20/2020  Decreased Interest 3 0 0  Down, Depressed, Hopeless 3 0 0  PHQ - 2 Score 6 0 0  Altered sleeping 3 - -  Tired, decreased energy 3 - -  Change in appetite 3 - -  Feeling bad or failure about yourself  3 - -  Trouble concentrating 3 - -  Moving slowly or fidgety/restless 2 - -  Suicidal thoughts 0 - -  PHQ-9 Score 23 - -  Difficult doing  work/chores - - -  Some recent data might be hidden   GAD-7 Assessments:  GAD 7 : Generalized Anxiety Score 03/17/2019 10/09/2018 08/12/2018 07/16/2018  Nervous, Anxious, on Edge 1 3 2 2   Control/stop worrying 1 3 2 2   Worry too much - different things 1 3 2 2   Trouble relaxing 1 3 1 2   Restless 0 3 2 1   Easily annoyed or irritable 0 3 1 1   Afraid - awful might happen 1 1 2 2   Total GAD 7 Score 5 19 12 12   Anxiety Difficulty - Extremely difficult Somewhat difficult Somewhat difficult    Past Medical History Past Medical History:  Diagnosis Date  . Anemia   . Anxiety    Phreesia 03/18/2021  . Depression    Phreesia 03/18/2021  . Generalized headaches   . Hypertension     Vital signs: There were no vitals filed for this visit.  Allergies:  Allergies as of 03/28/2021 - Review Complete 03/23/2021  Allergen Reaction Noted  . Ketorolac tromethamine Hives 03/16/2009    Medication History Current medications:  Outpatient Encounter Medications as of 03/28/2021  Medication Sig  . albuterol (VENTOLIN HFA) 108 (90 Base) MCG/ACT inhaler Inhale 2 puffs into the lungs every 6 (six) hours as needed for wheezing or shortness of breath.  . ALPRAZolam (XANAX) 0.25 MG tablet Take 1 tablet (0.25 mg total) by mouth 2 (two) times daily as needed for anxiety.  . ALPRAZolam (XANAX) 1 MG tablet Take 1 tablet (1 mg total) by mouth every evening.  Marland Kitchen amLODipine (NORVASC) 10 MG tablet Take 1 tablet (10 mg total) by mouth  daily.  . Cyanocobalamin (B-12) 2500 MCG TABS Take 1 tablet by mouth daily.  . ergocalciferol (VITAMIN D2) 1.25 MG (50000 UT) capsule Take 1 capsule (50,000 Units total) by mouth once a week. One capsule once weekly  . fluconazole (DIFLUCAN) 100 MG tablet Take 1 tablet (100 mg total) by mouth daily.  . ondansetron (ZOFRAN) 4 MG tablet TAKE 1 TABLET BY MOUTH EVERY 8 HOURS AS NEEDED FOR NAUSEA OR VOMITING  . predniSONE (DELTASONE) 10 MG tablet Take 1 tablet (10 mg total) by mouth 2 (two)  times daily with a meal.  . rizatriptan (MAXALT) 10 MG tablet Take 1 tablet (10 mg total) by mouth as needed for migraine. May repeat in 2 hours if needed  . spironolactone (ALDACTONE) 50 MG tablet Take 1 tablet (50 mg total) by mouth daily.  Marland Kitchen topiramate (TOPAMAX) 50 MG tablet Take 1 tablet (50 mg total) by mouth 2 (two) times daily.   No facility-administered encounter medications on file as of 03/28/2021.     Scribe for Treatment Team: Lubertha South, LCSW

## 2021-03-28 NOTE — Telephone Encounter (Signed)
I spoke with pt, needs work excuse from today to return next Monday, she is emotionally distraught, still has no gyne appt, will work on tha 'I again re iterate that the test MAY be false positive, and if not, this is does not reflect promiscuous behavior

## 2021-03-28 NOTE — Telephone Encounter (Signed)
I called pt and spoke with her. Pt is very distraught because she recently had her annual physical exam done and she tested positive for herpes. Writer looked at the labs also noticed that she was positive for HSV.  She stated that she does not know how that is possible because she has not had any sexual contact with anyone for the last 6 or 7 years.  She stated that this is very hard for her to believe.  Writer provided the patient with a lot of reassurance and told her that there are many other explanations for this one of them being the result could be false positive.   Writer noted that she has already spoken with her primary care provider Dr. Moshe Cipro regarding this who also provided her with a lot of reassurance.  Patient asked the writer how will she disclose this to her children and writer told her that this is not something that she needs to disclose to anyone as this is her personal history.  Writer provided her with a lot of reassurance and told her to follow-up with her OB/GYN.  She has already contacted the office to schedule an urgent appointment as per the EMR.

## 2021-03-28 NOTE — Telephone Encounter (Signed)
Work note sent via [employer]. If patient wishes to collect, will print

## 2021-03-28 NOTE — Telephone Encounter (Signed)
Patient aware.

## 2021-03-30 ENCOUNTER — Telehealth: Payer: Self-pay | Admitting: Licensed Clinical Social Worker

## 2021-03-30 DIAGNOSIS — F3341 Major depressive disorder, recurrent, in partial remission: Secondary | ICD-10-CM

## 2021-03-30 NOTE — Progress Notes (Signed)
This encounter was created in error - please disregard.

## 2021-03-30 NOTE — BH Specialist Note (Signed)
[place] Initial Clinical Assessment  MRN: [id number] NAME: Olivia Woodward Date: 03/28/21  Start time:   12pEnd time:  1230p Total time:  30 min Call number:  video visit  Type of Contact:  video Patient consent obtained:  yes Reason for Visit today:    Initiate VBH service  Treatment History Patient recently received Inpatient Treatment:   no  Facility/Program:    Date of discharge:   Patient currently being seen by therapist/psychiatrist:   Patient currently receiving the following services:    Past Psychiatric History/Hospitalization(s): Anxiety: No Bipolar Disorder: No Depression: Yes Mania: No Psychosis: No Schizophrenia: No Personality Disorder: No Hospitalization for psychiatric illness: No History of Electroconvulsive Shock Therapy: No Prior Suicide Attempts: No  Clinical Assessment:  PHQ-9 Assessments: Depression screen [facility] 2/9 03/22/2021 03/19/2021 11/20/2020  Decreased Interest 3 0 0  Down, Depressed, Hopeless 3 0 0  PHQ - 2 Score 6 0 0  Altered sleeping 3 - -  Tired, decreased energy 3 - -  Change in appetite 3 - -  Feeling bad or failure about yourself  3 - -  Trouble concentrating 3 - -  Moving slowly or fidgety/restless 2 - -  Suicidal thoughts 0 - -  PHQ-9 Score 23 - -  Difficult doing work/chores - - -  Some recent data might be hidden    GAD-7 Assessments: GAD 7 : Generalized Anxiety Score 03/17/2019 10/09/2018 08/12/2018 07/16/2018  Nervous, Anxious, on Edge 1 3 2 2   Control/stop worrying 1 3 2 2   Worry too much - different things 1 3 2 2   Trouble relaxing 1 3 1 2   Restless 0 3 2 1   Easily annoyed or irritable 0 3 1 1   Afraid - awful might happen 1 1 2 2   Total GAD 7 Score 5 19 12 12   Anxiety Difficulty - Extremely difficult Somewhat difficult Somewhat difficult     Social Functioning Social maturity:  WNL Social judgement:  WNL  Stress Current stressors:  medical diagnosis Familial stressors:  telling family her medical  diagnosis Sleep:  poor Appetite:  poor Coping ability:overwhelmed   Patient taking medications as prescribed:  yes  Current medications:  Outpatient Encounter Medications as of 03/30/2021  Medication Sig  . albuterol (VENTOLIN HFA) 108 (90 Base) MCG/ACT inhaler Inhale 2 puffs into the lungs every 6 (six) hours as needed for wheezing or shortness of breath.  . ALPRAZolam (XANAX) 0.25 MG tablet Take 1 tablet (0.25 mg total) by mouth 2 (two) times daily as needed for anxiety.  . ALPRAZolam (XANAX) 1 MG tablet Take 1 tablet (1 mg total) by mouth every evening.  Marland Kitchen amLODipine (NORVASC) 10 MG tablet Take 1 tablet (10 mg total) by mouth daily.  . Cyanocobalamin (B-12) 2500 MCG TABS Take 1 tablet by mouth daily.  . ergocalciferol (VITAMIN D2) 1.25 MG (50000 UT) capsule Take 1 capsule (50,000 Units total) by mouth once a week. One capsule once weekly  . fluconazole (DIFLUCAN) 100 MG tablet Take 1 tablet (100 mg total) by mouth daily.  . ondansetron (ZOFRAN) 4 MG tablet TAKE 1 TABLET BY MOUTH EVERY 8 HOURS AS NEEDED FOR NAUSEA OR VOMITING  . predniSONE (DELTASONE) 10 MG tablet Take 1 tablet (10 mg total) by mouth 2 (two) times daily with a meal.  . rizatriptan (MAXALT) 10 MG tablet Take 1 tablet (10 mg total) by mouth as needed for migraine. May repeat in 2 hours if needed  . spironolactone (ALDACTONE) 50 MG tablet  Take 1 tablet (50 mg total) by mouth daily.  Marland Kitchen topiramate (TOPAMAX) 50 MG tablet Take 1 tablet (50 mg total) by mouth 2 (two) times daily.   No facility-administered encounter medications on file as of 03/30/2021.    Self-harm Behaviors Risk Assessment Self-harm risk factors:   Patient endorses recent thoughts of harming self:    [name] Suicide Severity Rating Scale:  C-SRSS 08-04-18  1. Wish to be Dead No  2. Suicidal Thoughts No  6. Suicide Behavior Question No    Danger to Others Risk Assessment Danger to others risk factors:   Patient endorses recent thoughts of harming  others:    Dynamic Appraisal of Situational Aggression (DASA): No flowsheet data found.  Substance Use Assessment Patient recently consumed alcohol:    Alcohol Use Disorder Identification Test (AUDIT):  Alcohol Use Disorder Test (AUDIT) 08/04/2018  1. How often do you have a drink containing alcohol? 0  2. How many drinks containing alcohol do you have on a typical day when you are drinking? 0  3. How often do you have six or more drinks on one occasion? 0  AUDIT-C Score 0  Alcohol Brief Interventions/Follow-up AUDIT Score <7 follow-up not indicated   Patient recently used drugs:    Opioid Risk Assessment:  Patient is concerned about dependence or abuse of substances:    ASAM Multidimensional Assessment Summary:  Dimension 1:    Dimension 1 Rating:    Dimension 2:    Dimension 2 Rating:    Dimension 3:    Dimension 3 Rating:    Dimension 4:    Dimension 4 Rating:    Dimension 5:    Dimension 5 Rating:    Dimension 6:    Dimension 6 Rating:   ASAM's Severity Rating Score:   ASAM Recommended Level of Treatment:     Goals, Interventions and Follow-up Plan Goals: Increase healthy adjustment to current life circumstances Interventions: Mindfulness or Relaxation Training Follow-up Plan: Refer to [facility]  Summary of Clinical Assessment Summary: Olivia Woodward cried throughout the session.  She was upset about recent medical diagnosis.  She reports that she has suffered from depression for several years & this recent diagnosis has exacerbated her symptoms.  Patient has a therapist and psychiatrist.  Encouraged Patient to make an appt with her therapist.   Lubertha South, LCSW

## 2021-04-02 ENCOUNTER — Ambulatory Visit (HOSPITAL_COMMUNITY): Payer: PRIVATE HEALTH INSURANCE

## 2021-04-02 NOTE — Progress Notes (Signed)
[facility] Psychiatric Consultant Case Review   Olivia Woodward is a 55 y.o. year old female with a history of depression, iron deficiency anemia, multinodular goiter, vitamin D deficiency, hypertension, s/p gastric bypass surgery in 2017. Worsening in depression/anxiety in the context of receiving the test done for vaginitis. She was referred to Ob/Gyn.   Assessment/Provisional Diagnosis # MDD She sees a psychiatrist, and she may be followed by a therapist according to the patient. Will ensure follow up.   Recommendation - Will ensure she will be followed by her psychiatrist/therapist (if she has).  - [place] specialist to continue supportive therapy if she does not have a therapist.    Thank you for your consult. Please contact [place]  for any questions or concerns.   The above treatment considerations and suggestions are based on consultation with the [facility] specialist and/or PCP and a review of information available in the shared registry and the patient's [place] Record (EHR). I have not personally examined the patient. All recommendations should be implemented with consideration of the patient's relevant prior history and current clinical status. Please feel free to call me with any questions about the care of this patient.

## 2021-04-07 ENCOUNTER — Other Ambulatory Visit: Payer: Self-pay | Admitting: Psychiatry

## 2021-04-07 DIAGNOSIS — F3341 Major depressive disorder, recurrent, in partial remission: Secondary | ICD-10-CM

## 2021-04-07 DIAGNOSIS — F419 Anxiety disorder, unspecified: Secondary | ICD-10-CM

## 2021-04-11 ENCOUNTER — Ambulatory Visit: Payer: PRIVATE HEALTH INSURANCE | Admitting: "Endocrinology

## 2021-04-11 ENCOUNTER — Encounter (HOSPITAL_COMMUNITY): Payer: Self-pay | Admitting: Hematology

## 2021-04-18 ENCOUNTER — Telehealth: Payer: Self-pay | Admitting: Licensed Clinical Social Worker

## 2021-04-18 ENCOUNTER — Telehealth (INDEPENDENT_AMBULATORY_CARE_PROVIDER_SITE_OTHER): Payer: 59 | Admitting: Licensed Clinical Social Worker

## 2021-04-18 DIAGNOSIS — F3341 Major depressive disorder, recurrent, in partial remission: Secondary | ICD-10-CM

## 2021-04-18 NOTE — Telephone Encounter (Signed)
Patient has a Teacher, music, [name] and a therapist outside of [facility]

## 2021-04-18 NOTE — BH Specialist Note (Signed)
Virtual Behavioral Health Treatment Plan Team Note  MRN: [id number] NAME: Olivia Woodward  DATE: 04/18/21  Start time: 310p  End time:  320p Total time:  10 min  Total number of Virtual [place] Treatment Team Plan encounters: 1/4  Treatment Team Attendees: [name], LCSW & [name], Psychiatrist  Diagnoses: No diagnosis found.  Goals, Interventions and Follow-up Plan Goals: Increase healthy adjustment to current life circumstances Interventions: Mindfulness or Relaxation Training Medication Management Recommendations: will sign off as she has a Teacher, music [name] and a therapist outside [place]: Refer to [facility]  History of the present illness Presenting Problem/Current Symptoms: depressive symptoms & forgetfulness  Psychiatric History  Depression: Yes Anxiety: No Mania: No Psychosis: No PTSD symptoms: No  Past Psychiatric History/Hospitalization(s): Hospitalization for psychiatric illness: No Prior Suicide Attempts: No Prior Self-injurious behavior: No  Psychosocial stressors Flowsheet Row Virtual [place] Phone Follow Up from 08/04/2018 in [facility]  Current Stressors Family death  [Medication is not helping her level of depression and anxiety. ]  Familial Stressors None  Sleep Decreased, Difficulty falling asleep, Difficulty staying asleep       Self-harm Behaviors Risk Assessment Flowsheet Row Virtual BH Phone Follow Up from 07/16/2018 in [facility]  Self-harm risk factors --  [None Reported]  Have you recently had any thoughts about harming yourself? No       Screenings PHQ-9 Assessments:  Depression screen [facility] 2/9 03/22/2021 03/19/2021 11/20/2020  Decreased Interest 3 0 0  Down, Depressed, Hopeless 3 0 0  PHQ - 2 Score 6 0 0  Altered sleeping 3 - -  Tired, decreased energy 3 - -  Change in appetite 3 - -  Feeling bad or failure about yourself  3 - -  Trouble concentrating 3 - -  Moving slowly or  fidgety/restless 2 - -  Suicidal thoughts 0 - -  PHQ-9 Score 23 - -  Difficult doing work/chores - - -  Some recent data might be hidden   GAD-7 Assessments:  GAD 7 : Generalized Anxiety Score 03/17/2019 10/09/2018 08/12/2018 07/16/2018  Nervous, Anxious, on Edge 1 3 2 2   Control/stop worrying 1 3 2 2   Worry too much - different things 1 3 2 2   Trouble relaxing 1 3 1 2   Restless 0 3 2 1   Easily annoyed or irritable 0 3 1 1   Afraid - awful might happen 1 1 2 2   Total GAD 7 Score 5 19 12 12   Anxiety Difficulty - Extremely difficult Somewhat difficult Somewhat difficult    Past Medical History Past Medical History:  Diagnosis Date   Anemia    Anxiety    Phreesia 03/18/2021   Depression    Phreesia 03/18/2021   Generalized headaches    Hypertension     Vital signs: There were no vitals filed for this visit.  Allergies:  Allergies as of 04/18/2021 - Review Complete 03/23/2021  Allergen Reaction Noted   Ketorolac tromethamine Hives 03/16/2009    Medication History Current medications:  Outpatient Encounter Medications as of 04/18/2021  Medication Sig   albuterol (VENTOLIN HFA) 108 (90 Base) MCG/ACT inhaler Inhale 2 puffs into the lungs every 6 (six) hours as needed for wheezing or shortness of breath.   ALPRAZolam (XANAX) 0.25 MG tablet Take 1 tablet (0.25 mg total) by mouth 2 (two) times daily as needed for anxiety.   ALPRAZolam (XANAX) 1 MG tablet Take 1 tablet (1 mg total) by mouth every evening.  amLODipine (NORVASC) 10 MG tablet Take 1 tablet (10 mg total) by mouth daily.   Cyanocobalamin (B-12) 2500 MCG TABS Take 1 tablet by mouth daily.   ergocalciferol (VITAMIN D2) 1.25 MG (50000 UT) capsule Take 1 capsule (50,000 Units total) by mouth once a week. One capsule once weekly   escitalopram (LEXAPRO) 20 MG tablet Take 1 tablet by mouth once daily   fluconazole (DIFLUCAN) 100 MG tablet Take 1 tablet (100 mg total) by mouth daily.   ondansetron (ZOFRAN) 4 MG tablet TAKE 1  TABLET BY MOUTH EVERY 8 HOURS AS NEEDED FOR NAUSEA OR VOMITING   predniSONE (DELTASONE) 10 MG tablet Take 1 tablet (10 mg total) by mouth 2 (two) times daily with a meal.   rizatriptan (MAXALT) 10 MG tablet Take 1 tablet (10 mg total) by mouth as needed for migraine. May repeat in 2 hours if needed   spironolactone (ALDACTONE) 50 MG tablet Take 1 tablet (50 mg total) by mouth daily.   topiramate (TOPAMAX) 50 MG tablet Take 1 tablet (50 mg total) by mouth 2 (two) times daily.   No facility-administered encounter medications on file as of 04/18/2021.     Scribe for Treatment Team: Lubertha South, LCSW

## 2021-04-26 ENCOUNTER — Ambulatory Visit: Payer: PRIVATE HEALTH INSURANCE | Admitting: "Endocrinology

## 2021-04-27 ENCOUNTER — Encounter (HOSPITAL_COMMUNITY): Payer: Self-pay | Admitting: Hematology

## 2021-05-03 ENCOUNTER — Encounter: Payer: Self-pay | Admitting: Internal Medicine

## 2021-05-03 ENCOUNTER — Other Ambulatory Visit: Payer: Self-pay

## 2021-05-03 ENCOUNTER — Telehealth (INDEPENDENT_AMBULATORY_CARE_PROVIDER_SITE_OTHER): Payer: 59 | Admitting: Internal Medicine

## 2021-05-03 DIAGNOSIS — Z20822 Contact with and (suspected) exposure to covid-19: Secondary | ICD-10-CM

## 2021-05-03 DIAGNOSIS — J069 Acute upper respiratory infection, unspecified: Secondary | ICD-10-CM | POA: Diagnosis not present

## 2021-05-03 MED ORDER — PROMETHAZINE-DM 6.25-15 MG/5ML PO SYRP: 5.0000 mL | ORAL_SOLUTION | Freq: Four times a day (QID) | ORAL | 0 refills | Status: DC | PRN

## 2021-05-03 NOTE — Progress Notes (Signed)
Virtual Visit via Telephone Note   This visit type was conducted due to national recommendations for restrictions regarding the COVID-19 Pandemic (e.g. social distancing) in an effort to limit this patient's exposure and mitigate transmission in our community.  Due to her co-morbid illnesses, this patient is at least at moderate risk for complications without adequate follow up.  This format is felt to be most appropriate for this patient at this time.  The patient did not have access to video technology/had technical difficulties with video requiring transitioning to audio format only (telephone).  All issues noted in this document were discussed and addressed.  No physical exam could be performed with this format.  Evaluation Performed:  Follow-up visit  Date:  05/03/2021   ID:  Olivia Woodward, DOB [birth year], MRN [id number]  Patient Location: Home Provider Location: Office/Clinic  Participants: Patient Location of Patient: Home Location of Provider: Telehealth Consent was obtain for visit to be over via telehealth. I verified that I am speaking with the correct person using two identifiers.  PCP:  Fayrene Helper, MD   Chief Complaint:  Cough and nasal congestion  History of Present Illness:    Olivia Woodward is a 55 y.o. female who has a televisit for c/o cough and nasal congestion for about 2-3 days. She denies any fever, chills, dyspnea or wheezing. Denies any recent sick contacts. She has had 2 doses of COVID vaccine.  The patient does not have symptoms concerning for COVID-19 infection (fever, chills, cough, or new shortness of breath).   Past Medical, Surgical, Social History, Allergies, and Medications have been Reviewed.  Past Medical History:  Diagnosis Date   Anemia    Anxiety    Phreesia 03/18/2021   Depression    Phreesia 03/18/2021   Generalized headaches    Hypertension    Past Surgical History:  Procedure Laterality Date   ABDOMINAL HYSTERECTOMY      fibroids, partial   BREAST REDUCTION SURGERY  2005   COLONOSCOPY WITH PROPOFOL N/A 05/10/2019   Procedure: COLONOSCOPY WITH PROPOFOL;  Surgeon: Daneil Dolin, MD;  [facility]: [facility];  Service: Endoscopy;  Laterality: N/A;  2:45pm   GASTRIC BYPASS  2007   POLYPECTOMY  05/10/2019   Procedure: POLYPECTOMY;  Surgeon: Daneil Dolin, MD;  Location: [facility];  Service: Endoscopy;;  colon     Current Meds  Medication Sig   albuterol (VENTOLIN HFA) 108 (90 Base) MCG/ACT inhaler Inhale 2 puffs into the lungs every 6 (six) hours as needed for wheezing or shortness of breath.   ALPRAZolam (XANAX) 0.25 MG tablet Take 1 tablet (0.25 mg total) by mouth 2 (two) times daily as needed for anxiety.   ALPRAZolam (XANAX) 1 MG tablet Take 1 tablet (1 mg total) by mouth every evening.   amLODipine (NORVASC) 10 MG tablet Take 1 tablet (10 mg total) by mouth daily.   Cyanocobalamin (B-12) 2500 MCG TABS Take 1 tablet by mouth daily.   ergocalciferol (VITAMIN D2) 1.25 MG (50000 UT) capsule Take 1 capsule (50,000 Units total) by mouth once a week. One capsule once weekly   escitalopram (LEXAPRO) 20 MG tablet Take 1 tablet by mouth once daily   fluconazole (DIFLUCAN) 100 MG tablet Take 1 tablet (100 mg total) by mouth daily.   ondansetron (ZOFRAN) 4 MG tablet TAKE 1 TABLET BY MOUTH EVERY 8 HOURS AS NEEDED FOR NAUSEA OR VOMITING   predniSONE (DELTASONE) 10 MG tablet Take 1 tablet (10 mg  total) by mouth 2 (two) times daily with a meal.   promethazine-dextromethorphan (PROMETHAZINE-DM) 6.25-15 MG/5ML syrup Take 5 mLs by mouth 4 (four) times daily as needed for cough.   rizatriptan (MAXALT) 10 MG tablet Take 1 tablet (10 mg total) by mouth as needed for migraine. May repeat in 2 hours if needed   spironolactone (ALDACTONE) 50 MG tablet Take 1 tablet (50 mg total) by mouth daily.   topiramate (TOPAMAX) 50 MG tablet Take 1 tablet (50 mg total) by mouth 2 (two) times daily.     Allergies:   Ketorolac  tromethamine   ROS:   Please see the history of present illness.     All other systems reviewed and are negative.   Labs/Other Tests and Data Reviewed:    Recent Labs: 10/04/2020: B Natriuretic Peptide 23.0; Platelets 374 03/22/2021: ALT 6; BUN 13; Creatinine, Ser 1.02; Hemoglobin 12.7; Potassium 4.7; Sodium 138; TSH 0.409   Recent Lipid Panel Lab Results  Component Value Date/Time   CHOL 164 03/22/2021 09:30 AM   TRIG 73 03/22/2021 09:30 AM   HDL 69 03/22/2021 09:30 AM   CHOLHDL 2.4 03/22/2021 09:30 AM   CHOLHDL 2.0 12/27/2017 09:49 AM   LDLCALC 81 03/22/2021 09:30 AM   LDLCALC 71 12/27/2017 09:49 AM    Wt Readings from Last 3 Encounters:  03/22/21 246 lb (111.6 kg)  03/19/21 235 lb (106.6 kg)  10/26/20 250 lb 12.8 oz (113.8 kg)     ASSESSMENT & PLAN:    URTI Suspected COVID-19 infection Has tried dayquil and nyquil Prescribed Promethazine-DM syrup If persistent symptoms with negative COVID test, will prescribe antibiotic  Time:   Today, I have spent 8 minutes reviewing the chart, including problem list, medications, and with the patient with telehealth technology discussing the above problems.   Medication Adjustments/Labs and Tests Ordered: Current medicines are reviewed at length with the patient today.  Concerns regarding medicines are outlined above.   Tests Ordered: No orders of the defined types were placed in this encounter.   Medication Changes: Meds ordered this encounter  Medications   promethazine-dextromethorphan (PROMETHAZINE-DM) 6.25-15 MG/5ML syrup    Sig: Take 5 mLs by mouth 4 (four) times daily as needed for cough.    Dispense:  118 mL    Refill:  0     Note: This dictation was prepared with Dragon dictation along with smaller phrase technology. Similar sounding words can be transcribed inadequately or may not be corrected upon review. Any transcriptional errors that result from this process are unintentional.      Disposition:  Follow  up  Signed, Lindell Spar, MD  05/03/2021 3:06 PM     [employer]

## 2021-05-04 ENCOUNTER — Encounter: Payer: Self-pay | Admitting: "Endocrinology

## 2021-05-04 ENCOUNTER — Other Ambulatory Visit: Payer: Self-pay

## 2021-05-04 ENCOUNTER — Ambulatory Visit (INDEPENDENT_AMBULATORY_CARE_PROVIDER_SITE_OTHER): Payer: PRIVATE HEALTH INSURANCE | Admitting: "Endocrinology

## 2021-05-04 ENCOUNTER — Encounter (HOSPITAL_COMMUNITY): Payer: Self-pay | Admitting: Psychiatry

## 2021-05-04 ENCOUNTER — Telehealth (INDEPENDENT_AMBULATORY_CARE_PROVIDER_SITE_OTHER): Payer: PRIVATE HEALTH INSURANCE | Admitting: Psychiatry

## 2021-05-04 VITALS — BP 129/90 | HR 78 | Ht 67.0 in | Wt 251.2 lb

## 2021-05-04 DIAGNOSIS — E0590 Thyrotoxicosis, unspecified without thyrotoxic crisis or storm: Secondary | ICD-10-CM | POA: Insufficient documentation

## 2021-05-04 DIAGNOSIS — E042 Nontoxic multinodular goiter: Secondary | ICD-10-CM | POA: Diagnosis not present

## 2021-05-04 DIAGNOSIS — F331 Major depressive disorder, recurrent, moderate: Secondary | ICD-10-CM

## 2021-05-04 DIAGNOSIS — F419 Anxiety disorder, unspecified: Secondary | ICD-10-CM | POA: Diagnosis not present

## 2021-05-04 MED ORDER — ALPRAZOLAM 1 MG PO TABS: 1.0000 mg | ORAL_TABLET | Freq: Every evening | ORAL | 1 refills | Status: DC

## 2021-05-04 MED ORDER — ALPRAZOLAM 0.25 MG PO TABS
0.2500 mg | ORAL_TABLET | Freq: Two times a day (BID) | ORAL | 1 refills | Status: DC | PRN
Start: 2021-05-04 — End: 2021-06-25

## 2021-05-04 MED ORDER — VENLAFAXINE HCL ER 75 MG PO CP24: ORAL_CAPSULE | ORAL | 1 refills | Status: DC

## 2021-05-04 NOTE — Progress Notes (Signed)
Endocrinology Consult Note                                            05/04/2021, 9:24 AM   Subjective:    Patient ID: Olivia Woodward, female    DOB: 08-Oct-1966, PCP Fayrene Helper, MD   Past Medical History:  Diagnosis Date   Anemia    Anxiety    Phreesia 03/18/2021   Depression    Phreesia 03/18/2021   Generalized headaches    Hypertension    Past Surgical History:  Procedure Laterality Date   ABDOMINAL HYSTERECTOMY     fibroids, partial   BREAST REDUCTION SURGERY  2005   COLONOSCOPY WITH PROPOFOL N/A 05/10/2019   Procedure: COLONOSCOPY WITH PROPOFOL;  Surgeon: Daneil Dolin, MD;  [facility];  Service: Endoscopy;  Laterality: N/A;  2:45pm   GASTRIC BYPASS  2007   POLYPECTOMY  05/10/2019   Procedure: POLYPECTOMY;  Surgeon: Daneil Dolin, MD;  Location: [facility];  Service: Endoscopy;;  colon   Social History   Socioeconomic History   Marital status: Single    Spouse name: Not on file   Number of children: Not on file   Years of education: Not on file   Highest education level: Not on file  Occupational History   Not on file  Tobacco Use   Smoking status: Never   Smokeless tobacco: Never  Substance and Sexual Activity   Alcohol use: No   Drug use: No   Sexual activity: Yes    Birth control/protection: Condom  Other Topics Concern   Not on file  Social History Narrative   Not on file   Social Determinants of Health   Financial Resource Strain: Not on file  Food Insecurity: Not on file  Transportation Needs: Not on file  Physical Activity: Not on file  Stress: Not on file  Social Connections: Not on file   Family History  Problem Relation Age of Onset   Hypertension Mother    Diabetes Mother    Drug abuse Mother    Hypertension Father    Hypertension Sister    Hypertension Sister    Colon cancer Neg Hx    Outpatient Encounter Medications as of 05/04/2021  Medication Sig   albuterol (VENTOLIN HFA) 108 (90 Base)  MCG/ACT inhaler Inhale 2 puffs into the lungs every 6 (six) hours as needed for wheezing or shortness of breath.   ALPRAZolam (XANAX) 0.25 MG tablet Take 1 tablet (0.25 mg total) by mouth 2 (two) times daily as needed for anxiety.   ALPRAZolam (XANAX) 1 MG tablet Take 1 tablet (1 mg total) by mouth every evening.   amLODipine (NORVASC) 10 MG tablet Take 1 tablet (10 mg total) by mouth daily.   Cyanocobalamin (B-12) 2500 MCG TABS Take 1 tablet by mouth daily.   ergocalciferol (VITAMIN D2) 1.25 MG (50000 UT) capsule Take 1 capsule (50,000 Units total) by mouth once a week. One capsule once weekly   escitalopram (LEXAPRO) 20 MG tablet Take 1 tablet by mouth once daily   fluconazole (DIFLUCAN) 100 MG tablet Take 1 tablet (100 mg total) by mouth daily.   ondansetron (ZOFRAN) 4 MG tablet TAKE 1 TABLET BY MOUTH EVERY 8 HOURS AS NEEDED FOR NAUSEA OR VOMITING   predniSONE (DELTASONE) 10 MG tablet Take 1 tablet (10 mg  total) by mouth 2 (two) times daily with a meal.   promethazine-dextromethorphan (PROMETHAZINE-DM) 6.25-15 MG/5ML syrup Take 5 mLs by mouth 4 (four) times daily as needed for cough.   rizatriptan (MAXALT) 10 MG tablet Take 1 tablet (10 mg total) by mouth as needed for migraine. May repeat in 2 hours if needed   spironolactone (ALDACTONE) 50 MG tablet Take 1 tablet (50 mg total) by mouth daily.   topiramate (TOPAMAX) 50 MG tablet Take 1 tablet (50 mg total) by mouth 2 (two) times daily.   No facility-administered encounter medications on file as of 05/04/2021.   ALLERGIES: Allergies  Allergen Reactions   Ketorolac Tromethamine Hives    Lips swelled    VACCINATION STATUS: Immunization History  Administered Date(s) Administered   Influenza Whole 08/24/2009, 07/25/2010, 09/28/2016   Influenza, High Dose Seasonal PF 11/18/2020   Influenza,inj,Quad PF,6+ Mos 07/09/2018, 07/19/2019   Janssen (J&J) SARS-COV-2 Vaccination 01/30/2020, 11/18/2020   Td 07/25/2010    HPI Olivia Woodward is  55 y.o. female who presents today with a medical history as above. she is being seen in consultation for multinodular goiter, suppressed TSH requested by Fayrene Helper, MD.  History is obtained directly from the patient, as well as chart review.  She is known to have minimally suppressed TSH for at least 3 years.  She was never offered treatment for treating hypothyroidism. She is also known to have multinodular goiter at least from 2021.  She underwent biopsy of 1 nodule on the right lobe with benign outcomes. She denies palpitations, tremors, nor heat intolerance. She denies any dysphagia, shortness of breath, nor voice change. Her last ultrasound was from October 2021 when she had 5.5 cm right lobe with 3 nodules measuring 1.4 cm, 1.7 cm, 2.0 cm.  Appears that the 2 cm nodule was biopsied with benign outcome.  Left lobe measured 5.6 cm with 2 nodules measuring 1.6 cm and 1.3 cm.    She reports progressive weight gain, gaining approximately 10 pounds since last year.  Review of Systems  Constitutional: + Progressive weight gain, no fatigue, no subjective hyperthermia, no subjective hypothermia Eyes: no blurry vision, no xerophthalmia ENT: no sore throat, no nodules palpated in throat, no dysphagia/odynophagia, no hoarseness Cardiovascular: no Chest Pain, no Shortness of Breath, no palpitations, no leg swelling Respiratory: no cough, no shortness of breath Gastrointestinal: no Nausea/Vomiting/Diarhhea Musculoskeletal: no muscle/joint aches Skin: no rashes Neurological: no tremors, no numbness, no tingling, no dizziness Psychiatric: no depression, no anxiety  Objective:    Vitals with BMI 05/04/2021 03/22/2021 03/20/2021  Height 5\' 7"  5\' 7"  -  Weight 251 lbs 3 oz 246 lbs -  BMI 07.37 10.62 -  Systolic 694 854 627  Diastolic 90 98 84  Pulse 78 91 80  Some encounter information is confidential and restricted. Go to Review Flowsheets activity to see all data.    BP 129/90   Pulse 78    Ht 5\' 7"  (1.702 m)   Wt 251 lb 3.2 oz (113.9 kg)   BMI 39.34 kg/m   Wt Readings from Last 3 Encounters:  05/04/21 251 lb 3.2 oz (113.9 kg)  03/22/21 246 lb (111.6 kg)  03/19/21 235 lb (106.6 kg)    Physical Exam  Constitutional:  Body mass index is 39.34 kg/m.,  not in acute distress, normal state of mind Eyes: PERRLA, EOMI, no exophthalmos ENT: moist mucous membranes, + gross thyromegaly, no gross cervical lymphadenopathy Cardiovascular: normal precordial activity, Regular Rate and Rhythm, no Murmur/Rubs/Gallops  Respiratory:  adequate breathing efforts, no gross chest deformity, Clear to auscultation bilaterally Gastrointestinal: abdomen soft, Non -tender, No distension, Bowel Sounds present, no gross organomegaly Musculoskeletal: no gross deformities, strength intact in all four extremities Skin: moist, warm, no rashes Neurological: no tremor with outstretched hands, Deep tendon reflexes normal in bilateral lower extremities.  CMP ( most recent) CMP     Component Value Date/Time   NA 138 03/22/2021 0930   K 4.7 03/22/2021 0930   CL 103 03/22/2021 0930   CO2 17 (L) 03/22/2021 0930   GLUCOSE 99 03/22/2021 0930   GLUCOSE 91 10/04/2020 1618   BUN 13 03/22/2021 0930   CREATININE 1.02 (H) 03/22/2021 0930   CREATININE 0.71 07/10/2018 1015   CALCIUM 9.4 03/22/2021 0930   PROT 7.6 03/22/2021 0930   ALBUMIN 3.9 03/22/2021 0930   AST 13 03/22/2021 0930   ALT 6 03/22/2021 0930   ALKPHOS 59 03/22/2021 0930   BILITOT 0.3 03/22/2021 0930   GFRNONAA >60 10/04/2020 1618   GFRNONAA 100 07/10/2018 1015   GFRAA 87 09/06/2020 0843   GFRAA 116 07/10/2018 1015     Diabetic Labs (most recent): Lab Results  Component Value Date   HGBA1C 5.4 03/22/2021   HGBA1C 5.3 07/26/2010     Lipid Panel ( most recent) Lipid Panel     Component Value Date/Time   CHOL 164 03/22/2021 0930   TRIG 73 03/22/2021 0930   HDL 69 03/22/2021 0930   CHOLHDL 2.4 03/22/2021 0930   CHOLHDL 2.0  12/27/2017 0949   VLDL 10 01/02/2017 1536   LDLCALC 81 03/22/2021 0930   LDLCALC 71 12/27/2017 0949   LABVLDL 14 03/22/2021 0930      Lab Results  Component Value Date   TSH 0.409 (L) 03/22/2021   TSH 0.278 (L) 09/06/2020   TSH 0.279 (L) 09/06/2020   TSH 0.375 11/19/2018   TSH 0.339 (L) 10/26/2018   TSH 0.73 12/27/2017   TSH 0.58 01/02/2017   TSH 0.912 12/28/2008   FREET4 1.12 09/06/2020   FREET4 CANCELED 10/26/2018   FREET4 1.10 07/26/2010      Assessment & Plan:   1. Multinodular goiter 2. Subclinical hyperthyroidism   - ANZLEY DIBBERN  is being seen at a kind request of Fayrene Helper, MD. - I have reviewed her available thyroid records and clinically evaluated the patient. - Based on these reviews, she has multinodular goiter and subclinical hyperthyroidism,  however,  there is not sufficient information to proceed with definitive treatment plan. She has history of benign FNA from one of the right sided nodules in 2021. She will not need antithyroid intervention based on her thyroid function tests, however will need thyroid uptake and scan to characterize her thyroid nodules better.  If she has cold nodules, she will be considered for repeat biopsy.  If her uptake is suggestive of primary hyperthyroidism, she will be considered for definitive antithyroid intervention.  Thyroid uptake and scan will be ordered to be done in [facility] in the next several days. - I did not initiate any new prescriptions today. - she is advised to maintain close follow up with Fayrene Helper, MD for primary care needs.   - Time spent with the patient: 60 minutes, of which >50% was spent in  counseling her about her multinodular goiter, subclinical hyperthyroidism and the rest in obtaining information about her symptoms, reviewing her previous labs/studies ( including abstractions from other facilities),  evaluations, and treatments,  and developing a  plan to confirm  diagnosis and long term treatment based on the latest standards of care/guidelines; and documenting her care.  Olivia Woodward participated in the discussions, expressed understanding, and voiced agreement with the above plans.  All questions were answered to her satisfaction. she is encouraged to contact clinic should she have any questions or concerns prior to her return visit.  Follow up plan: Return in about 1 week (around 05/11/2021) for F/U with Thyroid Uptake and Scan.   Glade Lloyd, MD [facility] [address] [place], [place] [address] Phone: [phone]  Fax: [phone]     05/04/2021, 9:24 AM  This note was partially dictated with voice recognition software. Similar sounding words can be transcribed inadequately or may not  be corrected upon review.

## 2021-05-04 NOTE — Progress Notes (Signed)
[facility] MD OP Progress Note    Virtual Visit via Video Note  I connected with Olivia Woodward on 05/04/21 at  9:00 AM EDT by a video enabled telemedicine application and verified that I am speaking with the correct person using two identifiers.  Location: Patient: Home Provider: Clinic   I discussed the limitations of evaluation and management by telemedicine and the availability of in person appointments. The patient expressed understanding and agreed to proceed.  I provided 22 minutes of non-face-to-face time during this encounter.      05/04/2021 9:51 AM Olivia Woodward  MRN:  [id number]  Chief Complaint:  " I am very depressed.  I do not have any coping skills."  HPI: Patient seen for follow-up today.  Patient looked very upset today.  She stated that she just got out of her endocrinology appointment and she will need to get her thyroids removed she was diagnosed with multinodular goiter. She stated that she has been very depressed.  She is very upset because her children are not doing well.  Her relationship with them is getting worse.  She stated that she is feeling very distraught.  She denied having any suicidal ideations but stated that she needs to take a mental break.  She asked the writer if the writer would be willing to give her time off from work. Writer asked her how her therapy sessions are going and she stated that she does not recall talking to any therapist.  Writer reminded her of her virtual sessions with Ms. [name] in the [facility] primary care clinic and she stated that all she recalls is someone asking her if she can come in for therapy sessions and that is all.  She stated that she is not seeing anyone else other than the Probation officer. She stated that she feels like her life is falling apart.  She stated she is not having any intent to hurt herself but she just needs a break because nothing is working out.  She then repeated that her life is crumbling few  times.  Writer asked her if she was agreeable to trying a different antidepressant other than Lexapro.  Patient has insisted that Lexapro is only medicine that has helped her in the past and therefore has been hesitant to change it.  However today she stated that she is agreeable to trying something else as it is not helping. Writer recommended trial of Effexor and she stated that she may have taken it in the past but is willing to try it again.Potential side effects of medication and risks vs benefits of treatment vs non-treatment were explained and discussed. All questions were answered.  Writer recommended that since she has been on Lexapro 20 mg dose for more than a year it is recommended that she tapers it off to 10 mg for a week and then discontinues.  She was explained that once she has stopped the Lexapro completely after a week she can start Effexor.  We will start with 75 mg for 2 weeks and then go on to 150 mg dose after that.  Patient seemed to be a little confused and Probation officer informed her that she can just follow the instructions written on the bottle for Effexor and repeated the instructions for her Lexapro tapering.  She verbalized her understanding.  Writer asked her if she thinks she needs an inpatient hospital stay at this point and she replied no because she needs to be there for her children although  they do not really care for her.  She denies any symptoms history of mania or hypomania.  She denied any hallucinations or delusions.  She once again asked the writer to allow her to take some time off from work.  Writer informed her that Probation officer will be able to fill out her forms if she can send them to the office by next week.  Writer also informed her that Probation officer is leaving [place] and that made the patient even more upset.  upset.  Writer asked her if she would like to return back to seeing [name] who she used to see in the past.  After some back-and-forth she said she is  agreeable to seeing [name] again.   Visit Diagnosis:    ICD-10-CM   1. Moderate episode of recurrent major depressive disorder (HCC)  F33.1 venlafaxine XR (EFFEXOR XR) 75 MG 24 hr capsule    2. Anxiety  F41.9 venlafaxine XR (EFFEXOR XR) 75 MG 24 hr capsule    ALPRAZolam (XANAX) 1 MG tablet    ALPRAZolam (XANAX) 0.25 MG tablet      Past Psychiatric History: Depression, anxiety  Past Medical History:  Past Medical History:  Diagnosis Date   Anemia    Anxiety    Phreesia 03/18/2021   Depression    Phreesia 03/18/2021   Generalized headaches    Hypertension     Past Surgical History:  Procedure Laterality Date   ABDOMINAL HYSTERECTOMY     fibroids, partial   BREAST REDUCTION SURGERY  2005   COLONOSCOPY WITH PROPOFOL N/A 05/10/2019   Procedure: COLONOSCOPY WITH PROPOFOL;  Surgeon: Daneil Dolin, MD;  [facility];  Service: Endoscopy;  Laterality: N/A;  2:45pm   GASTRIC BYPASS  2007   POLYPECTOMY  05/10/2019   Procedure: POLYPECTOMY;  Surgeon: Daneil Dolin, MD;  [facility];  Service: Endoscopy;;  colon    Family Psychiatric History: See Below  Family History:  Family History  Problem Relation Age of Onset   Hypertension Mother    Diabetes Mother    Drug abuse Mother    Hypertension Father    Hypertension Sister    Hypertension Sister    Colon cancer Neg Hx     Social History:  Social History   Socioeconomic History   Marital status: Single    Spouse name: Not on file   Number of children: Not on file   Years of education: Not on file   Highest education level: Not on file  Occupational History   Not on file  Tobacco Use   Smoking status: Never   Smokeless tobacco: Never  Substance and Sexual Activity   Alcohol use: No   Drug use: No   Sexual activity: Yes    Birth control/protection: Condom  Other Topics Concern   Not on file  Social History Narrative   Not on file   Social Determinants of Health   Financial  Resource Strain: Not on file  Food Insecurity: Not on file  Transportation Needs: Not on file  Physical Activity: Not on file  Stress: Not on file  Social Connections: Not on file    Allergies:  Allergies  Allergen Reactions   Ketorolac Tromethamine Hives    Lips swelled    Metabolic Disorder Labs: Lab Results  Component Value Date   HGBA1C 5.4 03/22/2021   No results found for: PROLACTIN Lab Results  Component Value Date   CHOL 164 03/22/2021   TRIG  73 03/22/2021   HDL 69 03/22/2021   CHOLHDL 2.4 03/22/2021   VLDL 10 01/02/2017   LDLCALC 81 03/22/2021   LDLCALC 124 (H) 09/06/2020   Lab Results  Component Value Date   TSH 0.409 (L) 03/22/2021   TSH 0.278 (L) 09/06/2020   TSH 0.279 (L) 09/06/2020    Therapeutic Level Labs: No results found for: LITHIUM No results found for: VALPROATE No components found for:  CBMZ  Current Medications: Current Outpatient Medications  Medication Sig Dispense Refill   venlafaxine XR (EFFEXOR XR) 75 MG 24 hr capsule Take 1 capsule in the morning for 2 weeks and then take 2 capsules in the morning 60 capsule 1   albuterol (VENTOLIN HFA) 108 (90 Base) MCG/ACT inhaler Inhale 2 puffs into the lungs every 6 (six) hours as needed for wheezing or shortness of breath. 18 g 0   ALPRAZolam (XANAX) 0.25 MG tablet Take 1 tablet (0.25 mg total) by mouth 2 (two) times daily as needed for anxiety. 60 tablet 1   ALPRAZolam (XANAX) 1 MG tablet Take 1 tablet (1 mg total) by mouth every evening. 30 tablet 1   amLODipine (NORVASC) 10 MG tablet Take 1 tablet (10 mg total) by mouth daily. 90 tablet 3   Cyanocobalamin (B-12) 2500 MCG TABS Take 1 tablet by mouth daily.     ergocalciferol (VITAMIN D2) 1.25 MG (50000 UT) capsule Take 1 capsule (50,000 Units total) by mouth once a week. One capsule once weekly 12 capsule 2   fluconazole (DIFLUCAN) 100 MG tablet Take 1 tablet (100 mg total) by mouth daily. 3 tablet 0   ondansetron (ZOFRAN) 4 MG tablet TAKE 1  TABLET BY MOUTH EVERY 8 HOURS AS NEEDED FOR NAUSEA OR VOMITING 20 tablet 0   predniSONE (DELTASONE) 10 MG tablet Take 1 tablet (10 mg total) by mouth 2 (two) times daily with a meal. 10 tablet 0   promethazine-dextromethorphan (PROMETHAZINE-DM) 6.25-15 MG/5ML syrup Take 5 mLs by mouth 4 (four) times daily as needed for cough. 118 mL 0   rizatriptan (MAXALT) 10 MG tablet Take 1 tablet (10 mg total) by mouth as needed for migraine. May repeat in 2 hours if needed 10 tablet 0   spironolactone (ALDACTONE) 50 MG tablet Take 1 tablet (50 mg total) by mouth daily. 30 tablet 3   topiramate (TOPAMAX) 50 MG tablet Take 1 tablet (50 mg total) by mouth 2 (two) times daily. 60 tablet 3   No current facility-administered medications for this visit.     Psychiatric Specialty Exam: Review of Systems  There were no vitals taken for this visit.There is no height or weight on file to calculate BMI.  General Appearance: Fairly groomed, visibly upset  Eye Contact: Fair  Speech:  Clear and Coherent and Normal Rate  Volume:  Normal  Mood: Anxious, depressed  Affect:  Congruent  Thought Process:  Goal Directed and Linear  Orientation:  Full (Time, Place, and Person)  Thought Content: Logical   Suicidal Thoughts:  No  Homicidal Thoughts:  No  Memory:  Recent;   Fair Remote;   Good  Judgement:  Fair  Insight:  Fair  Psychomotor Activity:  Normal  Concentration:  Concentration: Good and Attention Span: Fair  Recall:  Good  Fund of Knowledge: Good  Language: Good  Akathisia:  No  Handed:  Right  AIMS (if indicated): not indicated  Assets:  Communication Skills Desire for Improvement Financial Resources/Insurance Housing Transportation Vocational/Educational  ADL's:  Intact  Cognition: WNL  Sleep:  Fair   Screenings: GAD-7    Flowsheet Row Video Visit from 05/04/2021 in [facility] Office Visit from 03/17/2019 in [facility] Counselor from 10/09/2018 in  [facility] Phone Follow Up from 08/04/2018 in [facility] Phone Follow Up from 07/16/2018 in [facility]  Total GAD-7 Score 12 5 19 12 12       PHQ2-9    Flowsheet Row Video Visit from 05/04/2021 in [facility] Video Visit from 05/03/2021 in [facility] Office Visit from 03/22/2021 in [facility] Video Visit from 03/19/2021 in [facility] Video Visit from 11/20/2020 in [facility]  PHQ-2 Total Score 5 1 6  0 0  PHQ-9 Total Score 15 1 23  -- --      Flowsheet Row Video Visit from 05/04/2021 in [facility] Phone Follow Up from 07/16/2018 in [facility] No Risk No Risk        Assessment and Plan: Patient is not doing well.  She seems somewhat distraught today as she continues to be under constant stress due to issues with her children.  She is also been recently diagnosed with multinodular goiter and will need surgical treatment for that.  She was also upset as the Probation officer informed her that Probation officer is leaving.  She has finally agreed to try different medication other than Lexapro.  She was offered trial of Effexor with tapering off of Lexapro and then gradually titrating up the dose of venlafaxine XR in the next few weeks. Potential side effects of medication and risks vs benefits of treatment vs non-treatment were explained and discussed. All questions were answered. She was seen for therapy sessions by a counselor at family medicine clinic in [place] however patient has no recollection that she was seen by anyone for that.  1. Moderate episode of recurrent major depressive disorder (HCC)  -Taper off Lexapro, reduce the dose to half a tablet (10 mg) for 1 week and then discontinue -Start venlafaxine XR (EFFEXOR XR) 75 MG 24 hr capsule; Take 1 capsule in the morning for  2 weeks and then take 2 capsules in the morning  Dispense: 60 capsule; Refill: 1. Patient was instructed to start Effexor XR only after she has completed Lexapro taper.  2. Anxiety  - Start venlafaxine XR (EFFEXOR XR) 75 MG 24 hr capsule; Take 1 capsule in the morning for 2 weeks and then take 2 capsules in the morning  Dispense: 60 capsule; Refill: 1.  Start Effexor XR after she has completed Lexapro taper. - ALPRAZolam (XANAX) 1 MG tablet; Take 1 tablet (1 mg total) by mouth every evening.  Dispense: 30 tablet; Refill: 1 - ALPRAZolam (XANAX) 0.25 MG tablet; Take 1 tablet (0.25 mg total) by mouth 2 (two) times daily as needed for anxiety.  Dispense: 60 tablet; Refill: 1  Supportive psychotherapy was provided during the session. Patient stated that she will be sending paperwork to the writer's office over the next few days so that she can get medical leave for the next 2 weeks.  Follow-up with [name] in 6 weeks.   Patient was informed that writer is going to refer her for med management back to [name] and Probation officer also asked the staff and the other clinic to schedule an appointment for therapy services with the therapist Ms. Christina Hussami for her.    Maythe Deramo  [facility], MD 05/04/2021, 9:51 AM

## 2021-05-08 ENCOUNTER — Other Ambulatory Visit: Payer: Self-pay

## 2021-05-08 ENCOUNTER — Ambulatory Visit (INDEPENDENT_AMBULATORY_CARE_PROVIDER_SITE_OTHER): Payer: 59 | Admitting: Family Medicine

## 2021-05-08 ENCOUNTER — Telehealth: Payer: Self-pay

## 2021-05-08 ENCOUNTER — Encounter (HOSPITAL_COMMUNITY): Payer: Self-pay | Admitting: Hematology

## 2021-05-08 ENCOUNTER — Encounter: Payer: Self-pay | Admitting: Family Medicine

## 2021-05-08 VITALS — BP 150/97 | HR 90 | Temp 98.0°F | Resp 20 | Ht 64.0 in | Wt 249.0 lb

## 2021-05-08 DIAGNOSIS — Z23 Encounter for immunization: Secondary | ICD-10-CM | POA: Diagnosis not present

## 2021-05-08 DIAGNOSIS — F3341 Major depressive disorder, recurrent, in partial remission: Secondary | ICD-10-CM | POA: Diagnosis not present

## 2021-05-08 DIAGNOSIS — I10 Essential (primary) hypertension: Secondary | ICD-10-CM | POA: Diagnosis not present

## 2021-05-08 DIAGNOSIS — R059 Cough, unspecified: Secondary | ICD-10-CM

## 2021-05-08 MED ORDER — SPIRONOLACTONE 100 MG PO TABS: 100.0000 mg | ORAL_TABLET | Freq: Every day | ORAL | 3 refills | Status: DC

## 2021-05-08 NOTE — Patient Instructions (Addendum)
F/U in 8 weeks, re evaluate blood pressure, call if  you  need me before, shingerix #2  Increased dose of spironolactone to 100 mg daily, your blood pressure is still too high  Shingrix # 1 today  It is important that you exercise regularly at least 30 minutes 5 times a week. If you develop chest pain, have severe difficulty breathing, or feel very tired, stop exercising immediately and seek medical attention    We will look into therapy referral (speak with Manager )  It is important that you exercise regularly at least 30 minutes 5 times a week. If you develop chest pain, have severe difficulty breathing, or feel very tired, stop exercising immediately and seek medical attention   Think about what you will eat, plan ahead. Choose " clean, green, fresh or frozen" over canned, processed or packaged foods which are more sugary, salty and fatty. 70 to 75% of food eaten should be vegetables and fruit. Three meals at set times with snacks allowed between meals, but they must be fruit or vegetables. Aim to eat over a 12 hour period , example 7 am to 7 pm, and STOP after  your last meal of the day. Drink water,generally about 64 ounces per day, no other drink is as healthy. Fruit juice is best enjoyed in a healthy way, by EATING the fruit.

## 2021-05-08 NOTE — Telephone Encounter (Signed)
Pt is calling to see if the following is being called in promethazine-dextromethorphan (PROMETHAZINE-DM) 6.25-15 MG/5ML syrup

## 2021-05-09 ENCOUNTER — Telehealth: Payer: Self-pay | Admitting: Clinical

## 2021-05-09 MED ORDER — PROMETHAZINE-DM 6.25-15 MG/5ML PO SYRP: ORAL_SOLUTION | ORAL | 0 refills | Status: DC

## 2021-05-09 NOTE — Telephone Encounter (Signed)
This Behavioral Health Clinician received request from [place], [place]. [place] to follow up with patient since [facility] Clinician was previously involved.  Per chart review, Olivia Woodward was seeing [name], psychiatrist, and appears that psychiatric care will be transitioned to [name], psychiatrist since an appointment has been made with [name] for 06/15/21.  [facility] notes reported that Olivia Woodward was seeing a therapist outside [facility] system as well so this [facility] will confirm with Olivia Woodward if she is still involved with therapist.   TC to Olivia Woodward, [phone], she reported her cough is making her hoarse so we did not speak long.  This [facility] confirmed her appointment with [name], virtually next Tuesday.  And informed Olivia Woodward that her next appt for psychiatry is with [name] on 06/15/21.  Olivia Woodward reported she had seen [name] before she went out on leave and was seen by the therapist [name]) at that time.  But when [name] was out on leave, patient was being seen by [name] and patient reported she was not receiving therapy at that time.  This [facility] informed Olivia Woodward that she can discuss with [name], [facility] what her long term goals are for therapy and Ms. [name] can assist her with that.  Olivia Woodward acknowledged understanding.

## 2021-05-09 NOTE — Telephone Encounter (Signed)
Pt informed

## 2021-05-09 NOTE — Progress Notes (Signed)
Olivia Woodward     MRN: [id number]      DOB: Apr 12, 1966   HPI Ms. Theys is here for follow up and re-evaluation of chronic medical conditions, medication management and review of any available recent lab and radiology data.  Preventive health is updated, specifically  Cancer screening and Immunization.   Has had Gyne eval, no intervention needed. Has been taken out of work by Psychiatry , as she is unable to function with all of her current stress. The PT denies any adverse reactions to current medications since the last visit.   ROS Denies recent fever or chills. Denies sinus pressure, nasal congestion, ear pain or sore throat. Denies chest congestion, c/o dry cough, worse at night, improving but not cleared, no wheezing. Denies chest pains, palpitations and leg swelling Denies abdominal pain, nausea, vomiting,diarrhea or constipation.   Denies dysuria, frequency, hesitancy or incontinence. Denies joint pain, swelling and limitation in mobility. Denies headaches, seizures, numbness, or tingling. C/o uncontrolled  depression, anxiety or insomnia. Denies skin break down or rash.   PE  BP (!) 150/97 (BP Location: Right Arm, Patient Position: Sitting, Cuff Size: Large)   Pulse 90   Temp 98 F (36.7 C)   Resp 20   Ht 5\' 4"  (1.626 m)   Wt 249 lb (112.9 kg)   SpO2 96%   BMI 42.74 kg/m   Patient alert and oriented and in no cardiopulmonary distress.  HEENT: No facial asymmetry, EOMI,     Neck supple .  Chest: Clear to auscultation bilaterally.  CVS: S1, S2 no murmurs, no S3.Regular rate.  ABD: Soft non tender.   Ext: No edema  MS: Adequate ROM spine, shoulders, hips and knees.  Skin: Intact, no ulcerations or rash noted.  Psych: Good eye contact, Memory intact  anxious and depressed appearing.  CNS: CN 2-12 intact, power,  normal throughout.no focal deficits noted.   Assessment & Plan  Essential hypertension Uncontrolled, inc dose of spironolactone , and re  eval in 2 months DASH diet and commitment to daily physical activity for a minimum of 30 minutes discussed and encouraged, as a part of hypertension management. The importance of attaining a healthy weight is also discussed.  BP/Weight 05/08/2021 05/04/2021 03/22/2021 03/20/2021 03/19/2021 10/26/2020 22/63/3354  Systolic BP 562 563 893 734 287 - 681  Diastolic BP 97 90 98 84 80 - 88  Wt. (Lbs) 249 251.2 246 - 235 250.8 250  BMI 42.74 39.34 38.53 - 36.81 39.28 39.16  Some encounter information is confidential and restricted. Go to Review Flowsheets activity to see all data.       MDD (major depressive disorder), recurrent, in partial remission ([place]) Severe and uncontrolled, being treated by Psychiatry and out of work currently per Psych  Morbid obesity [facility])  Patient re-educated about  the importance of commitment to a  minimum of 150 minutes of exercise per week as able.  The importance of healthy food choices with portion control discussed, as well as eating regularly and within a 12 hour window most days. The need to choose "clean , green" food 50 to 75% of the time is discussed, as well as to make water the primary drink and set a goal of 64 ounces water daily.    Weight /BMI 05/08/2021 05/04/2021 03/22/2021  WEIGHT 249 lb 251 lb 3.2 oz 246 lb  HEIGHT 5\' 4"  5\' 7"  5\' 7"   BMI 42.74 kg/m2 39.34 kg/m2 38.53 kg/m2  Some encounter information is confidential and restricted.  Go to Review Flowsheets activity to see all data.      Cough persistent though improving post infectious cough, phenergan DM renewed per pt request

## 2021-05-11 ENCOUNTER — Telehealth (HOSPITAL_COMMUNITY): Payer: Self-pay | Admitting: *Deleted

## 2021-05-11 ENCOUNTER — Telehealth (HOSPITAL_COMMUNITY): Payer: Self-pay | Admitting: Psychiatry

## 2021-05-11 NOTE — Telephone Encounter (Signed)
Patient called front desk staff earlier requesting to speak to the writer. Writer called her back at 9:18 AM.  Patient stated that the disability office told her that writer had not completed her paperwork. Writer informed her that our office did not receive the required paperwork until 7:53 AM today on May 11, 2021.   Writer informed her that it has been less than 2 hours ago that we received it and Probation officer will get to it as soon as she can. Patient kept repeating that staff for the disability office told her that it was not completed. Lots and lots of reassurance needed to be provided.

## 2021-05-11 NOTE — Telephone Encounter (Signed)
Received forms from [employer] this am and Dr Toy Care completed them and they were faxed to [phone], scanned into our system.

## 2021-05-12 ENCOUNTER — Encounter: Payer: Self-pay | Admitting: Family Medicine

## 2021-05-12 NOTE — Assessment & Plan Note (Signed)
persistent though improving post infectious cough, phenergan DM renewed per pt request

## 2021-05-12 NOTE — Assessment & Plan Note (Signed)
Uncontrolled, inc dose of spironolactone , and re eval in 2 months DASH diet and commitment to daily physical activity for a minimum of 30 minutes discussed and encouraged, as a part of hypertension management. The importance of attaining a healthy weight is also discussed.  BP/Weight 05/08/2021 05/04/2021 03/22/2021 03/20/2021 03/19/2021 10/26/2020 64/40/3474  Systolic BP 259 563 875 643 329 - 518  Diastolic BP 97 90 98 84 80 - 88  Wt. (Lbs) 249 251.2 246 - 235 250.8 250  BMI 42.74 39.34 38.53 - 36.81 39.28 39.16  Some encounter information is confidential and restricted. Go to Review Flowsheets activity to see all data.

## 2021-05-12 NOTE — Assessment & Plan Note (Signed)
Severe and uncontrolled, being treated by Psychiatry and out of work currently per [employer]

## 2021-05-12 NOTE — Assessment & Plan Note (Signed)
  Patient re-educated about  the importance of commitment to a  minimum of 150 minutes of exercise per week as able.  The importance of healthy food choices with portion control discussed, as well as eating regularly and within a 12 hour window most days. The need to choose "clean , green" food 50 to 75% of the time is discussed, as well as to make water the primary drink and set a goal of 64 ounces water daily.    Weight /BMI 05/08/2021 05/04/2021 03/22/2021  WEIGHT 249 lb 251 lb 3.2 oz 246 lb  HEIGHT 5\' 4"  5\' 7"  5\' 7"   BMI 42.74 kg/m2 39.34 kg/m2 38.53 kg/m2  Some encounter information is confidential and restricted. Go to Review Flowsheets activity to see all data.

## 2021-05-15 ENCOUNTER — Other Ambulatory Visit: Payer: Self-pay

## 2021-05-15 ENCOUNTER — Encounter (HOSPITAL_COMMUNITY): Payer: PRIVATE HEALTH INSURANCE

## 2021-05-15 ENCOUNTER — Telehealth (INDEPENDENT_AMBULATORY_CARE_PROVIDER_SITE_OTHER): Payer: 59 | Admitting: Licensed Clinical Social Worker

## 2021-05-15 DIAGNOSIS — F3341 Major depressive disorder, recurrent, in partial remission: Secondary | ICD-10-CM

## 2021-05-16 ENCOUNTER — Ambulatory Visit: Payer: PRIVATE HEALTH INSURANCE | Admitting: Nurse Practitioner

## 2021-05-16 ENCOUNTER — Encounter (HOSPITAL_COMMUNITY): Payer: PRIVATE HEALTH INSURANCE

## 2021-05-16 ENCOUNTER — Telehealth (INDEPENDENT_AMBULATORY_CARE_PROVIDER_SITE_OTHER): Payer: 59 | Admitting: Licensed Clinical Social Worker

## 2021-05-16 DIAGNOSIS — F3341 Major depressive disorder, recurrent, in partial remission: Secondary | ICD-10-CM
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
## 2021-05-16 NOTE — BH Specialist Note (Signed)
[facility] Treatment Plan Team Note  MRN: [id number] NAME: Olivia Woodward  DATE: 05/16/21  Start time:  330p End time:  335p Total time:  5 min  Total number of [facility] [place] Treatment Team Plan encounters: 2/4  Treatment Team Attendees: [name], LCSW, [name], Psychiatrist  Diagnoses: No diagnosis found.  Goals, Interventions and Follow-up Plan Goals: Increase healthy adjustment to current life circumstances Increase adequate support systems for patient/family Interventions: Mindfulness or Relaxation Training Behavioral Activation Medication Management Recommendations: n/a; She is seen by [name] she will be transferred to Dr Modesta Messing Follow-up Plan: weekly [facility] sessions  History of the present illness Presenting Problem/Current Symptoms: continued symptoms of depression  Psychiatric History  Depression: Yes Anxiety: No Mania: No Psychosis: No PTSD symptoms: No  Past Psychiatric History/Hospitalization(s): Hospitalization for psychiatric illness: No Prior Suicide Attempts: No Prior Self-injurious behavior: No  Psychosocial stressors Flowsheet Row [facility] [place] [facility] Follow Up from 08/04/2018 in [facility]  Current Stressors Family death  [Medication is not helping her level of depression and anxiety. ]  Familial Stressors None  Sleep Decreased, Difficulty falling asleep, Difficulty staying asleep       Self-harm Behaviors Risk Assessment Flowsheet Row Virtual [facility] Phone Follow Up from 07/16/2018 in [facility]  Self-harm risk factors --  [None Reported]  Have you recently had any thoughts about harming yourself? No       Screenings PHQ-9 Assessments:  Depression screen [facility] 2/9 05/08/2021 05/04/2021 05/03/2021  Decreased Interest 3 2 0  Down, Depressed, Hopeless 3 3 1   PHQ - 2 Score 6 5 1   Altered sleeping 3 1 0  Tired, decreased energy 3 2 0  Change in appetite 3 1 0  Feeling bad or failure about yourself  3 2 0   Trouble concentrating 3 2 0  Moving slowly or fidgety/restless 0 2 0  Suicidal thoughts 1 0 0  PHQ-9 Score 22 15 1   Difficult doing work/chores Very difficult Very difficult -  Some recent data might be hidden   GAD-7 Assessments:  GAD 7 : Generalized Anxiety Score 05/04/2021 03/17/2019 10/09/2018 08/12/2018  Nervous, Anxious, on Edge 2 1 3 2   Control/stop worrying 2 1 3 2   Worry too much - different things 2 1 3 2   Trouble relaxing 2 1 3 1   Restless 1 0 3 2  Easily annoyed or irritable 2 0 3 1  Afraid - awful might happen 1 1 1 2   Total GAD 7 Score 12 5 19 12   Anxiety Difficulty Very difficult - Extremely difficult Somewhat difficult    Past Medical History Past Medical History:  Diagnosis Date   Anemia    Anxiety    Phreesia 03/18/2021   Depression    Phreesia 03/18/2021   Generalized headaches    Hypertension     Vital signs: There were no vitals filed for this visit.  Allergies:  Allergies as of 05/16/2021 - Review Complete 05/12/2021  Allergen Reaction Noted   Ketorolac tromethamine Hives 03/16/2009    Medication History Current medications:  Outpatient Encounter Medications as of 05/16/2021  Medication Sig   albuterol (VENTOLIN HFA) 108 (90 Base) MCG/ACT inhaler Inhale 2 puffs into the lungs every 6 (six) hours as needed for wheezing or shortness of breath.   ALPRAZolam (XANAX) 0.25 MG tablet Take 1 tablet (0.25 mg total) by mouth 2 (two) times daily as needed for anxiety.   ALPRAZolam (XANAX) 1 MG tablet Take 1 tablet (1 mg total)  by mouth every evening.   amLODipine (NORVASC) 10 MG tablet Take 1 tablet (10 mg total) by mouth daily.   Cyanocobalamin (B-12) 2500 MCG TABS Take 1 tablet by mouth daily.   ergocalciferol (VITAMIN D2) 1.25 MG (50000 UT) capsule Take 1 capsule (50,000 Units total) by mouth once a week. One capsule once weekly   fluconazole (DIFLUCAN) 100 MG tablet Take 1 tablet (100 mg total) by mouth daily.   ondansetron (ZOFRAN) 4 MG tablet TAKE 1  TABLET BY MOUTH EVERY 8 HOURS AS NEEDED FOR NAUSEA OR VOMITING   promethazine-dextromethorphan (PROMETHAZINE-DM) 6.25-15 MG/5ML syrup Take one teaspoon at bedtime , as needed, for excessive cough   rizatriptan (MAXALT) 10 MG tablet Take 1 tablet (10 mg total) by mouth as needed for migraine. May repeat in 2 hours if needed   spironolactone (ALDACTONE) 100 MG tablet Take 1 tablet (100 mg total) by mouth daily.   topiramate (TOPAMAX) 50 MG tablet Take 1 tablet (50 mg total) by mouth 2 (two) times daily.   venlafaxine XR (EFFEXOR XR) 75 MG 24 hr capsule Take 1 capsule in the morning for 2 weeks and then take 2 capsules in the morning   No facility-administered encounter medications on file as of 05/16/2021.     Scribe for Treatment Team: Lubertha South, LCSW

## 2021-05-18 ENCOUNTER — Telehealth: Payer: Self-pay

## 2021-05-18 ENCOUNTER — Encounter (HOSPITAL_COMMUNITY): Payer: Self-pay | Admitting: Hematology

## 2021-05-18 NOTE — Telephone Encounter (Signed)
Medication management - Spoke to patient as she reported she had already spoken with [name] today and had agreed to give current medications for depression longer to see if helpful.  Patient agreed to call back if they just were not helping or any issues, or worsening symptoms or side effects.

## 2021-05-18 NOTE — Telephone Encounter (Signed)
I called and spoke with pt and spoke with her regarding her concern about the medication. She stated that she has started taking Venlafaxine XR but is causing her to have dryness of mouth and nausea. Writer recommended that she takes the medication with breakfast and stays hydrated during the day. Generally the nausea goes away with time.  She then stated that " I am not ready to return back to work on July 5th". Writer had filled out her FMLA form last week giving her time off for 2 weeks as per her original request. Writer informed her that Probation officer recommends that she tries to return back to work next week and if she is unable to do her assigned job functions she can contact the [employer] office next week.  She verbalized her understanding and agreement with the plan.

## 2021-05-18 NOTE — Telephone Encounter (Signed)
Medication problem - Pt left a message that she is having problems with current medications for depression, stating "I'm not doing good" and thinks something needs to be changed and also that she is "not ready to return to work on the 5th". Patient requests for her excuse for work to be extended past July 5th and for someone to call her to discuss possible medication changes.  Patient last seen by [name] 05/04/21 and transitioning to [name] but not able to be seen by new provider until 06/15/21.  She would like to speak to a provider today about continued problems with mood and medications.

## 2021-05-22 ENCOUNTER — Encounter (HOSPITAL_COMMUNITY): Payer: Self-pay | Admitting: Hematology

## 2021-05-22 ENCOUNTER — Other Ambulatory Visit: Payer: Self-pay | Admitting: Psychiatry

## 2021-05-22 ENCOUNTER — Telehealth: Payer: Self-pay | Admitting: *Deleted

## 2021-05-22 MED ORDER — ESCITALOPRAM OXALATE 20 MG PO TABS: ORAL_TABLET | ORAL | 0 refills | Status: DC

## 2021-05-22 NOTE — Telephone Encounter (Signed)
Discussed with the patient.  She experiences significant nausea and dizziness since starting venlafaxine.  She is willing to go back to Lexapro at this time with a plan to do adjunctive treatment in the future.  She is also concerned about her job; she is currently on FMLA, which has been expired.  Discussed the following. She was informed that this clinician will not be able to fill out any paperwork until the next evaluation.  -Discontinue venlafaxine - Start Lexapro 10 mg daily for 1 week, then 20 mg -Next appointment: 7/11 at 8 AM, video

## 2021-05-22 NOTE — Telephone Encounter (Signed)
Patient called stating she is not able to take her Venlafaxine 75mg  1 caps. Per pt it is causing her to have really bad headaches, dry mouth. Per pt her Nausea is really really bad. Per pt she can not take this medication and would like for provider to please call her back. Pt number is [phone]

## 2021-05-22 NOTE — Telephone Encounter (Signed)
noted 

## 2021-05-23 ENCOUNTER — Other Ambulatory Visit: Payer: Self-pay

## 2021-05-23 ENCOUNTER — Ambulatory Visit (INDEPENDENT_AMBULATORY_CARE_PROVIDER_SITE_OTHER): Payer: PRIVATE HEALTH INSURANCE | Admitting: Licensed Clinical Social Worker

## 2021-05-23 ENCOUNTER — Telehealth: Payer: Self-pay | Admitting: Licensed Clinical Social Worker

## 2021-05-23 DIAGNOSIS — F3341 Major depressive disorder, recurrent, in partial remission: Secondary | ICD-10-CM

## 2021-05-23 NOTE — BH Specialist Note (Signed)
Virtual Behavioral Health Treatment Plan Team Note  MRN: [id number] NAME: Olivia Woodward  DATE: 05/23/21  Start time:   330End time:  340p Total time:  10 min  Total number of Virtual [place] Treatment Team Plan encounters: 3/4  Treatment Team Attendees: [name], LCSW; [name], Psychiatrist  Diagnoses: No diagnosis found.  Goals, Interventions and Follow-up Plan Goals: Increase healthy adjustment to current life circumstances Interventions: Motivational Interviewing CBT Cognitive Behavioral Therapy Medication Management Recommendations: n'a. She will be seen by [name] Follow-up Plan: Refer to [facility] Intensive Outpatient Program  History of the present illness Presenting Problem/Current Symptoms: continued symptoms of her diagnosis  Psychiatric History  Depression: Yes Anxiety: No Mania: No Psychosis: No PTSD symptoms: No  Past Psychiatric History/Hospitalization(s): Hospitalization for psychiatric illness: No Prior Suicide Attempts: No Prior Self-injurious behavior: No  Psychosocial stressors Flowsheet Row Virtual [place] Phone Follow Up from 08/04/2018 in [facility]  Current Stressors Family death  [Medication is not helping her level of depression and anxiety. ]  Familial Stressors None  Sleep Decreased, Difficulty falling asleep, Difficulty staying asleep       Self-harm Behaviors Risk Assessment Flowsheet Row Virtual BH Phone Follow Up from 07/16/2018 in [facility]  Self-harm risk factors --  [None Reported]  Have you recently had any thoughts about harming yourself? No       Screenings PHQ-9 Assessments:  Depression screen [facility] 2/9 05/08/2021 05/04/2021 05/03/2021  Decreased Interest 3 2 0  Down, Depressed, Hopeless 3 3 1   PHQ - 2 Score 6 5 1   Altered sleeping 3 1 0  Tired, decreased energy 3 2 0  Change in appetite 3 1 0  Feeling bad or failure about yourself  3 2 0  Trouble concentrating 3 2 0  Moving slowly or  fidgety/restless 0 2 0  Suicidal thoughts 1 0 0  PHQ-9 Score 22 15 1   Difficult doing work/chores Very difficult Very difficult -  Some recent data might be hidden   GAD-7 Assessments:  GAD 7 : Generalized Anxiety Score 05/04/2021 03/17/2019 10/09/2018 08/12/2018  Nervous, Anxious, on Edge 2 1 3 2   Control/stop worrying 2 1 3 2   Worry too much - different things 2 1 3 2   Trouble relaxing 2 1 3 1   Restless 1 0 3 2  Easily annoyed or irritable 2 0 3 1  Afraid - awful might happen 1 1 1 2   Total GAD 7 Score 12 5 19 12   Anxiety Difficulty Very difficult - Extremely difficult Somewhat difficult    Past Medical History Past Medical History:  Diagnosis Date   Anemia    Anxiety    Phreesia 03/18/2021   Depression    Phreesia 03/18/2021   Generalized headaches    Hypertension     Vital signs: There were no vitals filed for this visit.  Allergies:  Allergies as of 05/23/2021 - Review Complete 05/12/2021  Allergen Reaction Noted   Ketorolac tromethamine Hives 03/16/2009    Medication History Current medications:  Outpatient Encounter Medications as of 05/23/2021  Medication Sig   albuterol (VENTOLIN HFA) 108 (90 Base) MCG/ACT inhaler Inhale 2 puffs into the lungs every 6 (six) hours as needed for wheezing or shortness of breath.   ALPRAZolam (XANAX) 0.25 MG tablet Take 1 tablet (0.25 mg total) by mouth 2 (two) times daily as needed for anxiety.   ALPRAZolam (XANAX) 1 MG tablet Take 1 tablet (1 mg total) by mouth every evening.   amLODipine (NORVASC)  10 MG tablet Take 1 tablet (10 mg total) by mouth daily.   Cyanocobalamin (B-12) 2500 MCG TABS Take 1 tablet by mouth daily.   ergocalciferol (VITAMIN D2) 1.25 MG (50000 UT) capsule Take 1 capsule (50,000 Units total) by mouth once a week. One capsule once weekly   escitalopram (LEXAPRO) 20 MG tablet 10 mg daily for one week, then 20 mg daily   fluconazole (DIFLUCAN) 100 MG tablet Take 1 tablet (100 mg total) by mouth daily.    ondansetron (ZOFRAN) 4 MG tablet TAKE 1 TABLET BY MOUTH EVERY 8 HOURS AS NEEDED FOR NAUSEA OR VOMITING   promethazine-dextromethorphan (PROMETHAZINE-DM) 6.25-15 MG/5ML syrup Take one teaspoon at bedtime , as needed, for excessive cough   rizatriptan (MAXALT) 10 MG tablet Take 1 tablet (10 mg total) by mouth as needed for migraine. May repeat in 2 hours if needed   spironolactone (ALDACTONE) 100 MG tablet Take 1 tablet (100 mg total) by mouth daily.   topiramate (TOPAMAX) 50 MG tablet Take 1 tablet (50 mg total) by mouth 2 (two) times daily.   No facility-administered encounter medications on file as of 05/23/2021.     Scribe for Treatment Team: Lubertha South, LCSW

## 2021-05-23 NOTE — Progress Notes (Signed)
[place] Follow Up Assessment  MRN: [id number] NAME: Olivia Woodward Date: 05/23/21  Start time: 11a End time: 1125a Total time: 20  Type of Contact: Follow up Call  Current concerns/stressors: Patient reports continued pattern of low energy, lack of motivation, poor appetite and oversleeping   Functional Assessment:  Sleep: poor Appetite: poor Coping ability: overwhelmed Patient taking medications as prescribed:  yes  Current medications:  Outpatient Encounter Medications as of 05/23/2021  Medication Sig   albuterol (VENTOLIN HFA) 108 (90 Base) MCG/ACT inhaler Inhale 2 puffs into the lungs every 6 (six) hours as needed for wheezing or shortness of breath.   ALPRAZolam (XANAX) 0.25 MG tablet Take 1 tablet (0.25 mg total) by mouth 2 (two) times daily as needed for anxiety.   ALPRAZolam (XANAX) 1 MG tablet Take 1 tablet (1 mg total) by mouth every evening.   amLODipine (NORVASC) 10 MG tablet Take 1 tablet (10 mg total) by mouth daily.   Cyanocobalamin (B-12) 2500 MCG TABS Take 1 tablet by mouth daily.   ergocalciferol (VITAMIN D2) 1.25 MG (50000 UT) capsule Take 1 capsule (50,000 Units total) by mouth once a week. One capsule once weekly   escitalopram (LEXAPRO) 20 MG tablet 10 mg daily for one week, then 20 mg daily   fluconazole (DIFLUCAN) 100 MG tablet Take 1 tablet (100 mg total) by mouth daily.   ondansetron (ZOFRAN) 4 MG tablet TAKE 1 TABLET BY MOUTH EVERY 8 HOURS AS NEEDED FOR NAUSEA OR VOMITING   promethazine-dextromethorphan (PROMETHAZINE-DM) 6.25-15 MG/5ML syrup Take one teaspoon at bedtime , as needed, for excessive cough   rizatriptan (MAXALT) 10 MG tablet Take 1 tablet (10 mg total) by mouth as needed for migraine. May repeat in 2 hours if needed   spironolactone (ALDACTONE) 100 MG tablet Take 1 tablet (100 mg total) by mouth daily.   topiramate (TOPAMAX) 50 MG tablet Take 1 tablet (50 mg total) by mouth 2 (two) times daily.   No facility-administered  encounter medications on file as of 05/23/2021.    Self-harm and/or Suicidal Behaviors Risk Assessment Self-harm risk factors: no Patient endorses recent self injurious thoughts and/or behaviors: No   Suicide ideations: No plan to harm self or others   Danger to Others Risk Assessment Danger to others risk factors: no Patient endorses recent thoughts of harming others: No    Substance Use Assessment Patient recently consumed alcohol: No  Patient recently used drugs: No  Patient is concerned about dependence or abuse of substances: No    Goals, Interventions and Follow-up Plan Goals: Increase healthy adjustment to current life circumstances Interventions: Motivational Interviewing and CBT Cognitive Behavioral Therapy   Summary of Clinical Assessment  Olivia Woodward is a 55 yr old woman that was referred to [facility] by her PCP.  She did not want to video chat but was open to a telephone call.  She reports that she has not had any energy to attempt any of the task that I gave to her last week.  She continues to have the same symptoms (poor energy, lack of motivation, oversleeping, poor appetite). SHe continues to be on short term disability at her job and is unsure of her return to work date.  She stated that she spoke with her Psychiatrist, [name] yesterday and some changes were made to her medication.  Therapist provided her with a homework again.  Follow up scheduled for next Wednesday. Therapist did refer her to IOP.   Follow-up Plan: Refer to [facility] Intensive  Outpatient Program [place], [place]

## 2021-05-24 ENCOUNTER — Encounter (HOSPITAL_COMMUNITY): Admission: RE | Admit: 2021-05-24 | Payer: PRIVATE HEALTH INSURANCE | Source: Ambulatory Visit

## 2021-05-24 ENCOUNTER — Encounter (HOSPITAL_COMMUNITY): Payer: PRIVATE HEALTH INSURANCE
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
## 2021-05-25 ENCOUNTER — Encounter (HOSPITAL_COMMUNITY): Payer: PRIVATE HEALTH INSURANCE

## 2021-05-28 ENCOUNTER — Other Ambulatory Visit: Payer: Self-pay

## 2021-05-28 ENCOUNTER — Encounter: Payer: Self-pay | Admitting: Psychiatry

## 2021-05-28 ENCOUNTER — Telehealth (INDEPENDENT_AMBULATORY_CARE_PROVIDER_SITE_OTHER): Payer: Self-pay | Admitting: Psychiatry

## 2021-05-28 ENCOUNTER — Other Ambulatory Visit (HOSPITAL_COMMUNITY): Payer: Self-pay | Admitting: Psychiatry

## 2021-05-28 ENCOUNTER — Encounter (HOSPITAL_COMMUNITY): Payer: Self-pay | Admitting: Hematology

## 2021-05-28 DIAGNOSIS — F331 Major depressive disorder, recurrent, moderate: Secondary | ICD-10-CM

## 2021-05-28 DIAGNOSIS — F419 Anxiety disorder, unspecified: Secondary | ICD-10-CM

## 2021-05-29 ENCOUNTER — Ambulatory Visit: Payer: PRIVATE HEALTH INSURANCE | Admitting: Nurse Practitioner

## 2021-05-29 NOTE — Telephone Encounter (Signed)
Message received and acknowledged.

## 2021-05-29 NOTE — Telephone Encounter (Signed)
Provider acknowledges this message and thanks nurse for contacting pharmacy.

## 2021-05-30 ENCOUNTER — Encounter (HOSPITAL_COMMUNITY): Payer: Self-pay | Admitting: Hematology

## 2021-05-31 ENCOUNTER — Other Ambulatory Visit (HOSPITAL_COMMUNITY): Payer: Self-pay | Admitting: Psychiatry

## 2021-05-31 ENCOUNTER — Encounter (HOSPITAL_COMMUNITY): Payer: Self-pay | Admitting: Hematology

## 2021-05-31 DIAGNOSIS — F419 Anxiety disorder, unspecified: Secondary | ICD-10-CM

## 2021-05-31 NOTE — Telephone Encounter (Signed)
Thank you for this update 

## 2021-06-04 ENCOUNTER — Telehealth: Payer: Self-pay | Admitting: *Deleted

## 2021-06-04 ENCOUNTER — Other Ambulatory Visit: Payer: Self-pay

## 2021-06-04 ENCOUNTER — Encounter (HOSPITAL_COMMUNITY)
Admission: RE | Admit: 2021-06-04 | Discharge: 2021-06-04 | Disposition: A | Discharge status: Home / Self Care | Payer: 59 | Source: Ambulatory Visit | Attending: "Endocrinology | Admitting: "Endocrinology

## 2021-06-04 DIAGNOSIS — E0590 Thyrotoxicosis, unspecified without thyrotoxic crisis or storm: Secondary | ICD-10-CM | POA: Insufficient documentation

## 2021-06-04 DIAGNOSIS — E042 Nontoxic multinodular goiter: Secondary | ICD-10-CM | POA: Diagnosis present

## 2021-06-04 MED ORDER — SODIUM IODIDE I-123 7.4 MBQ CAPS
454.0000 | ORAL_CAPSULE | Freq: Once | ORAL | Status: AC
  Administered 2021-06-04: 454 via ORAL

## 2021-06-04 NOTE — Telephone Encounter (Signed)
Pt made a virtual visit 06-06-21

## 2021-06-04 NOTE — Telephone Encounter (Signed)
Called patient and left message for them to return call at the office   

## 2021-06-04 NOTE — Telephone Encounter (Signed)
Pt is calling needing something called in for bursitis. Did not have appointment available. Would like a call back from the nurse

## 2021-06-05 ENCOUNTER — Encounter (HOSPITAL_COMMUNITY)
Admission: RE | Admit: 2021-06-05 | Discharge: 2021-06-05 | Disposition: A | Discharge status: Home / Self Care | Payer: 59 | Source: Ambulatory Visit | Attending: "Endocrinology | Admitting: "Endocrinology

## 2021-06-06 ENCOUNTER — Other Ambulatory Visit: Payer: Self-pay

## 2021-06-06 ENCOUNTER — Telehealth (INDEPENDENT_AMBULATORY_CARE_PROVIDER_SITE_OTHER): Payer: Self-pay

## 2021-06-06 ENCOUNTER — Telehealth (INDEPENDENT_AMBULATORY_CARE_PROVIDER_SITE_OTHER): Payer: 59 | Admitting: Family Medicine

## 2021-06-06 ENCOUNTER — Telehealth (INDEPENDENT_AMBULATORY_CARE_PROVIDER_SITE_OTHER): Payer: 59 | Admitting: Licensed Clinical Social Worker

## 2021-06-06 ENCOUNTER — Encounter: Payer: Self-pay | Admitting: Family Medicine

## 2021-06-06 DIAGNOSIS — M542 Cervicalgia: Secondary | ICD-10-CM

## 2021-06-06 DIAGNOSIS — F3341 Major depressive disorder, recurrent, in partial remission: Secondary | ICD-10-CM | POA: Diagnosis not present

## 2021-06-06 MED ORDER — FAMOTIDINE 40 MG PO TABS: 40.0000 mg | ORAL_TABLET | Freq: Every day | ORAL | 0 refills | Status: DC

## 2021-06-06 MED ORDER — PREDNISONE 20 MG PO TABS: ORAL_TABLET | ORAL | 0 refills | Status: DC

## 2021-06-06 MED ORDER — CYCLOBENZAPRINE HCL 10 MG PO TABS: ORAL_TABLET | ORAL | 0 refills | Status: DC

## 2021-06-06 NOTE — Telephone Encounter (Signed)
Sent Chat msg to [employer]... [1:34 PM]  Rhae Hammock added [name] and [place], Velna Hatchet to the chat.  [1:34 PM] Selinda Flavin, Dr Moshe Cipro needs to know what the follow up is with Olivia Woodward DOB [id number] - mrn [phone] - Pt is in the office now and unsure if she has missed an appointment.  We can not see any follow up.  Please advise.  Patient is on site now.  Thanks so much for your help with this.

## 2021-06-06 NOTE — Patient Instructions (Signed)
F/U as before, call if you need me sooner  You are treated for acute neck and shoulder pain, prednisone, flexeril and pepcid are prescribed  We will send a message to therapist that you are interested In a visit today  Thanks for choosing [facility], we consider it a privelige to serve you.

## 2021-06-06 NOTE — Progress Notes (Signed)
Virtual Visit via Telephone Note  I connected with Olivia Woodward on 06/06/21 at  2:00 PM EDT by telephone and verified that I am speaking with the correct person using two identifiers.  Location: Patient: home Provider: office   I discussed the limitations, risks, security and privacy concerns of performing an evaluation and management service by telephone and the availability of in person appointments. I also discussed with the patient that there may be a patient responsible charge related to this service. The patient expressed understanding and agreed to proceed.   History of Present Illness: 5 day h/o neck and shoulder pain rated at an 8, preventing her from sleeping, no recent trauma, has ahd similar symptoms and was treated for bursitis   Observations/Objective: There were no vitals taken for this visit. Good communication with no confusion and intact memory. Alert and oriented x 3 No signs of respiratory distress during speech   Assessment and Plan:  Neck pain, bilateral Uncontrolled, prednisone,  Short course and muscle relaxer prescribed  MDD (major depressive disorder), recurrent, in partial remission ([place]) Uncontrolled, out on medical leave, requests appt with therapist today, message sent  Follow Up Instructions:    I discussed the assessment and treatment plan with the patient. The patient was provided an opportunity to ask questions and all were answered. The patient agreed with the plan and demonstrated an understanding of the instructions.   The patient was advised to call back or seek an in-person evaluation if the symptoms worsen or if the condition fails to improve as anticipated.  I provided 14 minutes of non-face-to-face time during this encounter.   Tula Nakayama, MD

## 2021-06-10 ENCOUNTER — Encounter: Payer: Self-pay | Admitting: Family Medicine

## 2021-06-10 NOTE — Assessment & Plan Note (Signed)
Uncontrolled, prednisone,  Short course and muscle relaxer prescribed

## 2021-06-10 NOTE — Assessment & Plan Note (Signed)
Uncontrolled, out on medical leave, requests appt with therapist today, message sent

## 2021-06-11 ENCOUNTER — Other Ambulatory Visit: Payer: Self-pay

## 2021-06-11 ENCOUNTER — Ambulatory Visit (INDEPENDENT_AMBULATORY_CARE_PROVIDER_SITE_OTHER): Payer: PRIVATE HEALTH INSURANCE | Admitting: Nurse Practitioner

## 2021-06-11 ENCOUNTER — Encounter: Payer: Self-pay | Admitting: Nurse Practitioner

## 2021-06-11 VITALS — BP 139/88 | HR 88 | Ht 64.0 in | Wt 254.0 lb

## 2021-06-11 DIAGNOSIS — E0590 Thyrotoxicosis, unspecified without thyrotoxic crisis or storm: Secondary | ICD-10-CM

## 2021-06-11 DIAGNOSIS — E0510 Thyrotoxicosis with toxic single thyroid nodule without thyrotoxic crisis or storm: Secondary | ICD-10-CM

## 2021-06-11 NOTE — Progress Notes (Signed)
Endocrinology Follow Up Note                                            06/11/2021, 10:08 AM   Subjective:    Patient ID: Olivia Woodward, female    DOB: 05/19/1966, PCP Fayrene Helper, MD   Past Medical History:  Diagnosis Date   Anemia    Anxiety    Phreesia 03/18/2021   Depression    Phreesia 03/18/2021   Generalized headaches    Hypertension    Past Surgical History:  Procedure Laterality Date   ABDOMINAL HYSTERECTOMY     fibroids, partial   BREAST REDUCTION SURGERY  2005   COLONOSCOPY WITH PROPOFOL N/A 05/10/2019   Procedure: COLONOSCOPY WITH PROPOFOL;  Surgeon: Daneil Dolin, MD;  Location: [facility];  Service: Endoscopy;  Laterality: N/A;  2:45pm   GASTRIC BYPASS  2007   POLYPECTOMY  05/10/2019   Procedure: POLYPECTOMY;  Surgeon: Daneil Dolin, MD;  Location: [facility];  Service: Endoscopy;;  colon   Social History   Socioeconomic History   Marital status: Single    Spouse name: Not on file   Number of children: Not on file   Years of education: Not on file   Highest education level: Not on file  Occupational History   Not on file  Tobacco Use   Smoking status: Never   Smokeless tobacco: Never  Substance and Sexual Activity   Alcohol use: No   Drug use: No   Sexual activity: Yes    Birth control/protection: Condom  Other Topics Concern   Not on file  Social History Narrative   Not on file   Social Determinants of Health   Financial Resource Strain: Not on file  Food Insecurity: Not on file  Transportation Needs: Not on file  Physical Activity: Not on file  Stress: Not on file  Social Connections: Not on file   Family History  Problem Relation Age of Onset   Hypertension Mother    Diabetes Mother    Drug abuse Mother    Hypertension Father    Hypertension Sister    Hypertension Sister    Colon cancer Neg Hx    Outpatient Encounter Medications as of 06/11/2021  Medication Sig   albuterol (VENTOLIN HFA) 108 (90  Base) MCG/ACT inhaler Inhale 2 puffs into the lungs every 6 (six) hours as needed for wheezing or shortness of breath.   ALPRAZolam (XANAX) 0.25 MG tablet Take 1 tablet (0.25 mg total) by mouth 2 (two) times daily as needed for anxiety.   ALPRAZolam (XANAX) 1 MG tablet Take 1 tablet (1 mg total) by mouth every evening.   amLODipine (NORVASC) 10 MG tablet Take 1 tablet (10 mg total) by mouth daily.   Cyanocobalamin (B-12) 2500 MCG TABS Take 1 tablet by mouth daily.   cyclobenzaprine (FLEXERIL) 10 MG tablet Take one tablet by mouth at bedtime , for neck spasm   ergocalciferol (VITAMIN D2) 1.25 MG (50000 UT) capsule Take 1 capsule (50,000 Units total) by mouth once a week. One capsule once weekly   escitalopram (LEXAPRO) 20 MG tablet 10 mg daily for one week, then 20 mg daily   famotidine (PEPCID) 40 MG tablet Take 1 tablet (40 mg total) by mouth daily.   ondansetron (ZOFRAN) 4 MG tablet TAKE 1 TABLET BY  MOUTH EVERY 8 HOURS AS NEEDED FOR NAUSEA OR VOMITING   predniSONE (DELTASONE) 20 MG tablet Take one tablet by mouth 3 times daily for 2 days , then one tablet twice daily for 2 days, then one tablet once daily for 3 days, then one tablet every other day for 6 days, then stop   promethazine-dextromethorphan (PROMETHAZINE-DM) 6.25-15 MG/5ML syrup Take one teaspoon at bedtime , as needed, for excessive cough   rizatriptan (MAXALT) 10 MG tablet Take 1 tablet (10 mg total) by mouth as needed for migraine. May repeat in 2 hours if needed   spironolactone (ALDACTONE) 100 MG tablet Take 1 tablet (100 mg total) by mouth daily.   topiramate (TOPAMAX) 50 MG tablet Take 1 tablet (50 mg total) by mouth 2 (two) times daily.   No facility-administered encounter medications on file as of 06/11/2021.   ALLERGIES: Allergies  Allergen Reactions   Ketorolac Tromethamine Hives    Lips swelled    VACCINATION STATUS: Immunization History  Administered Date(s) Administered   Influenza Whole 08/24/2009, 07/25/2010,  09/28/2016   Influenza, High Dose Seasonal PF 11/18/2020   Influenza,inj,Quad PF,6+ Mos 07/09/2018, 07/19/2019   Janssen (J&J) SARS-COV-2 Vaccination 01/30/2020, 11/18/2020   Td 07/25/2010   Zoster Recombinat (Shingrix) 05/08/2021    HPI  Olivia Woodward is 55 y.o. female who presents today with a medical history as above. she is being seen in follow up after being seen in consultation for multinodular goiter, suppressed TSH requested by Fayrene Helper, MD.  History is obtained directly from the patient, as well as chart review.  She is known to have minimally suppressed TSH for at least 3 years.  She was never offered treatment for treating hypothyroidism. She is also known to have multinodular goiter at least from 2021.  She underwent biopsy of 1 nodule on the right lobe with benign outcomes. She denies palpitations, tremors, nor heat intolerance. She denies any dysphagia, shortness of breath, nor voice change. Her last ultrasound was from October 2021 when she had 5.5 cm right lobe with 3 nodules measuring 1.4 cm, 1.7 cm, 2.0 cm.  Appears that the 2 cm nodule was biopsied with benign outcome.  Left lobe measured 5.6 cm with 2 nodules measuring 1.6 cm and 1.3 cm.    She reports progressive weight gain, gaining approximately 10 pounds since last year.  Review of Systems  Review of systems  Constitutional: + steadily increasing body weight,  current Body mass index is 43.6 kg/m. , no fatigue, no subjective hyperthermia, no subjective hypothermia Eyes: no blurry vision, no xerophthalmia ENT: no sore throat, no nodules palpated in throat, no dysphagia/odynophagia, no hoarseness Cardiovascular: no chest pain, no shortness of breath, no palpitations, no leg swelling Respiratory: no cough, no shortness of breath Gastrointestinal: no nausea/vomiting/diarrhea Musculoskeletal: no muscle/joint aches Skin: no rashes, no hyperemia Neurological: no tremors, no numbness, no tingling, no  dizziness Psychiatric: no depression, + anxiety  Objective:     BP 139/88   Pulse 88   Ht '5\' 4"'$  (1.626 m)   Wt 254 lb (115.2 kg)   BMI 43.60 kg/m   Wt Readings from Last 3 Encounters:  06/11/21 254 lb (115.2 kg)  05/08/21 249 lb (112.9 kg)  05/04/21 251 lb 3.2 oz (113.9 kg)     BP Readings from Last 3 Encounters:  06/11/21 139/88  05/08/21 (!) 150/97  05/04/21 129/90    Physical Exam- Limited  Constitutional:  Body mass index is 43.6 kg/m. , not in acute  distress, anxious state of mind Eyes:  EOMI, no exophthalmos Neck: Supple Cardiovascular: RRR, no murmurs, rubs, or gallops, no edema Respiratory: Adequate breathing efforts, no crackles, rales, rhonchi, or wheezing Musculoskeletal: no gross deformities, strength intact in all four extremities, no gross restriction of joint movements Skin:  no rashes, no hyperemia Neurological: no tremor with outstretched hands  CMP ( most recent) CMP     Component Value Date/Time   NA 138 03/22/2021 0930   K 4.7 03/22/2021 0930   CL 103 03/22/2021 0930   CO2 17 (L) 03/22/2021 0930   GLUCOSE 99 03/22/2021 0930   GLUCOSE 91 10/04/2020 1618   BUN 13 03/22/2021 0930   CREATININE 1.02 (H) 03/22/2021 0930   CREATININE 0.71 07/10/2018 1015   CALCIUM 9.4 03/22/2021 0930   PROT 7.6 03/22/2021 0930   ALBUMIN 3.9 03/22/2021 0930   AST 13 03/22/2021 0930   ALT 6 03/22/2021 0930   ALKPHOS 59 03/22/2021 0930   BILITOT 0.3 03/22/2021 0930   GFRNONAA >60 10/04/2020 1618   GFRNONAA 100 07/10/2018 1015   GFRAA 87 09/06/2020 0843   GFRAA 116 07/10/2018 1015     Diabetic Labs (most recent): Lab Results  Component Value Date   HGBA1C 5.4 03/22/2021   HGBA1C 5.3 07/26/2010     Lipid Panel ( most recent) Lipid Panel     Component Value Date/Time   CHOL 164 03/22/2021 0930   TRIG 73 03/22/2021 0930   HDL 69 03/22/2021 0930   CHOLHDL 2.4 03/22/2021 0930   CHOLHDL 2.0 12/27/2017 0949   VLDL 10 01/02/2017 1536   LDLCALC 81  03/22/2021 0930   LDLCALC 71 12/27/2017 0949   LABVLDL 14 03/22/2021 0930      Lab Results  Component Value Date   TSH 0.409 (L) 03/22/2021   TSH 0.278 (L) 09/06/2020   TSH 0.279 (L) 09/06/2020   TSH 0.375 11/19/2018   TSH 0.339 (L) 10/26/2018   TSH 0.73 12/27/2017   TSH 0.58 01/02/2017   TSH 0.912 12/28/2008   FREET4 1.12 09/06/2020   FREET4 CANCELED 10/26/2018   FREET4 1.10 07/26/2010    Uptake and scan 06/05/21 CLINICAL DATA:  Toxic multinodular goiter, decreased appetite, hand tremors, irritability, increased thirst, fatigue and weakness, weight gain, cold intolerance   EXAM: THYROID SCAN AND UPTAKE - 4 AND 24 HOURS   TECHNIQUE: Following oral administration of I-123 capsule, anterior planar imaging was acquired at 24 hours. Thyroid uptake was calculated with a thyroid probe at 4-6 hours and 24 hours.   RADIOPHARMACEUTICALS:  [id number] uCi I-123 sodium iodide p.o.   COMPARISON:  Thyroid ultrasound 09/14/2020, RIGHT thyroid FNA 10/19/2020   FINDINGS: Small warm nodule at superior pole LEFT thyroid lobe. Cold nodule at mid inferior RIGHT thyroid lobe, corresponding to nodule which underwent FNA in 2021. No additional areas of increased or decreased tracer uptake.   4 hour I-123 uptake = 5.6% (normal 5-20%)   24 hour I-123 uptake = 17.9% (normal 10-30%)   IMPRESSION: Normal 4 hour and 24 hour radio iodine uptakes.   Multinodular thyroid gland.     Electronically Signed   By: Lavonia Dana M.D.   On: 06/05/2021 14:55  Assessment & Plan:   1. Multinodular goiter She has history of benign FNA from one of the right sided nodules in 2021.  She did have cold nodule noted on uptake and scan that was previously biopsied and declared as benign, will not need further intervention at this time.  Will consider repeating thyroid  US for surveillance.  2. Subclinical hyperthyroidism r/t toxic nodule  - Olivia Woodward  is being seen at a kind request of Fayrene Helper, MD.  Her uptake and scan did find warm nodule in left superior thyroid gland which is evidence of toxic nodule contributing to her fluctuating thyroid levels and symptoms of hyperthyroidism.  -We discussed definitive therapy including RAI ablation being the therapy of choice due to the lack of neck compression symptoms.  She is initially hesitant to do this, wanting to talk about surgical removal of the thyroid instead for simplicity.   However, I did discuss that both treatments would require close monitoring of thyroid function tests and would require initiation of thyroid hormone replacement in the future.  She agreed to try the RAI ablation.  She will need repeat thyroid function tests about 6 weeks after the treatment to observe treatment results and to help judge when to start thyroid hormone replacement.  - she is advised to maintain close follow up with Fayrene Helper, MD for primary care needs.     I spent 43 minutes in the care of the patient today including review of labs from [place], Lipids, Thyroid Function, Hematology (current and previous including abstractions from other facilities); face-to-face time discussing  her blood glucose readings/logs, discussing hypoglycemia and hyperglycemia episodes and symptoms, medications doses, her options of short and long term treatment based on the latest standards of care / guidelines;  discussion about incorporating lifestyle medicine;  and documenting the encounter.    Please refer to Patient Instructions for Blood Glucose Monitoring and Insulin/Medications Dosing Guide"  in media tab for additional information. Please  also refer to " Patient Self Inventory" in the Media  tab for reviewed elements of pertinent patient history.  [place] Ouch Callow participated in the discussions, expressed understanding, and voiced agreement with the above plans.  All questions were answered to her satisfaction. she is encouraged to contact clinic should she  have any questions or concerns prior to her return visit.  Follow up plan: Return in about 8 weeks (around 08/06/2021) for Thyroid follow up, Previsit labs, RAI ablation.   Rayetta Pigg, [facility] [address] [place], [place] [address] Phone: [phone] Fax: [phone]     06/11/2021, 10:08 AM

## 2021-06-11 NOTE — Patient Instructions (Signed)
Radioiodine (I-131) Therapy for Hyperthyroidism, Care After This sheet gives you information about how to care for yourself after your procedure. Your health care provider may also give you more specific instructions. If you have problems or questions, contact your health careprovider. What can I expect after the procedure? After the procedure, it is common to have a sore throat or mild neck pain forseveral months. Follow these instructions at home: For the first 48 hours after the procedure:  Do not use public bathrooms. Flush twice after using the toilet. Use tissue to wipe up any urine on the toilet seat. Men should sit down to urinate to reduce the risk of splashing. Take a bath or shower every day. Rinse the sink and tub after each use. Do not make food for other people. Wash your sheets, towels, and clothes each day. Wash them separately from other people's items. Drink enough fluid to keep your urine pale yellow.  Pregnancy and breastfeeding Do not try to become pregnant for as long as you are told by your health care provider. This may be for up to 1 year after your procedure. Use a method of birth control (contraception) to prevent pregnancy. Talk to your health care provider about what form of contraception is right for you. Do not breastfeed for as long as you are told by your health care provider, if this applies. General instructions  Avoid close contact with other people. Avoiding contact with children and pregnant women is especially important. Do this for 1 week after your procedure. Try to keep a distance of about 6 feet (1.8 m) from others. Sleep alone. Do not have close intimate contact of any kind. This includes kissing, physical contact, and sexual intercourse. If you have children, arrange for child care. Do not ride in vehicles with other people. Do not stay in a hotel. Stay home from work or school as told by your health care provider. Take over-the-counter and  prescription medicines only as told by your health care provider. This includes any thyroid medicines. When your health care provider tells you it is safe to travel, carry a note from the health care provider to explain that you have had radioiodine therapy. This is needed because radioactivity may set off detectors in airports or other places. Do not share utensils--such as silverware, plates, or cups--for as long as told by your health care provider. Use disposable utensils, or clean your utensils separately from those of others. Wash your hands often. If soap and water are not available, use hand sanitizer. Keep all follow-up visits as told by your health care provider. This is important. Follow-up visits may be required every 1-2 months after treatment.  Contact a health care provider if you: Have pain that gets worse or does not get better with medicine. Have a dry mouth. Lose your sense of taste. Become pregnant within 1 year of your procedure, if this applies. Feel unusually tired (fatigued). Have very dry skin. Start to lose your hair. Have bowel movements that are less frequent or more difficult than usual (constipation). Have unexplained weight gain. Always feel cold. Summary After the procedure, it is common to have a sore throat or mild neck pain for several months. Do not try to become pregnant for as long as you are told by your health care provider. Avoid close contact with other people. Avoiding contact with children and pregnant women is especially important. Do this for 1 week after your procedure. Keep all follow-up visits as told by  your health care provider. This is important. This information is not intended to replace advice given to you by your health care provider. Make sure you discuss any questions you have with your healthcare provider. Document Revised: 12/17/2018 Document Reviewed: 12/17/2018 Elsevier Patient Education  2022 [employer].

## 2021-06-12 ENCOUNTER — Telehealth (INDEPENDENT_AMBULATORY_CARE_PROVIDER_SITE_OTHER): Payer: 59 | Admitting: Licensed Clinical Social Worker

## 2021-06-12 ENCOUNTER — Ambulatory Visit: Payer: PRIVATE HEALTH INSURANCE | Admitting: Nurse Practitioner

## 2021-06-12 DIAGNOSIS — F3341 Major depressive disorder, recurrent, in partial remission: Secondary | ICD-10-CM

## 2021-06-12 NOTE — Progress Notes (Signed)
Virtual Visit via Video Note  I connected with Olivia Woodward on 06/25/21 at  8:00 AM EDT by a video enabled telemedicine application and verified that I am speaking with the correct person using two identifiers.  Location: Patient: home Provider: office Persons participated in the visit- patient, provider    I discussed the limitations of evaluation and management by telemedicine and the availability of in person appointments. The patient expressed understanding and agreed to proceed.     I discussed the assessment and treatment plan with the patient. The patient was provided an opportunity to ask questions and all were answered. The patient agreed with the plan and demonstrated an understanding of the instructions.   The patient was advised to call back or seek an in-person evaluation if the symptoms worsen or if the condition fails to improve as anticipated.  I provided 53 minutes of non-face-to-face time during this encounter.   Norman Clay, MD    [facility] MD/PA/NP OP Progress Note  06/25/2021 8:38 AM Olivia Woodward  MRN:  [id number]  Chief Complaint:  Chief Complaint   Follow-up; Depression    HPI:  This is a follow-up appointment for depression and anxiety.  She states that she had a radiation therapy.  She needed to stay in the room for 4 days during that treatment.  She is hoping that her weakness and mood would improve after this treatment, although she wishes not to have another treatment on this.  She has been to church the other day.  She enjoys a time there.  Although she recalls that she would work on something with her therapist, she is unable to recall what was discussed.  She agrees to do journaling, and keep track of her daily routine to see if any areas for improvement.  She does not feel confident to return to work due to her memory loss.  She feels depressed.  She has initial insomnia.  She feels fatigued.  She has been gaining weight, although she is unsure of any  change in her diet.  She denies SI.  She feels anxious and tense.  She has occasional panic attacks.  She tries to limit using Xanax for anxiety.  She denies alcohol use or drug use.   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 (53 year old daughter, and 53 yo son, going to college in 2022) Education: graduated from high school, attended [employer] Readings from Last 3 Encounters:  06/19/21 253 lb (114.8 kg)  06/11/21 254 lb (115.2 kg)  05/08/21 249 lb (112.9 kg)     Visit Diagnosis:    ICD-10-CM   1. MDD (major depressive disorder), recurrent episode, moderate (HCC)  F33.1     2. Anxiety  F41.9 ALPRAZolam (XANAX) 0.25 MG tablet    3. Insomnia, unspecified type  G47.00 Ambulatory referral to Neurology      Past Psychiatric History: Please see initial evaluation for full details. I have reviewed the history. No updates at this time.     Past Medical History:  Past Medical History:  Diagnosis Date   Anemia    Anxiety    Phreesia 03/18/2021   Depression    Phreesia 03/18/2021   Generalized headaches    Hypertension     Past Surgical History:  Procedure Laterality Date   ABDOMINAL HYSTERECTOMY     fibroids, partial  BREAST REDUCTION SURGERY  2005   COLONOSCOPY WITH PROPOFOL N/A 05/10/2019   Procedure: COLONOSCOPY WITH PROPOFOL;  Surgeon: Daneil Dolin, MD;  [facility]: [facility];  Service: Endoscopy;  Laterality: N/A;  2:45pm   GASTRIC BYPASS  2007   POLYPECTOMY  05/10/2019   Procedure: POLYPECTOMY;  Surgeon: Daneil Dolin, MD;  Location: [facility];  Service: Endoscopy;;  colon    Family Psychiatric History: Please see initial evaluation for full details. I have reviewed the history. No updates at this time.     Family History:  Family History  Problem Relation Age of Onset   Hypertension  Mother    Diabetes Mother    Drug abuse Mother    Hypertension Father    Hypertension Sister    Hypertension Sister    Colon cancer Neg Hx     Social History:  Social History   Socioeconomic History   Marital status: Single    Spouse name: Not on file   Number of children: Not on file   Years of education: Not on file   Highest education level: Not on file  Occupational History   Not on file  Tobacco Use   Smoking status: Never   Smokeless tobacco: Never  Substance and Sexual Activity   Alcohol use: No   Drug use: No   Sexual activity: Yes    Birth control/protection: Condom  Other Topics Concern   Not on file  Social History Narrative   Not on file   Social Determinants of Health   Financial Resource Strain: Not on file  Food Insecurity: Not on file  Transportation Needs: Not on file  Physical Activity: Not on file  Stress: Not on file  Social Connections: Not on file    Allergies:  Allergies  Allergen Reactions   Ketorolac Tromethamine Hives    Lips swelled    Metabolic Disorder Labs: Lab Results  Component Value Date   HGBA1C 5.4 03/22/2021   No results found for: PROLACTIN Lab Results  Component Value Date   CHOL 164 03/22/2021   TRIG 73 03/22/2021   HDL 69 03/22/2021   CHOLHDL 2.4 03/22/2021   VLDL 10 01/02/2017   LDLCALC 81 03/22/2021   LDLCALC 124 (H) 09/06/2020   Lab Results  Component Value Date   TSH 0.409 (L) 03/22/2021   TSH 0.278 (L) 09/06/2020   TSH 0.279 (L) 09/06/2020    Therapeutic Level Labs: No results found for: LITHIUM No results found for: VALPROATE No components found for:  CBMZ  Current Medications: Current Outpatient Medications  Medication Sig Dispense Refill   buPROPion (WELLBUTRIN XL) 150 MG 24 hr tablet Take 1 tablet (150 mg total) by mouth daily. 30 tablet 0   eszopiclone (LUNESTA) 1 MG TABS tablet Take 1 tablet (1 mg total) by mouth at bedtime as needed for sleep. Take immediately before bedtime 30 tablet  0   albuterol (VENTOLIN HFA) 108 (90 Base) MCG/ACT inhaler Inhale 2 puffs into the lungs every 6 (six) hours as needed for wheezing or shortness of breath. 18 g 0   [START ON 07/02/2021] ALPRAZolam (XANAX) 0.25 MG tablet Take 1 tablet (0.25 mg total) by mouth 2 (two) times daily as needed for anxiety. 60 tablet 1   ALPRAZolam (XANAX) 1 MG tablet Take 1 tablet (1 mg total) by mouth every evening. 30 tablet 1   amLODipine (NORVASC) 10 MG tablet Take 1 tablet (10 mg total) by mouth daily. 90 tablet 3   Cyanocobalamin (  B-12) 2500 MCG TABS Take 1 tablet by mouth daily.     cyclobenzaprine (FLEXERIL) 10 MG tablet Take one tablet by mouth at bedtime , for neck spasm 30 tablet 0   ergocalciferol (VITAMIN D2) 1.25 MG (50000 UT) capsule Take 1 capsule (50,000 Units total) by mouth once a week. One capsule once weekly 12 capsule 2   escitalopram (LEXAPRO) 20 MG tablet 10 mg daily for one week, then 20 mg daily 30 tablet 0   famotidine (PEPCID) 40 MG tablet Take 1 tablet (40 mg total) by mouth daily. 30 tablet 0   ibuprofen (ADVIL) 800 MG tablet Take 1 tablet (800 mg total) by mouth every 8 (eight) hours as needed. 30 tablet 0   ondansetron (ZOFRAN) 4 MG tablet TAKE 1 TABLET BY MOUTH EVERY 8 HOURS AS NEEDED FOR NAUSEA OR VOMITING 20 tablet 0   predniSONE (DELTASONE) 20 MG tablet Take one tablet by mouth 3 times daily for 2 days , then one tablet twice daily for 2 days, then one tablet once daily for 3 days, then one tablet every other day for 6 days, then stop 16 tablet 0   promethazine-dextromethorphan (PROMETHAZINE-DM) 6.25-15 MG/5ML syrup Take one teaspoon at bedtime , as needed, for excessive cough 180 mL 0   rizatriptan (MAXALT) 10 MG tablet Take 1 tablet (10 mg total) by mouth as needed for migraine. May repeat in 2 hours if needed 10 tablet 0   spironolactone (ALDACTONE) 100 MG tablet Take 1 tablet (100 mg total) by mouth daily. 30 tablet 3   topiramate (TOPAMAX) 100 MG tablet Take 1 tablet (100 mg total)  by mouth 2 (two) times daily. 60 tablet 3   No current facility-administered medications for this visit.     Musculoskeletal: Strength & Muscle Tone:  N/A Gait & Station:  N/A Patient leans: N/A  Psychiatric Specialty Exam: Review of Systems  Psychiatric/Behavioral:  Positive for decreased concentration, dysphoric mood and sleep disturbance. Negative for agitation, behavioral problems, confusion, hallucinations, self-injury and suicidal ideas. The patient is nervous/anxious. The patient is not hyperactive.   All other systems reviewed and are negative.  There were no vitals taken for this visit.There is no height or weight on file to calculate BMI.  General Appearance: Fairly Groomed  Eye Contact:  Good  Speech:  Clear and Coherent  Volume:  Normal  Mood:  Anxious and Depressed  Affect:  Appropriate, Congruent, Restricted, and down  Thought Process:  Coherent and Goal Directed  Orientation:  Full (Time, Place, and Person)  Thought Content: Logical   Suicidal Thoughts:  No  Homicidal Thoughts:  No  Memory:  Immediate;   Fair  Judgement:  Good  Insight:  Good  Psychomotor Activity:  Normal  Concentration:  Concentration: Fair and Attention Span: Fair  Recall:  Poor  Fund of Knowledge: Good  Language: Good  Akathisia:  No  Handed:  Right  AIMS (if indicated): not done  Assets:  Communication Skills Desire for Improvement  ADL's:  Intact  Cognition: WNL  Sleep:  Poor   Screenings: GAD-7    Flowsheet Row Video Visit from 06/12/2021 in [facility] Video Visit from 05/04/2021 in [facility] Office Visit from 03/17/2019 in [facility] Counselor from 10/09/2018 in [facility] [facility] Follow Up from 08/04/2018 in [facility]  Total GAD-7 Score '13 12 5 19 12      '$ [employer]    [place] Office Visit  from 06/19/2021 in [facility] Video Visit from  06/12/2021 in [facility] Office Visit from 05/08/2021 in [facility] Video Visit from 05/04/2021 in [facility] Video Visit from 05/03/2021 in [facility]  PHQ-2 Total Score '6 6 6 5 1  '$ PHQ-9 Total Score '18 15 22 15 1      '$ [place] Office Visit from 06/19/2021 in [facility] Video Visit from 05/28/2021 in [place] Office Visit from 05/08/2021 in [facility] Error: Q3, 4, or 5 should not be populated when Q2 is No Error: Q3, 4, or 5 should not be populated when Q2 is No Error: Q3, 4, or 5 should not be populated when Q2 is No        Assessment and Plan: Olivia Woodward is a 56 y.o. year old female with a history of depression, iron deficiency anemia,  multinodular goiter, subclinical hyperthyroidism, vitamin D deficiency, hypertension, s/p gastric bypass surgery in 2017, who presents for follow up appointment for below.    1. MDD (major depressive disorder), recurrent episode, moderate ([place]) 2. Anxiety Exam is notable for continued impairment in recent recall, and she continues to report depressive symptoms with anxiety since the last visit. Psychosocial stressors includes her daughter, who was molested by a nephew few years ago, and conflict with her family and this issues.  Will start bupropion as adjunctive treatment for depression.  Discussed potential risk of headache.  She denies any history of seizure.  Will continue Lexapro to target depression and anxiety.  Will continue Xanax as needed for anxiety.  She will continue to see a therapist through [facility].   # Insomnia She reports initial insomnia.  She was recently observed to have snoring by others.  She has fatigue.  Will make referral for evaluation of sleep apnea.  We will start Lunesta as needed for insomnia.  Discussed potential risk of drowsiness.     Plan Continue lexapro 20 mg daily Start  bupropion 150 mg daily  Start Lunesta 1 mg at night as needed for sleep Continue Xanax 0.25 mg in AM, 1 mg at night as needed for anxiety - one refill left Referral for evaluation of sleep apnea Next appointment: 9/6 at 10:30 for 30 mins, video   I would support that she would remain out from work for 3 months, with expected return date on October 10 th.  She has depressive symptoms with significant impairment in cognition/memory and fatigue, which interferes with her ability to work at her capacity.      Past trials of medication: sertraline fluoxetine, lexapro, venlafaxine ("funny"), quetiapine (drowsiness), Xanax, temazepam, Trazodone, Ambien, Belsomra (could not afford)   I have reviewed suicide assessment in detail. No change in the following assessment.    The patient demonstrates the following risk factors for suicide: Chronic risk factors for suicide include: psychiatric disorder of depression. Acute risk factors for suicide include: loss (financial, interpersonal, professional). Protective factors for this patient include: responsibility to others (children, family), coping skills, hope for the future and religious beliefs against suicide. Considering these factors, the overall suicide risk at this point appears to be low. Patient is appropriate for outpatient follow up.  Norman Clay, MD 06/25/2021, 8:38 AM

## 2021-06-13 ENCOUNTER — Telehealth (INDEPENDENT_AMBULATORY_CARE_PROVIDER_SITE_OTHER): Payer: 59 | Admitting: Licensed Clinical Social Worker

## 2021-06-13 DIAGNOSIS — F3341 Major depressive disorder, recurrent, in partial remission: Secondary | ICD-10-CM

## 2021-06-13 NOTE — Progress Notes (Signed)
Called her 2 times; no response

## 2021-06-13 NOTE — Progress Notes (Signed)
[facility] Follow Up Assessment  MRN: [id number] NAME: Olivia Woodward Date: 06/13/21  Start time: 1130a End time: 12p Total time: 30  Type of Contact: Follow up Call  Current concerns/stressors: depressive symptoms  Screens/Assessment Tools:  PHQ-9 & GAD-7 Assessments: This is an evidence based assessment tool for depression and anxiety symptoms in adolescents and adults.  Score cut-off points for each section are as follows: 5-9: Mild, 10-14: Moderate, 15+: Severe  PHQ-9 for Depression = 15   GAD-7 for Anxiety = 13   How difficult have these problems made it for you to do your work, take care of things at home, or get along with other people? difficult  Functional Assessment:  Sleep: poor Appetite: fair Coping ability: overwhelmed Patient taking medications as prescribed:  yes  Current medications:  Outpatient Encounter Medications as of 06/12/2021  Medication Sig   albuterol (VENTOLIN HFA) 108 (90 Base) MCG/ACT inhaler Inhale 2 puffs into the lungs every 6 (six) hours as needed for wheezing or shortness of breath.   ALPRAZolam (XANAX) 0.25 MG tablet Take 1 tablet (0.25 mg total) by mouth 2 (two) times daily as needed for anxiety.   ALPRAZolam (XANAX) 1 MG tablet Take 1 tablet (1 mg total) by mouth every evening.   amLODipine (NORVASC) 10 MG tablet Take 1 tablet (10 mg total) by mouth daily.   Cyanocobalamin (B-12) 2500 MCG TABS Take 1 tablet by mouth daily.   cyclobenzaprine (FLEXERIL) 10 MG tablet Take one tablet by mouth at bedtime , for neck spasm   ergocalciferol (VITAMIN D2) 1.25 MG (50000 UT) capsule Take 1 capsule (50,000 Units total) by mouth once a week. One capsule once weekly   escitalopram (LEXAPRO) 20 MG tablet 10 mg daily for one week, then 20 mg daily   famotidine (PEPCID) 40 MG tablet Take 1 tablet (40 mg total) by mouth daily.   ondansetron (ZOFRAN) 4 MG tablet TAKE 1 TABLET BY MOUTH EVERY 8 HOURS AS NEEDED FOR NAUSEA OR VOMITING   predniSONE  (DELTASONE) 20 MG tablet Take one tablet by mouth 3 times daily for 2 days , then one tablet twice daily for 2 days, then one tablet once daily for 3 days, then one tablet every other day for 6 days, then stop   promethazine-dextromethorphan (PROMETHAZINE-DM) 6.25-15 MG/5ML syrup Take one teaspoon at bedtime , as needed, for excessive cough   rizatriptan (MAXALT) 10 MG tablet Take 1 tablet (10 mg total) by mouth as needed for migraine. May repeat in 2 hours if needed   spironolactone (ALDACTONE) 100 MG tablet Take 1 tablet (100 mg total) by mouth daily.   topiramate (TOPAMAX) 50 MG tablet Take 1 tablet (50 mg total) by mouth 2 (two) times daily.   No facility-administered encounter medications on file as of 06/12/2021.    Self-harm and/or Suicidal Behaviors Risk Assessment Self-harm risk factors: no Patient endorses recent self injurious thoughts and/or behaviors: No   Suicide ideations: No plan to harm self or others   Danger to Others Risk Assessment Danger to others risk factors: no Patient endorses recent thoughts of harming others: No    Substance Use Assessment Patient recently consumed alcohol: No  Patient recently used drugs: No  Patient is concerned about dependence or abuse of substances: No    Goals, Interventions and Follow-up Plan Goals: Increase healthy adjustment to current life circumstances and Increase adequate support systems for patient/family Interventions: Motivational Interviewing, Veterinary surgeon, and CBT Cognitive Behavioral Therapy   Summary of  Clinical Assessment  Olivia Woodward is a 54 yr old woman that is a follow up for depression.  She reports continued sadness, poor motivation, lack of energy and cycling sleep patterns.  She reports that she has attempted to complete homework assignment from previous session to "do something different such as: a different exercise, watch a new tv show or take a  different route to the store." She reports that she has  attempted this at least 3 times in the past 2 weeks.  She stated that she has had a thyroid test recently and is waiting on treatment procedures to correct the problem with her thyroid.  Provided her with psychoeducation on depression and how physical health is possibly connected. Provided her with homework (having a daily schedule and journaling).   Follow-up Plan:  Weekly VBH session Lubertha South, LCSW

## 2021-06-13 NOTE — BH Specialist Note (Signed)
Virtual Behavioral Health Treatment Plan Team Note  MRN: [id number] NAME: Olivia Woodward  DATE: 06/13/21  Virtual [facility] Treatment Team Plan encounters: 3/4  Treatment Team Attendees: [name], LCSW & Dr Modesta Messing, Psychiatrist  Diagnoses: No diagnosis found.  Goals, Interventions and Follow-up Plan Goals: Increase healthy adjustment to current life circumstances Increase adequate support systems for patient/family Interventions: Motivational Interviewing Behavioral Activation CBT Cognitive Behavioral Therapy Medication Management Recommendations: no change in the current medication Follow-up Plan: Weekly VBH session  History of the present illness Presenting Problem/Current Symptoms: continued symptoms of her DX  Psychiatric History  Depression: Yes Anxiety: No Mania: No Psychosis: No PTSD symptoms: No  Past Psychiatric History/Hospitalization(s): Hospitalization for psychiatric illness: No Prior Suicide Attempts: No Prior Self-injurious behavior: No  Psychosocial stressors Flowsheet Row Virtual [facility] Follow Up from 08/04/2018 in [facility]  Current Stressors Family death  [Medication is not helping her level of depression and anxiety. ]  Familial Stressors None  Sleep Decreased, Difficulty falling asleep, Difficulty staying asleep       Self-harm Behaviors Risk Assessment Flowsheet Row Virtual BH Phone Follow Up from 07/16/2018 in [facility]  Self-harm risk factors --  [None Reported]  Have you recently had any thoughts about harming yourself? No       Screenings PHQ-9 Assessments:  Depression screen [facility] 2/9 06/13/2021 05/08/2021 05/04/2021  Decreased Interest '3 3 2  '$ Down, Depressed, Hopeless '3 3 3  '$ PHQ - 2 Score '6 6 5  '$ Altered sleeping '2 3 1  '$ Tired, decreased energy '2 3 2  '$ Change in appetite '2 3 1  '$ Feeling bad or failure about yourself  '1 3 2  '$ Trouble concentrating '1 3 2  '$ Moving slowly or fidgety/restless 1 0 2  Suicidal  thoughts 0 1 0  PHQ-9 Score '15 22 15  '$ Difficult doing work/chores Very difficult Very difficult Very difficult  Some recent data might be hidden   GAD-7 Assessments:  GAD 7 : Generalized Anxiety Score 06/13/2021 05/04/2021 03/17/2019 10/09/2018  Nervous, Anxious, on Edge '2 2 1 3  '$ Control/stop worrying '2 2 1 3  '$ Worry too much - different things '2 2 1 3  '$ Trouble relaxing '2 2 1 3  '$ Restless 2 1 0 3  Easily annoyed or irritable 2 2 0 3  Afraid - awful might happen '1 1 1 1  '$ Total GAD 7 Score '13 12 5 19  '$ Anxiety Difficulty Very difficult Very difficult - Extremely difficult    Past Medical History Past Medical History:  Diagnosis Date   Anemia    Anxiety    Phreesia 03/18/2021   Depression    Phreesia 03/18/2021   Generalized headaches    Hypertension     Vital signs: There were no vitals filed for this visit.  Allergies:  Allergies as of 06/13/2021 - Review Complete 06/11/2021  Allergen Reaction Noted   Ketorolac tromethamine Hives 03/16/2009    Medication History Current medications:  Outpatient Encounter Medications as of 06/13/2021  Medication Sig   albuterol (VENTOLIN HFA) 108 (90 Base) MCG/ACT inhaler Inhale 2 puffs into the lungs every 6 (six) hours as needed for wheezing or shortness of breath.   ALPRAZolam (XANAX) 0.25 MG tablet Take 1 tablet (0.25 mg total) by mouth 2 (two) times daily as needed for anxiety.   ALPRAZolam (XANAX) 1 MG tablet Take 1 tablet (1 mg total) by mouth every evening.   amLODipine (NORVASC) 10 MG tablet Take 1 tablet (10 mg total) by mouth daily.  Cyanocobalamin (B-12) 2500 MCG TABS Take 1 tablet by mouth daily.   cyclobenzaprine (FLEXERIL) 10 MG tablet Take one tablet by mouth at bedtime , for neck spasm   ergocalciferol (VITAMIN D2) 1.25 MG (50000 UT) capsule Take 1 capsule (50,000 Units total) by mouth once a week. One capsule once weekly   escitalopram (LEXAPRO) 20 MG tablet 10 mg daily for one week, then 20 mg daily   famotidine (PEPCID)  40 MG tablet Take 1 tablet (40 mg total) by mouth daily.   ondansetron (ZOFRAN) 4 MG tablet TAKE 1 TABLET BY MOUTH EVERY 8 HOURS AS NEEDED FOR NAUSEA OR VOMITING   predniSONE (DELTASONE) 20 MG tablet Take one tablet by mouth 3 times daily for 2 days , then one tablet twice daily for 2 days, then one tablet once daily for 3 days, then one tablet every other day for 6 days, then stop   promethazine-dextromethorphan (PROMETHAZINE-DM) 6.25-15 MG/5ML syrup Take one teaspoon at bedtime , as needed, for excessive cough   rizatriptan (MAXALT) 10 MG tablet Take 1 tablet (10 mg total) by mouth as needed for migraine. May repeat in 2 hours if needed   spironolactone (ALDACTONE) 100 MG tablet Take 1 tablet (100 mg total) by mouth daily.   topiramate (TOPAMAX) 50 MG tablet Take 1 tablet (50 mg total) by mouth 2 (two) times daily.   No facility-administered encounter medications on file as of 06/13/2021.     Scribe for Treatment Team: Lubertha South, LCSW

## 2021-06-14 ENCOUNTER — Telehealth: Payer: Self-pay | Admitting: "Endocrinology

## 2021-06-14 NOTE — Telephone Encounter (Signed)
Pt has new insurance card and will e-mail a copy to [place] to check for authorization.

## 2021-06-14 NOTE — Telephone Encounter (Signed)
Left a message requesting a return call to the office. 

## 2021-06-14 NOTE — Telephone Encounter (Signed)
Pt is calling and states she is supposed to have RAI and has not heard from anyone. She is calling to follow up

## 2021-06-15 ENCOUNTER — Telehealth: Payer: Self-pay | Admitting: Psychiatry

## 2021-06-15 ENCOUNTER — Encounter (HOSPITAL_COMMUNITY): Payer: Self-pay | Admitting: Hematology

## 2021-06-19 ENCOUNTER — Encounter: Payer: Self-pay | Admitting: Family Medicine

## 2021-06-19 ENCOUNTER — Encounter (HOSPITAL_COMMUNITY): Payer: Self-pay

## 2021-06-19 ENCOUNTER — Encounter (HOSPITAL_COMMUNITY)
Admission: RE | Admit: 2021-06-19 | Discharge: 2021-06-19 | Disposition: A | Discharge status: Home / Self Care | Payer: 59 | Source: Ambulatory Visit | Attending: Nurse Practitioner | Admitting: Nurse Practitioner

## 2021-06-19 ENCOUNTER — Ambulatory Visit (INDEPENDENT_AMBULATORY_CARE_PROVIDER_SITE_OTHER): Payer: 59 | Admitting: Licensed Clinical Social Worker

## 2021-06-19 ENCOUNTER — Other Ambulatory Visit: Payer: Self-pay

## 2021-06-19 ENCOUNTER — Ambulatory Visit (INDEPENDENT_AMBULATORY_CARE_PROVIDER_SITE_OTHER): Payer: 59 | Admitting: Family Medicine

## 2021-06-19 VITALS — BP 126/84 | HR 98 | Temp 98.9°F | Resp 20 | Ht 64.0 in | Wt 253.0 lb

## 2021-06-19 DIAGNOSIS — F331 Major depressive disorder, recurrent, moderate: Secondary | ICD-10-CM | POA: Diagnosis not present

## 2021-06-19 DIAGNOSIS — E0510 Thyrotoxicosis with toxic single thyroid nodule without thyrotoxic crisis or storm: Secondary | ICD-10-CM | POA: Diagnosis present

## 2021-06-19 DIAGNOSIS — G43111 Migraine with aura, intractable, with status migrainosus: Secondary | ICD-10-CM | POA: Diagnosis not present

## 2021-06-19 DIAGNOSIS — G4489 Other headache syndrome: Secondary | ICD-10-CM

## 2021-06-19 DIAGNOSIS — E0590 Thyrotoxicosis, unspecified without thyrotoxic crisis or storm: Secondary | ICD-10-CM

## 2021-06-19 MED ORDER — IBUPROFEN 800 MG PO TABS: 800.0000 mg | ORAL_TABLET | Freq: Three times a day (TID) | ORAL | 0 refills | Status: DC | PRN

## 2021-06-19 MED ORDER — TOPIRAMATE 100 MG PO TABS
100.0000 mg | ORAL_TABLET | Freq: Two times a day (BID) | ORAL | 3 refills | Status: DC
Start: 2021-06-19 — End: 2022-02-07

## 2021-06-19 MED ORDER — SODIUM IODIDE I 131 CAPSULE
40.0000 | Freq: Once | INTRAVENOUS | Status: AC | PRN
  Administered 2021-06-19: 41.5 via ORAL

## 2021-06-19 NOTE — Patient Instructions (Signed)
Follow-up in office with MD in 3 months call if you need me sooner.  New higher dose of Topamax is prescribed 100 mg twice daily for prevention of migraine headaches.  Use Maxalt as soon as you feel a migraine headache starting and you may take it with ibuprofen 800 mg 1 tablet.  The Maxalt may be repeated 1 time only in a 24-hour period 2 hours after your initial dose.  You are referred to neurology for further management of your migraine headaches.  All the best with your treatment for overactive thyroid gland, you will feel better once this is adequately controlled.    It is important that you exercise regularly at least 30 minutes 5 times a week. If you develop chest pain, have severe difficulty breathing, or feel very tired, stop exercising immediately and seek medical attention  Think about what you will eat, plan ahead. Choose " clean, green, fresh or frozen" over canned, processed or packaged foods which are more sugary, salty and fatty. 70 to 75% of food eaten should be vegetables and fruit. Three meals at set times with snacks allowed between meals, but they must be fruit or vegetables. Aim to eat over a 12 hour period , example 7 am to 7 pm, and STOP after  your last meal of the day. Drink water,generally about 64 ounces per day, no other drink is as healthy. Fruit juice is best enjoyed in a healthy way, by EATING the fruit. Thanks for choosing [facility], we consider it a privelige to serve you.

## 2021-06-19 NOTE — Progress Notes (Signed)
   Olivia Woodward     MRN: [id number]      DOB: 1966/03/11   HPI Olivia Woodward is here for follow up and re-evaluation of chronic medical conditions, medication management and review of any available recent lab and radiology data.  Recently dx with overactive thyroid and will have radioactive iodine treatment today Depression still severe and prohibiting her from being able to work Reports severe insomnia Preventive health is updated, specifically  Cancer screening and Immunization.    The PT denies any adverse reactions to current medications since the last visit.     ROS Denies recent fever or chills. Denies sinus pressure, nasal congestion, ear pain or sore throat. Denies chest congestion, productive cough or wheezing. Denies chest pains, palpitations and leg swelling Denies abdominal pain, nausea, vomiting,diarrhea or constipation.   Denies dysuria, frequency, hesitancy or incontinence. Denies joint pain, swelling and limitation in mobility. . Denies skin break down or rash.   PE  BP 126/84 (BP Location: Right Arm, Patient Position: Sitting, Cuff Size: Large)   Pulse 98   Temp 98.9 F (37.2 C)   Resp 20   Ht '5\' 4"'$  (1.626 m)   Wt 253 lb (114.8 kg)   SpO2 98%   BMI 43.43 kg/m   Patient alert and oriented and in no cardiopulmonary distress.  HEENT: No facial asymmetry, EOMI,     Neck supple .  Chest: Clear to auscultation bilaterally.  CVS: S1, S2 no murmurs, no S3.Regular rate.  ABD: Soft non tender.   Ext: No edema  MS: Adequate ROM spine, shoulders, hips and knees.  Skin: Intact, no ulcerations or rash noted.  Psych: Good eye contact, flat  affect. Memory intact  depressed appearing.  CNS: CN 2-12 intact, power,  normal throughout.no focal deficits noted.   Assessment & Plan  Headache Uncontrolled, increase topamax dose amd re educated re appropriate use of maxalt, also refer to Neurology  MDD (major depressive disorder), recurrent episode, moderate  (Olivia Woodward) Severe, managed by Psychiatry, not suicidal or homicidal, still unable to functionon a job  Morbid obesity [facility])  Patient re-educated about  the importance of commitment to a  minimum of 150 minutes of exercise per week as able.  The importance of healthy food choices with portion control discussed, as well as eating regularly and within a 12 hour window most days. The need to choose "clean , green" food 50 to 75% of the time is discussed, as well as to make water the primary drink and set a goal of 64 ounces water daily.    Weight /BMI 06/19/2021 06/11/2021 05/08/2021  WEIGHT 253 lb 254 lb 249 lb  HEIGHT '5\' 4"'$  '5\' 4"'$  '5\' 4"'$   BMI 43.43 kg/m2 43.6 kg/m2 42.74 kg/m2  Some encounter information is confidential and restricted. Go to Review Flowsheets activity to see all data.      Subclinical hyperthyroidism Currently being treated by Endo, has radioactive iodine treatment later today

## 2021-06-25 ENCOUNTER — Telehealth (INDEPENDENT_AMBULATORY_CARE_PROVIDER_SITE_OTHER): Payer: 59 | Admitting: Psychiatry

## 2021-06-25 ENCOUNTER — Other Ambulatory Visit: Payer: Self-pay

## 2021-06-25 ENCOUNTER — Encounter: Payer: Self-pay | Admitting: Family Medicine

## 2021-06-25 ENCOUNTER — Encounter: Payer: Self-pay | Admitting: Psychiatry

## 2021-06-25 DIAGNOSIS — G4700 Insomnia, unspecified: Secondary | ICD-10-CM | POA: Diagnosis not present

## 2021-06-25 DIAGNOSIS — F419 Anxiety disorder, unspecified: Secondary | ICD-10-CM | POA: Diagnosis not present

## 2021-06-25 DIAGNOSIS — F331 Major depressive disorder, recurrent, moderate: Secondary | ICD-10-CM

## 2021-06-25 MED ORDER — ESZOPICLONE 1 MG PO TABS: 1.0000 mg | ORAL_TABLET | Freq: Every evening | ORAL | 0 refills | Status: DC | PRN

## 2021-06-25 MED ORDER — BUPROPION HCL ER (XL) 150 MG PO TB24: 150.0000 mg | ORAL_TABLET | Freq: Every day | ORAL | 0 refills | Status: DC

## 2021-06-25 MED ORDER — ALPRAZOLAM 0.25 MG PO TABS: 0.2500 mg | ORAL_TABLET | Freq: Two times a day (BID) | ORAL | 1 refills | Status: DC | PRN

## 2021-06-25 NOTE — Assessment & Plan Note (Signed)
Currently being treated by Endo, has radioactive iodine treatment later today

## 2021-06-25 NOTE — Assessment & Plan Note (Signed)
Uncontrolled, increase topamax dose amd re educated re appropriate use of maxalt, also refer to Neurology

## 2021-06-25 NOTE — Assessment & Plan Note (Signed)
  Patient re-educated about  the importance of commitment to a  minimum of 150 minutes of exercise per week as able.  The importance of healthy food choices with portion control discussed, as well as eating regularly and within a 12 hour window most days. The need to choose "clean , green" food 50 to 75% of the time is discussed, as well as to make water the primary drink and set a goal of 64 ounces water daily.    Weight /BMI 06/19/2021 06/11/2021 05/08/2021  WEIGHT 253 lb 254 lb 249 lb  HEIGHT '5\' 4"'$  '5\' 4"'$  '5\' 4"'$   BMI 43.43 kg/m2 43.6 kg/m2 42.74 kg/m2  Some encounter information is confidential and restricted. Go to Review Flowsheets activity to see all data.

## 2021-06-25 NOTE — Assessment & Plan Note (Signed)
Severe, managed by Psychiatry, not suicidal or homicidal, still unable to functionon a job

## 2021-06-26 ENCOUNTER — Telehealth (INDEPENDENT_AMBULATORY_CARE_PROVIDER_SITE_OTHER): Payer: 59 | Admitting: Licensed Clinical Social Worker

## 2021-06-26 ENCOUNTER — Ambulatory Visit: Payer: 59 | Admitting: Licensed Clinical Social Worker

## 2021-06-26 DIAGNOSIS — F331 Major depressive disorder, recurrent, moderate: Secondary | ICD-10-CM

## 2021-06-26 NOTE — BH Specialist Note (Signed)
[place] Telephone Follow-up  MRN: [id number] NAME: Olivia Woodward Date: 06/26/21  Start time: 1130a  End time:  1151a Total time:  20 min Call number:  [phone]  Reason for call today:  follow up  PHQ-9 Scores:  Depression screen [facility] 2/9 06/26/2021 06/19/2021 06/13/2021 05/08/2021 05/04/2021  Decreased Interest '3 3 3 3 2  '$ Down, Depressed, Hopeless '3 3 3 3 3  '$ PHQ - 2 Score '6 6 6 6 5  '$ Altered sleeping '2 3 2 3 1  '$ Tired, decreased energy '2 3 2 3 2  '$ Change in appetite '1 3 2 3 1  '$ Feeling bad or failure about yourself  2 0 '1 3 2  '$ Trouble concentrating '1 3 1 3 2  '$ Moving slowly or fidgety/restless 1 0 1 0 2  Suicidal thoughts 0 0 0 1 0  PHQ-9 Score '15 18 15 22 15  '$ Difficult doing work/chores Very difficult - Very difficult Very difficult Very difficult  Some recent data might be hidden   GAD-7 Scores:  GAD 7 : Generalized Anxiety Score 06/13/2021 05/04/2021 03/17/2019 10/09/2018  Nervous, Anxious, on Edge '2 2 1 3  '$ Control/stop worrying '2 2 1 3  '$ Worry too much - different things '2 2 1 3  '$ Trouble relaxing '2 2 1 3  '$ Restless 2 1 0 3  Easily annoyed or irritable 2 2 0 3  Afraid - awful might happen '1 1 1 1  '$ Total GAD 7 Score '13 12 5 19  '$ Anxiety Difficulty Very difficult Very difficult - Extremely difficult    Stress Current stressors:  health, her son lack of financial literacy Sleep:  fair Appetite:  fair Coping ability:  overwhelmed Patient taking medications as prescribed:  yes  Current medications:  Outpatient Encounter Medications as of 06/26/2021  Medication Sig   albuterol (VENTOLIN HFA) 108 (90 Base) MCG/ACT inhaler Inhale 2 puffs into the lungs every 6 (six) hours as needed for wheezing or shortness of breath.   [START ON 07/02/2021] ALPRAZolam (XANAX) 0.25 MG tablet Take 1 tablet (0.25 mg total) by mouth 2 (two) times daily as needed for anxiety.   ALPRAZolam (XANAX) 1 MG tablet Take 1 tablet (1 mg total) by mouth every evening.   amLODipine (NORVASC) 10 MG  tablet Take 1 tablet (10 mg total) by mouth daily.   buPROPion (WELLBUTRIN XL) 150 MG 24 hr tablet Take 1 tablet (150 mg total) by mouth daily.   Cyanocobalamin (B-12) 2500 MCG TABS Take 1 tablet by mouth daily.   cyclobenzaprine (FLEXERIL) 10 MG tablet Take one tablet by mouth at bedtime , for neck spasm   ergocalciferol (VITAMIN D2) 1.25 MG (50000 UT) capsule Take 1 capsule (50,000 Units total) by mouth once a week. One capsule once weekly   escitalopram (LEXAPRO) 20 MG tablet 10 mg daily for one week, then 20 mg daily   eszopiclone (LUNESTA) 1 MG TABS tablet Take 1 tablet (1 mg total) by mouth at bedtime as needed for sleep. Take immediately before bedtime   famotidine (PEPCID) 40 MG tablet Take 1 tablet (40 mg total) by mouth daily.   ibuprofen (ADVIL) 800 MG tablet Take 1 tablet (800 mg total) by mouth every 8 (eight) hours as needed.   ondansetron (ZOFRAN) 4 MG tablet TAKE 1 TABLET BY MOUTH EVERY 8 HOURS AS NEEDED FOR NAUSEA OR VOMITING   predniSONE (DELTASONE) 20 MG tablet Take one tablet by mouth 3 times daily for 2 days , then one tablet twice daily for  2 days, then one tablet once daily for 3 days, then one tablet every other day for 6 days, then stop   promethazine-dextromethorphan (PROMETHAZINE-DM) 6.25-15 MG/5ML syrup Take one teaspoon at bedtime , as needed, for excessive cough   rizatriptan (MAXALT) 10 MG tablet Take 1 tablet (10 mg total) by mouth as needed for migraine. May repeat in 2 hours if needed   spironolactone (ALDACTONE) 100 MG tablet Take 1 tablet (100 mg total) by mouth daily.   topiramate (TOPAMAX) 100 MG tablet Take 1 tablet (100 mg total) by mouth 2 (two) times daily.   No facility-administered encounter medications on file as of 06/26/2021.     Self-harm Behaviors Risk Assessment Self-harm risk factors:   Patient endorses recent thoughts of harming self:    Olivia Woodward Suicide Severity Rating Scale: No flowsheet data found. C-SRSS 07/16/2018 05/04/2021 05/08/2021  2021-06-25 07/17/21  1. Wish to be Dead No No No No No  2. Suicidal Thoughts No No No No No  3. Suicidal Thoughts with Method Without Specific Plan or Intent to Act - - No No No  4. Suicidal Intent Without Specific Plan - - No No No  5. Suicide Intent with Specific Plan - - No No No  6. Suicide Behavior Question No No No No No  7. How long ago did you do any of these? - - Between three months and a year ago Within the last three months Within the last three months     Danger to Others Risk Assessment Danger to others risk factors:  no Patient endorses recent thoughts of harming others:    Dynamic Appraisal of Situational Aggression (DASA): No flowsheet data found.   Substance Use Assessment Patient recently consumed alcohol:  no  Alcohol Use Disorder Identification Test (AUDIT):  Alcohol Use Disorder Test (AUDIT) 07/16/2018  1. How often do you have a drink containing alcohol? 0  2. How many drinks containing alcohol do you have on a typical day when you are drinking? 0  3. How often do you have six or more drinks on one occasion? 0  AUDIT-C Score 0  Alcohol Brief Interventions/Follow-up AUDIT Score <7 follow-up not indicated   Patient recently used drugs:  no  Opioid Risk Assessment:    Goals, Interventions and Follow-up Plan Goals: Increase healthy adjustment to current life circumstances Interventions: Motivational Interviewing, Solution-Focused Strategies, Mindfulness or Psychologist, educational, Veterinary surgeon, and CBT Cognitive Behavioral Therapy Follow-up Plan:  Weekly VBH sessions  Summary: Olivia Woodward is a 55 yr old woman referred by Dr. Moshe Cipro.  [name] continues to display symptoms of depression such as low energy, sadness, lack of motivation, and increase in sleep.  She is taking medication to assist with her overactive thyroid and is hopeful that normalizing her thyroid will reduce her symptoms. She is currently having a trigger from her son who consistently is demanding  financial obligations onto her.  She has a desire to return to work in September 2022 due to her father paying her bills and taking care of her and her children  Lubertha South, [place]

## 2021-06-27 ENCOUNTER — Encounter (HOSPITAL_COMMUNITY): Payer: Self-pay | Admitting: Hematology

## 2021-07-04 ENCOUNTER — Telehealth: Payer: Self-pay | Admitting: Licensed Clinical Social Worker

## 2021-07-04 DIAGNOSIS — F331 Major depressive disorder, recurrent, moderate: Secondary | ICD-10-CM

## 2021-07-04 NOTE — BH Specialist Note (Signed)
[facility]al [facility] Treatment Plan Team Note  MRN: [id number] NAME: Olivia Woodward  DATE: 07/04/21  Start time:   415pEnd time:  420p Total time:  5 min  Total number of Virtual [place] Treatment Team Plan encounters: 3/4  Treatment Team Attendees: [name], LCSW, [name], Psychiatrist  Diagnoses: No diagnosis found.  Goals, Interventions and Follow-up Plan Goals: Increase healthy adjustment to current life circumstances Interventions: Motivational Interviewing Solution-Focused Tree surgeon Therapy Medication Management Recommendations: no changes Follow-up Plan: Weekly VBH sessions  History of the present illness Presenting Problem/Current Symptoms:  Continued symptoms of diagnosis   Psychiatric History  Depression: Yes Anxiety: No Mania: No Psychosis: No PTSD symptoms: No  Past Psychiatric History/Hospitalization(s): Hospitalization for psychiatric illness: No Prior Suicide Attempts: No Prior Self-injurious behavior: No  Psychosocial stressors Flowsheet Row Virtual BH Phone Follow Up from 08/04/2018 in [facility]  Current Stressors Family death  [Medication is not helping her level of depression and anxiety. ]  Familial Stressors None  Sleep Decreased, Difficulty falling asleep, Difficulty staying asleep       Self-harm Behaviors Risk Assessment Flowsheet Row Virtual BH Phone Follow Up from 07/16/2018 in [facility]  Self-harm risk factors --  [None Reported]  Have you recently had any thoughts about harming yourself? No       Screenings PHQ-9 Assessments:  Depression screen [facility] 2/9 06/26/2021 06/19/2021 06/13/2021  Decreased Interest '3 3 3  '$ Down, Depressed, Hopeless '3 3 3  '$ PHQ - 2 Score '6 6 6  '$ Altered sleeping '2 3 2  '$ Tired, decreased energy '2 3 2  '$ Change in appetite '1 3 2  '$ Feeling bad or failure about yourself  2 0 1  Trouble  concentrating '1 3 1  '$ Moving slowly or fidgety/restless 1 0 1  Suicidal thoughts 0 0 0  PHQ-9 Score '15 18 15  '$ Difficult doing work/chores Very difficult - Very difficult  Some recent data might be hidden   GAD-7 Assessments:  GAD 7 : Generalized Anxiety Score 06/26/2021 06/13/2021 05/04/2021 03/17/2019  Nervous, Anxious, on Edge '2 2 2 1  '$ Control/stop worrying '2 2 2 1  '$ Worry too much - different things '2 2 2 1  '$ Trouble relaxing '2 2 2 1  '$ Restless '2 2 1 '$ 0  Easily annoyed or irritable '2 2 2 '$ 0  Afraid - awful might happen '2 1 1 1  '$ Total GAD 7 Score '14 13 12 5  '$ Anxiety Difficulty Somewhat difficult Very difficult Very difficult -    Past Medical History Past Medical History:  Diagnosis Date   Anemia    Anxiety    Phreesia 03/18/2021   Depression    Phreesia 03/18/2021   Generalized headaches    Hypertension     Vital signs: There were no vitals filed for this visit.  Allergies:  Allergies as of 07/04/2021 - Review Complete 06/25/2021  Allergen Reaction Noted   Ketorolac tromethamine Hives 03/16/2009    Medication History Current medications:  Outpatient Encounter Medications as of 07/04/2021  Medication Sig   albuterol (VENTOLIN HFA) 108 (90 Base) MCG/ACT inhaler Inhale 2 puffs into the lungs every 6 (six) hours as needed for wheezing or shortness of breath.   ALPRAZolam (XANAX) 0.25 MG tablet Take 1 tablet (0.25 mg total) by mouth 2 (two) times daily as needed for anxiety.   ALPRAZolam (XANAX) 1 MG tablet Take 1 tablet (1 mg total) by mouth every evening.   amLODipine (NORVASC)  10 MG tablet Take 1 tablet (10 mg total) by mouth daily.   buPROPion (WELLBUTRIN XL) 150 MG 24 hr tablet Take 1 tablet (150 mg total) by mouth daily.   Cyanocobalamin (B-12) 2500 MCG TABS Take 1 tablet by mouth daily.   cyclobenzaprine (FLEXERIL) 10 MG tablet Take one tablet by mouth at bedtime , for neck spasm   ergocalciferol (VITAMIN D2) 1.25 MG (50000 UT) capsule Take 1 capsule (50,000 Units total)  by mouth once a week. One capsule once weekly   escitalopram (LEXAPRO) 20 MG tablet 10 mg daily for one week, then 20 mg daily   eszopiclone (LUNESTA) 1 MG TABS tablet Take 1 tablet (1 mg total) by mouth at bedtime as needed for sleep. Take immediately before bedtime   famotidine (PEPCID) 40 MG tablet Take 1 tablet (40 mg total) by mouth daily.   ibuprofen (ADVIL) 800 MG tablet Take 1 tablet (800 mg total) by mouth every 8 (eight) hours as needed.   ondansetron (ZOFRAN) 4 MG tablet TAKE 1 TABLET BY MOUTH EVERY 8 HOURS AS NEEDED FOR NAUSEA OR VOMITING   predniSONE (DELTASONE) 20 MG tablet Take one tablet by mouth 3 times daily for 2 days , then one tablet twice daily for 2 days, then one tablet once daily for 3 days, then one tablet every other day for 6 days, then stop   promethazine-dextromethorphan (PROMETHAZINE-DM) 6.25-15 MG/5ML syrup Take one teaspoon at bedtime , as needed, for excessive cough   rizatriptan (MAXALT) 10 MG tablet Take 1 tablet (10 mg total) by mouth as needed for migraine. May repeat in 2 hours if needed   spironolactone (ALDACTONE) 100 MG tablet Take 1 tablet (100 mg total) by mouth daily.   topiramate (TOPAMAX) 100 MG tablet Take 1 tablet (100 mg total) by mouth 2 (two) times daily.   No facility-administered encounter medications on file as of 07/04/2021.     Scribe for Treatment Team: Lubertha South, LCSW

## 2021-07-05 ENCOUNTER — Telehealth (INDEPENDENT_AMBULATORY_CARE_PROVIDER_SITE_OTHER): Payer: 59 | Admitting: Licensed Clinical Social Worker

## 2021-07-05 ENCOUNTER — Other Ambulatory Visit: Payer: Self-pay

## 2021-07-05 DIAGNOSIS — F331 Major depressive disorder, recurrent, moderate: Secondary | ICD-10-CM

## 2021-07-09 ENCOUNTER — Telehealth: Payer: Self-pay

## 2021-07-09 ENCOUNTER — Other Ambulatory Visit: Payer: Self-pay | Admitting: Psychiatry

## 2021-07-09 MED ORDER — ESZOPICLONE 1 MG PO TABS: 1.0000 mg | ORAL_TABLET | Freq: Every evening | ORAL | 0 refills | Status: DC | PRN

## 2021-07-09 MED ORDER — BUPROPION HCL ER (XL) 150 MG PO TB24: 150.0000 mg | ORAL_TABLET | Freq: Every day | ORAL | 0 refills | Status: DC

## 2021-07-09 MED ORDER — ESCITALOPRAM OXALATE 20 MG PO TABS: ORAL_TABLET | ORAL | 0 refills | Status: DC

## 2021-07-09 NOTE — Telephone Encounter (Signed)
pt called to find out how to take her medication. she was given info.  pt stated that she would need refills on her medications.

## 2021-07-09 NOTE — Telephone Encounter (Signed)
Ordered

## 2021-07-19 ENCOUNTER — Other Ambulatory Visit: Payer: Self-pay | Admitting: "Endocrinology

## 2021-07-19 ENCOUNTER — Telehealth: Payer: Self-pay | Admitting: "Endocrinology

## 2021-07-19 DIAGNOSIS — E042 Nontoxic multinodular goiter: Secondary | ICD-10-CM

## 2021-07-19 NOTE — Telephone Encounter (Signed)
Patient called and states for the past 2 weeks she is having trouble swallowing her pills. She is wanting to know if this is due to her thyroid. [phone]

## 2021-07-19 NOTE — Telephone Encounter (Signed)
Discussed with pt, understanding voiced. Order for thyroid ultrasound placed

## 2021-07-19 NOTE — Telephone Encounter (Signed)
Pt states for the past 2 weeks when she takes her medication "the pills get stuck in my throat" and has experienced a difficult time getting them down even with drinking large amounts of water or trying to eat bread behind her meds. States when she swallows her food it feels different but the food does not become lodged. Pt requested your opinion if her thyroid could be contributing.

## 2021-07-25 NOTE — Progress Notes (Signed)
Virtual Visit via Video Note  I connected with Olivia Woodward on 07/27/21 at 10:00 AM EDT by a video enabled telemedicine application and verified that I am speaking with the correct person using two identifiers.  Location: Patient: home Provider: office Persons participated in the visit- patient, provider    I discussed the limitations of evaluation and management by telemedicine and the availability of in person appointments. The patient expressed understanding and agreed to proceed.   I discussed the assessment and treatment plan with the patient. The patient was provided an opportunity to ask questions and all were answered. The patient agreed with the plan and demonstrated an understanding of the instructions.   The patient was advised to call back or seek an in-person evaluation if the symptoms worsen or if the condition fails to improve as anticipated.  I provided 21 minutes of non-face-to-face time during this encounter.   Olivia Clay, MD     [facility] MD/PA/NP OP Progress Note  07/27/2021 10:38 AM Olivia Woodward  MRN:  [id number]  Chief Complaint:  Chief Complaint   Follow-up; Depression    HPI:  This is a follow-up appointment for depression and anxiety.  She states that although she feels a little better, she also states that she does not know how she is feeling.  She states that she had not started bupropion until lately.  She was unsure if she should have taking half pills (which was directed a few months ago for other medication).  She was able to talk with the nurse, and has been on bupropion for the past few weeks.  She feels more jittery, nausea and has dry mouth.  She has not experienced these before starting bupropion.  She goes to church at times.  She does not know what to do when she is asked about her daily routine.  She feels fatigued and does not have the energy.  She states that her kids states that she has been "mean "to them.  She also continues to be  forgetful, and she needs to write down everything.  Although she wants to return to work, she is unsure if she can do it due to her current condition.  She continues to feel depressed and has insomnia.  When reviewing her medication, she does not recall if she feels the [place], although the data indicates that she filled this medication.  She has poor appetite.  She denies SI, stating that she loves her kids. .  She denies AH, VH.  She is willing to try Abilify at this time.     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 (80 year old daughter, and 16 yo son, going to college in 2022) Education: graduated from [employer], attended [employer]   Visit Diagnosis:    ICD-10-CM   1. MDD (major depressive disorder), recurrent episode, moderate (HCC)  F33.1     2. Anxiety  F41.9 ALPRAZolam (XANAX) 1 MG tablet      Past Psychiatric History: Please see initial evaluation for full details. I have reviewed the history. No updates at this time.     Past Medical History:  Past Medical History:  Diagnosis Date   Anemia  Anxiety    Phreesia 03/18/2021   Depression    Phreesia 03/18/2021   Generalized headaches    Hypertension     Past Surgical History:  Procedure Laterality Date   ABDOMINAL HYSTERECTOMY     fibroids, partial   BREAST REDUCTION SURGERY  2005   COLONOSCOPY WITH PROPOFOL N/A 05/10/2019   Procedure: COLONOSCOPY WITH PROPOFOL;  Surgeon: Daneil Dolin, MD;  [facility]: [facility];  Service: Endoscopy;  Laterality: N/A;  2:45pm   GASTRIC BYPASS  2007   POLYPECTOMY  05/10/2019   Procedure: POLYPECTOMY;  Surgeon: Daneil Dolin, MD;  Location: [facility];   Service: Endoscopy;;  colon    Family Psychiatric History: Please see initial evaluation for full details. I have reviewed the history. No updates at this time.     Family History:  Family History  Problem Relation Age of Onset   Hypertension Mother    Diabetes Mother    Drug abuse Mother    Hypertension Father    Hypertension Sister    Hypertension Sister    Colon cancer Neg Hx     Social History:  Social History   Socioeconomic History   Marital status: Single    Spouse name: Not on file   Number of children: Not on file   Years of education: Not on file   Highest education level: Not on file  Occupational History   Not on file  Tobacco Use   Smoking status: Never   Smokeless tobacco: Never  Substance and Sexual Activity   Alcohol use: No   Drug use: No   Sexual activity: Yes    Birth control/protection: Condom  Other Topics Concern   Not on file  Social History Narrative   Not on file   Social Determinants of Health   Financial Resource Strain: Not on file  Food Insecurity: Not on file  Transportation Needs: Not on file  Physical Activity: Not on file  Stress: Not on file  Social Connections: Not on file    Allergies:  Allergies  Allergen Reactions   Ketorolac Tromethamine Hives    Lips swelled    Metabolic Disorder Labs: Lab Results  Component Value Date   HGBA1C 5.4 03/22/2021   No results found for: PROLACTIN Lab Results  Component Value Date   CHOL 164 03/22/2021   TRIG 73 03/22/2021   HDL 69 03/22/2021   CHOLHDL 2.4 03/22/2021   VLDL 10 01/02/2017   LDLCALC 81 03/22/2021   LDLCALC 124 (H) 09/06/2020   Lab Results  Component Value Date   TSH 0.409 (L) 03/22/2021   TSH 0.278 (L) 09/06/2020   TSH 0.279 (L) 09/06/2020    Therapeutic Level Labs: No results found for: LITHIUM No results found for: VALPROATE No components found for:  CBMZ  Current Medications: Current Outpatient Medications  Medication Sig Dispense Refill    ARIPiprazole (ABILIFY) 2 MG tablet Take 1 tablet (2 mg total) by mouth at bedtime. 30 tablet 1   albuterol (VENTOLIN HFA) 108 (90 Base) MCG/ACT inhaler Inhale 2 puffs into the lungs every 6 (six) hours as needed for wheezing or shortness of breath. 18 g 0   ALPRAZolam (XANAX) 0.25 MG tablet Take 1 tablet (0.25 mg total) by mouth 2 (two) times daily as needed for anxiety. 60 tablet 1   [START ON 08/08/2021] ALPRAZolam (XANAX) 1 MG tablet Take 1 tablet (1 mg total) by mouth every evening. 30 tablet 0   amLODipine (NORVASC) 10 MG tablet Take  1 tablet (10 mg total) by mouth daily. 90 tablet 3   Cyanocobalamin (B-12) 2500 MCG TABS Take 1 tablet by mouth daily.     cyclobenzaprine (FLEXERIL) 10 MG tablet Take one tablet by mouth at bedtime , for neck spasm 30 tablet 0   ergocalciferol (VITAMIN D2) 1.25 MG (50000 UT) capsule Take 1 capsule (50,000 Units total) by mouth once a week. One capsule once weekly 12 capsule 2   [START ON 08/25/2021] escitalopram (LEXAPRO) 20 MG tablet Take 1 tablet (20 mg total) by mouth daily. Fill after 10/1 30 tablet 2   eszopiclone (LUNESTA) 1 MG TABS tablet Take 1 tablet (1 mg total) by mouth at bedtime as needed for sleep. Take immediately before bedtime 30 tablet 0   famotidine (PEPCID) 40 MG tablet Take 1 tablet (40 mg total) by mouth daily. 30 tablet 0   ibuprofen (ADVIL) 800 MG tablet Take 1 tablet (800 mg total) by mouth every 8 (eight) hours as needed. 30 tablet 0   ondansetron (ZOFRAN) 4 MG tablet TAKE 1 TABLET BY MOUTH EVERY 8 HOURS AS NEEDED FOR NAUSEA OR VOMITING 20 tablet 0   predniSONE (DELTASONE) 20 MG tablet Take one tablet by mouth 3 times daily for 2 days , then one tablet twice daily for 2 days, then one tablet once daily for 3 days, then one tablet every other day for 6 days, then stop 16 tablet 0   promethazine-dextromethorphan (PROMETHAZINE-DM) 6.25-15 MG/5ML syrup Take one teaspoon at bedtime , as needed, for excessive cough 180 mL 0   rizatriptan (MAXALT)  10 MG tablet Take 1 tablet (10 mg total) by mouth as needed for migraine. May repeat in 2 hours if needed 10 tablet 0   spironolactone (ALDACTONE) 100 MG tablet Take 1 tablet (100 mg total) by mouth daily. 30 tablet 3   topiramate (TOPAMAX) 100 MG tablet Take 1 tablet (100 mg total) by mouth 2 (two) times daily. 60 tablet 3   No current facility-administered medications for this visit.     Musculoskeletal: Strength & Muscle Tone:  N/A Gait & Station:  N/A Patient leans: N/A  Psychiatric Specialty Exam: Review of Systems  Psychiatric/Behavioral:  Positive for decreased concentration, dysphoric mood and sleep disturbance. Negative for agitation, behavioral problems, confusion, hallucinations, self-injury and suicidal ideas. The patient is nervous/anxious. The patient is not hyperactive.   All other systems reviewed and are negative.  There were no vitals taken for this visit.There is no height or weight on file to calculate BMI.  General Appearance: Fairly Groomed  Eye Contact:  Good  Speech:  Clear and Coherent  Volume:  Normal  Mood:  Depressed  Affect:  Appropriate, Congruent, Restricted, and down, fatigue  Thought Process:  Coherent  Orientation:  Full (Time, Place, and Person)  Thought Content: Logical   Suicidal Thoughts:  No  Homicidal Thoughts:  No  Memory:  Immediate;   Poor  Judgement:  Good  Insight:  Fair  Psychomotor Activity:  Normal  Concentration:  Concentration: Poor and Attention Span: Poor  Recall:  Good  Fund of Knowledge: Good  Language: Good  Akathisia:  No  Handed:  Right  AIMS (if indicated): not done  Assets:  Communication Skills Desire for Improvement  ADL's:  Intact  Cognition: WNL  Sleep:  Poor   Screenings: GAD-7    Flowsheet Row Virtual BH Phone Follow Up from 06/26/2021 in [facility] Video Visit from 06/12/2021 in [facility] Video Visit from 05/04/2021 in  [facility] Office Visit from  03/17/2019 in [facility] Counselor from 10/09/2018 in [facility]  Total GAD-7 Score '14 13 12 5 19      '$ PHQ2-9    [place] Phone Follow Up from 06/26/2021 in [facility] Office Visit from 06/19/2021 in [facility] Video Visit from 06/12/2021 in [facility] Office Visit from 05/08/2021 in [facility] Video Visit from 05/04/2021 in [facility]  PHQ-2 Total Score '6 6 6 6 5  '$ PHQ-9 Total Score '15 18 15 22 15      '$ Flowsheet Row Video Visit from 07/27/2021 in [facility] Office Visit from 06/19/2021 in [facility] Video Visit from 05/28/2021 in [facility] Error: Question 6 not populated Error: Q3, 4, or 5 should not be populated when Q2 is No Error: Q3, 4, or 5 should not be populated when Q2 is No        Assessment and Plan:  LUCYNDA KIL is a 55 y.o. year old female with a history of depression, iron deficiency anemia,  multinodular goiter, subclinical hyperthyroidism, vitamin D deficiency, hypertension, s/p gastric bypass surgery in 2017, who presents for follow up appointment for below.   1. Anxiety 2. MDD (major depressive disorder), recurrent episode, moderate (No Name) Exam is notable for difficulty in concentration, and she continues to report depressive symptoms and anxiety since the last visit.  She could not tolerate bupropion due to adverse reaction.  Psychosocial stressors includes her daughter , who was molested by a nephew few years ago, and conflict with her family and this issues.  We discontinued bupropion due to this adverse reaction.  We will start Abilify adjunctive treatment for depression.  Discussed potential metabolic side effect and EPS.  Will continue Lexapro to target depression and anxiety.  Will continue Xanax as needed for anxiety.  She will  continue to see a therapist through [facility]    # Insomnia She reports initial insomnia.  She was recently observed to have snoring by others.  She has fatigue.  Although referral was made for sleep evaluation, the practice does not take her insurance.  She agrees to contact her insurance company to see which provider takes her insurance.  Noted that she does not recall she has started on Lunesta.  Will start this medication for insomnia.   This clinician has discussed the side effect associated with medication prescribed during this encounter. Please refer to notes in the previous encounters for more details.     Plan Continue lexapro 20 mg daily Start Abilify 2 mg daily  Start Lunesta 1 mg at night as needed for sleep Continue Xanax 0.25 mg in AM, 1 mg at night as needed for anxiety  Next appointment: 10/14 at 9:30 for 30 mins video   I would support that she would remain out from work for 3 months from today, with expected return date on December 9 th.  She has depressive symptoms with significant impairment in cognition/memory and fatigue, which interferes with her ability to work at her capacity.      Past trials of medication: sertraline fluoxetine, lexapro, venlafaxine ("funny"), bupropion (dry mouth, nausea), quetiapine (drowsiness), Xanax, temazepam, Trazodone, Ambien, Belsomra (could not afford)   I have reviewed suicide assessment in detail. No change in the following assessment.    The patient demonstrates the following risk factors for suicide: Chronic risk  factors for suicide include: psychiatric disorder of depression. Acute risk factors for suicide include: loss (financial, interpersonal, professional). Protective factors for this patient include: responsibility to others (children, family), coping skills, hope for the future and religious beliefs against suicide. Considering these factors, the overall suicide risk at this point appears to be low. Patient is appropriate for outpatient  follow up.  Olivia Clay, MD 07/27/2021, 10:38 AM

## 2021-07-26 ENCOUNTER — Telehealth: Payer: 59 | Admitting: Psychiatry

## 2021-07-27 ENCOUNTER — Encounter: Payer: Self-pay | Admitting: Psychiatry

## 2021-07-27 ENCOUNTER — Ambulatory Visit (HOSPITAL_COMMUNITY)
Admission: RE | Admit: 2021-07-27 | Discharge: 2021-07-27 | Disposition: A | Discharge status: Home / Self Care | Payer: 59 | Source: Ambulatory Visit | Attending: "Endocrinology | Admitting: "Endocrinology

## 2021-07-27 ENCOUNTER — Other Ambulatory Visit: Payer: Self-pay

## 2021-07-27 ENCOUNTER — Telehealth (INDEPENDENT_AMBULATORY_CARE_PROVIDER_SITE_OTHER): Payer: 59 | Admitting: Psychiatry

## 2021-07-27 DIAGNOSIS — F419 Anxiety disorder, unspecified: Secondary | ICD-10-CM

## 2021-07-27 DIAGNOSIS — E042 Nontoxic multinodular goiter: Secondary | ICD-10-CM | POA: Insufficient documentation

## 2021-07-27 DIAGNOSIS — F331 Major depressive disorder, recurrent, moderate: Secondary | ICD-10-CM | POA: Diagnosis not present

## 2021-07-27 MED ORDER — ESCITALOPRAM OXALATE 20 MG PO TABS: 20.0000 mg | ORAL_TABLET | Freq: Every day | ORAL | 2 refills | Status: DC

## 2021-07-27 MED ORDER — ARIPIPRAZOLE 2 MG PO TABS: 2.0000 mg | ORAL_TABLET | Freq: Every day | ORAL | 1 refills | Status: DC

## 2021-07-27 MED ORDER — ALPRAZOLAM 1 MG PO TABS: 1.0000 mg | ORAL_TABLET | Freq: Every evening | ORAL | 0 refills | Status: DC

## 2021-07-27 NOTE — Patient Instructions (Signed)
Continue lexapro 20 mg daily Start Abilify 2 mg daily  Start Lunesta 1 mg at night as needed for sleep Continue Xanax 0.25 mg in AM, 1 mg at night as needed for anxiety  Next appointment: 10/14 at 9:30

## 2021-08-01 ENCOUNTER — Telehealth: Payer: Self-pay | Admitting: Licensed Clinical Social Worker

## 2021-08-01 DIAGNOSIS — F331 Major depressive disorder, recurrent, moderate: Secondary | ICD-10-CM

## 2021-08-01 NOTE — Progress Notes (Signed)
[place] Follow Up Assessment  MRN: [id number] NAME: Olivia Woodward Date: 08/01/21  Start time: 1130a End time: 12p Total time: 30  Type of Contact: Follow up Call  Current concerns/stressors: financial stress   Functional Assessment:  Sleep: poor Appetite: poor Coping ability: overwhelmed Patient taking medications as prescribed:   yes Current medications:  Outpatient Encounter Medications as of 06/19/2021  Medication Sig   albuterol (VENTOLIN HFA) 108 (90 Base) MCG/ACT inhaler Inhale 2 puffs into the lungs every 6 (six) hours as needed for wheezing or shortness of breath.   amLODipine (NORVASC) 10 MG tablet Take 1 tablet (10 mg total) by mouth daily.   Cyanocobalamin (B-12) 2500 MCG TABS Take 1 tablet by mouth daily.   cyclobenzaprine (FLEXERIL) 10 MG tablet Take one tablet by mouth at bedtime , for neck spasm   ergocalciferol (VITAMIN D2) 1.25 MG (50000 UT) capsule Take 1 capsule (50,000 Units total) by mouth once a week. One capsule once weekly   famotidine (PEPCID) 40 MG tablet Take 1 tablet (40 mg total) by mouth daily.   ondansetron (ZOFRAN) 4 MG tablet TAKE 1 TABLET BY MOUTH EVERY 8 HOURS AS NEEDED FOR NAUSEA OR VOMITING   predniSONE (DELTASONE) 20 MG tablet Take one tablet by mouth 3 times daily for 2 days , then one tablet twice daily for 2 days, then one tablet once daily for 3 days, then one tablet every other day for 6 days, then stop   promethazine-dextromethorphan (PROMETHAZINE-DM) 6.25-15 MG/5ML syrup Take one teaspoon at bedtime , as needed, for excessive cough   rizatriptan (MAXALT) 10 MG tablet Take 1 tablet (10 mg total) by mouth as needed for migraine. May repeat in 2 hours if needed   spironolactone (ALDACTONE) 100 MG tablet Take 1 tablet (100 mg total) by mouth daily.   topiramate (TOPAMAX) 100 MG tablet Take 1 tablet (100 mg total) by mouth 2 (two) times daily.   [DISCONTINUED] ALPRAZolam (XANAX) 0.25 MG tablet Take 1 tablet (0.25 mg total) by  mouth 2 (two) times daily as needed for anxiety.   [DISCONTINUED] ALPRAZolam (XANAX) 1 MG tablet Take 1 tablet (1 mg total) by mouth every evening.   [DISCONTINUED] escitalopram (LEXAPRO) 20 MG tablet 10 mg daily for one week, then 20 mg daily   No facility-administered encounter medications on file as of 06/19/2021.    Self-harm and/or Suicidal Behaviors Risk Assessment Self-harm risk factors: no Patient endorses recent self injurious thoughts and/or behaviors: No   Suicide ideations: No plan to harm self or others   Danger to Others Risk Assessment Danger to others risk factors: no Patient endorses recent thoughts of harming others: No    Substance Use Assessment Patient recently consumed alcohol: No  Patient recently used drugs: No  Patient is concerned about dependence or abuse of substances: No    Goals, Interventions and Follow-up Plan Goals: Increase healthy adjustment to current life circumstances Interventions: Solution-Focused Strategies and Mindfulness or Relaxation Training   Summary of Clinical Assessment  Olivia Woodward is a 55 yr old woman who presents for follow up for her depression.  Olivia Woodward continues to have difficulty with staying focused, sadness, poor energy and lack of motivation.  She is eager to return to work due to financial strain on her parents (father) who assist with paying monthly bills.  She takes medication as prescribed and is attempting behavioral activation strategies.   Follow-up Plan:  weekly VBH sessions Olivia South, LCSW

## 2021-08-01 NOTE — Telephone Encounter (Signed)
Left message encouraging contact 

## 2021-08-02 ENCOUNTER — Other Ambulatory Visit (HOSPITAL_COMMUNITY)
Admission: RE | Admit: 2021-08-02 | Discharge: 2021-08-02 | Disposition: A | Discharge status: Home / Self Care | Payer: 59 | Source: Ambulatory Visit | Attending: Nurse Practitioner | Admitting: Nurse Practitioner

## 2021-08-02 DIAGNOSIS — E0590 Thyrotoxicosis, unspecified without thyrotoxic crisis or storm: Secondary | ICD-10-CM | POA: Diagnosis present

## 2021-08-02 LAB — TSH: TSH: 0.82 u[IU]/mL (ref 0.350–4.500)

## 2021-08-02 LAB — T4, FREE: Free T4: 0.74 ng/dL (ref 0.61–1.12)

## 2021-08-03 LAB — T3, FREE: T3, Free: 2.9 pg/mL (ref 2.0–4.4)

## 2021-08-07 ENCOUNTER — Ambulatory Visit (INDEPENDENT_AMBULATORY_CARE_PROVIDER_SITE_OTHER): Payer: PRIVATE HEALTH INSURANCE | Admitting: "Endocrinology

## 2021-08-07 ENCOUNTER — Encounter (HOSPITAL_COMMUNITY): Payer: Self-pay | Admitting: Hematology

## 2021-08-07 ENCOUNTER — Encounter: Payer: Self-pay | Admitting: "Endocrinology

## 2021-08-07 ENCOUNTER — Other Ambulatory Visit: Payer: Self-pay

## 2021-08-07 VITALS — BP 122/84 | HR 76 | Ht 64.0 in | Wt 252.2 lb

## 2021-08-07 DIAGNOSIS — E0520 Thyrotoxicosis with toxic multinodular goiter without thyrotoxic crisis or storm: Secondary | ICD-10-CM

## 2021-08-07 NOTE — Progress Notes (Signed)
08/07/2021, 5:03 PM  Endocrinology follow-up note   Subjective:    Patient ID: Olivia Woodward, female    DOB: 12-11-1965, PCP Fayrene Helper, MD   Past Medical History:  Diagnosis Date   Anemia    Anxiety    Phreesia 03/18/2021   Depression    Phreesia 03/18/2021   Generalized headaches    Hypertension    Past Surgical History:  Procedure Laterality Date   ABDOMINAL HYSTERECTOMY     fibroids, partial   BREAST REDUCTION SURGERY  2005   COLONOSCOPY WITH PROPOFOL N/A 05/10/2019   Procedure: COLONOSCOPY WITH PROPOFOL;  Surgeon: Daneil Dolin, MD;  [facility]: [facility];  Service: Endoscopy;  Laterality: N/A;  2:45pm   GASTRIC BYPASS  2007   POLYPECTOMY  05/10/2019   Procedure: POLYPECTOMY;  Surgeon: Daneil Dolin, MD;  Location: [facility];  Service: Endoscopy;;  colon   Social History   Socioeconomic History   Marital status: Single    Spouse name: Not on file   Number of children: Not on file   Years of education: Not on file   Highest education level: Not on file  Occupational History   Not on file  Tobacco Use   Smoking status: Never   Smokeless tobacco: Never  Substance and Sexual Activity   Alcohol use: No   Drug use: No   Sexual activity: Yes    Birth control/protection: Condom  Other Topics Concern   Not on file  Social History Narrative   Not on file   Social Determinants of Health   Financial Resource Strain: Not on file  Food Insecurity: Not on file  Transportation Needs: Not on file  Physical Activity: Not on file  Stress: Not on file  Social Connections: Not on file   Family History  Problem Relation Age of Onset   Hypertension Mother    Diabetes Mother    Drug abuse Mother    Hypertension Father    Hypertension Sister    Hypertension Sister    Colon cancer Neg Hx    Outpatient Encounter Medications as of 08/07/2021  Medication Sig   albuterol (VENTOLIN HFA) 108 (90  Base) MCG/ACT inhaler Inhale 2 puffs into the lungs every 6 (six) hours as needed for wheezing or shortness of breath.   ALPRAZolam (XANAX) 0.25 MG tablet Take 1 tablet (0.25 mg total) by mouth 2 (two) times daily as needed for anxiety.   [START ON 08/08/2021] ALPRAZolam (XANAX) 1 MG tablet Take 1 tablet (1 mg total) by mouth every evening.   amLODipine (NORVASC) 10 MG tablet Take 1 tablet (10 mg total) by mouth daily.   ARIPiprazole (ABILIFY) 2 MG tablet Take 1 tablet (2 mg total) by mouth at bedtime.   Cyanocobalamin (B-12) 2500 MCG TABS Take 1 tablet by mouth daily.   cyclobenzaprine (FLEXERIL) 10 MG tablet Take one tablet by mouth at bedtime , for neck spasm   ergocalciferol (VITAMIN D2) 1.25 MG (50000 UT) capsule Take 1 capsule (50,000 Units total) by mouth once a week. One capsule once weekly   [START ON 08/25/2021] escitalopram (LEXAPRO) 20 MG tablet Take 1 tablet (20 mg total) by mouth daily. Fill after 10/1  eszopiclone (LUNESTA) 1 MG TABS tablet Take 1 tablet (1 mg total) by mouth at bedtime as needed for sleep. Take immediately before bedtime   famotidine (PEPCID) 40 MG tablet Take 1 tablet (40 mg total) by mouth daily.   ibuprofen (ADVIL) 800 MG tablet Take 1 tablet (800 mg total) by mouth every 8 (eight) hours as needed.   ondansetron (ZOFRAN) 4 MG tablet TAKE 1 TABLET BY MOUTH EVERY 8 HOURS AS NEEDED FOR NAUSEA OR VOMITING   predniSONE (DELTASONE) 20 MG tablet Take one tablet by mouth 3 times daily for 2 days , then one tablet twice daily for 2 days, then one tablet once daily for 3 days, then one tablet every other day for 6 days, then stop   promethazine-dextromethorphan (PROMETHAZINE-DM) 6.25-15 MG/5ML syrup Take one teaspoon at bedtime , as needed, for excessive cough   rizatriptan (MAXALT) 10 MG tablet Take 1 tablet (10 mg total) by mouth as needed for migraine. May repeat in 2 hours if needed   spironolactone (ALDACTONE) 100 MG tablet Take 1 tablet (100 mg total) by mouth daily.    topiramate (TOPAMAX) 100 MG tablet Take 1 tablet (100 mg total) by mouth 2 (two) times daily.   No facility-administered encounter medications on file as of 08/07/2021.   ALLERGIES: Allergies  Allergen Reactions   Ketorolac Tromethamine Hives    Lips swelled    VACCINATION STATUS: Immunization History  Administered Date(s) Administered   Influenza Whole 08/24/2009, 07/25/2010, 09/28/2016   Influenza, High Dose Seasonal PF 11/18/2020   Influenza,inj,Quad PF,6+ Mos 07/09/2018, 07/19/2019   Janssen (J&J) SARS-COV-2 Vaccination 01/30/2020, 11/18/2020   Td 07/25/2010   Zoster Recombinat (Shingrix) 05/08/2021    HPI Olivia Woodward is 55 y.o. female who presents today with a medical history as above. 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  She is status post RAI thyroid ablation on June 19, 2021.  Her labs on August 02, 2021 were consistent with euthyroid state.   PMD: Fayrene Helper, MD.  She feels better, has no new complaints today.  She denies palpitations, tremors, nor heat intolerance. She denies any dysphagia, shortness of breath, nor voice change.  However, she feels dry mouth and dry throat.  Her repeat thyroid ultrasound showed decrease in the general size of her thyroid lobes bilaterally.  She did not have new findings.   She reports steady weight over the last 3 visits.   Review of Systems  Constitutional:  no fatigue, no subjective hyperthermia, no subjective hypothermia Eyes: no blurry vision, no xerophthalmia ENT: no sore throat, no nodules palpated in throat, no dysphagia/odynophagia, no hoarseness Cardiovascular: no Chest Pain, no Shortness of Breath, no palpitations, no leg swelling Respiratory: no cough, no shortness of breath Gastrointestinal: no Nausea/Vomiting/Diarhhea Musculoskeletal: no muscle/joint aches Skin: no  rashes Neurological: no tremors, no numbness, no tingling, no dizziness Psychiatric: no depression, no anxiety  Objective:    Vitals with BMI 08/07/2021 06/19/2021 06/11/2021  Height 5\' 4"  5\' 4"  5\' 4"   Weight 252 lbs 3 oz 253 lbs 254 lbs  BMI 43.27 13.24 40.10  Systolic 272 536 644  Diastolic 84 84 88  Pulse 76 98 88  Some encounter information is confidential and restricted. Go to Review Flowsheets activity to see all data.    BP 122/84   Pulse 76   Ht 5\' 4"  (1.626 m)   Wt 252 lb 3.2  oz (114.4 kg)   BMI 43.29 kg/m   Wt Readings from Last 3 Encounters:  08/07/21 252 lb 3.2 oz (114.4 kg)  06/19/21 253 lb (114.8 kg)  06/11/21 254 lb (115.2 kg)    Physical Exam  Constitutional:  Body mass index is 43.29 kg/m.,  not in acute distress, normal state of mind Eyes: PERRLA, EOMI, no exophthalmos ENT: moist mucous membranes, + decrease in the size of her gross thyromegaly, no gross cervical lymphadenopathy  Musculoskeletal: no gross deformities, strength intact in all four extremities Skin: moist, warm, no rashes Neurological: no tremor with outstretched hands, Deep tendon reflexes normal in bilateral lower extremities.  CMP ( most recent) CMP     Component Value Date/Time   NA 138 03/22/2021 0930   K 4.7 03/22/2021 0930   CL 103 03/22/2021 0930   CO2 17 (L) 03/22/2021 0930   GLUCOSE 99 03/22/2021 0930   GLUCOSE 91 10/04/2020 1618   BUN 13 03/22/2021 0930   CREATININE 1.02 (H) 03/22/2021 0930   CREATININE 0.71 07/10/2018 1015   CALCIUM 9.4 03/22/2021 0930   PROT 7.6 03/22/2021 0930   ALBUMIN 3.9 03/22/2021 0930   AST 13 03/22/2021 0930   ALT 6 03/22/2021 0930   ALKPHOS 59 03/22/2021 0930   BILITOT 0.3 03/22/2021 0930   GFRNONAA >60 10/04/2020 1618   GFRNONAA 100 07/10/2018 1015   GFRAA 87 09/06/2020 0843   GFRAA 116 07/10/2018 1015     Diabetic Labs (most recent): Lab Results  Component Value Date   HGBA1C 5.4 03/22/2021   HGBA1C 5.3 07/26/2010     Lipid  Panel ( most recent) Lipid Panel     Component Value Date/Time   CHOL 164 03/22/2021 0930   TRIG 73 03/22/2021 0930   HDL 69 03/22/2021 0930   CHOLHDL 2.4 03/22/2021 0930   CHOLHDL 2.0 12/27/2017 0949   VLDL 10 01/02/2017 1536   LDLCALC 81 03/22/2021 0930   LDLCALC 71 12/27/2017 0949   LABVLDL 14 03/22/2021 0930      Lab Results  Component Value Date   TSH 0.820 08/02/2021   TSH 0.409 (L) 03/22/2021   TSH 0.278 (L) 09/06/2020   TSH 0.279 (L) 09/06/2020   TSH 0.375 11/19/2018   TSH 0.339 (L) 10/26/2018   TSH 0.73 12/27/2017   TSH 0.58 01/02/2017   TSH 0.912 12/28/2008   FREET4 0.74 08/02/2021   FREET4 1.12 09/06/2020   FREET4 CANCELED 10/26/2018   FREET4 1.10 07/26/2010      Assessment & Plan:   1.  Toxic multinodular goiter 2. Subclinical hyperthyroidism-resolved She is status post RAI ablation of toxic thyroid nodule on the left lobe on June 19, 2021.  Her subsequent thyroid function test on August 02, 2021 were consistent with euthyroid state.  She did not develop hypothyroidism.  She is not ready for initiation of thyroid hormone replacement.  She has history of benign FNA from one of the right sided nodules in 2021.  Her most recent thyroid ultrasound confirmed shrinking in the size of her thyroid generally, and did not reveal any new concerning thyroid nodules.  She will not need surgical intervention at this time.   - she is advised to maintain close follow up with Fayrene Helper, MD for primary care needs.    I spent 25 minutes in the care of the patient today including review of labs from Thyroid Function, CMP, and other relevant labs ; imaging/biopsy records (current and previous including abstractions from other facilities); face-to-face time  discussing  her lab results and symptoms, medications doses, her options of short and long term treatment based on the latest standards of care / guidelines;   and documenting the encounter.  [place] Ouch Hou   participated in the discussions, expressed understanding, and voiced agreement with the above plans.  All questions were answered to her satisfaction. she is encouraged to contact clinic should she have any questions or concerns prior to her return visit.   Follow up plan: Return in about 8 weeks (around 10/02/2021) for F/U with Pre-visit Labs.   Glade Lloyd, MD [facility] [address] [place], [place] [address] Phone: [phone]  Fax: [phone]     08/07/2021, 5:03 PM  This note was partially dictated with voice recognition software. Similar sounding words can be transcribed inadequately or may not  be corrected upon review.

## 2021-08-16 ENCOUNTER — Other Ambulatory Visit: Payer: Self-pay

## 2021-08-16 ENCOUNTER — Ambulatory Visit (INDEPENDENT_AMBULATORY_CARE_PROVIDER_SITE_OTHER): Payer: 59 | Admitting: Licensed Clinical Social Worker

## 2021-08-16 DIAGNOSIS — F331 Major depressive disorder, recurrent, moderate: Secondary | ICD-10-CM

## 2021-08-17 NOTE — BH Specialist Note (Addendum)
[place] Follow Up Assessment  MRN: [id number] NAME: Olivia Woodward Date: 08/17/21  Start time: 1210 End time: 1235 Total time: 20  Type of Contact: Follow up Call  Current concerns/stressors: continued depressive symptoms  Screens/Assessment Tools: GAD 7 : Generalized Anxiety Score 08/17/2021 06/26/2021 06/13/2021 05/04/2021  Nervous, Anxious, on Edge 2 2 2 2   Control/stop worrying 2 2 2 2   Worry too much - different things 2 2 2 2   Trouble relaxing 2 2 2 2   Restless 2 2 2 1   Easily annoyed or irritable 2 2 2 2   Afraid - awful might happen 1 2 1 1   Total GAD 7 Score 13 14 13 12   Anxiety Difficulty Very difficult Somewhat difficult Very difficult Very difficult     PHQ9 SCORE ONLY 08/17/2021 06/26/2021 06/19/2021  PHQ-9 Total Score 21 15 18     Functional Assessment:  Sleep: poor Appetite: poor Coping ability: overwhelmed Patient taking medications as prescribed:  yes  Current medications:  Outpatient Encounter Medications as of 08/16/2021  Medication Sig   albuterol (VENTOLIN HFA) 108 (90 Base) MCG/ACT inhaler Inhale 2 puffs into the lungs every 6 (six) hours as needed for wheezing or shortness of breath.   ALPRAZolam (XANAX) 0.25 MG tablet Take 1 tablet (0.25 mg total) by mouth 2 (two) times daily as needed for anxiety.   ALPRAZolam (XANAX) 1 MG tablet Take 1 tablet (1 mg total) by mouth every evening.   amLODipine (NORVASC) 10 MG tablet Take 1 tablet (10 mg total) by mouth daily.   ARIPiprazole (ABILIFY) 2 MG tablet Take 1 tablet (2 mg total) by mouth at bedtime.   Cyanocobalamin (B-12) 2500 MCG TABS Take 1 tablet by mouth daily.   cyclobenzaprine (FLEXERIL) 10 MG tablet Take one tablet by mouth at bedtime , for neck spasm   ergocalciferol (VITAMIN D2) 1.25 MG (50000 UT) capsule Take 1 capsule (50,000 Units total) by mouth once a week. One capsule once weekly   [START ON 08/25/2021] escitalopram (LEXAPRO) 20 MG tablet Take 1 tablet (20 mg total) by mouth daily. Fill  after 10/1   eszopiclone (LUNESTA) 1 MG TABS tablet Take 1 tablet (1 mg total) by mouth at bedtime as needed for sleep. Take immediately before bedtime   famotidine (PEPCID) 40 MG tablet Take 1 tablet (40 mg total) by mouth daily.   ibuprofen (ADVIL) 800 MG tablet Take 1 tablet (800 mg total) by mouth every 8 (eight) hours as needed.   ondansetron (ZOFRAN) 4 MG tablet TAKE 1 TABLET BY MOUTH EVERY 8 HOURS AS NEEDED FOR NAUSEA OR VOMITING   predniSONE (DELTASONE) 20 MG tablet Take one tablet by mouth 3 times daily for 2 days , then one tablet twice daily for 2 days, then one tablet once daily for 3 days, then one tablet every other day for 6 days, then stop   promethazine-dextromethorphan (PROMETHAZINE-DM) 6.25-15 MG/5ML syrup Take one teaspoon at bedtime , as needed, for excessive cough   rizatriptan (MAXALT) 10 MG tablet Take 1 tablet (10 mg total) by mouth as needed for migraine. May repeat in 2 hours if needed   spironolactone (ALDACTONE) 100 MG tablet Take 1 tablet (100 mg total) by mouth daily.   topiramate (TOPAMAX) 100 MG tablet Take 1 tablet (100 mg total) by mouth 2 (two) times daily.   No facility-administered encounter medications on file as of 08/16/2021.    Self-harm and/or Suicidal Behaviors Risk Assessment Self-harm risk factors: no Patient endorses recent self injurious thoughts  and/or behaviors: No   Suicide ideations: No plan to harm self or others   Danger to Others Risk Assessment Danger to others risk factors: no Patient endorses recent thoughts of harming others: No    Substance Use Assessment Patient recently consumed alcohol: No  Patient recently used drugs: No  Patient is concerned about dependence or abuse of substances: No    Goals, Interventions and Follow-up Plan Goals: Increase healthy adjustment to current life circumstances Interventions: Behavioral Activation   Summary of Clinical Assessment  Olivia Woodward is a 55 yr old woman that was referred by her  PCP.  Olivia Woodward continues to have symptoms such as: poor motivation, lack of energy, forgetfulness, increased sleep and sadness.  Olivia Woodward continues to be on short term disability and IT sales professional to complete forms for her.  Writer will complete.  Factors that contribute to client's ongoing depressive symptoms were discussed and include real and perceived feelings of isolation, criticism, rejection, shame and guilt.  Olivia Woodward will benefit from [facility]   Follow-up Plan: Refer to [facility]ensive [facility] and continue weekly VBH sessions Lubertha South, LCSW

## 2021-08-19 NOTE — BH Specialist Note (Addendum)
[facility] Follow Up Assessment  MRN: [id number] NAME: Olivia Woodward Date: 07/07/21  Start time: 2 End time: 230 Total time: 30  Type of Contact: Follow up Call  Current concerns/stressors: depressive symptoms  Functional Assessment:  Sleep: poor Appetite: poor Coping ability: exhausted Patient taking medications as prescribed:    Current medications:  Outpatient Encounter Medications as of 07/05/2021  Medication Sig   albuterol (VENTOLIN HFA) 108 (90 Base) MCG/ACT inhaler Inhale 2 puffs into the lungs every 6 (six) hours as needed for wheezing or shortness of breath.   ALPRAZolam (XANAX) 0.25 MG tablet Take 1 tablet (0.25 mg total) by mouth 2 (two) times daily as needed for anxiety.   amLODipine (NORVASC) 10 MG tablet Take 1 tablet (10 mg total) by mouth daily.   Cyanocobalamin (B-12) 2500 MCG TABS Take 1 tablet by mouth daily.   cyclobenzaprine (FLEXERIL) 10 MG tablet Take one tablet by mouth at bedtime , for neck spasm   ergocalciferol (VITAMIN D2) 1.25 MG (50000 UT) capsule Take 1 capsule (50,000 Units total) by mouth once a week. One capsule once weekly   famotidine (PEPCID) 40 MG tablet Take 1 tablet (40 mg total) by mouth daily.   ibuprofen (ADVIL) 800 MG tablet Take 1 tablet (800 mg total) by mouth every 8 (eight) hours as needed.   ondansetron (ZOFRAN) 4 MG tablet TAKE 1 TABLET BY MOUTH EVERY 8 HOURS AS NEEDED FOR NAUSEA OR VOMITING   predniSONE (DELTASONE) 20 MG tablet Take one tablet by mouth 3 times daily for 2 days , then one tablet twice daily for 2 days, then one tablet once daily for 3 days, then one tablet every other day for 6 days, then stop   promethazine-dextromethorphan (PROMETHAZINE-DM) 6.25-15 MG/5ML syrup Take one teaspoon at bedtime , as needed, for excessive cough   rizatriptan (MAXALT) 10 MG tablet Take 1 tablet (10 mg total) by mouth as needed for migraine. May repeat in 2 hours if needed   spironolactone (ALDACTONE) 100 MG tablet Take 1  tablet (100 mg total) by mouth daily.   topiramate (TOPAMAX) 100 MG tablet Take 1 tablet (100 mg total) by mouth 2 (two) times daily.   [DISCONTINUED] ALPRAZolam (XANAX) 1 MG tablet Take 1 tablet (1 mg total) by mouth every evening.   [DISCONTINUED] buPROPion (WELLBUTRIN XL) 150 MG 24 hr tablet Take 1 tablet (150 mg total) by mouth daily.   [DISCONTINUED] escitalopram (LEXAPRO) 20 MG tablet 10 mg daily for one week, then 20 mg daily   [DISCONTINUED] eszopiclone (LUNESTA) 1 MG TABS tablet Take 1 tablet (1 mg total) by mouth at bedtime as needed for sleep. Take immediately before bedtime   No facility-administered encounter medications on file as of 07/05/2021.    Self-harm and/or Suicidal Behaviors Risk Assessment Self-harm risk factors: no Patient endorses recent self injurious thoughts and/or behaviors: No   Suicide ideations: No plan to harm self or others   Danger to Others Risk Assessment Danger to others risk factors: no Patient endorses recent thoughts of harming others: No    Substance Use Assessment Patient recently consumed alcohol: No  Patient recently used drugs: No  Patient is concerned about dependence or abuse of substances: No    Goals, Interventions and Follow-up Plan Goals: Increase healthy adjustment to current life circumstances Interventions: Mindfulness or Relaxation Training, Behavioral Activation, and CBT Cognitive Behavioral Therapy   Summary of Clinical Assessment  Olivia Woodward is a 55 yr old woman present for follow up.  Olivia Woodward  reports difficulty with her depressive symptoms such as: sadness, poor motivation, poor energy, anhedonia and forgetfulness.  Olivia Woodward has been writing important information given by her children to avoid forgetting what her daughter may have told her as it has happened in the past.  Olivia Woodward continues to have significant symptoms with a high intensity and frequency.  Provided psycho education on depression and behavioral activation.  Provided  assignment to "do one ting different everyday and to track her mood."    Follow-up Plan:  weekly [place], LCSW

## 2021-08-22 ENCOUNTER — Telehealth: Payer: Self-pay | Admitting: Licensed Clinical Social Worker

## 2021-08-22 DIAGNOSIS — F331 Major depressive disorder, recurrent, moderate: Secondary | ICD-10-CM

## 2021-08-22 NOTE — BH Specialist Note (Signed)
Virtual Behavioral [facility] Treatment Plan Team Note  MRN: [id number] NAME: Olivia Woodward  DATE: 08/22/21  Start time:   4pEnd time:  408p Total time:  8 min  Total number of Virtual [place] Treatment Team Plan encounters: 4/4  Treatment Team Attendees: [name], LCSW & [name]  Diagnoses: No diagnosis found.  Goals, Interventions and Follow-up Plan Goals: Increase healthy adjustment to current life circumstances Interventions: Behavioral Activation Medication Management Recommendations: continue with current medication regimen Follow-up Plan: Refer to [facility] Intensive [facility] Program continue weekly VBH sessions  History of the present illness Presenting Problem/Current Symptoms: continued symptoms of her DX  Psychiatric History  Depression: Yes Anxiety: No Mania: No Psychosis: No PTSD symptoms: No  Past Psychiatric History/Hospitalization(s): Hospitalization for psychiatric illness: No Prior Suicide Attempts: No Prior Self-injurious behavior: No  Psychosocial stressors Flowsheet Row Virtual [place] Phone Follow Up from 08/04/2018 in [facility]  Current Stressors Family death  [Medication is not helping her level of depression and anxiety. ]  Familial Stressors None  Sleep Decreased, Difficulty falling asleep, Difficulty staying asleep       Self-harm Behaviors Risk Assessment Flowsheet Row Virtual BH Phone Follow Up from 07/16/2018 in [facility]  Self-harm risk factors --  [None Reported]  Have you recently had any thoughts about harming yourself? No       Screenings PHQ-9 Assessments:  Depression screen [facility] 2/9 08/17/2021 06/26/2021 06/19/2021  Decreased Interest 3 3 3   Down, Depressed, Hopeless 3 3 3   PHQ - 2 Score 6 6 6   Altered sleeping 3 2 3   Tired, decreased energy 3 2 3   Change in appetite 2 1 3   Feeling bad or failure about yourself  2 2 0  Trouble concentrating 3 1 3   Moving slowly or fidgety/restless 2 1 0  Suicidal  thoughts 0 0 0  PHQ-9 Score 21 15 18   Difficult doing work/chores Extremely dIfficult Very difficult -  Some recent data might be hidden   GAD-7 Assessments:  GAD 7 : Generalized Anxiety Score 08/17/2021 06/26/2021 06/13/2021 05/04/2021  Nervous, Anxious, on Edge 2 2 2 2   Control/stop worrying 2 2 2 2   Worry too much - different things 2 2 2 2   Trouble relaxing 2 2 2 2   Restless 2 2 2 1   Easily annoyed or irritable 2 2 2 2   Afraid - awful might happen 1 2 1 1   Total GAD 7 Score 13 14 13 12   Anxiety Difficulty Very difficult Somewhat difficult Very difficult Very difficult    Past Medical History Past Medical History:  Diagnosis Date   Anemia    Anxiety    Phreesia 03/18/2021   Depression    Phreesia 03/18/2021   Generalized headaches    Hypertension     Vital signs: There were no vitals filed for this visit.  Allergies:  Allergies as of 08/22/2021 - Review Complete 08/07/2021  Allergen Reaction Noted   Ketorolac tromethamine Hives 03/16/2009    Medication History Current medications:  Outpatient Encounter Medications as of 08/22/2021  Medication Sig   albuterol (VENTOLIN HFA) 108 (90 Base) MCG/ACT inhaler Inhale 2 puffs into the lungs every 6 (six) hours as needed for wheezing or shortness of breath.   ALPRAZolam (XANAX) 0.25 MG tablet Take 1 tablet (0.25 mg total) by mouth 2 (two) times daily as needed for anxiety.   ALPRAZolam (XANAX) 1 MG tablet Take 1 tablet (1 mg total) by mouth every evening.   amLODipine (NORVASC) 10 MG  tablet Take 1 tablet (10 mg total) by mouth daily.   ARIPiprazole (ABILIFY) 2 MG tablet Take 1 tablet (2 mg total) by mouth at bedtime.   Cyanocobalamin (B-12) 2500 MCG TABS Take 1 tablet by mouth daily.   cyclobenzaprine (FLEXERIL) 10 MG tablet Take one tablet by mouth at bedtime , for neck spasm   ergocalciferol (VITAMIN D2) 1.25 MG (50000 UT) capsule Take 1 capsule (50,000 Units total) by mouth once a week. One capsule once weekly   [START ON  08/25/2021] escitalopram (LEXAPRO) 20 MG tablet Take 1 tablet (20 mg total) by mouth daily. Fill after 10/1   eszopiclone (LUNESTA) 1 MG TABS tablet Take 1 tablet (1 mg total) by mouth at bedtime as needed for sleep. Take immediately before bedtime   famotidine (PEPCID) 40 MG tablet Take 1 tablet (40 mg total) by mouth daily.   ibuprofen (ADVIL) 800 MG tablet Take 1 tablet (800 mg total) by mouth every 8 (eight) hours as needed.   ondansetron (ZOFRAN) 4 MG tablet TAKE 1 TABLET BY MOUTH EVERY 8 HOURS AS NEEDED FOR NAUSEA OR VOMITING   predniSONE (DELTASONE) 20 MG tablet Take one tablet by mouth 3 times daily for 2 days , then one tablet twice daily for 2 days, then one tablet once daily for 3 days, then one tablet every other day for 6 days, then stop   promethazine-dextromethorphan (PROMETHAZINE-DM) 6.25-15 MG/5ML syrup Take one teaspoon at bedtime , as needed, for excessive cough   rizatriptan (MAXALT) 10 MG tablet Take 1 tablet (10 mg total) by mouth as needed for migraine. May repeat in 2 hours if needed   spironolactone (ALDACTONE) 100 MG tablet Take 1 tablet (100 mg total) by mouth daily.   topiramate (TOPAMAX) 100 MG tablet Take 1 tablet (100 mg total) by mouth 2 (two) times daily.   No facility-administered encounter medications on file as of 08/22/2021.     Scribe for Treatment Team: Lubertha South, LCSW

## 2021-08-27 NOTE — Progress Notes (Signed)
Virtual Visit via Video Note  I connected with Olivia Woodward on 08/31/21 at  9:30 AM EDT by a video enabled telemedicine application and verified that I am speaking with the correct person using two identifiers.  Location: Patient: home Provider: office Persons participated in the visit- patient, provider    I discussed the limitations of evaluation and management by telemedicine and the availability of in person appointments. The patient expressed understanding and agreed to proceed.     I discussed the assessment and treatment plan with the patient. The patient was provided an opportunity to ask questions and all were answered. The patient agreed with the plan and demonstrated an understanding of the instructions.   The patient was advised to call back or seek an in-person evaluation if the symptoms worsen or if the condition fails to improve as anticipated.  I provided 26 minutes of non-face-to-face time during this encounter.   Norman Clay, MD    [facility] MD/PA/NP OP Progress Note  08/31/2021 10:04 AM Olivia Woodward  MRN:  [id number]  Chief Complaint:  Chief Complaint   Follow-up; Depression    HPI:  This is a follow-up appointment for depression and anxiety.  She states that she is having headache, dizziness, nausea, blurry vision and constipation since the last visit.  She believes it is coming from [place].  However, she states that she did not experience this prior to the last visit.  She thought Lexapro was started at the last visit.  After reviewing the medication, she states that her memory is not good, and she is unsure which medication is causing the symptoms.  She has been going outside more frequently.  She had a breakdown at [employer].  She was stressed when people were frustrated as there was no casher.  She has been going to church 3 times a week.  She has been trying the best for her child.  She talks about the conflict with her children, including her daughter.   She had to call the police the other night due to her daughter's behavior.  She was banging on things when she was taking away her phone due to her grade.  She feels overwhelmed with these situations.  She also talks about loss of her aunt, who was very helpful in taking care of her children.  She has initial and middle insomnia.  She feels depressed and fatigue.  She has difficulty in concentration.  She has mild appetite loss.  She feels anxious sometimes.  She denies SI.  She wants to discontinue Xanax as she does not want to be dependent on the medication.  She agrees with the plan as below.    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 (81 year old daughter, and 38 yo son, going to college in 2022) Education: graduated from [employer], attended [employer]   Visit Diagnosis:    ICD-10-CM   1. Moderate episode of recurrent major depressive disorder (HCC)  F33.1     2. Anxiety  F41.9     3. Insomnia, unspecified type  G47.00       Past Psychiatric History: Please see initial evaluation for full details. I have reviewed the  history. No updates at this time.     Past Medical History:  Past Medical History:  Diagnosis Date   Anemia    Anxiety    Phreesia 03/18/2021   Depression    Phreesia 03/18/2021   Generalized headaches    Hypertension     Past Surgical History:  Procedure Laterality Date   ABDOMINAL HYSTERECTOMY     fibroids, partial   BREAST REDUCTION SURGERY  2005   COLONOSCOPY WITH PROPOFOL N/A 05/10/2019   Procedure: COLONOSCOPY WITH PROPOFOL;  Surgeon: Daneil Dolin, MD;  [facility]: [facility];  Service: Endoscopy;  Laterality: N/A;  2:45pm    GASTRIC BYPASS  2007   POLYPECTOMY  05/10/2019   Procedure: POLYPECTOMY;  Surgeon: Daneil Dolin, MD;  Location: [facility];  Service: Endoscopy;;  colon    Family Psychiatric History: Please see initial evaluation for full details. I have reviewed the history. No updates at this time.     Family History:  Family History  Problem Relation Age of Onset   Hypertension Mother    Diabetes Mother    Drug abuse Mother    Hypertension Father    Hypertension Sister    Hypertension Sister    Colon cancer Neg Hx     Social History:  Social History   Socioeconomic History   Marital status: Single    Spouse name: Not on file   Number of children: Not on file   Years of education: Not on file   Highest education level: Not on file  Occupational History   Not on file  Tobacco Use   Smoking status: Never   Smokeless tobacco: Never  Substance and Sexual Activity   Alcohol use: No   Drug use: No   Sexual activity: Yes    Birth control/protection: Condom  Other Topics Concern   Not on file  Social History Narrative   Not on file   Social Determinants of Health   Financial Resource Strain: Not on file  Food Insecurity: Not on file  Transportation Needs: Not on file  Physical Activity: Not on file  Stress: Not on file  Social Connections: Not on file    Allergies:  Allergies  Allergen Reactions   Ketorolac Tromethamine Hives    Lips swelled    Metabolic Disorder Labs: Lab Results  Component Value Date   HGBA1C 5.4 03/22/2021   No results found for: PROLACTIN Lab Results  Component Value Date   CHOL 164 03/22/2021   TRIG 73 03/22/2021   HDL 69 03/22/2021   CHOLHDL 2.4 03/22/2021   VLDL 10 01/02/2017   LDLCALC 81 03/22/2021   LDLCALC 124 (H) 09/06/2020   Lab Results  Component Value Date   TSH 0.820 08/02/2021   TSH 0.409 (L) 03/22/2021    Therapeutic Level Labs: No results found for: LITHIUM No results found for: VALPROATE No components found for:   CBMZ  Current Medications: Current Outpatient Medications  Medication Sig Dispense Refill   Brexpiprazole (REXULTI) 0.5 MG TABS Take 1 tablet (0.5 mg total) by mouth daily. 30 tablet 0   albuterol (VENTOLIN HFA) 108 (90 Base) MCG/ACT inhaler Inhale 2 puffs into the lungs every 6 (six) hours as needed for wheezing or shortness of breath. 18 g 0   ALPRAZolam (XANAX) 0.25 MG tablet Take 1 tablet (0.25 mg total) by mouth 2 (two) times daily as needed for anxiety. 60 tablet 1   amLODipine (NORVASC) 10 MG tablet Take 1 tablet (10 mg total)  by mouth daily. 90 tablet 3   Cyanocobalamin (B-12) 2500 MCG TABS Take 1 tablet by mouth daily.     cyclobenzaprine (FLEXERIL) 10 MG tablet Take one tablet by mouth at bedtime , for neck spasm 30 tablet 0   ergocalciferol (VITAMIN D2) 1.25 MG (50000 UT) capsule Take 1 capsule (50,000 Units total) by mouth once a week. One capsule once weekly 12 capsule 2   escitalopram (LEXAPRO) 20 MG tablet Take 1 tablet (20 mg total) by mouth daily. Fill after 10/1 30 tablet 2   eszopiclone (LUNESTA) 2 MG TABS tablet Take 1 tablet (2 mg total) by mouth at bedtime as needed for sleep. Take immediately before bedtime 30 tablet 0   famotidine (PEPCID) 40 MG tablet Take 1 tablet (40 mg total) by mouth daily. 30 tablet 0   ibuprofen (ADVIL) 800 MG tablet Take 1 tablet (800 mg total) by mouth every 8 (eight) hours as needed. 30 tablet 0   ondansetron (ZOFRAN) 4 MG tablet TAKE 1 TABLET BY MOUTH EVERY 8 HOURS AS NEEDED FOR NAUSEA OR VOMITING 20 tablet 0   predniSONE (DELTASONE) 20 MG tablet Take one tablet by mouth 3 times daily for 2 days , then one tablet twice daily for 2 days, then one tablet once daily for 3 days, then one tablet every other day for 6 days, then stop 16 tablet 0   promethazine-dextromethorphan (PROMETHAZINE-DM) 6.25-15 MG/5ML syrup Take one teaspoon at bedtime , as needed, for excessive cough 180 mL 0   rizatriptan (MAXALT) 10 MG tablet Take 1 tablet (10 mg total) by  mouth as needed for migraine. May repeat in 2 hours if needed 10 tablet 0   spironolactone (ALDACTONE) 100 MG tablet Take 1 tablet (100 mg total) by mouth daily. 30 tablet 3   topiramate (TOPAMAX) 100 MG tablet Take 1 tablet (100 mg total) by mouth 2 (two) times daily. 60 tablet 3   No current facility-administered medications for this visit.     Musculoskeletal: Strength & Muscle Tone:  N/A Gait & Station:  N/A Patient leans: N/A  Psychiatric Specialty Exam: Review of Systems  Psychiatric/Behavioral:  Positive for decreased concentration, dysphoric mood and sleep disturbance. Negative for agitation, behavioral problems, confusion, hallucinations, self-injury and suicidal ideas. The patient is nervous/anxious. The patient is not hyperactive.   All other systems reviewed and are negative.  There were no vitals taken for this visit.There is no height or weight on file to calculate BMI.  General Appearance: Fairly Groomed  Eye Contact:  Fair  Speech:  Clear and Coherent  Volume:  Normal  Mood:  Depressed  Affect:  Appropriate, Congruent, and Restricted  Thought Process:  Coherent  Orientation:  Full (Time, Place, and Person)  Thought Content: Logical   Suicidal Thoughts:  No  Homicidal Thoughts:  No  Memory:  Immediate;   Good  Judgement:  Good  Insight:  Good  Psychomotor Activity:  Normal  Concentration:  Concentration: Good and Attention Span: Good  Recall:  Good  Fund of Knowledge: Good  Language: Good  Akathisia:  No  Handed:  Right  AIMS (if indicated): not done  Assets:  Communication Skills Desire for Improvement  ADL's:  Intact  Cognition: WNL  Sleep:  Poor   Screenings: GAD-7    Flowsheet Row Office Visit from 08/16/2021 in [place] Phone Follow Up from 06/26/2021 in [facility] Video Visit from 06/12/2021 in [facility] Video Visit from 05/04/2021 in [place]  [place] Office Visit from  03/17/2019 in [facility]  Total GAD-7 Score 13 14 13 12 5       PHQ2-9    Flowsheet Row Office Visit from 08/29/2021 in [facility] Office Visit from 08/16/2021 in [facility] Virtual [facility] Phone Follow Up from 06/26/2021 in [place] Visit from 06/19/2021 in [facility] Video Visit from 06/12/2021 in [facility]  PHQ-2 Total Score 6 6 6 6 6   PHQ-9 Total Score 19 21 15 18 15       Flowsheet Row Video Visit from 08/31/2021 in [place] Video Visit from 07/27/2021 in [place] Office Visit from 06/19/2021 in [facility] Error: Q3, 4, or 5 should not be populated when Q2 is No Error: Question 6 not populated Error: Q3, 4, or 5 should not be populated when Q2 is No        Assessment and Plan:  Olivia Woodward is a 55 y.o. year old female with a history of depression, iron deficiency anemia,  multinodular goiter, subclinical hyperthyroidism, vitamin D deficiency, hypertension, s/p gastric bypass surgery in 2017, who presents for follow up appointment for below.    1. Moderate episode of recurrent major depressive disorder ([place]) 2. Anxiety Although there has been overall improvement in depressive symptoms, she has adverse reaction of tinnitus, nausea, constipation since starting Abilify.  Psychosocial stressors includes conflict with her children, and the recent loss of her aunt.  Will discontinue Abilify to see if her side effects subsides.  She is advised to start rexulti adjunctive treatment for depression after her symptoms are resolved.  Discussed potential metabolic side effect and EPS.  Will continue Lexapro to target depression and anxiety.  We will lower the dose of Xanax given patient request.  She will continue to see a therapist through [facility].   # Insomnia She continues to have initial insomnia.  She has snoring and fatigue.   She has an upcoming appointment for sleep evaluation.  We uptitrate the dose of Lunesta to target insomnia.   This clinician has discussed the side effect associated with medication prescribed during this encounter. Please refer to notes in the previous encounters for more details.     Plan Continue lexapro 20 mg daily Discontinue Abilify  Start rexulti 0.5 mg daily after nausea, constipation, and blurry vision resolve Increase Lunesta 2 mg at night as needed for sleep Decrease Xanax 0.25 mg in AM (discontinue 1 mg at night ) Next appointment: 11/2 at 11:30 for 30 mins video   I would support that she would remain out from work with expected return date on December 9 th.  She has depressive symptoms with significant impairment in cognition/memory and fatigue, which interferes with her ability to work at her capacity.      Past trials of medication: sertraline fluoxetine, lexapro, venlafaxine ("funny"), bupropion (dry mouth, nausea), quetiapine (drowsiness), Abilify (nausea, constipation, tinnitus), Xanax, temazepam, Trazodone, Ambien, Belsomra (could not afford)   I have reviewed suicide assessment in detail. No change in the following assessment.    The patient demonstrates the following risk factors for suicide: Chronic risk factors for suicide include: psychiatric disorder of depression. Acute risk factors for suicide include: loss (financial, interpersonal, professional). Protective factors for this patient include: responsibility to others (children, family), coping skills, hope for the future and religious beliefs against suicide. Considering these factors, the overall suicide risk at this point appears  to be low. Patient is appropriate for outpatient follow up.    Norman Clay, MD 08/31/2021, 10:04 AM

## 2021-08-29 ENCOUNTER — Other Ambulatory Visit: Payer: Self-pay

## 2021-08-29 ENCOUNTER — Ambulatory Visit (INDEPENDENT_AMBULATORY_CARE_PROVIDER_SITE_OTHER): Payer: 59 | Admitting: Licensed Clinical Social Worker

## 2021-08-29 ENCOUNTER — Telehealth: Payer: Self-pay | Admitting: Licensed Clinical Social Worker

## 2021-08-29 DIAGNOSIS — F331 Major depressive disorder, recurrent, moderate: Secondary | ICD-10-CM

## 2021-08-29 NOTE — BH Specialist Note (Signed)
Virtual Behavioral Health Treatment Plan Team Note  MRN: [id number] NAME: Olivia Woodward  DATE: 08/29/21  Start time:  306 End time:  311 Total time:  5min  Total number of Virtual [place] Treatment Team Plan encounters:  5  Treatment Team Attendees: [name], LCSW & [name]  Diagnoses: No diagnosis found.  Goals, Interventions and Follow-up Plan Goals: Increase healthy adjustment to current life circumstances Interventions: biweekly VBH sessions Medication Management Recommendations: no changing in medication Follow-up Plan: VBH session  History of the present illness Presenting Problem/Current Symptoms: symptoms of depression  Psychiatric History  Depression: Yes Anxiety: No Mania: No Psychosis: No PTSD symptoms: No  Past Psychiatric History/Hospitalization(s): Hospitalization for psychiatric illness: No Prior Suicide Attempts: No Prior Self-injurious behavior: No  Psychosocial stressors Flowsheet Row Virtual BH Phone Follow Up from 08/04/2018 in [facility]  Current Stressors Family death  [Medication is not helping her level of depression and anxiety. ]  Familial Stressors None  Sleep Decreased, Difficulty falling asleep, Difficulty staying asleep       Self-harm Behaviors Risk Assessment Flowsheet Row Virtual BH Phone Follow Up from 07/16/2018 in [facility]  Self-harm risk factors --  [None Reported]  Have you recently had any thoughts about harming yourself? No       Screenings PHQ-9 Assessments:  Depression screen [facility] 2/9 08/29/2021 08/17/2021 06/26/2021  Decreased Interest 3 3 3   Down, Depressed, Hopeless 3 3 3   PHQ - 2 Score 6 6 6   Altered sleeping 3 3 2   Tired, decreased energy 2 3 2   Change in appetite 2 2 1   Feeling bad or failure about yourself  2 2 2   Trouble concentrating 2 3 1   Moving slowly or fidgety/restless 2 2 1   Suicidal thoughts 0 0 0  PHQ-9 Score 19 21 15   Difficult doing work/chores Extremely  dIfficult Extremely dIfficult Very difficult  Some recent data might be hidden   GAD-7 Assessments:  GAD 7 : Generalized Anxiety Score 08/17/2021 06/26/2021 06/13/2021 05/04/2021  Nervous, Anxious, on Edge 2 2 2 2   Control/stop worrying 2 2 2 2   Worry too much - different things 2 2 2 2   Trouble relaxing 2 2 2 2   Restless 2 2 2 1   Easily annoyed or irritable 2 2 2 2   Afraid - awful might happen 1 2 1 1   Total GAD 7 Score 13 14 13 12   Anxiety Difficulty Very difficult Somewhat difficult Very difficult Very difficult    Past Medical History Past Medical History:  Diagnosis Date   Anemia    Anxiety    Phreesia 03/18/2021   Depression    Phreesia 03/18/2021   Generalized headaches    Hypertension     Vital signs: There were no vitals filed for this visit.  Allergies:  Allergies as of 08/29/2021 - Review Complete 08/07/2021  Allergen Reaction Noted   Ketorolac tromethamine Hives 03/16/2009    Medication History Current medications:  Outpatient Encounter Medications as of 08/29/2021  Medication Sig   albuterol (VENTOLIN HFA) 108 (90 Base) MCG/ACT inhaler Inhale 2 puffs into the lungs every 6 (six) hours as needed for wheezing or shortness of breath.   ALPRAZolam (XANAX) 0.25 MG tablet Take 1 tablet (0.25 mg total) by mouth 2 (two) times daily as needed for anxiety.   ALPRAZolam (XANAX) 1 MG tablet Take 1 tablet (1 mg total) by mouth every evening.   amLODipine (NORVASC) 10 MG tablet Take 1 tablet (10 mg total) by mouth  daily.   ARIPiprazole (ABILIFY) 2 MG tablet Take 1 tablet (2 mg total) by mouth at bedtime.   Cyanocobalamin (B-12) 2500 MCG TABS Take 1 tablet by mouth daily.   cyclobenzaprine (FLEXERIL) 10 MG tablet Take one tablet by mouth at bedtime , for neck spasm   ergocalciferol (VITAMIN D2) 1.25 MG (50000 UT) capsule Take 1 capsule (50,000 Units total) by mouth once a week. One capsule once weekly   escitalopram (LEXAPRO) 20 MG tablet Take 1 tablet (20 mg total) by mouth  daily. Fill after 10/1   eszopiclone (LUNESTA) 1 MG TABS tablet Take 1 tablet (1 mg total) by mouth at bedtime as needed for sleep. Take immediately before bedtime   famotidine (PEPCID) 40 MG tablet Take 1 tablet (40 mg total) by mouth daily.   ibuprofen (ADVIL) 800 MG tablet Take 1 tablet (800 mg total) by mouth every 8 (eight) hours as needed.   ondansetron (ZOFRAN) 4 MG tablet TAKE 1 TABLET BY MOUTH EVERY 8 HOURS AS NEEDED FOR NAUSEA OR VOMITING   predniSONE (DELTASONE) 20 MG tablet Take one tablet by mouth 3 times daily for 2 days , then one tablet twice daily for 2 days, then one tablet once daily for 3 days, then one tablet every other day for 6 days, then stop   promethazine-dextromethorphan (PROMETHAZINE-DM) 6.25-15 MG/5ML syrup Take one teaspoon at bedtime , as needed, for excessive cough   rizatriptan (MAXALT) 10 MG tablet Take 1 tablet (10 mg total) by mouth as needed for migraine. May repeat in 2 hours if needed   spironolactone (ALDACTONE) 100 MG tablet Take 1 tablet (100 mg total) by mouth daily.   topiramate (TOPAMAX) 100 MG tablet Take 1 tablet (100 mg total) by mouth 2 (two) times daily.   No facility-administered encounter medications on file as of 08/29/2021.     Scribe for Treatment Team: Lubertha South, LCSW

## 2021-08-29 NOTE — BH Specialist Note (Signed)
[facility] Follow Up Assessment  MRN: [id number] NAME: Olivia Woodward Date: 08/29/21  Start time: 2 End time: 12 Total time: 30  Type of Contact: Follow up Call  Current concerns/stressors: short term disability; continued depression symptoms  Screens/Assessment Tools: PHQ9 SCORE ONLY 08/29/2021 08/17/2021 06/26/2021  PHQ-9 Total Score 19 21 15       Functional Assessment:  Sleep: poor Appetite: poor Coping ability: overwhelmed Patient taking medications as prescribed:  yes  Current medications:  Outpatient Encounter Medications as of 08/29/2021  Medication Sig   albuterol (VENTOLIN HFA) 108 (90 Base) MCG/ACT inhaler Inhale 2 puffs into the lungs every 6 (six) hours as needed for wheezing or shortness of breath.   ALPRAZolam (XANAX) 0.25 MG tablet Take 1 tablet (0.25 mg total) by mouth 2 (two) times daily as needed for anxiety.   ALPRAZolam (XANAX) 1 MG tablet Take 1 tablet (1 mg total) by mouth every evening.   amLODipine (NORVASC) 10 MG tablet Take 1 tablet (10 mg total) by mouth daily.   ARIPiprazole (ABILIFY) 2 MG tablet Take 1 tablet (2 mg total) by mouth at bedtime.   Cyanocobalamin (B-12) 2500 MCG TABS Take 1 tablet by mouth daily.   cyclobenzaprine (FLEXERIL) 10 MG tablet Take one tablet by mouth at bedtime , for neck spasm   ergocalciferol (VITAMIN D2) 1.25 MG (50000 UT) capsule Take 1 capsule (50,000 Units total) by mouth once a week. One capsule once weekly   escitalopram (LEXAPRO) 20 MG tablet Take 1 tablet (20 mg total) by mouth daily. Fill after 10/1   eszopiclone (LUNESTA) 1 MG TABS tablet Take 1 tablet (1 mg total) by mouth at bedtime as needed for sleep. Take immediately before bedtime   famotidine (PEPCID) 40 MG tablet Take 1 tablet (40 mg total) by mouth daily.   ibuprofen (ADVIL) 800 MG tablet Take 1 tablet (800 mg total) by mouth every 8 (eight) hours as needed.   ondansetron (ZOFRAN) 4 MG tablet TAKE 1 TABLET BY MOUTH EVERY 8 HOURS AS NEEDED  FOR NAUSEA OR VOMITING   predniSONE (DELTASONE) 20 MG tablet Take one tablet by mouth 3 times daily for 2 days , then one tablet twice daily for 2 days, then one tablet once daily for 3 days, then one tablet every other day for 6 days, then stop   promethazine-dextromethorphan (PROMETHAZINE-DM) 6.25-15 MG/5ML syrup Take one teaspoon at bedtime , as needed, for excessive cough   rizatriptan (MAXALT) 10 MG tablet Take 1 tablet (10 mg total) by mouth as needed for migraine. May repeat in 2 hours if needed   spironolactone (ALDACTONE) 100 MG tablet Take 1 tablet (100 mg total) by mouth daily.   topiramate (TOPAMAX) 100 MG tablet Take 1 tablet (100 mg total) by mouth 2 (two) times daily.   No facility-administered encounter medications on file as of 08/29/2021.    Self-harm and/or Suicidal Behaviors Risk Assessment Self-harm risk factors: no Patient endorses recent self injurious thoughts and/or behaviors: No   Suicide ideations: No plan to harm self or others   Danger to Others Risk Assessment Danger to others risk factors: no Patient endorses recent thoughts of harming others: No    Substance Use Assessment Patient recently consumed alcohol: No  Patient recently used drugs: No  Patient is concerned about dependence or abuse of substances: No    Goals, Interventions and Follow-up Plan Goals: Increase healthy adjustment to current life circumstances Interventions:  biweekly VBH sessions   Summary of Clinical Assessment  Olivia Woodward is a 55 yr old woman that was referred by her PCP.  She expressed her frustration as it pertains to her Short term Disability and not being able to get her paperwork submitted so she can receive a payment.  She has a desire to go back to work but continues to have difficulty with concentration, decision making, anhedonia and continues to have a poor sleep pattern.   Therapist did a brief mood check, assessing anger, fear, disgust, excitement, happiness, and  sadness. Therapist provided active listening for Patient as she communicated her current stressors.  Therapist attempted to normalize her feelings about her current relationship and struggles. Therapist discussed with Patient how to manage her symptoms.  Therapist explained what managing symptoms mean and how to prevent relapse.  Discussion of coping with stress, building social support, develop a relapse prevention plan and the importance of medication compliance. Reviewed in detail each step.  Assisted Patient with comprehension by providing examples of each step and allowing Patient to make a plan to successful utilize each step.   *Writer contacted [facility] to assist with submission   Follow-up Plan: Weekly  VBH session Lubertha South, LCSW

## 2021-08-31 ENCOUNTER — Telehealth (INDEPENDENT_AMBULATORY_CARE_PROVIDER_SITE_OTHER): Payer: 59 | Admitting: Psychiatry

## 2021-08-31 ENCOUNTER — Other Ambulatory Visit: Payer: Self-pay

## 2021-08-31 ENCOUNTER — Encounter: Payer: Self-pay | Admitting: Psychiatry

## 2021-08-31 DIAGNOSIS — F331 Major depressive disorder, recurrent, moderate: Secondary | ICD-10-CM

## 2021-08-31 DIAGNOSIS — F419 Anxiety disorder, unspecified: Secondary | ICD-10-CM

## 2021-08-31 DIAGNOSIS — G4700 Insomnia, unspecified: Secondary | ICD-10-CM

## 2021-08-31 MED ORDER — ESZOPICLONE 2 MG PO TABS: 2.0000 mg | ORAL_TABLET | Freq: Every evening | ORAL | 0 refills | Status: DC | PRN

## 2021-08-31 MED ORDER — REXULTI 0.5 MG PO TABS: 0.5000 mg | ORAL_TABLET | Freq: Every day | ORAL | 0 refills | Status: DC

## 2021-08-31 NOTE — Patient Instructions (Signed)
Continue lexapro 20 mg daily Discontinue Abilify  Start rexulti 0.5 mg daily after nausea, constipation, and blurry vision resolve Increase Lunesta 2 mg at night as needed for sleep Decrease Xanax 0.25 mg in AM  Next appointment: 11/2 at 11:30

## 2021-09-05 ENCOUNTER — Telehealth: Payer: Self-pay

## 2021-09-05 ENCOUNTER — Institutional Professional Consult (permissible substitution): Payer: PRIVATE HEALTH INSURANCE | Admitting: Neurology

## 2021-09-05 NOTE — Telephone Encounter (Signed)
Nichole, can you please call [name] about her being taken out of work and paperwork she needs.  [facility] did not take her out of work and can not fill out any paperwork for that.  Please call her at [phone]

## 2021-09-06 ENCOUNTER — Other Ambulatory Visit: Payer: Self-pay | Admitting: Family Medicine

## 2021-09-06 ENCOUNTER — Encounter: Payer: Self-pay | Admitting: Family Medicine

## 2021-09-07 ENCOUNTER — Other Ambulatory Visit: Payer: Self-pay

## 2021-09-07 ENCOUNTER — Encounter: Payer: Self-pay | Admitting: Internal Medicine

## 2021-09-07 ENCOUNTER — Ambulatory Visit (INDEPENDENT_AMBULATORY_CARE_PROVIDER_SITE_OTHER): Payer: 59 | Admitting: Internal Medicine

## 2021-09-07 DIAGNOSIS — J069 Acute upper respiratory infection, unspecified: Secondary | ICD-10-CM | POA: Diagnosis not present

## 2021-09-07 MED ORDER — PROMETHAZINE-DM 6.25-15 MG/5ML PO SYRP: 5.0000 mL | ORAL_SOLUTION | Freq: Four times a day (QID) | ORAL | 0 refills | Status: DC | PRN

## 2021-09-07 MED ORDER — AZITHROMYCIN 250 MG PO TABS: ORAL_TABLET | ORAL | 0 refills | Status: AC

## 2021-09-07 NOTE — Progress Notes (Signed)
Virtual Visit via Telephone Note   This visit type was conducted due to national recommendations for restrictions regarding the COVID-19 Pandemic (e.g. social distancing) in an effort to limit this patient's exposure and mitigate transmission in our community.  Due to her co-morbid illnesses, this patient is at least at moderate risk for complications without adequate follow up.  This format is felt to be most appropriate for this patient at this time.  The patient did not have access to video technology/had technical difficulties with video requiring transitioning to audio format only (telephone).  All issues noted in this document were discussed and addressed.  No physical exam could be performed with this format.  Evaluation Performed:  Follow-up visit  Date:  09/07/2021   ID:  RADHA COGGINS, DOB 03-01-1966, MRN [id number]  Patient Location: Home Provider Location: Office/Clinic  Participants: Patient Location of Patient: Home Location of Provider: Telehealth Consent was obtain for visit to be over via telehealth. I verified that I am speaking with the correct person using two identifiers.  PCP:  Fayrene Helper, MD   Chief Complaint: Cough, nasal congestion, sore throat  History of Present Illness:    Olivia Woodward is a 55 y.o. female who has had televisit for complaint of cough, nasal congestion, rhinorrhea, sore throat and fatigue for last 3-4 days.  She has tried Robitussin with minimal relief.  She denies any dyspnea or wheezing.  Her COVID test at home was negative.  She has had 2 doses of COVID-vaccine.  The patient does have symptoms concerning for COVID-19 infection (fever, chills, cough, or new shortness of breath).   Past Medical, Surgical, Social History, Allergies, and Medications have been Reviewed.  Past Medical History:  Diagnosis Date   Anemia    Anxiety    Phreesia 03/18/2021   Depression    Phreesia 03/18/2021   Generalized headaches     Hypertension    Past Surgical History:  Procedure Laterality Date   ABDOMINAL HYSTERECTOMY     fibroids, partial   BREAST REDUCTION SURGERY  2005   COLONOSCOPY WITH PROPOFOL N/A 05/10/2019   Procedure: COLONOSCOPY WITH PROPOFOL;  Surgeon: Daneil Dolin, MD;  [facility];  Service: Endoscopy;  Laterality: N/A;  2:45pm   GASTRIC BYPASS  2007   POLYPECTOMY  05/10/2019   Procedure: POLYPECTOMY;  Surgeon: Daneil Dolin, MD;  [facility]: [facility];  Service: Endoscopy;;  colon     Current Meds  Medication Sig   albuterol (VENTOLIN HFA) 108 (90 Base) MCG/ACT inhaler Inhale 2 puffs into the lungs every 6 (six) hours as needed for wheezing or shortness of breath.   ALPRAZolam (XANAX) 0.25 MG tablet Take 1 tablet (0.25 mg total) by mouth 2 (two) times daily as needed for anxiety.   amLODipine (NORVASC) 10 MG tablet Take 1 tablet (10 mg total) by mouth daily.   Brexpiprazole (REXULTI) 0.5 MG TABS Take 1 tablet (0.5 mg total) by mouth daily.   Cyanocobalamin (B-12) 2500 MCG TABS Take 1 tablet by mouth daily.   cyclobenzaprine (FLEXERIL) 10 MG tablet Take one tablet by mouth at bedtime , for neck spasm   ergocalciferol (VITAMIN D2) 1.25 MG (50000 UT) capsule Take 1 capsule (50,000 Units total) by mouth once a week. One capsule once weekly   escitalopram (LEXAPRO) 20 MG tablet Take 1 tablet (20 mg total) by mouth daily. Fill after 10/1   eszopiclone (LUNESTA) 2 MG TABS tablet Take 1 tablet (2 mg  total) by mouth at bedtime as needed for sleep. Take immediately before bedtime   famotidine (PEPCID) 40 MG tablet Take 1 tablet (40 mg total) by mouth daily.   ibuprofen (ADVIL) 800 MG tablet Take 1 tablet (800 mg total) by mouth every 8 (eight) hours as needed.   ondansetron (ZOFRAN) 4 MG tablet TAKE 1 TABLET BY MOUTH EVERY 8 HOURS AS NEEDED FOR NAUSEA OR VOMITING   rizatriptan (MAXALT) 10 MG tablet Take 1 tablet (10 mg total) by mouth as needed for migraine. May repeat in 2 hours if needed    spironolactone (ALDACTONE) 100 MG tablet Take 1 tablet (100 mg total) by mouth daily.   topiramate (TOPAMAX) 100 MG tablet Take 1 tablet (100 mg total) by mouth 2 (two) times daily.     Allergies:   Ketorolac tromethamine   ROS:   Please see the history of present illness.     All other systems reviewed and are negative.   Labs/Other Tests and Data Reviewed:    Recent Labs: 10/04/2020: B Natriuretic Peptide 23.0; Platelets 374 03/22/2021: ALT 6; BUN 13; Creatinine, Ser 1.02; Hemoglobin 12.7; Potassium 4.7; Sodium 138 08/02/2021: TSH 0.820   Recent Lipid Panel Lab Results  Component Value Date/Time   CHOL 164 03/22/2021 09:30 AM   TRIG 73 03/22/2021 09:30 AM   HDL 69 03/22/2021 09:30 AM   CHOLHDL 2.4 03/22/2021 09:30 AM   CHOLHDL 2.0 12/27/2017 09:49 AM   LDLCALC 81 03/22/2021 09:30 AM   LDLCALC 71 12/27/2017 09:49 AM    Wt Readings from Last 3 Encounters:  08/07/21 252 lb 3.2 oz (114.4 kg)  06/19/21 253 lb (114.8 kg)  06/11/21 254 lb (115.2 kg)     ASSESSMENT & PLAN:    URTI Started Azithromycin as she has persistent symptoms despite symptomatic treatment Promethazine-DM PRN for cough Nasal saline spray PRN  Time:   Today, I have spent 9 minutes reviewing the chart, including problem list, medications, and with the patient with telehealth technology discussing the above problems.   Medication Adjustments/Labs and Tests Ordered: Current medicines are reviewed at length with the patient today.  Concerns regarding medicines are outlined above.   Tests Ordered: No orders of the defined types were placed in this encounter.   Medication Changes: No orders of the defined types were placed in this encounter.    Note: This dictation was prepared with Dragon dictation along with smaller phrase technology. Similar sounding words can be transcribed inadequately or may not be corrected upon review. Any transcriptional errors that result from this process are unintentional.       Disposition:  Follow up  Signed, Lindell Spar, MD  09/07/2021 8:13 AM     [facility]

## 2021-09-13 ENCOUNTER — Telehealth (HOSPITAL_COMMUNITY): Payer: Self-pay | Admitting: Psychiatry

## 2021-09-13 ENCOUNTER — Other Ambulatory Visit: Payer: Self-pay

## 2021-09-13 ENCOUNTER — Ambulatory Visit (INDEPENDENT_AMBULATORY_CARE_PROVIDER_SITE_OTHER): Payer: 59 | Admitting: Licensed Clinical Social Worker

## 2021-09-13 DIAGNOSIS — F331 Major depressive disorder, recurrent, moderate: Secondary | ICD-10-CM

## 2021-09-13 NOTE — Telephone Encounter (Signed)
D:  [name] and [name], LCSW referred pt to MH-IOP.  A:  Placed call to orient pt and provide her with a start date.  Pt voiced concerns about the cost of MH-IOP.  Encouraged pt to call Christella Scheuermann to verify her benefits and call the case manager back.  Pt states she very interested but is just concerned because she is currently not working.  Inform [name] and Elmyra Ricks.

## 2021-09-17 NOTE — Progress Notes (Signed)
Virtual Visit via Video Note  I connected with Olivia Woodward on 09/19/21 at 11:30 AM EDT by a video enabled telemedicine application and verified that I am speaking with the correct person using two identifiers.  Location: Patient: home Provider: office Persons participated in the visit- patient, provider    I discussed the limitations of evaluation and management by telemedicine and the availability of in person appointments. The patient expressed understanding and agreed to proceed.   I discussed the assessment and treatment plan with the patient. The patient was provided an opportunity to ask questions and all were answered. The patient agreed with the plan and demonstrated an understanding of the instructions.   The patient was advised to call back or seek an in-person evaluation if the symptoms worsen or if the condition fails to improve as anticipated.  I provided 17 minutes of non-face-to-face time during this encounter.   Olivia Clay, MD    [facility] MD/PA/NP OP Progress Note  09/19/2021 1:31 PM Olivia Woodward  MRN:  [id number]  Chief Complaint:  Chief Complaint   Follow-up; Depression    HPI:  This is a follow-up appointment for depression and anxiety.  She states that she is having migraine for the past few days.  She continues to feel depressed.  While reviewing her medication, she was unsure which medication she is taking.  She was asked to bring medication bottles, and read name of the medication she is taking.  Although she initially states that she has been taking Lexapro 20 mg twice a day, she states that she is taking it only once a day, and takes Lunesta at night around the end of the interview.  While reviewing the medication, she states that she feels confused and she does not know which medication she takes.  She states that she continues to have tinnitus and constipation.  Her visual change has improved since she had new glasses.  She now thinks that these  symptoms were not related to the medication.  She has an upcoming appointment with PCP, and agrees to discuss her symptoms for father evaluation.  She has middle insomnia and significant fatigue.  She denies change in appetite or weight.  She feels anxious.  She denies SI.  She has not started it rexulti; she agrees to start this medication this time.    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 (75 year old daughter, and 39 yo son, going to college in 2022) Education: graduated from [employer], attended [employer]  Visit Diagnosis:    ICD-10-CM   1. Moderate episode of recurrent major depressive disorder (HCC)  F33.1     2. Anxiety  F41.9       Past Psychiatric History: Please see initial evaluation for full details. I have reviewed the history. No updates at this time.     Past Medical History:  Past Medical History:  Diagnosis Date   Anemia    Anxiety    Phreesia 03/18/2021   Depression    Phreesia 03/18/2021   Generalized headaches    Hypertension     Past Surgical History:  Procedure Laterality Date   ABDOMINAL HYSTERECTOMY     fibroids, partial   BREAST REDUCTION SURGERY  2005   COLONOSCOPY WITH PROPOFOL N/A 05/10/2019   Procedure: COLONOSCOPY WITH PROPOFOL;  Surgeon: Daneil Dolin, MD;  [facility];  Service: Endoscopy;  Laterality: N/A;  2:45pm   GASTRIC BYPASS  2007   POLYPECTOMY  05/10/2019   Procedure: POLYPECTOMY;  Surgeon: Daneil Dolin, MD;  [facility];  Service: Endoscopy;;  colon    Family Psychiatric History: Please see initial evaluation for full details. I have reviewed the history. No updates at this time.     Family History:  Family History  Problem Relation Age of Onset   Hypertension Mother    Diabetes Mother    Drug abuse Mother     Hypertension Father    Hypertension Sister    Hypertension Sister    Colon cancer Neg Hx     Social History:  Social History   Socioeconomic History   Marital status: Single    Spouse name: Not on file   Number of children: Not on file   Years of education: Not on file   Highest education level: Not on file  Occupational History   Not on file  Tobacco Use   Smoking status: Never   Smokeless tobacco: Never  Substance and Sexual Activity   Alcohol use: No   Drug use: No   Sexual activity: Yes    Birth control/protection: Condom  Other Topics Concern   Not on file  Social History Narrative   Not on file   Social Determinants of Health   Financial Resource Strain: Not on file  Food Insecurity: Not on file  Transportation Needs: Not on file  Physical Activity: Not on file  Stress: Not on file  Social Connections: Not on file    Allergies:  Allergies  Allergen Reactions   Ketorolac Tromethamine Hives    Lips swelled    Metabolic Disorder Labs: Lab Results  Component Value Date   HGBA1C 5.4 03/22/2021   No results found for: PROLACTIN Lab Results  Component Value Date   CHOL 164 03/22/2021   TRIG 73 03/22/2021   HDL 69 03/22/2021   CHOLHDL 2.4 03/22/2021   VLDL 10 01/02/2017   LDLCALC 81 03/22/2021   LDLCALC 124 (H) 09/06/2020   Lab Results  Component Value Date   TSH 0.820 08/02/2021   TSH 0.409 (L) 03/22/2021    Therapeutic Level Labs: No results found for: LITHIUM No results found for: VALPROATE No components found for:  CBMZ  Current Medications: Current Outpatient Medications  Medication Sig Dispense Refill   albuterol (VENTOLIN HFA) 108 (90 Base) MCG/ACT inhaler Inhale 2 puffs into the lungs every 6 (six) hours as needed for wheezing or shortness of breath. 18 g 0   ALPRAZolam (XANAX) 0.25 MG tablet Take 1 tablet (0.25 mg total) by mouth 2 (two) times daily as needed for anxiety. 60 tablet 1   amLODipine (NORVASC) 10 MG tablet Take 1  tablet (10 mg total) by mouth daily. 90 tablet 3   Brexpiprazole (REXULTI) 0.5 MG TABS Take 1 tablet (0.5 mg total) by mouth daily. 30 tablet 0   Cyanocobalamin (B-12) 2500 MCG TABS Take 1 tablet by mouth daily.     cyclobenzaprine (FLEXERIL) 10 MG tablet Take one tablet by mouth at bedtime , for neck spasm 30 tablet 0   ergocalciferol (VITAMIN D2) 1.25 MG (50000 UT) capsule Take 1 capsule (50,000 Units total) by mouth once a week. One capsule once weekly 12 capsule 2   escitalopram (LEXAPRO) 20 MG tablet Take 1 tablet (20 mg total) by mouth daily. Fill after 10/1 30 tablet 2   [START ON 10/01/2021] eszopiclone (  LUNESTA) 2 MG TABS tablet Take 1 tablet (2 mg total) by mouth at bedtime as needed for sleep. Take immediately before bedtime 30 tablet 0   famotidine (PEPCID) 40 MG tablet Take 1 tablet (40 mg total) by mouth daily. 30 tablet 0   ibuprofen (ADVIL) 800 MG tablet Take 1 tablet (800 mg total) by mouth every 8 (eight) hours as needed. 30 tablet 0   ondansetron (ZOFRAN) 4 MG tablet TAKE 1 TABLET BY MOUTH EVERY 8 HOURS AS NEEDED FOR NAUSEA OR VOMITING 20 tablet 0   promethazine-dextromethorphan (PROMETHAZINE-DM) 6.25-15 MG/5ML syrup Take 5 mLs by mouth 4 (four) times daily as needed for cough. 118 mL 0   rizatriptan (MAXALT) 10 MG tablet Take 1 tablet (10 mg total) by mouth as needed for migraine. May repeat in 2 hours if needed 10 tablet 0   spironolactone (ALDACTONE) 100 MG tablet Take 1 tablet (100 mg total) by mouth daily. 30 tablet 3   topiramate (TOPAMAX) 100 MG tablet Take 1 tablet (100 mg total) by mouth 2 (two) times daily. 60 tablet 3   No current facility-administered medications for this visit.     Musculoskeletal: Strength & Muscle Tone:  N/A Gait & Station:  N/A Patient leans: N/A  Psychiatric Specialty Exam: Review of Systems  Psychiatric/Behavioral:  Positive for decreased concentration, dysphoric mood and sleep disturbance. Negative for agitation, behavioral problems,  confusion, hallucinations, self-injury and suicidal ideas. The patient is nervous/anxious. The patient is not hyperactive.   All other systems reviewed and are negative.  There were no vitals taken for this visit.There is no height or weight on file to calculate BMI.  General Appearance: Fairly Groomed  Eye Contact:  Good  Speech:  Clear and Coherent  Volume:  Normal  Mood:   not good  Affect:  Appropriate, Congruent, and Restricted  Thought Process:  Coherent  Orientation:  Full (Time, Place, and Person)  Thought Content: Logical   Suicidal Thoughts:  No  Homicidal Thoughts:  No  Memory:  Immediate;   Good  Judgement:  Good  Insight:  Present  Psychomotor Activity:  Normal  Concentration:  Concentration: Poor and Attention Span: Poor  Recall:  Poor  Fund of Knowledge: Good  Language: Good  Akathisia:  No  Handed:  Right  AIMS (if indicated): not done  Assets:  Communication Skills Desire for Improvement  ADL's:  Intact  Cognition: WNL  Sleep:  Poor   Screenings: GAD-7    Flowsheet Row Office Visit from 08/16/2021 in [facility] Phone Follow Up from 06/26/2021 in [facility] Video Visit from 06/12/2021 in [facility] Video Visit from 05/04/2021 in [facility] Office Visit from 03/17/2019 in [facility]  Total GAD-7 Score 13 14 13 12 5       PHQ2-9    [place] Office Visit from 09/13/2021 in [facility] Office Visit from 09/07/2021 in [facility] Office Visit from 08/29/2021 in [facility] Office Visit from 08/16/2021 in [facility] Phone Follow Up from 06/26/2021 in [facility]  PHQ-2 Total Score 5 0 6 6 6   PHQ-9 Total Score 18 0 19 21 15       Flowsheet Row Video Visit from 08/31/2021 in [facility] Video Visit from 07/27/2021 in [facility] Office Visit from  06/19/2021 in [facility] Error: Q3, 4, or 5 should not be populated when  Q2 is No Error: Question 6 not populated Error: Q3, 4, or 5 should not be populated when Q2 is No        Assessment and Plan:   RASHEEDA MULVEHILL is a 55 y.o. year old female with a history of depression, iron deficiency anemia,  multinodular goiter, subclinical hyperthyroidism, vitamin D deficiency, hypertension, s/p gastric bypass surgery in 2017, who presents for follow up appointment for below.   1. Moderate episode of recurrent major depressive disorder ([place]) 2. Anxiety Exam is notable for significant difficulty in concentration, and patient needs to be repeated of instruction many times through the visit.  Psychosocial stressors includes conflict with her children, and the recent loss of her aunt.  Although it was discussed to discontinue Abilify given her reported side effect of constipation and tinnitus, it has not been result as of now.  She has an upcoming appointment with her PCP for further evaluation.  Will start rexulti as adjunctive treatment for depression.  Discussed potential metabolic side effect and EPS.  Will continue Lexapro to target depression and anxiety.  Will continue Xanax as needed for anxiety.  She will continue to see a therapist through [facility].   # Insomnia Unchanged.  She has snoring, fatigue and middle insomnia.  She has an upcoming appointment for sleep evaluation.  Will continue current dose of Lunesta to target insomnia.   This clinician has discussed the side effect associated with medication prescribed during this encounter. Please refer to notes in the previous encounters for more details.     Plan Continue lexapro 20 mg daily Start rexulti 0.5 mg daily  Continue Lunesta 2 mg at night as needed for sleep Continue Xanax 0.25 mg in AM (used to be on 1 mg at night ) Next appointment: 11/30 at 9:30 for 30 mins video   I would support that she would remain  out from work with expected return date on Feb 2nd.  She has depressive symptoms with significant impairment in cognition/memory and fatigue, which interferes with her ability to work at her capacity.      Past trials of medication: sertraline fluoxetine, lexapro, venlafaxine ("funny"), bupropion (dry mouth, nausea), quetiapine (drowsiness), Abilify (nausea, constipation, tinnitus), Xanax, temazepam, Trazodone, Ambien, Belsomra (could not afford)   The patient demonstrates the following risk factors for suicide: Chronic risk factors for suicide include: psychiatric disorder of depression. Acute risk factors for suicide include: loss (financial, interpersonal, professional). Protective factors for this patient include: responsibility to others (children, family), coping skills, hope for the future and religious beliefs against suicide. Considering these factors, the overall suicide risk at this point appears to be low. Patient is appropriate for outpatient follow up.      Olivia Clay, MD 09/19/2021, 1:31 PM

## 2021-09-19 ENCOUNTER — Encounter: Payer: Self-pay | Admitting: Psychiatry

## 2021-09-19 ENCOUNTER — Telehealth (INDEPENDENT_AMBULATORY_CARE_PROVIDER_SITE_OTHER): Payer: 59 | Admitting: Licensed Clinical Social Worker

## 2021-09-19 ENCOUNTER — Telehealth (INDEPENDENT_AMBULATORY_CARE_PROVIDER_SITE_OTHER): Payer: 59 | Admitting: Psychiatry

## 2021-09-19 ENCOUNTER — Other Ambulatory Visit: Payer: Self-pay

## 2021-09-19 DIAGNOSIS — F419 Anxiety disorder, unspecified: Secondary | ICD-10-CM | POA: Diagnosis not present

## 2021-09-19 DIAGNOSIS — F331 Major depressive disorder, recurrent, moderate: Secondary | ICD-10-CM | POA: Diagnosis not present

## 2021-09-19 MED ORDER — ESZOPICLONE 2 MG PO TABS: 2.0000 mg | ORAL_TABLET | Freq: Every evening | ORAL | 0 refills | Status: DC | PRN

## 2021-09-19 NOTE — BH Specialist Note (Signed)
[facility] Follow Up Assessment  MRN: [id number] NAME: Olivia Woodward Date: 09/19/21  Start time: 1p End time: 120p Total time: 20  Type of Contact: Follow up Call  Current concerns/stressors: financial  Screens/Assessment Tools:  GAD 7 : Generalized Anxiety Score 08/17/2021 06/26/2021 06/13/2021 05/04/2021  Nervous, Anxious, on Edge 2 2 2 2   Control/stop worrying 2 2 2 2   Worry too much - different things 2 2 2 2   Trouble relaxing 2 2 2 2   Restless 2 2 2 1   Easily annoyed or irritable 2 2 2 2   Afraid - awful might happen 1 2 1 1   Total GAD 7 Score 13 14 13 12   Anxiety Difficulty Very difficult Somewhat difficult Very difficult Very difficult      Functional Assessment:  Sleep: poor Appetite: fair Coping ability: overwhelmed Patient taking medications as prescribed:    Current medications:  Outpatient Encounter Medications as of 09/13/2021  Medication Sig   albuterol (VENTOLIN HFA) 108 (90 Base) MCG/ACT inhaler Inhale 2 puffs into the lungs every 6 (six) hours as needed for wheezing or shortness of breath.   ALPRAZolam (XANAX) 0.25 MG tablet Take 1 tablet (0.25 mg total) by mouth 2 (two) times daily as needed for anxiety.   amLODipine (NORVASC) 10 MG tablet Take 1 tablet (10 mg total) by mouth daily.   Brexpiprazole (REXULTI) 0.5 MG TABS Take 1 tablet (0.5 mg total) by mouth daily.   Cyanocobalamin (B-12) 2500 MCG TABS Take 1 tablet by mouth daily.   cyclobenzaprine (FLEXERIL) 10 MG tablet Take one tablet by mouth at bedtime , for neck spasm   ergocalciferol (VITAMIN D2) 1.25 MG (50000 UT) capsule Take 1 capsule (50,000 Units total) by mouth once a week. One capsule once weekly   escitalopram (LEXAPRO) 20 MG tablet Take 1 tablet (20 mg total) by mouth daily. Fill after 10/1   eszopiclone (LUNESTA) 2 MG TABS tablet Take 1 tablet (2 mg total) by mouth at bedtime as needed for sleep. Take immediately before bedtime   famotidine (PEPCID) 40 MG tablet Take 1 tablet  (40 mg total) by mouth daily.   ibuprofen (ADVIL) 800 MG tablet Take 1 tablet (800 mg total) by mouth every 8 (eight) hours as needed.   ondansetron (ZOFRAN) 4 MG tablet TAKE 1 TABLET BY MOUTH EVERY 8 HOURS AS NEEDED FOR NAUSEA OR VOMITING   promethazine-dextromethorphan (PROMETHAZINE-DM) 6.25-15 MG/5ML syrup Take 5 mLs by mouth 4 (four) times daily as needed for cough.   rizatriptan (MAXALT) 10 MG tablet Take 1 tablet (10 mg total) by mouth as needed for migraine. May repeat in 2 hours if needed   spironolactone (ALDACTONE) 100 MG tablet Take 1 tablet (100 mg total) by mouth daily.   topiramate (TOPAMAX) 100 MG tablet Take 1 tablet (100 mg total) by mouth 2 (two) times daily.   No facility-administered encounter medications on file as of 09/13/2021.    Self-harm and/or Suicidal Behaviors Risk Assessment Self-harm risk factors: no Patient endorses recent self injurious thoughts and/or behaviors: No   Suicide ideations: No plan to harm self or others   Danger to Others Risk Assessment Danger to others risk factors: no Patient endorses recent thoughts of harming others: No    Substance Use Assessment Patient recently consumed alcohol: No  Patient recently used drugs: No  Patient is concerned about dependence or abuse of substances: No    Goals, Interventions and Follow-up Plan Goals: Increase healthy adjustment to current life circumstances Interventions: Mindfulness  or Relaxation Training and CBT Cognitive Behavioral Therapy   Summary of Clinical Assessment  Olivia Woodward is a 55 yr old woman who present for her follow up. She reports that she is worried about her job and her ability to financially care for herself and her family.  She expressed concern about her short term disability forms and how she has been frustrated.  She continues to exhibit anhedonia, decreased concentration, forgetfulness, sadness and poor sleep pattern.  Factors that contribute to client's ongoing depressive  symptoms were discussed and include real and perceived feelings of isolation, criticism, rejection, shame and guilt.    Follow-up Plan:  biweekly VBH session and referral to [place], LCSW

## 2021-09-19 NOTE — Patient Instructions (Signed)
Continue lexapro 20 mg daily Start rexulti 0.5 mg daily  Continue Lunesta 2 mg at night as needed for sleep Continue Xanax 0.25 mg in AM  Next appointment: 11/30 at 9:30

## 2021-09-19 NOTE — BH Specialist Note (Signed)
Virtual Behavioral Health Treatment Plan Team Note  MRN: [id number] NAME: Olivia Woodward  DATE: 09/21/21  Start time: 302p  End time:  307 Total time:  5 min  Total number of Virtual [place] Treatment Team Plan encounters:  5  Treatment Team Attendees: [name], LCSW & [name]  Diagnoses:    ICD-10-CM   1. MDD (major depressive disorder), recurrent episode, moderate (HCC)  F33.1       Goals, Interventions and Follow-up Plan Goals: Increase healthy adjustment to current life circumstances Interventions: Mindfulness or Relaxation Training CBT Cognitive Behavioral Therapy Medication Management Recommendations: no change Follow-up Plan: biweekly VBH session and referral to IOP  History of the present illness Presenting Problem/Current Symptoms: symptoms of DX  Psychiatric History  Depression: Yes Anxiety: No Mania: No Psychosis: No PTSD symptoms: No  Past Psychiatric History/Hospitalization(s): Hospitalization for psychiatric illness: No Prior Suicide Attempts: No Prior Self-injurious behavior: No  Psychosocial stressors Flowsheet Row Virtual BH Phone Follow Up from 08/04/2018 in [facility]  Current Stressors Family death  [Medication is not helping her level of depression and anxiety. ]  Familial Stressors None  Sleep Decreased, Difficulty falling asleep, Difficulty staying asleep       Self-harm Behaviors Risk Assessment Flowsheet Row Virtual BH Phone Follow Up from 07/16/2018 in [facility]  Self-harm risk factors --  [None Reported]  Have you recently had any thoughts about harming yourself? No       Screenings PHQ-9 Assessments:  Depression screen [employer] 2/9 09/21/2021 09/19/2021 09/07/2021  Decreased Interest 1 3 0  Down, Depressed, Hopeless 1 2 0  PHQ - 2 Score 2 5 0  Altered sleeping - 2 0  Tired, decreased energy - 2 0  Change in appetite - 2 0  Feeling bad or failure about yourself  - 3 0  Trouble concentrating - 2 0   Moving slowly or fidgety/restless - 2 0  Suicidal thoughts - 0 0  PHQ-9 Score - 18 0  Difficult doing work/chores - Extremely dIfficult Not difficult at all  Some recent data might be hidden   GAD-7 Assessments:  GAD 7 : Generalized Anxiety Score 08/17/2021 06/26/2021 06/13/2021 05/04/2021  Nervous, Anxious, on Edge 2 2 2 2   Control/stop worrying 2 2 2 2   Worry too much - different things 2 2 2 2   Trouble relaxing 2 2 2 2   Restless 2 2 2 1   Easily annoyed or irritable 2 2 2 2   Afraid - awful might happen 1 2 1 1   Total GAD 7 Score 13 14 13 12   Anxiety Difficulty Very difficult Somewhat difficult Very difficult Very difficult    Past Medical History Past Medical History:  Diagnosis Date   Anemia    Anxiety    Phreesia 03/18/2021   Depression    Phreesia 03/18/2021   Generalized headaches    Hypertension     Vital signs: There were no vitals filed for this visit.  Allergies:  Allergies as of 09/19/2021 - Review Complete 09/19/2021  Allergen Reaction Noted   Ketorolac tromethamine Hives 03/16/2009    Medication History Current medications:  Outpatient Encounter Medications as of 09/19/2021  Medication Sig   albuterol (VENTOLIN HFA) 108 (90 Base) MCG/ACT inhaler Inhale 2 puffs into the lungs every 6 (six) hours as needed for wheezing or shortness of breath.   ALPRAZolam (XANAX) 0.25 MG tablet Take 1 tablet (0.25 mg total) by mouth 2 (two) times daily as needed for anxiety.   amLODipine (  NORVASC) 10 MG tablet Take 1 tablet (10 mg total) by mouth daily.   Brexpiprazole (REXULTI) 0.5 MG TABS Take 1 tablet (0.5 mg total) by mouth daily.   Cyanocobalamin (B-12) 2500 MCG TABS Take 1 tablet by mouth daily.   cyclobenzaprine (FLEXERIL) 10 MG tablet Take one tablet by mouth at bedtime , for neck spasm   ergocalciferol (VITAMIN D2) 1.25 MG (50000 UT) capsule Take 1 capsule (50,000 Units total) by mouth once a week. One capsule once weekly   escitalopram (LEXAPRO) 20 MG tablet Take 1  tablet (20 mg total) by mouth daily. Fill after 10/1   [START ON 10/01/2021] eszopiclone (LUNESTA) 2 MG TABS tablet Take 1 tablet (2 mg total) by mouth at bedtime as needed for sleep. Take immediately before bedtime   famotidine (PEPCID) 40 MG tablet Take 1 tablet (40 mg total) by mouth daily.   ibuprofen (ADVIL) 800 MG tablet Take 1 tablet (800 mg total) by mouth every 8 (eight) hours as needed.   promethazine-dextromethorphan (PROMETHAZINE-DM) 6.25-15 MG/5ML syrup Take 5 mLs by mouth 4 (four) times daily as needed for cough.   rizatriptan (MAXALT) 10 MG tablet Take 1 tablet (10 mg total) by mouth as needed for migraine. May repeat in 2 hours if needed   spironolactone (ALDACTONE) 100 MG tablet Take 1 tablet (100 mg total) by mouth daily.   topiramate (TOPAMAX) 100 MG tablet Take 1 tablet (100 mg total) by mouth 2 (two) times daily.   [DISCONTINUED] ondansetron (ZOFRAN) 4 MG tablet TAKE 1 TABLET BY MOUTH EVERY 8 HOURS AS NEEDED FOR NAUSEA OR VOMITING   No facility-administered encounter medications on file as of 09/19/2021.     Scribe for Treatment Team: Lubertha South, LCSW

## 2021-09-21 ENCOUNTER — Encounter: Payer: Self-pay | Admitting: Family Medicine

## 2021-09-21 ENCOUNTER — Other Ambulatory Visit: Payer: Self-pay

## 2021-09-21 ENCOUNTER — Ambulatory Visit (INDEPENDENT_AMBULATORY_CARE_PROVIDER_SITE_OTHER): Payer: 59 | Admitting: Family Medicine

## 2021-09-21 VITALS — BP 125/71 | HR 94 | Ht 66.0 in | Wt 253.1 lb

## 2021-09-21 DIAGNOSIS — N760 Acute vaginitis: Secondary | ICD-10-CM

## 2021-09-21 DIAGNOSIS — H6121 Impacted cerumen, right ear: Secondary | ICD-10-CM | POA: Insufficient documentation

## 2021-09-21 DIAGNOSIS — I10 Essential (primary) hypertension: Secondary | ICD-10-CM

## 2021-09-21 DIAGNOSIS — Z23 Encounter for immunization: Secondary | ICD-10-CM

## 2021-09-21 DIAGNOSIS — B009 Herpesviral infection, unspecified: Secondary | ICD-10-CM | POA: Insufficient documentation

## 2021-09-21 DIAGNOSIS — F322 Major depressive disorder, single episode, severe without psychotic features: Secondary | ICD-10-CM

## 2021-09-21 DIAGNOSIS — Z1231 Encounter for screening mammogram for malignant neoplasm of breast: Secondary | ICD-10-CM | POA: Diagnosis not present

## 2021-09-21 MED ORDER — SAXENDA 18 MG/3ML ~~LOC~~ SOPN
1.8000 mg | PEN_INJECTOR | Freq: Every day | SUBCUTANEOUS | 0 refills | Status: DC
Start: 2021-11-20 — End: 2021-11-27

## 2021-09-21 MED ORDER — SAXENDA 18 MG/3ML ~~LOC~~ SOPN: 0.6000 mg | PEN_INJECTOR | Freq: Every day | SUBCUTANEOUS | 1 refills | Status: DC

## 2021-09-21 MED ORDER — ACYCLOVIR 800 MG PO TABS: 800.0000 mg | ORAL_TABLET | Freq: Every day | ORAL | 0 refills | Status: DC

## 2021-09-21 MED ORDER — SAXENDA 18 MG/3ML ~~LOC~~ SOPN
1.2000 mg | PEN_INJECTOR | Freq: Every day | SUBCUTANEOUS | 0 refills | Status: DC
Start: 2021-10-21 — End: 2021-11-26

## 2021-09-21 MED ORDER — ONDANSETRON HCL 4 MG PO TABS: 4.0000 mg | ORAL_TABLET | Freq: Three times a day (TID) | ORAL | 0 refills | Status: DC | PRN

## 2021-09-21 NOTE — Patient Instructions (Addendum)
F/U in 8 weeks , call if you  need me before  Mammogram to be scheduled at checkout  Shingrix #2 and flu vaccine today  You will be started on saxenda for weight loss, this is a daily injection, goal of 6 to 8 pound weight loss  Limit intake to 1200 to 1500 cal/ day  Acyclovir is prescribed for infection  Nurse please refill zofran  Ear flush right ear by nurse today  It is important that you exercise regularly at least 30 minutes 5 times a week. If you develop chest pain, have severe difficulty breathing, or feel very tired, stop exercising immediately and seek medical attention   Thanks for choosing [facility], we consider it a privelige to serve you.

## 2021-09-21 NOTE — Assessment & Plan Note (Signed)
UnControlled, not suicidal or homicidalManaged by Psych out of work x 3 months approx

## 2021-09-21 NOTE — Assessment & Plan Note (Signed)
Controlled, no change in medication DASH diet and commitment to daily physical activity for a minimum of 30 minutes discussed and encouraged, as a part of hypertension management. The importance of attaining a healthy weight is also discussed.  BP/Weight 09/21/2021 08/07/2021 06/19/2021 06/11/2021 05/08/2021 3/54/6568 11/20/7515  Systolic BP 001 749 449 675 916 384 665  Diastolic BP 71 84 84 88 97 90 98  Wt. (Lbs) 253.12 252.2 253 254 249 251.2 246  BMI 40.85 43.29 43.43 43.6 42.74 39.34 38.53  Some encounter information is confidential and restricted. Go to Review Flowsheets activity to see all data.

## 2021-09-21 NOTE — Assessment & Plan Note (Signed)
Acyclovir prescribed

## 2021-09-21 NOTE — Assessment & Plan Note (Signed)
Acyclovir is prescribed

## 2021-09-21 NOTE — Assessment & Plan Note (Signed)
Ear flush by LPN , successful

## 2021-09-21 NOTE — Assessment & Plan Note (Signed)
  Patient re-educated about  the importance of commitment to a  minimum of 150 minutes of exercise per week as able.  The importance of healthy food choices with portion control discussed, as well as eating regularly and within a 12 hour window most days. The need to choose "clean , green" food 50 to 75% of the time is discussed, as well as to make water the primary drink and set a goal of 64 ounces water daily.    Weight /BMI 09/21/2021 08/07/2021 06/19/2021  WEIGHT 253 lb 1.9 oz 252 lb 3.2 oz 253 lb  HEIGHT 5\' 6"  5\' 4"  5\' 4"   BMI 40.85 kg/m2 43.29 kg/m2 43.43 kg/m2  Some encounter information is confidential and restricted. Go to Review Flowsheets activity to see all data.    Will start med once abs are available

## 2021-09-21 NOTE — Progress Notes (Signed)
Olivia Woodward     MRN: [id number]      DOB: June 08, 1966   HPI Olivia Woodward is here for follow up and re-evaluation of chronic medical conditions, medication management and review of any available recent lab and radiology data.  Preventive health is updated, specifically  Cancer screening and Immunization.   Being treated by Psychiatry, still very depressed and anxious, recent med changes The PT denies any adverse reactions to current medications since the last visit.  Wants medication for weight loss, not exercising regualrly  C/o pressure in right ear and has been using q tip also  ROS Denies recent fever or chills. Denies sinus pressure, nasal congestion,  or sore throat. Denies chest congestion, productive cough or wheezing. Denies chest pains, palpitations and leg swelling Denies abdominal pain, nausea, vomiting,diarrhea or constipation.   Denies dysuria, frequency, hesitancy or incontinence. Denies joint pain, swelling and limitation in mobility. Denies headaches, seizures, numbness, or tingling. C/o  depression, anxiety and insomnia.Being treated by Psych, not suicidal or homicidal Denies skin break down or rash.   PE  BP 125/71   Pulse 94   Ht 5\' 6"  (1.676 m)   Wt 253 lb 1.9 oz (114.8 kg)   SpO2 (!) 82%   BMI 40.85 kg/m   Patient alert and oriented and in no cardiopulmonary distress.  HEENT: No facial asymmetry, EOMI,     Neck supple .Left TM clear, right partially occluded by wax , TM dull  Chest: Clear to auscultation bilaterally.  CVS: S1, S2 no murmurs, no S3.Regular rate.  ABD: Soft non tender.   Ext: No edema  MS: Adequate ROM spine, shoulders, hips and knees.  Skin: Intact, no ulcerations or rash noted.  Psych: Good eye contact, normal affect. Memory intact not anxious or depressed appearing.  CNS: CN 2-12 intact, power,  normal throughout.no focal deficits noted.   Assessment & Plan Morbid obesity [facility])  Patient re-educated about  the  importance of commitment to a  minimum of 150 minutes of exercise per week as able.  The importance of healthy food choices with portion control discussed, as well as eating regularly and within a 12 hour window most days. The need to choose "clean , green" food 50 to 75% of the time is discussed, as well as to make water the primary drink and set a goal of 64 ounces water daily.    Weight /BMI 09/21/2021 08/07/2021 06/19/2021  WEIGHT 253 lb 1.9 oz 252 lb 3.2 oz 253 lb  HEIGHT 5\' 6"  5\' 4"  5\' 4"   BMI 40.85 kg/m2 43.29 kg/m2 43.43 kg/m2  Some encounter information is confidential and restricted. Go to Review Flowsheets activity to see all data.    Will start med once abs are available  Essential hypertension Controlled, no change in medication DASH diet and commitment to daily physical activity for a minimum of 30 minutes discussed and encouraged, as a part of hypertension management. The importance of attaining a healthy weight is also discussed.  BP/Weight 09/21/2021 08/07/2021 06/19/2021 06/11/2021 05/08/2021 2/29/7989 12/20/1939  Systolic BP 740 814 481 856 314 970 263  Diastolic BP 71 84 84 88 97 90 98  Wt. (Lbs) 253.12 252.2 253 254 249 251.2 246  BMI 40.85 43.29 43.43 43.6 42.74 39.34 38.53  Some encounter information is confidential and restricted. Go to Review Flowsheets activity to see all data.       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

## 2021-09-25 ENCOUNTER — Telehealth: Payer: Self-pay

## 2021-09-25 DIAGNOSIS — F331 Major depressive disorder, recurrent, moderate: Secondary | ICD-10-CM

## 2021-09-25 MED ORDER — REXULTI 0.5 MG PO TABS: 0.5000 mg | ORAL_TABLET | Freq: Every day | ORAL | 0 refills | Status: DC

## 2021-09-25 NOTE — Telephone Encounter (Signed)
pt called left message that she is having some memory isses and that she wanted to speak with [name] about maybe the medication doing it or something.   I did send front desk a message to contact patient an set up appt for Saturday clinic since [name] is out of the office.

## 2021-09-25 NOTE — Telephone Encounter (Signed)
I have sent Rexulti to pharmacy.

## 2021-09-25 NOTE — Telephone Encounter (Signed)
received refill request for rexulti .5mg 

## 2021-09-25 NOTE — Telephone Encounter (Signed)
Agree with Saturday clinic visit

## 2021-09-29 ENCOUNTER — Telehealth (HOSPITAL_COMMUNITY): Payer: PRIVATE HEALTH INSURANCE | Admitting: Psychiatry

## 2021-09-29 ENCOUNTER — Other Ambulatory Visit: Payer: Self-pay

## 2021-10-02 ENCOUNTER — Telehealth: Payer: Self-pay

## 2021-10-02 NOTE — Telephone Encounter (Signed)
Patient called need prior authorization on meds before pharmacy will refill famotidine (PEPCID) 40 MG tablet   Pharmacy: [facility]

## 2021-10-03 ENCOUNTER — Ambulatory Visit (HOSPITAL_COMMUNITY): Payer: 59

## 2021-10-04 ENCOUNTER — Other Ambulatory Visit: Payer: Self-pay

## 2021-10-04 ENCOUNTER — Other Ambulatory Visit (HOSPITAL_COMMUNITY)
Admission: RE | Admit: 2021-10-04 | Discharge: 2021-10-04 | Disposition: A | Discharge status: Home / Self Care | Payer: 59 | Source: Ambulatory Visit | Attending: "Endocrinology | Admitting: "Endocrinology

## 2021-10-04 ENCOUNTER — Ambulatory Visit (INDEPENDENT_AMBULATORY_CARE_PROVIDER_SITE_OTHER): Payer: 59 | Admitting: Licensed Clinical Social Worker

## 2021-10-04 DIAGNOSIS — E0520 Thyrotoxicosis with toxic multinodular goiter without thyrotoxic crisis or storm: Secondary | ICD-10-CM | POA: Insufficient documentation

## 2021-10-04 DIAGNOSIS — F322 Major depressive disorder, single episode, severe without psychotic features: Secondary | ICD-10-CM

## 2021-10-04 LAB — TSH: TSH: 2.542 u[IU]/mL (ref 0.350–4.500)

## 2021-10-04 LAB — T4, FREE: Free T4: 0.94 ng/dL (ref 0.61–1.12)

## 2021-10-04 NOTE — Telephone Encounter (Signed)
Pt stated she needed a PA for Saxenda the PA has been started

## 2021-10-05 ENCOUNTER — Ambulatory Visit (INDEPENDENT_AMBULATORY_CARE_PROVIDER_SITE_OTHER): Payer: PRIVATE HEALTH INSURANCE | Admitting: "Endocrinology

## 2021-10-05 ENCOUNTER — Telehealth: Payer: Self-pay | Admitting: Family Medicine

## 2021-10-05 ENCOUNTER — Encounter: Payer: Self-pay | Admitting: "Endocrinology

## 2021-10-05 ENCOUNTER — Other Ambulatory Visit: Payer: Self-pay

## 2021-10-05 VITALS — BP 111/76 | HR 74 | Ht 64.0 in | Wt 251.2 lb

## 2021-10-05 DIAGNOSIS — E0520 Thyrotoxicosis with toxic multinodular goiter without thyrotoxic crisis or storm: Secondary | ICD-10-CM | POA: Diagnosis not present

## 2021-10-05 LAB — T3, FREE: T3, Free: 2.2 pg/mL (ref 2.0–4.4)

## 2021-10-05 NOTE — Telephone Encounter (Signed)
[employer] and they are going to fax another form for the info to be resubmitted and faxed back

## 2021-10-05 NOTE — Progress Notes (Signed)
10/05/2021, 2:39 PM  Endocrinology follow-up note   Subjective:    Patient ID: Olivia Woodward, female    DOB: Apr 13, 1966, PCP Fayrene Helper, MD   Past Medical History:  Diagnosis Date   Anemia    Anxiety    Phreesia 03/18/2021   Depression    Phreesia 03/18/2021   Generalized headaches    Hypertension    Past Surgical History:  Procedure Laterality Date   ABDOMINAL HYSTERECTOMY     fibroids, partial   BREAST REDUCTION SURGERY  2005   COLONOSCOPY WITH PROPOFOL N/A 05/10/2019   Procedure: COLONOSCOPY WITH PROPOFOL;  Surgeon: Daneil Dolin, MD;  Location: [facility];  Service: Endoscopy;  Laterality: N/A;  2:45pm   GASTRIC BYPASS  2007   POLYPECTOMY  05/10/2019   Procedure: POLYPECTOMY;  Surgeon: Daneil Dolin, MD;  Location: [facility];  Service: Endoscopy;;  colon   Social History   Socioeconomic History   Marital status: Single    Spouse name: Not on file   Number of children: Not on file   Years of education: Not on file   Highest education level: Not on file  Occupational History   Not on file  Tobacco Use   Smoking status: Never   Smokeless tobacco: Never  Vaping Use   Vaping Use: Never used  Substance and Sexual Activity   Alcohol use: No   Drug use: No   Sexual activity: Yes    Birth control/protection: Condom  Other Topics Concern   Not on file  Social History Narrative   Not on file   Social Determinants of Health   Financial Resource Strain: Not on file  Food Insecurity: Not on file  Transportation Needs: Not on file  Physical Activity: Not on file  Stress: Not on file  Social Connections: Not on file   Family History  Problem Relation Age of Onset   Hypertension Mother    Diabetes Mother    Drug abuse Mother    Hypertension Father    Hypertension Sister    Hypertension Sister    Colon cancer Neg Hx    Outpatient Encounter Medications as of 10/05/2021  Medication  Sig   acyclovir (ZOVIRAX) 800 MG tablet Take 1 tablet (800 mg total) by mouth 5 (five) times daily.   AIMOVIG 140 MG/ML SOAJ SMARTSIG:140 Milligram(s) SUB-Q Once a Month   albuterol (VENTOLIN HFA) 108 (90 Base) MCG/ACT inhaler Inhale 2 puffs into the lungs every 6 (six) hours as needed for wheezing or shortness of breath.   ALPRAZolam (XANAX) 0.25 MG tablet Take 1 tablet (0.25 mg total) by mouth 2 (two) times daily as needed for anxiety.   amLODipine (NORVASC) 10 MG tablet Take 1 tablet (10 mg total) by mouth daily.   ARIPiprazole (ABILIFY) 2 MG tablet Take 2 mg by mouth at bedtime.   Brexpiprazole (REXULTI) 0.5 MG TABS Take 1 tablet (0.5 mg total) by mouth daily.   Cyanocobalamin (B-12) 2500 MCG TABS Take 1 tablet by mouth daily.   cyclobenzaprine (FLEXERIL) 10 MG tablet Take one tablet by mouth at bedtime , for neck spasm   ergocalciferol (VITAMIN D2) 1.25 MG (50000 UT) capsule Take 1 capsule (50,000 Units  total) by mouth once a week. One capsule once weekly   escitalopram (LEXAPRO) 20 MG tablet Take 1 tablet (20 mg total) by mouth daily. Fill after 10/1   eszopiclone (LUNESTA) 2 MG TABS tablet Take 1 tablet (2 mg total) by mouth at bedtime as needed for sleep. Take immediately before bedtime   famotidine (PEPCID) 40 MG tablet Take 1 tablet (40 mg total) by mouth daily.   ibuprofen (ADVIL) 800 MG tablet Take 1 tablet (800 mg total) by mouth every 8 (eight) hours as needed.   ondansetron (ZOFRAN) 4 MG tablet Take 1 tablet (4 mg total) by mouth every 8 (eight) hours as needed.   rizatriptan (MAXALT) 10 MG tablet Take 1 tablet (10 mg total) by mouth as needed for migraine. May repeat in 2 hours if needed   spironolactone (ALDACTONE) 100 MG tablet Take 1 tablet (100 mg total) by mouth daily.   topiramate (TOPAMAX) 100 MG tablet Take 1 tablet (100 mg total) by mouth 2 (two) times daily.   Liraglutide -Weight Management (SAXENDA) 18 MG/3ML SOPN Inject 0.6 mg into the skin daily. (Patient not taking:  Reported on 10/05/2021)   [START ON 10/21/2021] Liraglutide -Weight Management (SAXENDA) 18 MG/3ML SOPN Inject 1.2 mg into the skin daily. (Patient not taking: Reported on 10/05/2021)   [START ON 11/20/2021] Liraglutide -Weight Management (SAXENDA) 18 MG/3ML SOPN Inject 1.8 mg into the skin daily. (Patient not taking: Reported on 10/05/2021)   promethazine-dextromethorphan (PROMETHAZINE-DM) 6.25-15 MG/5ML syrup Take 5 mLs by mouth 4 (four) times daily as needed for cough. (Patient not taking: Reported on 10/05/2021)   No facility-administered encounter medications on file as of 10/05/2021.   ALLERGIES: Allergies  Allergen Reactions   Ketorolac Tromethamine Hives    Lips swelled    VACCINATION STATUS: Immunization History  Administered Date(s) Administered   Influenza Whole 08/24/2009, 07/25/2010, 09/28/2016   Influenza, High Dose Seasonal PF 11/18/2020   Influenza,inj,Quad PF,6+ Mos 07/09/2018, 07/19/2019, 09/21/2021   Janssen (J&J) SARS-COV-2 Vaccination 01/30/2020, 11/18/2020   Td 07/25/2010   Zoster Recombinat (Shingrix) 05/08/2021, 09/21/2021    HPI Olivia Woodward is 55 y.o. female who presents today with a medical history as above. 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  She is status post RAI thyroid ablation on June 19, 2021.  Her labs on August 02, 2021 were consistent with euthyroid state.   PMD: Fayrene Helper, MD.  She feels better, has no new complaints today.  Her previsit thyroid function tests are consistent with euthyroid state.  She denies palpitations, tremors, nor heat intolerance. She denies any dysphagia, shortness of breath, nor voice change.  However, she feels dry mouth and dry throat.  Her repeat thyroid ultrasound showed decrease in the general size of her thyroid lobes bilaterally.  She did not have new findings.   She reports steady  weight over the last 3 visits.   Review of Systems  Constitutional:  no fatigue, no subjective hyperthermia, no subjective hypothermia   Objective:    Vitals with BMI 10/05/2021 09/21/2021 08/07/2021  Height 5\' 4"  5\' 6"  5\' 4"   Weight 251 lbs 3 oz 253 lbs 2 oz 252 lbs 3 oz  BMI 43.1 29.56 21.30  Systolic 865 784 696  Diastolic 76 71 84  Pulse 74 94 76  Some encounter information is confidential and restricted. Go to Review Flowsheets activity to see all data.  BP 111/76   Pulse 74   Ht 5\' 4"  (1.626 m)   Wt 251 lb 3.2 oz (113.9 kg)   BMI 43.12 kg/m   Wt Readings from Last 3 Encounters:  10/05/21 251 lb 3.2 oz (113.9 kg)  09/21/21 253 lb 1.9 oz (114.8 kg)  08/07/21 252 lb 3.2 oz (114.4 kg)    Physical Exam  Constitutional:  Body mass index is 43.12 kg/m.,  not in acute distress, normal state of mind Eyes: PERRLA, EOMI, no exophthalmos ENT: moist mucous membranes, + decrease in the size of her thyroid compared to last exam,  no gross cervical lymphadenopathy    CMP ( most recent) CMP     Component Value Date/Time   NA 138 03/22/2021 0930   K 4.7 03/22/2021 0930   CL 103 03/22/2021 0930   CO2 17 (L) 03/22/2021 0930   GLUCOSE 99 03/22/2021 0930   GLUCOSE 91 10/04/2020 1618   BUN 13 03/22/2021 0930   CREATININE 1.02 (H) 03/22/2021 0930   CREATININE 0.71 07/10/2018 1015   CALCIUM 9.4 03/22/2021 0930   PROT 7.6 03/22/2021 0930   ALBUMIN 3.9 03/22/2021 0930   AST 13 03/22/2021 0930   ALT 6 03/22/2021 0930   ALKPHOS 59 03/22/2021 0930   BILITOT 0.3 03/22/2021 0930   GFRNONAA >60 10/04/2020 1618   GFRNONAA 100 07/10/2018 1015   GFRAA 87 09/06/2020 0843   GFRAA 116 07/10/2018 1015     Diabetic Labs (most recent): Lab Results  Component Value Date   HGBA1C 5.4 03/22/2021   HGBA1C 5.3 07/26/2010     Lipid Panel ( most recent) Lipid Panel     Component Value Date/Time   CHOL 164 03/22/2021 0930   TRIG 73 03/22/2021 0930   HDL 69 03/22/2021 0930    CHOLHDL 2.4 03/22/2021 0930   CHOLHDL 2.0 12/27/2017 0949   VLDL 10 01/02/2017 1536   LDLCALC 81 03/22/2021 0930   LDLCALC 71 12/27/2017 0949   LABVLDL 14 03/22/2021 0930      Lab Results  Component Value Date   TSH 2.542 10/04/2021   TSH 0.820 08/02/2021   TSH 0.409 (L) 03/22/2021   TSH 0.278 (L) 09/06/2020   TSH 0.279 (L) 09/06/2020   TSH 0.375 11/19/2018   TSH 0.339 (L) 10/26/2018   TSH 0.73 12/27/2017   TSH 0.58 01/02/2017   TSH 0.912 12/28/2008   FREET4 0.94 10/04/2021   FREET4 0.74 08/02/2021   FREET4 1.12 09/06/2020   FREET4 CANCELED 10/26/2018   FREET4 1.10 07/26/2010      Assessment & Plan:   1.  Toxic multinodular goiter 2. Subclinical hyperthyroidism-resolved She is status post RAI ablation of toxic thyroid nodule on the left lobe on June 19, 2021.  Her previsit thyroid function tests remain consistent with euthyroid state.  She will not need initiation of thyroid hormone  as of yet.  It is unlikely that she would avoid thyroid hormone replacement in the long run.  She has history of benign FNA from one of the right sided nodules in 2021.  Her most recent thyroid ultrasound confirmed shrinking in the size of her thyroid generally, and did not reveal any new concerning thyroid nodules.  She will not need surgical intervention at this time.   - she is advised to maintain close follow up with Fayrene Helper, MD for primary care needs.    I spent 21 minutes in the care of the patient today including review of labs from Thyroid Function, CMP, and  other relevant labs ; imaging/biopsy records (current and previous including abstractions from other facilities); face-to-face time discussing  her lab results and symptoms, medications doses, her options of short and long term treatment based on the latest standards of care / guidelines;   and documenting the encounter.  Iran Ouch Pfund  participated in the discussions, expressed understanding, and voiced agreement  with the above plans.  All questions were answered to her satisfaction. she is encouraged to contact clinic should she have any questions or concerns prior to her return visit.   Follow up plan: Return in about 6 months (around 04/04/2022) for F/U with Pre-visit Labs.   Glade Lloyd, MD [facility] [address] [place], [place] [address] Phone: [phone]  Fax: [phone]     10/05/2021, 2:39 PM  This note was partially dictated with voice recognition software. Similar sounding words can be transcribed inadequately or may not  be corrected upon review.

## 2021-10-05 NOTE — Telephone Encounter (Signed)
Notified that a new form would be completed

## 2021-10-05 NOTE — Telephone Encounter (Signed)
Pt called In to see if injection got approved

## 2021-10-05 NOTE — Telephone Encounter (Signed)
It was not approved- what do you want to do now?

## 2021-10-09 ENCOUNTER — Telehealth: Payer: Self-pay

## 2021-10-09 NOTE — Telephone Encounter (Signed)
Patient returning nurse call. Call back # [phone].

## 2021-10-09 NOTE — Telephone Encounter (Signed)
Told pt we are waiting on the PA and she understood

## 2021-10-09 NOTE — Telephone Encounter (Signed)
Patient called asked for [facility] return her call.  Call back # [phone].

## 2021-10-10 ENCOUNTER — Ambulatory Visit (INDEPENDENT_AMBULATORY_CARE_PROVIDER_SITE_OTHER): Payer: 59 | Admitting: Licensed Clinical Social Worker

## 2021-10-10 DIAGNOSIS — F322 Major depressive disorder, single episode, severe without psychotic features: Secondary | ICD-10-CM

## 2021-10-10 NOTE — Telephone Encounter (Signed)
Patient called asked for [facility] return her call.  Call back # [phone].

## 2021-10-10 NOTE — Telephone Encounter (Signed)
Patient called asked for [facility] return her call.  Call back # [phone].

## 2021-10-11 ENCOUNTER — Telehealth (INDEPENDENT_AMBULATORY_CARE_PROVIDER_SITE_OTHER): Payer: 59 | Admitting: Licensed Clinical Social Worker

## 2021-10-11 DIAGNOSIS — F322 Major depressive disorder, single episode, severe without psychotic features: Secondary | ICD-10-CM

## 2021-10-11 NOTE — BH Specialist Note (Signed)
[place] Follow Up Assessment  MRN: [id number] NAME: Olivia Woodward Date: 10/11/21  Start time: 15p End time: 341p Total time: 20  Type of Contact: Follow up Call  Current concerns/stressors: Memory loss  Screens/Assessment Tools:  GAD 7 : Generalized Anxiety Score 08/17/2021 06/26/2021 06/13/2021 05/04/2021  Nervous, Anxious, on Edge 2 2 2 2   Control/stop worrying 2 2 2 2   Worry too much - different things 2 2 2 2   Trouble relaxing 2 2 2 2   Restless 2 2 2 1   Easily annoyed or irritable 2 2 2 2   Afraid - awful might happen 1 2 1 1   Total GAD 7 Score 13 14 13 12   Anxiety Difficulty Very difficult Somewhat difficult Very difficult Very difficult    PHQ9 SCORE ONLY 09/21/2021 09/19/2021 09/07/2021  PHQ-9 Total Score 2 18 0     Functional Assessment:  Sleep: poor Appetite: poor Coping ability: exhausted Patient taking medications as prescribed:    Current medications:  Outpatient Encounter Medications as of 10/11/2021  Medication Sig   acyclovir (ZOVIRAX) 800 MG tablet Take 1 tablet (800 mg total) by mouth 5 (five) times daily.   AIMOVIG 140 MG/ML SOAJ SMARTSIG:140 Milligram(s) SUB-Q Once a Month   albuterol (VENTOLIN HFA) 108 (90 Base) MCG/ACT inhaler Inhale 2 puffs into the lungs every 6 (six) hours as needed for wheezing or shortness of breath.   ALPRAZolam (XANAX) 0.25 MG tablet Take 1 tablet (0.25 mg total) by mouth 2 (two) times daily as needed for anxiety.   amLODipine (NORVASC) 10 MG tablet Take 1 tablet (10 mg total) by mouth daily.   ARIPiprazole (ABILIFY) 2 MG tablet Take 2 mg by mouth at bedtime.   Brexpiprazole (REXULTI) 0.5 MG TABS Take 1 tablet (0.5 mg total) by mouth daily.   Cyanocobalamin (B-12) 2500 MCG TABS Take 1 tablet by mouth daily.   cyclobenzaprine (FLEXERIL) 10 MG tablet Take one tablet by mouth at bedtime , for neck spasm   ergocalciferol (VITAMIN D2) 1.25 MG (50000 UT) capsule Take 1 capsule (50,000 Units total) by mouth once a week.  One capsule once weekly   escitalopram (LEXAPRO) 20 MG tablet Take 1 tablet (20 mg total) by mouth daily. Fill after 10/1   eszopiclone (LUNESTA) 2 MG TABS tablet Take 1 tablet (2 mg total) by mouth at bedtime as needed for sleep. Take immediately before bedtime   famotidine (PEPCID) 40 MG tablet Take 1 tablet (40 mg total) by mouth daily.   ibuprofen (ADVIL) 800 MG tablet Take 1 tablet (800 mg total) by mouth every 8 (eight) hours as needed.   Liraglutide -Weight Management (SAXENDA) 18 MG/3ML SOPN Inject 0.6 mg into the skin daily. (Patient not taking: Reported on 10/05/2021)   [START ON 10/21/2021] Liraglutide -Weight Management (SAXENDA) 18 MG/3ML SOPN Inject 1.2 mg into the skin daily. (Patient not taking: Reported on 10/05/2021)   [START ON 11/20/2021] Liraglutide -Weight Management (SAXENDA) 18 MG/3ML SOPN Inject 1.8 mg into the skin daily. (Patient not taking: Reported on 10/05/2021)   ondansetron (ZOFRAN) 4 MG tablet Take 1 tablet (4 mg total) by mouth every 8 (eight) hours as needed.   promethazine-dextromethorphan (PROMETHAZINE-DM) 6.25-15 MG/5ML syrup Take 5 mLs by mouth 4 (four) times daily as needed for cough. (Patient not taking: Reported on 10/05/2021)   rizatriptan (MAXALT) 10 MG tablet Take 1 tablet (10 mg total) by mouth as needed for migraine. May repeat in 2 hours if needed   spironolactone (ALDACTONE) 100 MG tablet  Take 1 tablet (100 mg total) by mouth daily.   topiramate (TOPAMAX) 100 MG tablet Take 1 tablet (100 mg total) by mouth 2 (two) times daily.   No facility-administered encounter medications on file as of 10/11/2021.    Self-harm and/or Suicidal Behaviors Risk Assessment Self-harm risk factors: no Patient endorses recent self injurious thoughts and/or behaviors: No   Suicide ideations: No plan to harm self or others   Danger to Others Risk Assessment Danger to others risk factors: no Patient endorses recent thoughts of harming others: No    Substance Use  Assessment Patient recently consumed alcohol: No  Patient recently used drugs: No  Patient is concerned about dependence or abuse of substances: No    Goals, Interventions and Follow-up Plan Goals: Increase healthy adjustment to current life circumstances Interventions: Mindfulness or Relaxation Training, Behavioral Activation, and CBT Cognitive Behavioral Therapy   Summary of Clinical Assessment  Annell contacted [facility] & requested a call back from [place]. She reports taht she is most concerned with her memory.  She has been out in the community driving and had to pull over, she was unsure of her where she was going.  Reports that this has occurred more than once.  She continues to report blurry vision and nausea.  Factors that contribute to client's ongoing depressive symptoms were discussed and include real and perceived feelings of isolation, criticism, rejection, shame and guilt. Patient has a desire to return back to work, reports that financially she requires assistance.  No plans for the holiday and she lacks motivation, energy and has decreased concentration.  Writer will contact the [place] at [place] to discuss short term disability for Patient. Weekly visits with Patient with [facility].   Follow-up Plan:  Weekly [facility] session [place], LCSW

## 2021-10-11 NOTE — Progress Notes (Signed)
Patient requested documentation

## 2021-10-12 ENCOUNTER — Other Ambulatory Visit: Payer: Self-pay | Admitting: Psychiatry

## 2021-10-12 DIAGNOSIS — F419 Anxiety disorder, unspecified: Secondary | ICD-10-CM

## 2021-10-13 NOTE — BH Specialist Note (Signed)
Patient answered when Clinician called and stated that she was at a doc appointment. She will call back

## 2021-10-15 NOTE — Progress Notes (Signed)
Virtual Visit via Video Note  I connected with Olivia Woodward on 10/17/21 at  9:30 AM EST by a video enabled telemedicine application and verified that I am speaking with the correct person using two identifiers.  Location: Patient: home Provider: office Persons participated in the visit- patient, provider    I discussed the limitations of evaluation and management by telemedicine and the availability of in person appointments. The patient expressed understanding and agreed to proceed.    I discussed the assessment and treatment plan with the patient. The patient was provided an opportunity to ask questions and all were answered. The patient agreed with the plan and demonstrated an understanding of the instructions.   The patient was advised to call back or seek an in-person evaluation if the symptoms worsen or if the condition fails to improve as anticipated.  I provided 13 minutes of non-face-to-face time during this encounter.   Olivia Clay, MD    [facility] MD/PA/NP OP Progress Note  10/17/2021 10:10 AM Olivia Woodward  MRN:  [id number]  Chief Complaint:  Chief Complaint   Follow-up; Depression    HPI:  This is a follow-up appointment for depression.  She states that she is not doing so good.  She has significant issues with memory.  She talks about an episode of her going to [place].  She could not remember why she went to [facility].  She was scared to death.  She also could not tell how to get back home.  She called her daughter to bring her back home.  She does not go outside since then as she is scared of this.  She feels tired and drained.  She always need to find to get things done.  She has insomnia.  She feels depressed and anxious. She has poor appetite.  She denies SI.  She wonders if she needs to get had images to be taken due to her memory loss.  She agrees to discuss this with upcoming appointment with neurologist.   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 (9 year old daughter, and 2 yo son, going to college in 2022) Education: graduated from [employer], attended [employer]  Visit Diagnosis:    ICD-10-CM   1. Anxiety  F41.9     2. Moderate episode of recurrent major depressive disorder (HCC)  F33.1     3. Insomnia, unspecified type  G47.00       Past Psychiatric History: Please see initial evaluation for full details. I have reviewed the history. No updates at this time.     Past Medical History:  Past Medical History:  Diagnosis Date   Anemia    Anxiety    Phreesia 03/18/2021   Depression    Phreesia 03/18/2021   Generalized headaches    Hypertension     Past Surgical History:  Procedure Laterality Date   ABDOMINAL HYSTERECTOMY     fibroids, partial   BREAST REDUCTION SURGERY  2005   COLONOSCOPY WITH PROPOFOL N/A 05/10/2019   Procedure: COLONOSCOPY WITH PROPOFOL;  Surgeon: Daneil Dolin, MD;  [facility];  Service: Endoscopy;  Laterality: N/A;  2:45pm   GASTRIC BYPASS  2007   POLYPECTOMY  05/10/2019   Procedure: POLYPECTOMY;  Surgeon: Daneil Dolin, MD;  Location: [facility];  Service: Endoscopy;;  colon    Family Psychiatric History:  Please see initial evaluation for full details. I have reviewed the history. No updates at this time.     Family History:  Family History  Problem Relation Age of Onset   Hypertension Mother    Diabetes Mother    Drug abuse Mother    Hypertension Father    Hypertension Sister    Hypertension Sister    Colon cancer Neg Hx     Social History:  Social History   Socioeconomic History   Marital status: Single    Spouse name: Not on file   Number of children: Not on file   Years of education: Not on file   Highest education level: Not on file  Occupational History   Not on  file  Tobacco Use   Smoking status: Never   Smokeless tobacco: Never  Vaping Use   Vaping Use: Never used  Substance and Sexual Activity   Alcohol use: No   Drug use: No   Sexual activity: Yes    Birth control/protection: Condom  Other Topics Concern   Not on file  Social History Narrative   Not on file   Social Determinants of Health   Financial Resource Strain: Not on file  Food Insecurity: Not on file  Transportation Needs: Not on file  Physical Activity: Not on file  Stress: Not on file  Social Connections: Not on file    Allergies:  Allergies  Allergen Reactions   Ketorolac Tromethamine Hives    Lips swelled    Metabolic Disorder Labs: Lab Results  Component Value Date   HGBA1C 5.4 03/22/2021   No results found for: PROLACTIN Lab Results  Component Value Date   CHOL 164 03/22/2021   TRIG 73 03/22/2021   HDL 69 03/22/2021   CHOLHDL 2.4 03/22/2021   VLDL 10 01/02/2017   LDLCALC 81 03/22/2021   LDLCALC 124 (H) 09/06/2020   Lab Results  Component Value Date   TSH 2.542 10/04/2021   TSH 0.820 08/02/2021    Therapeutic Level Labs: No results found for: LITHIUM No results found for: VALPROATE No components found for:  CBMZ  Current Medications: Current Outpatient Medications  Medication Sig Dispense Refill   [START ON 10/31/2021] brexpiprazole (REXULTI) 1 MG TABS tablet Take 1 tablet (1 mg total) by mouth daily. 30 tablet 0   acyclovir (ZOVIRAX) 800 MG tablet Take 1 tablet (800 mg total) by mouth 5 (five) times daily. 21 tablet 0   AIMOVIG 140 MG/ML SOAJ SMARTSIG:140 Milligram(s) SUB-Q Once a Month     albuterol (VENTOLIN HFA) 108 (90 Base) MCG/ACT inhaler Inhale 2 puffs into the lungs every 6 (six) hours as needed for wheezing or shortness of breath. 18 g 0   ALPRAZolam (XANAX) 0.25 MG tablet Take 1 tablet (0.25 mg total) by mouth 2 (two) times daily as needed for anxiety. 60 tablet 1   amLODipine (NORVASC) 10 MG tablet Take 1 tablet (10 mg total) by  mouth daily. 90 tablet 3   Brexpiprazole (REXULTI) 0.5 MG TABS Take 1 tablet (0.5 mg total) by mouth daily. 30 tablet 0   Cyanocobalamin (B-12) 2500 MCG TABS Take 1 tablet by mouth daily.     cyclobenzaprine (FLEXERIL) 10 MG tablet Take one tablet by mouth at bedtime , for neck spasm 30 tablet 0   ergocalciferol (VITAMIN D2) 1.25 MG (50000 UT) capsule Take 1 capsule (50,000 Units total) by mouth once a week. One capsule once weekly 12 capsule 2   escitalopram (LEXAPRO) 20 MG tablet Take  1 tablet (20 mg total) by mouth daily. Fill after 10/1 30 tablet 2   [START ON 11/07/2021] eszopiclone (LUNESTA) 2 MG TABS tablet Take 1 tablet (2 mg total) by mouth at bedtime as needed for sleep. Take immediately before bedtime 30 tablet 2   famotidine (PEPCID) 40 MG tablet Take 1 tablet (40 mg total) by mouth daily. 30 tablet 0   ibuprofen (ADVIL) 800 MG tablet Take 1 tablet (800 mg total) by mouth every 8 (eight) hours as needed. 30 tablet 0   Liraglutide -Weight Management (SAXENDA) 18 MG/3ML SOPN Inject 0.6 mg into the skin daily. (Patient not taking: Reported on 10/05/2021) 3 mL 1   [START ON 10/21/2021] Liraglutide -Weight Management (SAXENDA) 18 MG/3ML SOPN Inject 1.2 mg into the skin daily. (Patient not taking: Reported on 10/05/2021) 3 mL 0   [START ON 11/20/2021] Liraglutide -Weight Management (SAXENDA) 18 MG/3ML SOPN Inject 1.8 mg into the skin daily. (Patient not taking: Reported on 10/05/2021) 3 mL 0   ondansetron (ZOFRAN) 4 MG tablet Take 1 tablet (4 mg total) by mouth every 8 (eight) hours as needed. 20 tablet 0   promethazine-dextromethorphan (PROMETHAZINE-DM) 6.25-15 MG/5ML syrup Take 5 mLs by mouth 4 (four) times daily as needed for cough. (Patient not taking: Reported on 10/05/2021) 118 mL 0   rizatriptan (MAXALT) 10 MG tablet Take 1 tablet (10 mg total) by mouth as needed for migraine. May repeat in 2 hours if needed 10 tablet 0   spironolactone (ALDACTONE) 100 MG tablet Take 1 tablet (100 mg total)  by mouth daily. 30 tablet 3   topiramate (TOPAMAX) 100 MG tablet Take 1 tablet (100 mg total) by mouth 2 (two) times daily. 60 tablet 3   No current facility-administered medications for this visit.     Musculoskeletal: Strength & Muscle Tone:  N/A Gait & Station:  N/A Patient leans: N/A  Psychiatric Specialty Exam: Review of Systems  Psychiatric/Behavioral:  Positive for confusion, decreased concentration, dysphoric mood and sleep disturbance. Negative for agitation, behavioral problems, hallucinations, self-injury and suicidal ideas. The patient is nervous/anxious. The patient is not hyperactive.   All other systems reviewed and are negative.  There were no vitals taken for this visit.There is no height or weight on file to calculate BMI.  General Appearance: Fairly Groomed  Eye Contact:  Good  Speech:  Clear and Coherent  Volume:  Normal  Mood:   drained  Affect:  Appropriate, Congruent, and Restricted, labile  Thought Process:  Coherent  Orientation:  Full (Time, Place, and Person)  Thought Content: Logical   Suicidal Thoughts:  No  Homicidal Thoughts:  No  Memory:  Immediate;   Good  Judgement:  Good  Insight:  Present  Psychomotor Activity:  Normal  Concentration:  Concentration: Poor and Attention Span: Poor  Recall:  Good  Fund of Knowledge: Good  Language: Good  Akathisia:  No  Handed:  Right  AIMS (if indicated): not done  Assets:  Communication Skills Desire for Improvement  ADL's:  Intact  Cognition: WNL  Sleep:  Poor   Screenings: GAD-7    Flowsheet Row Office Visit from 08/16/2021 in [facility] Phone Follow Up from 06/26/2021 in [facility] Video Visit from 06/12/2021 in [facility] Video Visit from 05/04/2021 in [facility] Office Visit from 03/17/2019 in [facility]  Total GAD-7 Score 13 14 13 12 5       PHQ2-9    [place] Office Visit  from 09/21/2021 in  [facility] Office Visit from 09/13/2021 in [facility] Office Visit from 09/07/2021 in [facility] Office Visit from 08/29/2021 in [facility] Visit from 08/16/2021 in [facility]  PHQ-2 Total Score 2 5 0 6 6  PHQ-9 Total Score -- 18 0 19 21      Flowsheet Row Video Visit from 08/31/2021 in [facility] Video Visit from 07/27/2021 in [facility] Office Visit from 06/19/2021 in [facility] Error: Q3, 4, or 5 should not be populated when Q2 is No Error: Question 6 not populated Error: Q3, 4, or 5 should not be populated when Q2 is No        Assessment and Plan:  Olivia Woodward is a 55 y.o. year old female with a history of depression, iron deficiency anemia,  multinodular goiter, subclinical hyperthyroidism, vitamin D deficiency, hypertension, s/p gastric bypass surgery in 2017, who presents for follow up appointment for below.    1. Moderate episode of recurrent major depressive disorder ([place]) 2. Anxiety Exam is notable for rumination on her memory loss, and she continues to report depressive symptoms since the last visit. Psychosocial stressors includes conflict with her children, and the recent loss of her aunt.  Will uptitrate rexulti to optimize treatment for depression.  Discussed potential metabolic side effect and EPS.  Will continue Lexapro to target depression and anxiety.  Will continue Xanax as needed for anxiety.   3. Insomnia, unspecified type Poor.  She has an upcoming appointment for sleep evaluation given her snoring, fatigue and middle insomnia.  Will continue current dose of Lunesta to target insomnia.   # Memory loss She complains of memory loss, and wonders if any head imaging needs to be done.  She has an upcoming appointment with neurologist; she was advised to discuss this with the provider for father evaluation,  although her symptoms is likely more attributable to depression.   This clinician has discussed the side effect associated with medication prescribed during this encounter. Please refer to notes in the previous encounters for more details.     Plan Continue lexapro 20 mg daily Increase rexulti 1 mg daily Continue Lunesta 2 mg at night as needed for sleep Continue Xanax 0.25 mg in AM (used to be on 1 mg at night ) Next appointment: 1/9 at 3 PM for 30 mins, in person   I would support that she would remain out from work with expected return date on Feb 2nd.  She has depressive symptoms with significant impairment in cognition/memory and fatigue, which interferes with her ability to work at her capacity.      Past trials of medication: sertraline fluoxetine, lexapro, venlafaxine ("funny"), bupropion (dry mouth, nausea), quetiapine (drowsiness), Abilify (nausea, constipation, tinnitus), Xanax, temazepam, Trazodone, Ambien, Belsomra (could not afford)   The patient demonstrates the following risk factors for suicide: Chronic risk factors for suicide include: psychiatric disorder of depression. Acute risk factors for suicide include: loss (financial, interpersonal, professional). Protective factors for this patient include: responsibility to others (children, family), coping skills, hope for the future and religious beliefs against suicide. Considering these factors, the overall suicide risk at this point appears to be low. Patient is appropriate for outpatient follow up.  Olivia Clay, MD 10/17/2021, 10:10 AM

## 2021-10-16 ENCOUNTER — Other Ambulatory Visit: Payer: Self-pay | Admitting: Psychiatry

## 2021-10-16 ENCOUNTER — Telehealth: Payer: Self-pay

## 2021-10-16 ENCOUNTER — Telehealth: Payer: Self-pay | Admitting: Family Medicine

## 2021-10-16 DIAGNOSIS — F419 Anxiety disorder, unspecified: Secondary | ICD-10-CM

## 2021-10-16 MED ORDER — ALPRAZOLAM 0.25 MG PO TABS: 0.2500 mg | ORAL_TABLET | Freq: Two times a day (BID) | ORAL | 1 refills | Status: DC | PRN

## 2021-10-16 NOTE — Telephone Encounter (Signed)
Ordered.   I have utilized the [place] Controlled Substances Reporting System (PMP AWARxE) to confirm adherence regarding the patient's medication. My review reveals appropriate prescription fills.

## 2021-10-16 NOTE — Telephone Encounter (Signed)
pt called states that she needs the xanax .25 sent in she is completely out

## 2021-10-16 NOTE — Telephone Encounter (Signed)
error 

## 2021-10-17 ENCOUNTER — Encounter: Payer: Self-pay | Admitting: Psychiatry

## 2021-10-17 ENCOUNTER — Other Ambulatory Visit: Payer: Self-pay

## 2021-10-17 ENCOUNTER — Telehealth (INDEPENDENT_AMBULATORY_CARE_PROVIDER_SITE_OTHER): Payer: 59 | Admitting: Psychiatry

## 2021-10-17 DIAGNOSIS — F331 Major depressive disorder, recurrent, moderate: Secondary | ICD-10-CM | POA: Diagnosis not present

## 2021-10-17 DIAGNOSIS — F419 Anxiety disorder, unspecified: Secondary | ICD-10-CM

## 2021-10-17 DIAGNOSIS — G4700 Insomnia, unspecified: Secondary | ICD-10-CM | POA: Diagnosis not present

## 2021-10-17 MED ORDER — BREXPIPRAZOLE 1 MG PO TABS: 1.0000 mg | ORAL_TABLET | Freq: Every day | ORAL | 0 refills | Status: DC

## 2021-10-17 MED ORDER — ESZOPICLONE 2 MG PO TABS: 2.0000 mg | ORAL_TABLET | Freq: Every evening | ORAL | 2 refills | Status: DC | PRN

## 2021-10-17 NOTE — Patient Instructions (Signed)
Continue lexapro 20 mg daily Increase rexulti 1 mg daily Continue Lunesta 2 mg at night as needed for sleep Continue Xanax 0.25 mg in AM  Next appointment: 1/9 at 3 PM, in person  The next visit will be in person visit. Please arrive 15 mins before the scheduled time.   [facility]  Address: [address], [place], [place] [address]

## 2021-10-21 ENCOUNTER — Other Ambulatory Visit: Payer: Self-pay | Admitting: Family Medicine

## 2021-10-23 ENCOUNTER — Other Ambulatory Visit: Payer: Self-pay

## 2021-10-23 ENCOUNTER — Encounter: Payer: Self-pay | Admitting: Neurology

## 2021-10-23 ENCOUNTER — Ambulatory Visit (INDEPENDENT_AMBULATORY_CARE_PROVIDER_SITE_OTHER): Payer: PRIVATE HEALTH INSURANCE | Admitting: Neurology

## 2021-10-23 VITALS — BP 105/70 | HR 63 | Ht 64.0 in | Wt 252.0 lb

## 2021-10-23 DIAGNOSIS — F99 Mental disorder, not otherwise specified: Secondary | ICD-10-CM

## 2021-10-23 DIAGNOSIS — Z85850 Personal history of malignant neoplasm of thyroid: Secondary | ICD-10-CM

## 2021-10-23 DIAGNOSIS — G44019 Episodic cluster headache, not intractable: Secondary | ICD-10-CM

## 2021-10-23 DIAGNOSIS — F331 Major depressive disorder, recurrent, moderate: Secondary | ICD-10-CM

## 2021-10-23 DIAGNOSIS — F5105 Insomnia due to other mental disorder: Secondary | ICD-10-CM | POA: Insufficient documentation

## 2021-10-23 DIAGNOSIS — D508 Other iron deficiency anemias: Secondary | ICD-10-CM

## 2021-10-23 DIAGNOSIS — G9332 Myalgic encephalomyelitis/chronic fatigue syndrome: Secondary | ICD-10-CM | POA: Diagnosis not present

## 2021-10-23 DIAGNOSIS — Z6841 Body Mass Index (BMI) 40.0 and over, adult: Secondary | ICD-10-CM

## 2021-10-23 NOTE — Progress Notes (Signed)
SLEEP MEDICINE CLINIC    Provider:  Larey Seat, MD  Primary Care Physician:  Fayrene Helper, MD [address] [place] [address]     Referring Provider: Modesta Messing, MD         Chief Complaint according to patient   Patient presents with:     New Patient (Initial Visit)           HISTORY OF PRESENT ILLNESS:  Olivia Woodward is a 55 y.o. [facility] patient seen here in a consultation on 10/23/2021 from Dr Modesta Messing .   10-23-2021:  Chief concern according to patient :   Internal referral for 10 years plus history of insomnia.  No prior sleep study. Fatigued when she wakes up, tired throughout the day. Woken by headaches, cluster type- has migraines, too-Wakes up with headaches. No snoring.    I have the pleasure of seeing Olivia Woodward , a right -handed African American female with Insomnia, unclear if any organic sleep disorder is present.  She  has a poorly abstracted medical history of iron deficiency Anemia, GAD-Anxiety,  chronic Depression, Migraine and Cluster , and Hypertension. This patient has thyroid cancer and completed radiation therapy, this was not entered into her Med Hx . Seasonal allergies.status post  gastric bypass- since 2010.    Sleep relevant medical history: The patient was diagnosed with thyroid cancer and has undergone radiation therapy.  She is due for a revisit with her oncologist in 6 months.  She is truly feels that the thyroid disease itself has also contributed to a level of fatigue that was not seen before.  She has struggled with depression and that certainly also manifests in poor sleep quality and less restorative sleep.  She is not aware if she snores.  Her daughter sleeps close to her and has never mentioned snoring.  She indicates to bathroom breaks at night, not excessive.  Her average hours of sleep at night are 4 to 5 hours. Her main problem is to initiate sleep and she reports being able to stay up at night until the early  morning hours and then will sleep only 4 or 5 hours. Almost reversed circadian rhythm.   Family medical /sleep history: no other family member on CPAP with OSA, insomnia, sleep walkers.    Social history:  Patient is working as Publishing copy. and lives in a household with daughter, son in college.  No pets.  Tobacco use- none.  ETOH use none ,  Caffeine intake in form of Coffee( ) Soda( 2-3 a week/ mt dew) Tea ( only when eating out) or energy drinks. Regular exercise in form of - no energy.   Hobbies :no energy. Excessive fatigue.       Sleep habits are as follows: The patient's dinner time is 5 PM. The patient goes to bed at 9 PM and continues to be awake until midnight or longer- then sleep for 4-5 hours, wakes for 2 bathroom breaks.   The preferred sleep position is sideways, with the support of 1 pillows. Elevated head of bed- 12-15 degrees. Dreams are reportedly rare.  7.30  AM is the usual rise time. The patient wakes up spontaneously much earlier. Work starts at 61 AM. Works from home.  She reports not feeling refreshed or restored in AM, with symptoms such as dry mouth, morning headaches, and residual fatigue. Naps are taken infrequently,can't sleep-   Review of Systems: Out of a complete 14 system  review, the patient complains of only the following symptoms, and all other reviewed systems are negative.:  Fatigue, sleepiness , snoring, nocturia fragmented sleep, Insomnia - CHRONIC, sleep initiation-    How likely are you to doze in the following situations: 0 = not likely, 1 = slight chance, 2 = moderate chance, 3 = high chance   Sitting and Reading? Watching Television? Sitting inactive in a public place (theater or meeting)? As a passenger in a car for an hour without a break? Lying down in the afternoon when circumstances permit? Sitting and talking to someone? Sitting quietly after lunch without alcohol? In a car, while stopped for a few minutes in traffic?   Total  = 3/ 24 points   FSS endorsed at 45/ 63 points.   DEPRESSION< ANXIETY CHONIC FATIGUE< CLUSTER HEADACHES>   Social History   Socioeconomic History   Marital status: Single    Spouse name: Not on file   Number of children: 2   Years of education: 12   Highest education level: Not on file  Occupational History   Not on file  Tobacco Use   Smoking status: Never   Smokeless tobacco: Never  Vaping Use   Vaping Use: Never used  Substance and Sexual Activity   Alcohol use: No   Drug use: No   Sexual activity: Yes    Birth control/protection: Condom  Other Topics Concern   Not on file  Social History Narrative   Right handed   Caffeine use: tea/soda twice weekly   Social Determinants of Health   Financial Resource Strain: Not on file  Food Insecurity: Not on file  Transportation Needs: Not on file  Physical Activity: Not on file  Stress: Not on file  Social Connections: Not on file    Family History  Problem Relation Age of Onset   Hypertension Mother    Diabetes Mother    Drug abuse Mother    Hypertension Father    Hypertension Sister    Hypertension Sister    Colon cancer Neg Hx     Past Medical History:  Diagnosis Date   Anemia    Anxiety    Phreesia 03/18/2021   Depression    Phreesia 03/18/2021   Generalized headaches    Hypertension     Past Surgical History:  Procedure Laterality Date   ABDOMINAL HYSTERECTOMY     fibroids, partial   BREAST REDUCTION SURGERY  2005   COLONOSCOPY WITH PROPOFOL N/A 05/10/2019   Procedure: COLONOSCOPY WITH PROPOFOL;  Surgeon: Daneil Dolin, MD;  Location: [facility];  Service: Endoscopy;  Laterality: N/A;  2:45pm   GASTRIC BYPASS  2007   POLYPECTOMY  05/10/2019   Procedure: POLYPECTOMY;  Surgeon: Daneil Dolin, MD;  Location: [facility];  Service: Endoscopy;;  colon     Current Outpatient Medications on File Prior to Visit  Medication Sig Dispense Refill   AIMOVIG 140 MG/ML SOAJ SMARTSIG:140 Milligram(s)  SUB-Q Once a Month     albuterol (VENTOLIN HFA) 108 (90 Base) MCG/ACT inhaler Inhale 2 puffs into the lungs every 6 (six) hours as needed for wheezing or shortness of breath. 18 g 0   ALPRAZolam (XANAX) 0.25 MG tablet Take 1 tablet (0.25 mg total) by mouth 2 (two) times daily as needed for anxiety. 60 tablet 1   amLODipine (NORVASC) 10 MG tablet Take 1 tablet (10 mg total) by mouth daily. 90 tablet 3   [START ON 10/31/2021] brexpiprazole (REXULTI) 1 MG TABS tablet  Take 1 tablet (1 mg total) by mouth daily. 30 tablet 0   Cyanocobalamin (B-12 PO) Take 2,500 mcg by mouth daily.     ergocalciferol (VITAMIN D2) 1.25 MG (50000 UT) capsule Take 1 capsule (50,000 Units total) by mouth once a week. One capsule once weekly 12 capsule 2   escitalopram (LEXAPRO) 20 MG tablet Take 1 tablet (20 mg total) by mouth daily. Fill after 10/1 30 tablet 2   [START ON 11/07/2021] eszopiclone (LUNESTA) 2 MG TABS tablet Take 1 tablet (2 mg total) by mouth at bedtime as needed for sleep. Take immediately before bedtime 30 tablet 2   Liraglutide -Weight Management (SAXENDA) 18 MG/3ML SOPN Inject 0.6 mg into the skin daily. 3 mL 1   Liraglutide -Weight Management (SAXENDA) 18 MG/3ML SOPN Inject 1.2 mg into the skin daily. 3 mL 0   [START ON 11/20/2021] Liraglutide -Weight Management (SAXENDA) 18 MG/3ML SOPN Inject 1.8 mg into the skin daily. 3 mL 0   ondansetron (ZOFRAN) 4 MG tablet Take 1 tablet (4 mg total) by mouth every 8 (eight) hours as needed. 20 tablet 0   promethazine-dextromethorphan (PROMETHAZINE-DM) 6.25-15 MG/5ML syrup Take 5 mLs by mouth 4 (four) times daily as needed for cough. 118 mL 0   rizatriptan (MAXALT) 10 MG tablet Take 1 tablet (10 mg total) by mouth as needed for migraine. May repeat in 2 hours if needed 10 tablet 0   spironolactone (ALDACTONE) 100 MG tablet Take 1 tablet by mouth once daily 30 tablet 0   topiramate (TOPAMAX) 100 MG tablet Take 1 tablet (100 mg total) by mouth 2 (two) times daily. 60  tablet 3   No current facility-administered medications on file prior to visit.    Allergies  Allergen Reactions   Ketorolac Tromethamine Hives    Lips swelled    Physical exam:  Today's Vitals   10/23/21 0841  BP: 105/70  Pulse: 63  SpO2: 97%  Weight: 252 lb (114.3 kg)  Height: 5\' 4"  (1.626 m)   Body mass index is 43.26 kg/m.   Wt Readings from Last 3 Encounters:  10/23/21 252 lb (114.3 kg)  10/05/21 251 lb 3.2 oz (113.9 kg)  09/21/21 253 lb 1.9 oz (114.8 kg)     Ht Readings from Last 3 Encounters:  10/23/21 5\' 4"  (1.626 m)  10/05/21 5\' 4"  (1.626 m)  09/21/21 5\' 6"  (1.676 m)      General: The patient is awake, alert and appears not in acute distress. The patient is well groomed. Head: Normocephalic, atraumatic.  Neck is supple. Mallampati 2, red, lacquered mucosa ,  neck circumference:14 inches . Nasal airflow congested / today patent.  Retrognathia is  seen.  Dental statusalbiologicl  Cardiovascular:  Regular rate and cardiac rhythm by pulse,  without distended neck veins. Respiratory: Lungs are clear to auscultation.  Skin:  Without evidence of ankle edema, or rash. Trunk: The patient's posture is erect.   Neurologic exam : The patient is awake and alert, oriented to place and time.   Memory subjective described as intact.  Attention span & concentration ability appears normal.  Speech is fluent,  without  dysarthria, dysphonia or aphasia.  Mood and affect are appropriate.   Cranial nerves: no loss of smell or taste reported  Pupils are equal and briskly reactive to light. Funduscopic exam unremarkable. .  Extraocular movements in vertical and horizontal planes were intact and without nystagmus. No Diplopia. Visual fields by finger perimetry are intact. Hearing was intact to soft  voice and finger rubbing.    Facial sensation intact to fine touch.  Facial motor strength is symmetric and tongue and uvula move midline.  Neck ROM : rotation, tilt and flexion  extension were normal for age and shoulder shrug was symmetrical.    Motor exam:  Symmetric bulk, tone and ROM.   Normal tone without cog wheeling, symmetric grip strength .   Sensory:  Fine touch and vibration were  normal.  Proprioception tested in the upper extremities was normal.   Coordination: Rapid alternating movements in the fingers/hands were of normal speed.  The Finger-to-nose maneuver was intact without evidence of ataxia, dysmetria or tremor.   Gait and station: Patient could rise unassisted from a seated position, walked without assistive device.  Stance is of normal width/ base and the patient turned with 3 steps.  Toe and heel walk were deferred.  Deep tendon reflexes: in the  upper and lower extremities are symmetric and intact.  Babinski response was deferred.      After spending a total time of  45  minutes face to face and additional time for physical and neurologic examination, review of laboratory studies,  personal review of imaging studies, reports and results of other testing and review of referral information / records as far as provided in visit, I have established the following assessments:  1)  sleep initiation insomnia is most likely psychological/ related to mental illness. She feels that she may sleep better in the lab than at home and I will order in lab study.  Xanax prn.  2) her morning headaches and her cluster headaches indicate possible hypoxemia, which will be screened for. 3) nocturia twice at night in a 4 hour sleep period is too much. Check for that with CSA/ OSA as well.    My Plan is to proceed with:  1) attended sleep study.  2) chronic insomnia not likely of organic origin 3) excessive daytime fatigue can be related to thyroid cancer and treatment.   I would like to thank Dr Modesta Messing for allowing me to meet with and to take care of this pleasant patient.    I plan to follow up either personally or through our NP within 3-4 month.   CC:  I will share my notes with Dr Modesta Messing.  Electronically signed by: Larey Seat, MD 10/23/2021 9:18 AM  [facility] Board certified by The [employer] of Sleep Medicine and Diplomate of the [employer] of Sleep Medicine. Board certified In Neurology through the [place], Fellow of the [employer] of Neurology. Medical Director of [facility].

## 2021-10-23 NOTE — Patient Instructions (Signed)

## 2021-10-23 NOTE — Addendum Note (Signed)
Addended by: Larey Seat on: 10/23/2021 09:52 AM   Modules accepted: Orders

## 2021-10-24 ENCOUNTER — Encounter: Payer: Self-pay | Admitting: Psychiatry

## 2021-10-24 ENCOUNTER — Telehealth: Payer: Self-pay

## 2021-10-24 ENCOUNTER — Other Ambulatory Visit: Payer: Self-pay

## 2021-10-24 ENCOUNTER — Telehealth (INDEPENDENT_AMBULATORY_CARE_PROVIDER_SITE_OTHER): Payer: 59 | Admitting: Psychiatry

## 2021-10-24 DIAGNOSIS — G4700 Insomnia, unspecified: Secondary | ICD-10-CM | POA: Diagnosis not present

## 2021-10-24 DIAGNOSIS — F419 Anxiety disorder, unspecified: Secondary | ICD-10-CM

## 2021-10-24 DIAGNOSIS — F331 Major depressive disorder, recurrent, moderate: Secondary | ICD-10-CM

## 2021-10-24 NOTE — Telephone Encounter (Signed)
Patient did not answer the phone. Voice message was full. Contacted the front desk to have an appointment to discuss this (details in the note)

## 2021-10-24 NOTE — Patient Instructions (Signed)
Continue lexapro 20 mg daily Increase rexulti 1 mg daily Continue Lunesta 2 mg at night as needed for sleep Continue Xanax 0.25 mg in AM  Next appointment: 1/9 at 3 PM

## 2021-10-24 NOTE — Telephone Encounter (Signed)
pt called states she needs to speak with [name].  that she about to lose her job and that she not getting her short term disability because of what her notes states. she states that it is stuff in there about she goes to church 3 times a week and that she states it is other stuff in her notes that is completely not true and she needs to speak with [name] today.

## 2021-10-24 NOTE — Progress Notes (Signed)
Virtual Visit via Video Note  I connected with Olivia Woodward on 10/24/21 at  2:30 PM EST by a video enabled telemedicine application and verified that I am speaking with the correct person using two identifiers.  Location: Patient: home Provider: office Persons participated in the visit- patient, provider    I discussed the limitations of evaluation and management by telemedicine and the availability of in person appointments. The patient expressed understanding and agreed to proceed.   I discussed the assessment and treatment plan with the patient. The patient was provided an opportunity to ask questions and all were answered. The patient agreed with the plan and demonstrated an understanding of the instructions.   The patient was advised to call back or seek an in-person evaluation if the symptoms worsen or if the condition fails to improve as anticipated.  I provided 20 minutes of non-face-to-face time during this encounter.   Norman Clay, MD    [facility] MD/PA/NP OP Progress Note  10/24/2021 4:47 PM Olivia Woodward  MRN:  [id number]  Chief Complaint:  Chief Complaint   Follow-up; Depression    HPI:  This is a follow-up appointment for depression and insomnia.  She states that she received a letter that short-term disability was denied.  She questions that it says in the note in the each visit that she goes to church 3 times a week, does not correct.  This clinician informed her that there was a time she was going outside more in October.  Although this clinician thought she went to church 3 times a day, she states that she went there only once.  This clinician explained that this may be related to her difficulty in concentration at the time of the visit rather than miscommunication.  This clinician also clarified that this clinician has not written on each visit that she goes to church regularly.  She also reports frustration that her speech is documented as clear, coherent with fair  eye contact/fairly groomed.  This clinician explained to her that these documents are observed during the exam.  She states that she cannot fake.  However, she knows something is wrong with her, and she is very worried, talking about an episode of confusion she had when she went to [place].  Although she loves her job, she does not think she can return to work as she is not at correct state of mind.  She is also concerned that she may lose her job.  Her father, who is in AT is paying bills.  She has insomnia.  She feels depressed.  She has fair appetite.  She has significant difficulty in concentration.  She feels fatigue.  She denies SI as she has kids.  She feels anxious and has panic attacks.  While reviewing her medication, she has not uptitrated rexulti as discussed at the previous visit.  She agrees to do so this time.    Serial seven-  "93 (answered this after pausing for a while)," "84.. 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 (69 year old daughter, and 56 yo son, going to college in 2022) Education: graduated from [employer], attended [employer]   Visit Diagnosis:    ICD-10-CM   1. Moderate episode of recurrent major depressive disorder (HCC)  F33.1  2. Anxiety  F41.9     3. Insomnia, unspecified type  G47.00       Past Psychiatric History: Please see initial evaluation for full details. I have reviewed the history. No updates at this time.     Past Medical History:  Past Medical History:  Diagnosis Date   Anemia    Anxiety    Phreesia 03/18/2021   Depression    Phreesia 03/18/2021   Generalized headaches    Hypertension     Past Surgical History:  Procedure Laterality Date   ABDOMINAL HYSTERECTOMY      fibroids, partial   BREAST REDUCTION SURGERY  2005   COLONOSCOPY WITH PROPOFOL N/A 05/10/2019   Procedure: COLONOSCOPY WITH PROPOFOL;  Surgeon: Daneil Dolin, MD;  [facility];  Service: Endoscopy;  Laterality: N/A;  2:45pm   GASTRIC BYPASS  2007   POLYPECTOMY  05/10/2019   Procedure: POLYPECTOMY;  Surgeon: Daneil Dolin, MD;  [facility]: [facility];  Service: Endoscopy;;  colon    Family Psychiatric History: Please see initial evaluation for full details. I have reviewed the history. No updates at this time.     Family History:  Family History  Problem Relation Age of Onset   Hypertension Mother    Diabetes Mother    Drug abuse Mother    Hypertension Father    Hypertension Sister    Hypertension Sister    Colon cancer Neg Hx     Social History:  Social History   Socioeconomic History   Marital status: Single    Spouse name: Not on file   Number of children: 2   Years of education: 12   Highest education level: Not on file  Occupational History   Not on file  Tobacco Use   Smoking status: Never   Smokeless tobacco: Never  Vaping Use   Vaping Use: Never used  Substance and Sexual Activity   Alcohol use: No   Drug use: No   Sexual activity: Yes    Birth control/protection: Condom  Other Topics Concern   Not on file  Social History Narrative   Right handed   Caffeine use: tea/soda twice weekly   Social Determinants of Health   Financial Resource Strain: Not on file  Food Insecurity: Not on file  Transportation Needs: Not on file  Physical Activity: Not on file  Stress: Not on file  Social Connections: Not on file    Allergies:  Allergies  Allergen Reactions   Ketorolac Tromethamine Hives    Lips swelled    Metabolic Disorder Labs: Lab Results  Component Value Date   HGBA1C 5.4 03/22/2021   No results found for: PROLACTIN Lab Results  Component Value Date   CHOL 164 03/22/2021   TRIG 73 03/22/2021   HDL 69 03/22/2021   CHOLHDL  2.4 03/22/2021   VLDL 10 01/02/2017   LDLCALC 81 03/22/2021   LDLCALC 124 (H) 09/06/2020   Lab Results  Component Value Date   TSH 2.542 10/04/2021   TSH 0.820 08/02/2021    Therapeutic Level Labs: No results found for: LITHIUM No results found for: VALPROATE No components found for:  CBMZ  Current Medications: Current Outpatient Medications  Medication Sig Dispense Refill   AIMOVIG 140 MG/ML SOAJ SMARTSIG:140 Milligram(s) SUB-Q Once a Month     albuterol (VENTOLIN HFA) 108 (90 Base) MCG/ACT inhaler Inhale 2 puffs into the lungs every 6 (six) hours as needed for wheezing or shortness of breath. 18 g 0  ALPRAZolam (XANAX) 0.25 MG tablet Take 1 tablet (0.25 mg total) by mouth 2 (two) times daily as needed for anxiety. 60 tablet 1   amLODipine (NORVASC) 10 MG tablet Take 1 tablet (10 mg total) by mouth daily. 90 tablet 3   [START ON 10/31/2021] brexpiprazole (REXULTI) 1 MG TABS tablet Take 1 tablet (1 mg total) by mouth daily. 30 tablet 0   Cyanocobalamin (B-12 PO) Take 2,500 mcg by mouth daily.     ergocalciferol (VITAMIN D2) 1.25 MG (50000 UT) capsule Take 1 capsule (50,000 Units total) by mouth once a week. One capsule once weekly 12 capsule 2   escitalopram (LEXAPRO) 20 MG tablet Take 1 tablet (20 mg total) by mouth daily. Fill after 10/1 30 tablet 2   [START ON 11/07/2021] eszopiclone (LUNESTA) 2 MG TABS tablet Take 1 tablet (2 mg total) by mouth at bedtime as needed for sleep. Take immediately before bedtime 30 tablet 2   Liraglutide -Weight Management (SAXENDA) 18 MG/3ML SOPN Inject 0.6 mg into the skin daily. 3 mL 1   Liraglutide -Weight Management (SAXENDA) 18 MG/3ML SOPN Inject 1.2 mg into the skin daily. 3 mL 0   [START ON 11/20/2021] Liraglutide -Weight Management (SAXENDA) 18 MG/3ML SOPN Inject 1.8 mg into the skin daily. 3 mL 0   ondansetron (ZOFRAN) 4 MG tablet Take 1 tablet (4 mg total) by mouth every 8 (eight) hours as needed. 20 tablet 0   promethazine-dextromethorphan  (PROMETHAZINE-DM) 6.25-15 MG/5ML syrup Take 5 mLs by mouth 4 (four) times daily as needed for cough. 118 mL 0   rizatriptan (MAXALT) 10 MG tablet Take 1 tablet (10 mg total) by mouth as needed for migraine. May repeat in 2 hours if needed 10 tablet 0   spironolactone (ALDACTONE) 100 MG tablet Take 1 tablet by mouth once daily 30 tablet 0   topiramate (TOPAMAX) 100 MG tablet Take 1 tablet (100 mg total) by mouth 2 (two) times daily. 60 tablet 3   No current facility-administered medications for this visit.     Musculoskeletal: Strength & Muscle Tone:  N/A Gait & Station:  N/A Patient leans: N/A  Psychiatric Specialty Exam: Review of Systems  Psychiatric/Behavioral:  Positive for confusion, decreased concentration, dysphoric mood and sleep disturbance. Negative for agitation, behavioral problems, hallucinations, self-injury and suicidal ideas. The patient is nervous/anxious. The patient is not hyperactive.   All other systems reviewed and are negative.  There were no vitals taken for this visit.There is no height or weight on file to calculate BMI.  General Appearance: Fairly Groomed  Eye Contact:  Good  Speech:  Clear and Coherent  Volume:  Normal  Mood:  Anxious and Depressed  Affect:  Appropriate, Congruent, Tearful, and labile  Thought Process:  Coherent  Orientation:  Full (Time, Place, and Person) except date, day of the week  Thought Content: Logical   Suicidal Thoughts:  No  Homicidal Thoughts:  No  Memory:  Immediate;   Poor  Judgement:  Good  Insight:  Shallow  Psychomotor Activity:  Decreased  Concentration:  Concentration: Poor and Attention Span: Poor  Recall:  Poor  Fund of Knowledge: Good  Language: Good  Akathisia:  No  Handed:  Right  AIMS (if indicated): not done  Assets:  Physical Health  ADL's:  Intact  Cognition: Impaired,  Mild  Sleep:  Poor   Screenings: GAD-7    Flowsheet Row Office Visit from 08/16/2021 in [facility] Phone  Follow Up from 06/26/2021 in [place]  [facility] Video Visit from 06/12/2021 in [facility] Video Visit from 05/04/2021 in [facility] Office Visit from 03/17/2019 in [facility]  Total GAD-7 Score 13 14 13 12 5       PHQ2-9    [place] Office Visit from 09/21/2021 in [facility] Office Visit from 09/13/2021 in [facility] Office Visit from 09/07/2021 in [facility] Office Visit from 08/29/2021 in [place] Visit from 08/16/2021 in [facility]  PHQ-2 Total Score 2 5 0 6 6  PHQ-9 Total Score -- 18 0 19 21      Flowsheet Row Video Visit from 08/31/2021 in [facility] Video Visit from 07/27/2021 in [place] Office Visit from 06/19/2021 in [place] Error: Q3, 4, or 5 should not be populated when Q2 is No Error: Question 6 not populated Error: Q3, 4, or 5 should not be populated when Q2 is No        Assessment and Plan:  Olivia Woodward is a 55 y.o. year old female with a history of depression, iron deficiency anemia,  multinodular goiter, subclinical hyperthyroidism, vitamin D deficiency, hypertension, s/p gastric bypass surgery in 2017, who presents for follow up appointment for below.   1. Moderate episode of recurrent major depressive disorder ([place]) 2. Anxiety She continues to demonstrate significant impairment in attention, concentration as evidenced by the test documented as above, and she experiences depressive symptoms and anxiety since the last visit.  Recent psychosocial stressors includes being denied of short-term disability, conflict with her children, and the recent loss of her aunt.  Although it was discussed to uptitrate rexulti at the last visit, she stayed on the same dose.  She was advised again to uptitrate the dose to optimize treatment for depression and  anxiety.  Discussed potential metabolic side effect and EPS.  Will continue Lexapro to target depression and anxiety.  Will continue Xanax as needed for anxiety.   3. Insomnia, unspecified type Poor.  She has an upcoming appointment for sleep evaluation.  Will continue current dose of Lunesta to target insomnia.    This clinician has discussed the side effect associated with medication prescribed during this encounter. Please refer to notes in the previous encounters for more details.      I would support that she would remain out from work with expected return date on March 7th.  She has depressive symptoms with significant impairment in cognition/memory and fatigue, which interferes with her ability to work at her capacity.    Plan Continue lexapro 20 mg daily Increase rexulti 1 mg daily Continue Lunesta 2 mg at night as needed for sleep Continue Xanax 0.25 mg in AM (used to be on 1 mg at night ) Next appointment: 1/9 at 3 PM for 30 mins, in person   The patient demonstrates the following risk factors for suicide: Chronic risk factors for suicide include: psychiatric disorder of depression. Acute risk factors for suicide include: loss (financial, interpersonal, professional). Protective factors for this patient include: responsibility to others (children, family), coping skills, hope for the future and religious beliefs against suicide. Considering these factors, the overall suicide risk at this point appears to be low. Patient is appropriate for outpatient follow up.  Norman Clay, MD 10/24/2021, 4:47 PM

## 2021-10-25 ENCOUNTER — Telehealth: Payer: Self-pay

## 2021-10-25 NOTE — Telephone Encounter (Signed)
error 

## 2021-11-01 ENCOUNTER — Other Ambulatory Visit: Payer: Self-pay

## 2021-11-01 ENCOUNTER — Ambulatory Visit (INDEPENDENT_AMBULATORY_CARE_PROVIDER_SITE_OTHER): Payer: 59 | Admitting: Licensed Clinical Social Worker

## 2021-11-01 DIAGNOSIS — F322 Major depressive disorder, single episode, severe without psychotic features: Secondary | ICD-10-CM

## 2021-11-05 ENCOUNTER — Other Ambulatory Visit: Payer: Self-pay | Admitting: Family Medicine

## 2021-11-07 ENCOUNTER — Other Ambulatory Visit: Payer: Self-pay

## 2021-11-07 ENCOUNTER — Ambulatory Visit (INDEPENDENT_AMBULATORY_CARE_PROVIDER_SITE_OTHER): Payer: 59 | Admitting: Licensed Clinical Social Worker

## 2021-11-07 DIAGNOSIS — F322 Major depressive disorder, single episode, severe without psychotic features: Secondary | ICD-10-CM

## 2021-11-13 ENCOUNTER — Other Ambulatory Visit: Payer: Self-pay | Admitting: Family Medicine

## 2021-11-20 ENCOUNTER — Telehealth: Payer: Self-pay

## 2021-11-20 ENCOUNTER — Telehealth: Payer: Self-pay | Admitting: Psychiatry

## 2021-11-20 NOTE — Telephone Encounter (Signed)
Called the patient due to her request. Left voice message to contact the office. Please ask her if she has any specifics to be written in the form.  We can also discuss this at her next visit so that it will be filled appropriately.

## 2021-11-20 NOTE — Telephone Encounter (Signed)
pt states she needs you to call her .  she got more forms to fill out but she states that she needs to speak with you about this form.  she states that it needs documentation that you are doing testing on her memory and what kind of treatment she having

## 2021-11-20 NOTE — Telephone Encounter (Signed)
I do not think we have received a form yet. Please advise her to have this form send to [place] so that we can discuss. We can discuss this at her next visit as long as we receive that form.

## 2021-11-21 ENCOUNTER — Ambulatory Visit (INDEPENDENT_AMBULATORY_CARE_PROVIDER_SITE_OTHER): Payer: 59 | Admitting: Licensed Clinical Social Worker

## 2021-11-21 ENCOUNTER — Other Ambulatory Visit: Payer: Self-pay

## 2021-11-21 DIAGNOSIS — F322 Major depressive disorder, single episode, severe without psychotic features: Secondary | ICD-10-CM

## 2021-11-21 NOTE — Progress Notes (Signed)
Primary Care Physician:  Fayrene Helper, MD Primary Gastroenterologist:  Dr. Gala Romney  Chief Complaint  Patient presents with   Constipation    Black stools, straining with BM very bad. Tried dulcolax but not working. Very nauseated    HPI:   Olivia Woodward is a 56 y.o. female presenting today with a history of IDA now resolved, adenomas with next colonoscopy due in 2025, gastric bypass in 2007, returning now with constipation and concern for black stools.   About a week ago started having left-sided pain (left upper and lower). Black stool for about a week. Feels constipated. Hasn't had a complete BM in about a week. Small balls coming out. History of constipation. Took a dose of Linzess to help have tiny BMs that was leftover from samples. Has felt miserable. Never has regular BMs. No iron. Feels nauseated. Has nausea medication. No dysphagia. No postprandial abdominal pain. Poor appetite this week. Feels full. No fever. +flatus. No vomiting.   Feels like there is a ball in LUQ at times. Chronic. Sharp pain almost like a contraction.   Past Medical History:  Diagnosis Date   Anemia    Anxiety    Phreesia 03/18/2021   Depression    Phreesia 03/18/2021   Generalized headaches    Hypertension     Past Surgical History:  Procedure Laterality Date   ABDOMINAL HYSTERECTOMY     fibroids, partial   BREAST REDUCTION SURGERY  2005   COLONOSCOPY WITH PROPOFOL N/A 05/10/2019   Two 5 to 6 mm polyps in the sigmoid colon and in the ascending colon, removed with a   GASTRIC BYPASS  2007   POLYPECTOMY  05/10/2019   Procedure: POLYPECTOMY;  Surgeon: Daneil Dolin, MD;  [facility];  Service: Endoscopy;;  colon    Current Outpatient Medications  Medication Sig Dispense Refill   AIMOVIG 140 MG/ML SOAJ SMARTSIG:140 Milligram(s) SUB-Q Once a Month     albuterol (VENTOLIN HFA) 108 (90 Base) MCG/ACT inhaler Inhale 2 puffs into the lungs every 6 (six) hours as needed  for wheezing or shortness of breath. 18 g 0   ALPRAZolam (XANAX) 0.25 MG tablet Take 1 tablet (0.25 mg total) by mouth 2 (two) times daily as needed for anxiety. 60 tablet 1   amLODipine (NORVASC) 10 MG tablet TAKE 1 TABLET BY MOUTH ONCE DAILY DOSE  INCREASE  EFFECTIVE  09/11/2020 90 tablet 0   brexpiprazole (REXULTI) 1 MG TABS tablet Take 1 tablet (1 mg total) by mouth daily. 30 tablet 0   Cyanocobalamin (B-12 PO) Take 2,500 mcg by mouth daily.     ergocalciferol (VITAMIN D2) 1.25 MG (50000 UT) capsule Take 1 capsule (50,000 Units total) by mouth once a week. One capsule once weekly 12 capsule 2   escitalopram (LEXAPRO) 20 MG tablet Take 1 tablet (20 mg total) by mouth daily. Fill after 10/1 30 tablet 2   eszopiclone (LUNESTA) 2 MG TABS tablet Take 1 tablet (2 mg total) by mouth at bedtime as needed for sleep. Take immediately before bedtime 30 tablet 2   Liraglutide -Weight Management (SAXENDA) 18 MG/3ML SOPN Inject 1.8 mg into the skin daily. 3 mL 0   ondansetron (ZOFRAN) 4 MG tablet TAKE 1 TABLET BY MOUTH EVERY 8 HOURS AS NEEDED 20 tablet 0   promethazine-dextromethorphan (PROMETHAZINE-DM) 6.25-15 MG/5ML syrup Take 5 mLs by mouth 4 (four) times daily as needed for cough. 118 mL 0   rizatriptan (MAXALT) 10 MG  tablet Take 1 tablet (10 mg total) by mouth as needed for migraine. May repeat in 2 hours if needed 10 tablet 0   spironolactone (ALDACTONE) 100 MG tablet Take 1 tablet by mouth once daily 30 tablet 0   topiramate (TOPAMAX) 100 MG tablet Take 1 tablet (100 mg total) by mouth 2 (two) times daily. 60 tablet 3   No current facility-administered medications for this visit.    Allergies as of 11/22/2021 - Review Complete 11/22/2021  Allergen Reaction Noted   Ketorolac tromethamine Hives 03/16/2009    Family History  Problem Relation Age of Onset   Hypertension Mother    Diabetes Mother    Drug abuse Mother    Hypertension Father    Hypertension Sister    Hypertension Sister    Colon  cancer Neg Hx     Social History   Socioeconomic History   Marital status: Single    Spouse name: Not on file   Number of children: 2   Years of education: 12   Highest education level: Not on file  Occupational History   Not on file  Tobacco Use   Smoking status: Never   Smokeless tobacco: Never  Vaping Use   Vaping Use: Never used  Substance and Sexual Activity   Alcohol use: No   Drug use: No   Sexual activity: Yes    Birth control/protection: Condom  Other Topics Concern   Not on file  Social History Narrative   Right handed   Caffeine use: tea/soda twice weekly   Social Determinants of Health   Financial Resource Strain: Not on file  Food Insecurity: Not on file  Transportation Needs: Not on file  Physical Activity: Not on file  Stress: Not on file  Social Connections: Not on file  Intimate Partner Violence: Not on file    Review of Systems: Gen: Denies any fever, chills, fatigue, weight loss, lack of appetite.  CV: Denies chest pain, heart palpitations, peripheral edema, syncope.  Resp: Denies shortness of breath at rest or with exertion. Denies wheezing or cough.  GI: Denies dysphagia or odynophagia. Denies jaundice, hematemesis, fecal incontinence. GU : Denies urinary burning, urinary frequency, urinary hesitancy MS: Denies joint pain, muscle weakness, cramps, or limitation of movement.  Derm: Denies rash, itching, dry skin Psych: Denies depression, anxiety, memory loss, and confusion Heme: Denies bruising, bleeding, and enlarged lymph nodes.  Physical Exam: BP (!) 137/93    Pulse 83    Temp (!) 97.1 F (36.2 C)    Ht 5\' 6"  (1.676 m)    Wt 251 lb 3.2 oz (113.9 kg)    BMI 40.54 kg/m  General:   Alert and oriented. Pleasant and cooperative. Well-nourished and well-developed.  Head:  Normocephalic and atraumatic. Eyes:  Without icterus, sclera clear and conjunctiva pink.  Ears:  Normal auditory acuity. Mouth:  No deformity or lesions, oral mucosa pink.   Lungs:  Clear to auscultation bilaterally. No wheezes, rales, or rhonchi. No distress.  Heart:  S1, S2 present without murmurs appreciated.  Abdomen:  +BS, soft, non-tender and non-distended. No HSM noted. No guarding or rebound. No masses appreciated.  Rectal: patient declined.   Msk:  Symmetrical without gross deformities. Normal posture. Extremities:  Without edema. Neurologic:  Alert and  oriented x4;  grossly normal neurologically. Skin:  Intact without significant lesions or rashes. Psych:  Alert and cooperative. Normal mood and affect.  ASSESSMENT: Olivia Woodward is a 56 y.o. female presenting today with a history  of IDA now resolved, adenomas with next colonoscopy due in 2025, gastric bypass in 2007, now with constipation and concern for black stools.   Constipation: no concerning physical signs on exam. Abdominal xray completed after visit without obstruction and noted moderate stool burden. Recommended Miralax purge today, then starting Linzess 290 mcg daily thereafter.   Black stool: per patient. She is declining rectal exam. Also notes a "ball" in LUQ at times. Unclear if stool is simply dark from obstipation or true melena. I am checking a CBC and requesting ifbot. We may need to pursue EGD in near future. She is to call if any further black stool.    PLAN:  Abdominal xray completed  Miralax purge, followed by starting Linzess 290 mcg daily  Check ifobt  Check CBC   May need to pursue EGD: await CBC and ifobt. Patient to call if any further black stool.   Annitta Needs, PhD, ANP-BC [facility]

## 2021-11-22 ENCOUNTER — Other Ambulatory Visit: Payer: Self-pay

## 2021-11-22 ENCOUNTER — Ambulatory Visit (INDEPENDENT_AMBULATORY_CARE_PROVIDER_SITE_OTHER): Payer: 59 | Admitting: Gastroenterology

## 2021-11-22 ENCOUNTER — Encounter: Payer: Self-pay | Admitting: Gastroenterology

## 2021-11-22 ENCOUNTER — Ambulatory Visit (HOSPITAL_COMMUNITY)
Admission: RE | Admit: 2021-11-22 | Discharge: 2021-11-22 | Disposition: A | Discharge status: Home / Self Care | Payer: 59 | Source: Ambulatory Visit | Attending: Gastroenterology | Admitting: Gastroenterology

## 2021-11-22 ENCOUNTER — Other Ambulatory Visit (HOSPITAL_COMMUNITY)
Admission: RE | Admit: 2021-11-22 | Discharge: 2021-11-22 | Disposition: A | Discharge status: Home / Self Care | Payer: 59 | Source: Ambulatory Visit | Attending: Gastroenterology | Admitting: Gastroenterology

## 2021-11-22 ENCOUNTER — Telehealth: Payer: Self-pay

## 2021-11-22 VITALS — BP 137/93 | HR 83 | Temp 97.1°F | Ht 66.0 in | Wt 251.2 lb

## 2021-11-22 DIAGNOSIS — D508 Other iron deficiency anemias: Secondary | ICD-10-CM | POA: Insufficient documentation

## 2021-11-22 DIAGNOSIS — R1084 Generalized abdominal pain: Secondary | ICD-10-CM

## 2021-11-22 LAB — CBC WITH DIFFERENTIAL/PLATELET
Abs Immature Granulocytes: 0.02 10*3/uL (ref 0.00–0.07)
Basophils Absolute: 0 10*3/uL (ref 0.0–0.1)
Basophils Relative: 1 %
Eosinophils Absolute: 0.1 10*3/uL (ref 0.0–0.5)
Eosinophils Relative: 2 %
HCT: 45 % (ref 36.0–46.0)
Hemoglobin: 13.8 g/dL (ref 12.0–15.0)
Immature Granulocytes: 0 %
Lymphocytes Relative: 26 %
Lymphs Abs: 1.7 10*3/uL (ref 0.7–4.0)
MCH: 32.6 pg (ref 26.0–34.0)
MCHC: 30.7 g/dL (ref 30.0–36.0)
MCV: 106.4 fL — ABNORMAL HIGH (ref 80.0–100.0)
Monocytes Absolute: 0.5 10*3/uL (ref 0.1–1.0)
Monocytes Relative: 8 %
Neutro Abs: 4.2 10*3/uL (ref 1.7–7.7)
Neutrophils Relative %: 63 %
Platelets: 298 10*3/uL (ref 150–400)
RBC: 4.23 MIL/uL (ref 3.87–5.11)
RDW: 12.2 % (ref 11.5–15.5)
WBC: 6.5 10*3/uL (ref 4.0–10.5)
nRBC: 0 % (ref 0.0–0.2)

## 2021-11-22 NOTE — Patient Instructions (Addendum)
Please have blood work and xray today. Once we get the results of the xray, we will call you.  Once we see the xray is ok, I want you to take 1 capful of Miralax in 8 ounces of water every hour up to 6 doses.  The day after you do this, we need to get you started on a medication for constipation that you take regularly. I have given samples of the highest dosage of Linzess to take once each morning, 30 minutes before breakfast. Start this tomorrow morning. You may have looser stool at first, but this is normal. Continue taking it unless diarrhea continues for more than a week.  I also am requesting that we check your stool for blood. You may need an endoscopy. Let me know if you have persistent black stool  Further recommendations after xray today!  It was a pleasure to see you today. I want to create trusting relationships with patients to provide genuine, compassionate, and quality care. I value your feedback. If you receive a survey regarding your visit,  I greatly appreciate you taking time to fill this out.   Annitta Needs, PhD, ANP-BC [facility]

## 2021-11-22 NOTE — Progress Notes (Addendum)
Virtual Visit via Video Note  I connected with Olivia Woodward on 11/26/21 at  3:00 PM EST by a video enabled telemedicine application and verified that I am speaking with the correct person using two identifiers.  Location: Patient: home Provider: office Persons participated in the visit- patient, provider    I discussed the limitations of evaluation and management by telemedicine and the availability of in person appointments. The patient expressed understanding and agreed to proceed.     I discussed the assessment and treatment plan with the patient. The patient was provided an opportunity to ask questions and all were answered. The patient agreed with the plan and demonstrated an understanding of the instructions.   The patient was advised to call back or seek an in-person evaluation if the symptoms worsen or if the condition fails to improve as anticipated.  I provided 21 minutes of non-face-to-face time during this encounter.   Norman Clay, MD    [facility] MD/PA/NP OP Progress Note  11/26/2021 3:33 PM Olivia Woodward  MRN:  [id number]  Chief Complaint:  Chief Complaint   Follow-up; Depression    HPI:  This is a follow-up appointment for depression and insomnia.  She states that she could not come for in person visit as she is scared of driving by herself as she tends to get confused, and there was nobody to bring her. She lies down while doing the interview, stating that she is not feeling well. When she was asked about her contacting the office about the form the other day, she states that she was told that what this writer is stating in the note and what is written in the form was different.  She was advised by them that this writer needs to write a letter.  However, she is unable to elaborate this.  She talks about the frustration that it says in the note that she has good eye contact, and is able to do things she was instructed to do.  She was informed that this was how she was  observed during the previous encounters.  She reports frustration that this Probation officer is not listening to her or write things differently. She was perseverative on this. She also states that this writer is refusing to help her to see what is going on in her mind while she struggles with memory loss. She is unable to remember what she was going to do when she went to the kitchen. She also does not recall when this writer advised her to discuss this when she sees a neurologist for sleep evaluation. Although it was explained at length that her memory loss is likely secondary to worsening in depression, she states that she does not think it is coming from depression.  She agrees for neurology referral given she is very concerned about her memory loss, and does not think it is due to depression.  She has insomnia.  She feels fatigued.  She has decrease in appetite.  Although she wishes to disappear as nobody is understanding her, she adamantly denies any plan or intent as she has children.  Of note, she is unable to tell which medication she is taking as her daughter takes care of them. She states that she has been taking higher dose of rexulti, and has not noticed any change.  She agrees to try further up titration at this time.   In Dec 2023:  Serial seven-  "18 (answered this after pausing for a while)," "84.. no 93"  Orientation- oriented to year, month only Delayed recall- 2/3  240 lbs Wt Readings from Last 3 Encounters:  11/22/21 251 lb 3.2 oz (113.9 kg)  10/23/21 252 lb (114.3 kg)  10/05/21 251 lb 3.2 oz (113.9 kg)     Visit Diagnosis:    ICD-10-CM   1. Moderate episode of recurrent major depressive disorder (HCC)  F33.1     2. Anxiety  F41.9 ALPRAZolam (XANAX) 0.25 MG tablet    3. Insomnia, unspecified type  G47.00       Past Psychiatric History: Please see initial evaluation for full details. I have reviewed the history. No updates at this time.     Past Medical History:  Past Medical  History:  Diagnosis Date   Anemia    Anxiety    Phreesia 03/18/2021   Depression    Phreesia 03/18/2021   Generalized headaches    Hypertension     Past Surgical History:  Procedure Laterality Date   ABDOMINAL HYSTERECTOMY     fibroids, partial   BREAST REDUCTION SURGERY  2005   COLONOSCOPY WITH PROPOFOL N/A 05/10/2019   Two 5 to 6 mm polyps in the sigmoid colon and in the ascending colon, removed with a   GASTRIC BYPASS  2007   POLYPECTOMY  05/10/2019   Procedure: POLYPECTOMY;  Surgeon: Daneil Dolin, MD;  Location: [facility];  Service: Endoscopy;;  colon    Family Psychiatric History: Please see initial evaluation for full details. I have reviewed the history. No updates at this time.     Family History:  Family History  Problem Relation Age of Onset   Hypertension Mother    Diabetes Mother    Drug abuse Mother    Hypertension Father    Hypertension Sister    Hypertension Sister    Colon cancer Neg Hx     Social History:  Social History   Socioeconomic History   Marital status: Single    Spouse name: Not on file   Number of children: 2   Years of education: 12   Highest education level: Not on file  Occupational History   Not on file  Tobacco Use   Smoking status: Never   Smokeless tobacco: Never  Vaping Use   Vaping Use: Never used  Substance and Sexual Activity   Alcohol use: No   Drug use: No   Sexual activity: Yes    Birth control/protection: Condom  Other Topics Concern   Not on file  Social History Narrative   Right handed   Caffeine use: tea/soda twice weekly   Social Determinants of Health   Financial Resource Strain: Not on file  Food Insecurity: Not on file  Transportation Needs: Not on file  Physical Activity: Not on file  Stress: Not on file  Social Connections: Not on file    Allergies:  Allergies  Allergen Reactions   Ketorolac Tromethamine Hives    Lips swelled    Metabolic Disorder Labs: Lab Results  Component  Value Date   HGBA1C 5.4 03/22/2021   No results found for: PROLACTIN Lab Results  Component Value Date   CHOL 164 03/22/2021   TRIG 73 03/22/2021   HDL 69 03/22/2021   CHOLHDL 2.4 03/22/2021   VLDL 10 01/02/2017   LDLCALC 81 03/22/2021   LDLCALC 124 (H) 09/06/2020   Lab Results  Component Value Date   TSH 2.542 10/04/2021   TSH 0.820 08/02/2021    Therapeutic Level Labs: No results found for: LITHIUM No  results found for: VALPROATE No components found for:  CBMZ  Current Medications: Current Outpatient Medications  Medication Sig Dispense Refill   AIMOVIG 140 MG/ML SOAJ SMARTSIG:140 Milligram(s) SUB-Q Once a Month     albuterol (VENTOLIN HFA) 108 (90 Base) MCG/ACT inhaler Inhale 2 puffs into the lungs every 6 (six) hours as needed for wheezing or shortness of breath. 18 g 0   [START ON 12/14/2021] ALPRAZolam (XANAX) 0.25 MG tablet Take 1 tablet (0.25 mg total) by mouth 2 (two) times daily as needed for anxiety. 60 tablet 0   amLODipine (NORVASC) 10 MG tablet TAKE 1 TABLET BY MOUTH ONCE DAILY DOSE  INCREASE  EFFECTIVE  09/11/2020 90 tablet 0   brexpiprazole (REXULTI) 2 MG TABS tablet Take 1 tablet (2 mg total) by mouth daily. 30 tablet 0   Cyanocobalamin (B-12 PO) Take 2,500 mcg by mouth daily.     ergocalciferol (VITAMIN D2) 1.25 MG (50000 UT) capsule Take 1 capsule (50,000 Units total) by mouth once a week. One capsule once weekly 12 capsule 2   escitalopram (LEXAPRO) 20 MG tablet Take 1 tablet (20 mg total) by mouth daily. 30 tablet 2   eszopiclone (LUNESTA) 2 MG TABS tablet Take 1 tablet (2 mg total) by mouth at bedtime as needed for sleep. Take immediately before bedtime 30 tablet 2   Liraglutide -Weight Management (SAXENDA) 18 MG/3ML SOPN Inject 1.8 mg into the skin daily. 3 mL 0   ondansetron (ZOFRAN) 4 MG tablet TAKE 1 TABLET BY MOUTH EVERY 8 HOURS AS NEEDED 20 tablet 0   promethazine-dextromethorphan (PROMETHAZINE-DM) 6.25-15 MG/5ML syrup Take 5 mLs by mouth 4 (four)  times daily as needed for cough. 118 mL 0   rizatriptan (MAXALT) 10 MG tablet Take 1 tablet (10 mg total) by mouth as needed for migraine. May repeat in 2 hours if needed 10 tablet 0   spironolactone (ALDACTONE) 100 MG tablet Take 1 tablet by mouth once daily 30 tablet 0   topiramate (TOPAMAX) 100 MG tablet Take 1 tablet (100 mg total) by mouth 2 (two) times daily. 60 tablet 3   No current facility-administered medications for this visit.     Musculoskeletal: Strength & Muscle Tone:  N/A Gait & Station:  N/A Patient leans: N/A  Psychiatric Specialty Exam: Review of Systems  Psychiatric/Behavioral:  Positive for decreased concentration, dysphoric mood and sleep disturbance. Negative for agitation, behavioral problems, confusion, hallucinations, self-injury and suicidal ideas. The patient is nervous/anxious. The patient is not hyperactive.   All other systems reviewed and are negative.  There were no vitals taken for this visit.There is no height or weight on file to calculate BMI.  General Appearance: Fairly Groomed  Eye Contact:  Minimal  Speech:   low volume, normal rate  Volume:  Normal  Mood:  Depressed  Affect:  Appropriate, Congruent, Restricted, and fatigue  Thought Process:  Coherent  Orientation:  Other:  oriented to self, situation  Thought Content: Logical   Suicidal Thoughts:  No  Homicidal Thoughts:  No  Memory:  Immediate;   Good  Judgement:  Fair  Insight:  Present  Psychomotor Activity:  Decreased  Concentration:  Concentration: Poor and Attention Span: Poor  Recall:  Poor  Fund of Knowledge: Good  Language: Good  Akathisia:  No  Handed:  Right  AIMS (if indicated): not done  Assets:  Communication Skills Desire for Improvement  ADL's:  Intact  Cognition: WNL  Sleep:  Poor   Screenings: GAD-7    Flowsheet Row  Office Visit from 08/16/2021 in [facility] Phone Follow Up from 06/26/2021 in [facility] Video Visit from  06/12/2021 in [facility] Video Visit from 05/04/2021 in [facility] Office Visit from 03/17/2019 in [facility]  Total GAD-7 Score 13 14 13 12 5       PHQ2-9    [place] Visit from 09/21/2021 in [facility] Office Visit from 09/13/2021 in [facility] Office Visit from 09/07/2021 in [place] Visit from 08/29/2021 in [place] Visit from 08/16/2021 in [facility]  PHQ-2 Total Score 2 5 0 6 6  PHQ-9 Total Score -- 18 0 19 21      Flowsheet Row Video Visit from 08/31/2021 in [place] Video Visit from 07/27/2021 in [place] Office Visit from 06/19/2021 in [place] Error: Q3, 4, or 5 should not be populated when Q2 is No Error: Question 6 not populated Error: Q3, 4, or 5 should not be populated when Q2 is No       Assessment and Plan:  SANDE PICKERT is a 56 y.o. year old female with a history of depression, iron deficiency anemia,  multinodular goiter, subclinical hyperthyroidism, vitamin D deficiency, hypertension, s/p gastric bypass surgery in 2017, who presents for follow up appointment for below.   1. Anxiety 2. Moderate episode of recurrent major depressive disorder (HCC) Exam is notable for significant fatigue and rumination on her memory loss. Recent psychosocial stressors includes being denied of short-term disability, conflict with her children, and the recent loss of her aunt.  Will uptitrate rexulti to optimize treatment for depression.  Previously discussed potential metabolic side effect and EPS.  Will continue Lexapro to target depression and anxiety.  Will continue Xanax as needed for anxiety.   3. Insomnia, unspecified type Poor.  She reportedly is in the process of getting sleep study.  We will continue current dose of Lunesta to target  insomnia.   # r/o Memory loss Although her current cognitive impairment is likely secondary to worsening in depression, she is very concerned about this and does not think this is due to depression.  Unable to do physical exams due to her difficulty in coming for in person visit.  Will make referral to neurology for further evaluation to rule out any other organic cause.    I would support that she would remain out from work with expected return date on April 9th.  She has depressive symptoms with significant impairment in cognition/memory and fatigue, which interferes with her ability to work at her capacity.    Plan Continue lexapro 20 mg daily Increase rexulti 2 mg daily Continue Lunesta 2 mg at night as needed for sleep - two refills left Continue Xanax 0.25 mg in AM (used to be on 1 mg at night ) Referral to neurology for memory loss Next appointment: 1/31 at 10 AM for 30 mins, in person   The patient demonstrates the following risk factors for suicide: Chronic risk factors for suicide include: psychiatric disorder of depression. Acute risk factors for suicide include: loss (financial, interpersonal, professional). Protective factors for this patient include: responsibility to others (children, family), coping skills, hope for the future and religious beliefs against suicide. Considering these factors, the overall suicide risk at this point appears to be low. Patient is appropriate for outpatient follow up.    Norman Clay, MD  11/26/2021, 3:33 PM

## 2021-11-22 NOTE — Telephone Encounter (Signed)
Pt phoned complaining of being very nauseated  after 2 hours after drinking the Miralax she was advised to do this morning. She has only had 2 glasses so far. She stated she just couldn't drink anymore. Please advise

## 2021-11-23 ENCOUNTER — Telehealth: Payer: Self-pay

## 2021-11-23 NOTE — Telephone Encounter (Signed)
This was sent to [facility] 11-13-21

## 2021-11-23 NOTE — Telephone Encounter (Signed)
Patient called need refill Zofran 4 mg  Pharmacy: [facility] [place]

## 2021-11-23 NOTE — Progress Notes (Signed)
reschedule

## 2021-11-26 ENCOUNTER — Telehealth (INDEPENDENT_AMBULATORY_CARE_PROVIDER_SITE_OTHER): Payer: 59 | Admitting: Psychiatry

## 2021-11-26 ENCOUNTER — Encounter: Payer: Self-pay | Admitting: Gastroenterology

## 2021-11-26 ENCOUNTER — Encounter: Payer: Self-pay | Admitting: Psychiatry

## 2021-11-26 ENCOUNTER — Telehealth: Payer: Self-pay | Admitting: Psychiatry

## 2021-11-26 ENCOUNTER — Other Ambulatory Visit: Payer: Self-pay

## 2021-11-26 ENCOUNTER — Telehealth: Payer: Self-pay | Admitting: Gastroenterology

## 2021-11-26 ENCOUNTER — Encounter (HOSPITAL_COMMUNITY): Payer: Self-pay | Admitting: Hematology

## 2021-11-26 DIAGNOSIS — G4700 Insomnia, unspecified: Secondary | ICD-10-CM | POA: Diagnosis not present

## 2021-11-26 DIAGNOSIS — F419 Anxiety disorder, unspecified: Secondary | ICD-10-CM

## 2021-11-26 DIAGNOSIS — R413 Other amnesia: Secondary | ICD-10-CM | POA: Diagnosis not present

## 2021-11-26 DIAGNOSIS — F331 Major depressive disorder, recurrent, moderate: Secondary | ICD-10-CM

## 2021-11-26 MED ORDER — ESCITALOPRAM OXALATE 20 MG PO TABS: 20.0000 mg | ORAL_TABLET | Freq: Every day | ORAL | 2 refills | Status: DC

## 2021-11-26 MED ORDER — ALPRAZOLAM 0.25 MG PO TABS: 0.2500 mg | ORAL_TABLET | Freq: Two times a day (BID) | ORAL | 0 refills | Status: DC | PRN

## 2021-11-26 MED ORDER — BREXPIPRAZOLE 2 MG PO TABS: 2.0000 mg | ORAL_TABLET | Freq: Every day | ORAL | 0 refills | Status: DC

## 2021-11-26 NOTE — Telephone Encounter (Signed)
LTD form by [employer] has been filled to be faxed.

## 2021-11-26 NOTE — Telephone Encounter (Signed)
Olivia Woodward, can we contact patient and see how she is doing? This came to me after I was gone for the week. Thanks!

## 2021-11-26 NOTE — Telephone Encounter (Signed)
Glad to hear things are moving.

## 2021-11-26 NOTE — Telephone Encounter (Signed)
Phoned and spoke with the pt and was advised by her she is doing a little better. She has a BM x 2 this morning. She started the Linzess on the 6th. The Miralax made the pt too sick.

## 2021-11-26 NOTE — Telephone Encounter (Signed)
Phoned the pt and LMOVM for the pt to return call 

## 2021-11-26 NOTE — Telephone Encounter (Signed)
Olivia Woodward: has patient seen any black stool once she started having bowel movements?

## 2021-11-26 NOTE — Patient Instructions (Signed)
Continue lexapro 20 mg daily Increase rexulti 2 mg daily Continue Lunesta 2 mg at night as needed for sleep  Continue Xanax 0.25 mg in AM  Referral to neurology for memory loss Next appointment: 1/31 at 10 AM

## 2021-11-26 NOTE — Telephone Encounter (Signed)
noted 

## 2021-11-27 ENCOUNTER — Other Ambulatory Visit: Payer: Self-pay

## 2021-11-27 ENCOUNTER — Other Ambulatory Visit (HOSPITAL_COMMUNITY)
Admission: RE | Admit: 2021-11-27 | Discharge: 2021-11-27 | Disposition: A | Discharge status: Home / Self Care | Payer: 59 | Source: Ambulatory Visit | Attending: Gastroenterology | Admitting: Gastroenterology

## 2021-11-27 ENCOUNTER — Ambulatory Visit: Payer: 59 | Admitting: Family Medicine

## 2021-11-27 ENCOUNTER — Encounter: Payer: Self-pay | Admitting: Family Medicine

## 2021-11-27 ENCOUNTER — Encounter (HOSPITAL_COMMUNITY): Payer: Self-pay | Admitting: Hematology

## 2021-11-27 ENCOUNTER — Ambulatory Visit (INDEPENDENT_AMBULATORY_CARE_PROVIDER_SITE_OTHER): Payer: 59 | Admitting: Family Medicine

## 2021-11-27 VITALS — BP 134/81 | HR 91 | Ht 66.0 in | Wt 242.0 lb

## 2021-11-27 DIAGNOSIS — G4489 Other headache syndrome: Secondary | ICD-10-CM

## 2021-11-27 DIAGNOSIS — R1084 Generalized abdominal pain: Secondary | ICD-10-CM | POA: Diagnosis present

## 2021-11-27 DIAGNOSIS — I10 Essential (primary) hypertension: Secondary | ICD-10-CM

## 2021-11-27 DIAGNOSIS — R413 Other amnesia: Secondary | ICD-10-CM

## 2021-11-27 DIAGNOSIS — R110 Nausea: Secondary | ICD-10-CM | POA: Diagnosis not present

## 2021-11-27 DIAGNOSIS — D508 Other iron deficiency anemias: Secondary | ICD-10-CM | POA: Diagnosis not present

## 2021-11-27 DIAGNOSIS — F322 Major depressive disorder, single episode, severe without psychotic features: Secondary | ICD-10-CM | POA: Diagnosis not present

## 2021-11-27 LAB — FOLATE: Folate: 7.1 ng/mL (ref >5.9)

## 2021-11-27 LAB — VITAMIN B12: Vitamin B-12: 115 pg/mL — ABNORMAL LOW (ref 180–914)

## 2021-11-27 MED ORDER — ONDANSETRON HCL 4 MG PO TABS: ORAL_TABLET | ORAL | 0 refills | Status: DC

## 2021-11-27 NOTE — Assessment & Plan Note (Addendum)
Progressive memory loss reports not cooking in past 3 months and not driving because of severe memory loss needs neurology eval and brain scan

## 2021-11-27 NOTE — Progress Notes (Signed)
Olivia Woodward     MRN: [id number]      DOB: 1966-03-14   HPI [name] is here for follow up and re-evaluation of chronic medical conditions, medication management and review of any available recent lab and radiology data.  Preventive health is updated, specifically  Cancer screening and Immunization.   C/o significant short term memory loss intermittently, started in July 2022, and significantly worsening and she reports feeling scared no longer driving  since past 3 months, once she went to [facility] and could not recall how to get home which is 5 mins from the store , also c/o uncontrolled migrine headaches with nausea over the past 1 months C/o severe depression and inability to function which is worsening, being treated by Psychiatry and out on medical leave for this, does not feel as though she is improving and her disability is running out which is stressful as well  ROS Denies recent fever or chills. Denies sinus pressure, nasal congestion, ear pain or sore throat. Denies chest congestion, productive cough or wheezing. Denies chest pains, palpitations and leg swelling .   Denies dysuria, frequency, hesitancy or incontinence. Denies joint pain, swelling and limitation in mobility.  Denies skin break down or rash.   PE  BP 134/81    Pulse 91    Ht 5\' 6"  (1.676 m)    Wt 242 lb (109.8 kg)    SpO2 94%    BMI 39.06 kg/m   Patient alert and oriented and in no cardiopulmonary distress.  HEENT: No facial asymmetry, EOMI,     Neck supple .  Chest: Clear to auscultation bilaterally.  CVS: S1, S2 no murmurs, no S3.Regular rate.  ABD: Soft non tender.   Ext: No edema  MS: Adequate ROM spine, shoulders, hips and knees.  Skin: Intact, no ulcerations or rash noted.  Psych: Good eye contact, both  anxious and  depressed appearing.  CNS: CN 2-12 intact, power,  normal throughout.no focal deficits noted.   Assessment & Plan  Memory loss Progressive memory loss reports not  cooking in past 3 months and not driving because of severe memory loss needs neurology eval and brain scan  Nausea Increased nausea, associated with headaches, limited amt of zofran precribed  Essential hypertension Controlled, no change in medication DASH diet and commitment to daily physical activity for a minimum of 30 minutes discussed and encouraged, as a part of hypertension management. The importance of attaining a healthy weight is also discussed.  BP/Weight 11/27/2021 11/22/2021 10/23/2021 10/05/2021 09/21/2021 03/06/3789 12/23/971  Systolic BP 532 992 426 834 196 222 979  Diastolic BP 81 93 70 76 71 84 84  Wt. (Lbs) 242 251.2 252 251.2 253.12 252.2 253  BMI 39.06 40.54 43.26 43.12 40.85 43.29 43.43  Some encounter information is confidential and restricted. Go to Review Flowsheets activity to see all data.       Depression, major, single episode, severe ([place]) Improving an managed by Psych, she is to continue same  Headache 1 month h/o increased headache frequency and severity, brain scan and neurology eval  Morbid obesity ([place])  Patient re-educated about  the importance of commitment to a  minimum of 150 minutes of exercise per week as able.  The importance of healthy food choices with portion control discussed, as well as eating regularly and within a 12 hour window most days. The need to choose "clean , green" food 50 to 75% of the time is discussed, as well as to make  water the primary drink and set a goal of 64 ounces water daily.    Weight /BMI 11/27/2021 11/22/2021 10/23/2021  WEIGHT 242 lb 251 lb 3.2 oz 252 lb  HEIGHT 5\' 6"  5\' 6"  5\' 4"   BMI 39.06 kg/m2 40.54 kg/m2 43.26 kg/m2  Some encounter information is confidential and restricted. Go to Review Flowsheets activity to see all data.

## 2021-11-27 NOTE — Patient Instructions (Signed)
F/U in 8 to 10 weeks, call if you need me sooner  Need covid vaccine  Please stop saxenda, this will help nausea and constipation  Limited amount of zofran is prescribed  Please schedule mammogram at checkout  You are referred for brain  scan and to Neurology, we will contact you with appt info  It is important that you exercise regularly at least 30 minutes 5 times a week. If you develop chest pain, have severe difficulty breathing, or feel very tired, stop exercising immediately and seek medical attention   Thanks for choosing [facility], we consider it a privelige to serve you.

## 2021-11-27 NOTE — Telephone Encounter (Signed)
See result note.  

## 2021-11-28 ENCOUNTER — Telehealth (INDEPENDENT_AMBULATORY_CARE_PROVIDER_SITE_OTHER): Payer: 59 | Admitting: Licensed Clinical Social Worker

## 2021-11-28 DIAGNOSIS — F322 Major depressive disorder, single episode, severe without psychotic features: Secondary | ICD-10-CM

## 2021-11-28 NOTE — Progress Notes (Signed)
Patient: Home  Provider: Remote Office                Virtual Visit via Telephone Note   I connected with Olivia Woodward on 07/05/21 by telephone and verified that I am speaking with the correct person using two identifiers.

## 2021-11-28 NOTE — BH Specialist Note (Signed)
Virtual Behavioral Health Treatment Plan Team Note  MRN: [id number] NAME: Olivia Woodward  DATE: 11/28/21  Start time:   303p End time:  308p Total time:  5 min  Total number of Virtual [place] Treatment Team Plan encounters: 4/4  Treatment Team Attendees: [name], LCSW & [name]  Diagnoses: No diagnosis found.  Goals, Interventions and Follow-up Plan Goals: Increase healthy adjustment to current life circumstances Interventions: Mindfulness or Relaxation Training Behavioral Activation CBT Cognitive Behavioral Therapy Medication Management Recommendations: no change to medication Follow-up Plan: Weekly VBH session  History of the present illness Presenting Problem/Current Symptoms: depressive symptoms  Psychiatric History  Depression: Yes Anxiety: No Mania: No Psychosis: No PTSD symptoms: No  Past Psychiatric History/Hospitalization(s): Hospitalization for psychiatric illness: No Prior Suicide Attempts: No Prior Self-injurious behavior: No  Psychosocial stressors Flowsheet Row Virtual BH Phone Follow Up from 08/04/2018 in [facility]  Current Stressors Family death  [Medication is not helping her level of depression and anxiety. ]  Familial Stressors None  Sleep Decreased, Difficulty falling asleep, Difficulty staying asleep       Self-harm Behaviors Risk Assessment Flowsheet Row Virtual BH Phone Follow Up from 07/16/2018 in [facility]  Self-harm risk factors --  [None Reported]  Have you recently had any thoughts about harming yourself? No       Screenings PHQ-9 Assessments:  Depression screen [facility] 2/9 11/27/2021 09/21/2021 09/19/2021  Decreased Interest 1 1 3   Down, Depressed, Hopeless 1 1 2   PHQ - 2 Score 2 2 5   Altered sleeping 1 - 2  Tired, decreased energy 1 - 2  Change in appetite 1 - 2  Feeling bad or failure about yourself  1 - 3  Trouble concentrating 1 - 2  Moving slowly or fidgety/restless 0 - 2  Suicidal thoughts  0 - 0  PHQ-9 Score 7 - 18  Difficult doing work/chores Somewhat difficult - Extremely dIfficult  Some recent data might be hidden   GAD-7 Assessments:  GAD 7 : Generalized Anxiety Score 08/17/2021 06/26/2021 06/13/2021 05/04/2021  Nervous, Anxious, on Edge 2 2 2 2   Control/stop worrying 2 2 2 2   Worry too much - different things 2 2 2 2   Trouble relaxing 2 2 2 2   Restless 2 2 2 1   Easily annoyed or irritable 2 2 2 2   Afraid - awful might happen 1 2 1 1   Total GAD 7 Score 13 14 13 12   Anxiety Difficulty Very difficult Somewhat difficult Very difficult Very difficult    Past Medical History Past Medical History:  Diagnosis Date   Anemia    Anxiety    [employer]reesia 03/18/2021   Depression    [employer]rees[employer] 03/18/2021   Generalized headaches    Hypertension     Vital signs: There were no vitals filed for this visit.  Allergies:  Allergies as of 11/28/2021 - Review Complete 11/27/2021  Allergen Reaction Noted   Ketorolac tromethamine Hives 03/16/2009    Medication History Current medications:  Outpatient Encounter Medications as of 11/28/2021  Medication Sig   AIMOVIG 140 MG/ML SOAJ SMARTSIG:140 Milligram(s) SUB-Q Once a Month   albuterol (VENTOLIN HFA) 108 (90 Base) MCG/ACT inhaler Inhale 2 puffs into the lungs every 6 (six) hours as needed for wheezing or shortness of breath.   [START ON 12/14/2021] ALPRAZolam (XANAX) 0.25 MG tablet Take 1 tablet (0.25 mg total) by mouth 2 (two) times daily as needed for anxiety.   amLODipine (NORVASC) 10 MG tablet TAKE 1 TABLET  BY MOUTH ONCE DAILY DOSE  INCREASE  EFFECTIVE  09/11/2020   brexpiprazole (REXULTI) 2 MG TABS tablet Take 1 tablet (2 mg total) by mouth daily.   Cyanocobalamin (B-12 PO) Take 2,500 mcg by mouth daily.   ergocalciferol (VITAMIN D2) 1.25 MG (50000 UT) capsule Take 1 capsule (50,000 Units total) by mouth once a week. One capsule once weekly   escitalopram (LEXAPRO) 20 MG tablet Take 1 tablet (20 mg total) by mouth daily.    eszopiclone (LUNESTA) 2 MG TABS tablet Take 1 tablet (2 mg total) by mouth at bedtime as needed for sleep. Take immediately before bedtime   linaclotide (LINZESS) 290 MCG CAPS capsule Take 290 mcg by mouth daily before breakfast.   ondansetron (ZOFRAN) 4 MG tablet TAKE 1 TABLET BY MOUTH EVERY 8 HOURS AS NEEDED   ondansetron (ZOFRAN) 4 MG tablet Take one tablet by mouth every 12 hours as needed, for  nausea   rizatriptan (MAXALT) 10 MG tablet Take 1 tablet (10 mg total) by mouth as needed for migraine. May repeat in 2 hours if needed   spironolactone (ALDACTONE) 100 MG tablet Take 1 tablet by mouth once daily   topiramate (TOPAMAX) 100 MG tablet Take 1 tablet (100 mg total) by mouth 2 (two) times daily.   traMADol (ULTRAM) 50 MG tablet tramadol 50 mg tablet  TAKE 1 TO 2 TABLETS BY MOUTH EVERY 6 HOURS AS NEEDED FOR PAIN   No facility-administered encounter medications on file as of 11/28/2021.     Scribe for Treatment Team: Lubertha South, LCSW

## 2021-11-29 ENCOUNTER — Other Ambulatory Visit: Payer: Self-pay

## 2021-11-29 DIAGNOSIS — D508 Other iron deficiency anemias: Secondary | ICD-10-CM

## 2021-11-29 DIAGNOSIS — G9332 Myalgic encephalomyelitis/chronic fatigue syndrome: Secondary | ICD-10-CM

## 2021-11-30 NOTE — BH Specialist Note (Signed)
[facility] Follow Up Assessment  MRN: [id number] NAME: MARIEELENA BARTKO Date: 11/01/2021  Start time: 1p End time: 120p Total time: 20  Type of Contact: Follow up Call  Current concerns/stressors: poor mood    Functional Assessment:  Sleep: poor Appetite: poor Coping ability: poor Patient taking medications as prescribed:    Current medications:  Outpatient Encounter Medications as of 11/01/2021  Medication Sig   AIMOVIG 140 MG/ML SOAJ SMARTSIG:140 Milligram(s) SUB-Q Once a Month   albuterol (VENTOLIN HFA) 108 (90 Base) MCG/ACT inhaler Inhale 2 puffs into the lungs every 6 (six) hours as needed for wheezing or shortness of breath.   Cyanocobalamin (B-12 PO) Take 2,500 mcg by mouth daily.   ergocalciferol (VITAMIN D2) 1.25 MG (50000 UT) capsule Take 1 capsule (50,000 Units total) by mouth once a week. One capsule once weekly   eszopiclone (LUNESTA) 2 MG TABS tablet Take 1 tablet (2 mg total) by mouth at bedtime as needed for sleep. Take immediately before bedtime   rizatriptan (MAXALT) 10 MG tablet Take 1 tablet (10 mg total) by mouth as needed for migraine. May repeat in 2 hours if needed   spironolactone (ALDACTONE) 100 MG tablet Take 1 tablet by mouth once daily   topiramate (TOPAMAX) 100 MG tablet Take 1 tablet (100 mg total) by mouth 2 (two) times daily.   [DISCONTINUED] ALPRAZolam (XANAX) 0.25 MG tablet Take 1 tablet (0.25 mg total) by mouth 2 (two) times daily as needed for anxiety.   [DISCONTINUED] amLODipine (NORVASC) 10 MG tablet Take 1 tablet (10 mg total) by mouth daily.   [DISCONTINUED] brexpiprazole (REXULTI) 1 MG TABS tablet Take 1 tablet (1 mg total) by mouth daily.   [DISCONTINUED] escitalopram (LEXAPRO) 20 MG tablet Take 1 tablet (20 mg total) by mouth daily. Fill after 10/1   [DISCONTINUED] Liraglutide -Weight Management (SAXENDA) 18 MG/3ML SOPN Inject 0.6 mg into the skin daily. (Patient not taking: Reported on 11/22/2021)   [DISCONTINUED] Liraglutide  -Weight Management (SAXENDA) 18 MG/3ML SOPN Inject 1.2 mg into the skin daily. (Patient not taking: Reported on 11/22/2021)   [DISCONTINUED] Liraglutide -Weight Management (SAXENDA) 18 MG/3ML SOPN Inject 1.8 mg into the skin daily.   [DISCONTINUED] ondansetron (ZOFRAN) 4 MG tablet Take 1 tablet (4 mg total) by mouth every 8 (eight) hours as needed.   [DISCONTINUED] promethazine-dextromethorphan (PROMETHAZINE-DM) 6.25-15 MG/5ML syrup Take 5 mLs by mouth 4 (four) times daily as needed for cough.   No facility-administered encounter medications on file as of 11/01/2021.    Self-harm and/or Suicidal Behaviors Risk Assessment Self-harm risk factors: no Patient endorses recent self injurious thoughts and/or behaviors: No   Suicide ideations: No plan to harm self or others   Danger to Others Risk Assessment Danger to others risk factors: no Patient endorses recent thoughts of harming others: No    Substance Use Assessment Patient recently consumed alcohol: No  Patient recently used drugs: No  Patient is concerned about dependence or abuse of substances: No    Goals, Interventions and Follow-up Plan Goals: Increase healthy adjustment to current life circumstances Interventions: Mindfulness or Relaxation Training, Behavioral Activation, and CBT Cognitive Behavioral Therapy   Summary of Clinical Assessment  Denelda is present for the session.  She was open to discussing her depression and her concern for her side effects of memory los, blurry vision and dizziness.  She believes these side effects are from her medication.  She continues to be out of work and desires to return to work soon.  She is unable to engage in social outings at this time as she has lost motivation and has poor energy.  During he session, Therapist discussed the importance of behavioral activation and placing Christmas decorations around her home to help with her mood. She was open to decorating the home with her  daughter.   Follow-up Plan:  weekly follow up Lubertha South, LCSW

## 2021-11-30 NOTE — Progress Notes (Signed)
See Bryce note

## 2021-12-03 NOTE — BH Specialist Note (Signed)
[facility] Follow Up Assessment  MRN: [id number] NAME: Olivia Woodward Date: 11/07/2021  Start time: 69 End time: 1250 Total time: 20  Type of Contact: Follow up Call  Current concerns/stressors: increase in symptoms    Functional Assessment:  Sleep: poor Appetite: poor Coping ability: overwhelmed Patient taking medications as prescribed:  yes  Current medications:  Outpatient Encounter Medications as of 11/07/2021  Medication Sig   AIMOVIG 140 MG/ML SOAJ SMARTSIG:140 Milligram(s) SUB-Q Once a Month   albuterol (VENTOLIN HFA) 108 (90 Base) MCG/ACT inhaler Inhale 2 puffs into the lungs every 6 (six) hours as needed for wheezing or shortness of breath.   amLODipine (NORVASC) 10 MG tablet TAKE 1 TABLET BY MOUTH ONCE DAILY DOSE  INCREASE  EFFECTIVE  09/11/2020   Cyanocobalamin (B-12 PO) Take 2,500 mcg by mouth daily.   ergocalciferol (VITAMIN D2) 1.25 MG (50000 UT) capsule Take 1 capsule (50,000 Units total) by mouth once a week. One capsule once weekly   eszopiclone (LUNESTA) 2 MG TABS tablet Take 1 tablet (2 mg total) by mouth at bedtime as needed for sleep. Take immediately before bedtime   rizatriptan (MAXALT) 10 MG tablet Take 1 tablet (10 mg total) by mouth as needed for migraine. May repeat in 2 hours if needed   spironolactone (ALDACTONE) 100 MG tablet Take 1 tablet by mouth once daily   topiramate (TOPAMAX) 100 MG tablet Take 1 tablet (100 mg total) by mouth 2 (two) times daily.   [DISCONTINUED] ALPRAZolam (XANAX) 0.25 MG tablet Take 1 tablet (0.25 mg total) by mouth 2 (two) times daily as needed for anxiety.   [DISCONTINUED] brexpiprazole (REXULTI) 1 MG TABS tablet Take 1 tablet (1 mg total) by mouth daily.   [DISCONTINUED] escitalopram (LEXAPRO) 20 MG tablet Take 1 tablet (20 mg total) by mouth daily. Fill after 10/1   [DISCONTINUED] Liraglutide -Weight Management (SAXENDA) 18 MG/3ML SOPN Inject 0.6 mg into the skin daily. (Patient not taking: Reported on  11/22/2021)   [DISCONTINUED] Liraglutide -Weight Management (SAXENDA) 18 MG/3ML SOPN Inject 1.2 mg into the skin daily. (Patient not taking: Reported on 11/22/2021)   [DISCONTINUED] Liraglutide -Weight Management (SAXENDA) 18 MG/3ML SOPN Inject 1.8 mg into the skin daily.   [DISCONTINUED] ondansetron (ZOFRAN) 4 MG tablet Take 1 tablet (4 mg total) by mouth every 8 (eight) hours as needed.   [DISCONTINUED] promethazine-dextromethorphan (PROMETHAZINE-DM) 6.25-15 MG/5ML syrup Take 5 mLs by mouth 4 (four) times daily as needed for cough.   No facility-administered encounter medications on file as of 11/07/2021.    Self-harm and/or Suicidal Behaviors Risk Assessment Self-harm risk factors: no Patient endorses recent self injurious thoughts and/or behaviors: No   Suicide ideations: No plan to harm self or others   Danger to Others Risk Assessment Danger to others risk factors: no Patient endorses recent thoughts of harming others: No    Substance Use Assessment Patient recently consumed alcohol: No  Patient recently used drugs: No  Patient is concerned about dependence or abuse of substances: No    Goals, Interventions and Follow-up Plan Goals: Increase healthy adjustment to current life circumstances Interventions: Mindfulness or Relaxation Training, Behavioral Activation, and CBT Cognitive Behavioral Therapy   Summary of Clinical Assessment  Olivia Woodward was present for the session. She reports that she has not completed any task discussed in previous session (Christmas decorations around her home).  She reports that she asked her daughter to put up the decorations.  She also reports continued dizziness, forgetfulness and increase in sleep patterns.  She was focused on paperwork for her job to obtain Short term disability.  She wants to feel better emotionally but has no motivation, energy when she attempts to make any progress.   Follow-up Plan:  weekly VBH session; IOP would be beneficial as  well Lubertha South, LCSW

## 2021-12-03 NOTE — Progress Notes (Signed)
See [place] note

## 2021-12-04 ENCOUNTER — Other Ambulatory Visit: Payer: Self-pay | Admitting: Gastroenterology

## 2021-12-04 MED ORDER — LINACLOTIDE 290 MCG PO CAPS: 290.0000 ug | ORAL_CAPSULE | Freq: Every day | ORAL | 3 refills | Status: DC

## 2021-12-05 ENCOUNTER — Ambulatory Visit (HOSPITAL_COMMUNITY)
Admission: RE | Admit: 2021-12-05 | Discharge: 2021-12-05 | Disposition: A | Discharge status: Home / Self Care | Payer: 59 | Source: Ambulatory Visit | Attending: Family Medicine | Admitting: Family Medicine

## 2021-12-05 ENCOUNTER — Telehealth: Payer: Self-pay | Admitting: Family Medicine

## 2021-12-05 ENCOUNTER — Other Ambulatory Visit: Payer: Self-pay

## 2021-12-05 ENCOUNTER — Telehealth: Payer: Self-pay | Admitting: Internal Medicine

## 2021-12-05 DIAGNOSIS — Z1231 Encounter for screening mammogram for malignant neoplasm of breast: Secondary | ICD-10-CM | POA: Insufficient documentation

## 2021-12-05 NOTE — Telephone Encounter (Signed)
Pt said her Linzess prescription was going to cost her over $1000 and was there something else cheaper to call into [facility]. Please advise. [phone]

## 2021-12-05 NOTE — Telephone Encounter (Signed)
Seth Bake is calling to advise that the order for the Mri w\wo contrast needs to be faxed to [facility]   If you have ? Call Seth Bake at [phone]

## 2021-12-05 NOTE — Telephone Encounter (Signed)
Faxed to [facility]

## 2021-12-06 ENCOUNTER — Encounter (HOSPITAL_COMMUNITY): Payer: Self-pay | Admitting: Hematology

## 2021-12-06 ENCOUNTER — Telehealth: Payer: Self-pay | Admitting: Psychiatry

## 2021-12-06 NOTE — Telephone Encounter (Signed)
Good Morning Magda Paganini, You haven't seen this pt--Anna has but she is off today and I just a different Rx for constipation sent in. Linzess 290 mcg is not requring a PA. The Rx is $1000.00. She does need a PA for Trulance, [place], Motegrity, Lubiprostone and Bisacodyl. Or I can wait until tomorrow for Vicente Males. Please advise

## 2021-12-06 NOTE — Telephone Encounter (Signed)
Dena, I am just seeing this tonight. Please address with Vicente Males tomorrow since she recently saw the patient. Thanks.

## 2021-12-06 NOTE — Telephone Encounter (Signed)
Received a form to fill for long-term disability.  The form was filled several days ago, and it should have been faxed.  She will contact them to make sure they received a fax, if not, please rescinded again.

## 2021-12-07 MED ORDER — LUBIPROSTONE 24 MCG PO CAPS: 24.0000 ug | ORAL_CAPSULE | Freq: Two times a day (BID) | ORAL | 3 refills | Status: DC

## 2021-12-07 NOTE — Telephone Encounter (Signed)
Amitiza 24 mcg po BID to take with food sent to pharmacy. It looks like this is on her formulary?

## 2021-12-07 NOTE — Telephone Encounter (Signed)
Noted  Olivia Woodward,  This pt needs another Rx from her formulary which I have listed below.  thanks

## 2021-12-07 NOTE — Telephone Encounter (Signed)
Phoned and spoke with the pt and advised of Rx being sent in with instructions of use. Pt expressed understanding.

## 2021-12-07 NOTE — Addendum Note (Signed)
Addended by: Annitta Needs on: 12/07/2021 11:56 AM   Modules accepted: Orders

## 2021-12-08 ENCOUNTER — Encounter: Payer: Self-pay | Admitting: Family Medicine

## 2021-12-08 NOTE — Assessment & Plan Note (Signed)
Increased nausea, associated with headaches, limited amt of zofran precribed

## 2021-12-08 NOTE — Assessment & Plan Note (Signed)
Improving an managed by Psych, she is to continue same

## 2021-12-08 NOTE — Assessment & Plan Note (Signed)
1 month h/o increased headache frequency and severity, brain scan and neurology eval

## 2021-12-08 NOTE — Assessment & Plan Note (Signed)
°  Patient re-educated about  the importance of commitment to a  minimum of 150 minutes of exercise per week as able.  The importance of healthy food choices with portion control discussed, as well as eating regularly and within a 12 hour window most days. The need to choose "clean , green" food 50 to 75% of the time is discussed, as well as to make water the primary drink and set a goal of 64 ounces water daily.    Weight /BMI 11/27/2021 11/22/2021 10/23/2021  WEIGHT 242 lb 251 lb 3.2 oz 252 lb  HEIGHT 5\' 6"  5\' 6"  5\' 4"   BMI 39.06 kg/m2 40.54 kg/m2 43.26 kg/m2  Some encounter information is confidential and restricted. Go to Review Flowsheets activity to see all data.

## 2021-12-08 NOTE — Assessment & Plan Note (Signed)
Controlled, no change in medication DASH diet and commitment to daily physical activity for a minimum of 30 minutes discussed and encouraged, as a part of hypertension management. The importance of attaining a healthy weight is also discussed.  BP/Weight 11/27/2021 11/22/2021 10/23/2021 10/05/2021 09/21/2021 0/45/9977 02/16/4238  Systolic BP 532 023 343 568 616 837 290  Diastolic BP 81 93 70 76 71 84 84  Wt. (Lbs) 242 251.2 252 251.2 253.12 252.2 253  BMI 39.06 40.54 43.26 43.12 40.85 43.29 43.43  Some encounter information is confidential and restricted. Go to Review Flowsheets activity to see all data.

## 2021-12-11 NOTE — Telephone Encounter (Signed)
Tried to do a PA on the pt's Amitiza, but due to her insurance it states all they will  cover is Linzess and Trulance. So I spoke with the pharmacist at [facility] and they advised [place] to give her a discount card to activate and bring in. I phoned the pt and advised her to come by here and pick up this card and take it to [facility]. Pt agreed and the card is up front waiting on her. So we will see how this plays out before we go to next and only Rx left for her on her insurance which is Trulance.

## 2021-12-11 NOTE — Telephone Encounter (Signed)
Olivia Woodward, Cover My Meds and the pt's insurance is stating that the preferred for the pt is Linzess and Trulance. Will still do a PA for Amitiza.

## 2021-12-12 ENCOUNTER — Telehealth: Payer: Self-pay

## 2021-12-12 ENCOUNTER — Telehealth: Payer: Self-pay | Admitting: Family Medicine

## 2021-12-12 NOTE — Telephone Encounter (Signed)
[facility] Pre cert is calling states that the wrong NPI was used on Pre Cert for Brain scan    Please use NPI [id number]

## 2021-12-12 NOTE — Telephone Encounter (Signed)
No number to call back to let them know that the insurance required she get the test at [facility]

## 2021-12-12 NOTE — Telephone Encounter (Signed)
Auth # [id number] for [place], scheduled for 1/27 at 8am

## 2021-12-12 NOTE — Telephone Encounter (Signed)
error 

## 2021-12-12 NOTE — Telephone Encounter (Signed)
Patient called said the MRI of brain is scheduled for Friday, 1/272/2023 said not authorized with [facility].  Needs a new prior authorization. Wants this done at [facility]. Please contact patient at [phone]

## 2021-12-14 ENCOUNTER — Other Ambulatory Visit: Payer: Self-pay

## 2021-12-14 ENCOUNTER — Ambulatory Visit (HOSPITAL_COMMUNITY)
Admission: RE | Admit: 2021-12-14 | Discharge: 2021-12-14 | Disposition: A | Discharge status: Home / Self Care | Payer: Managed Care, Other (non HMO) | Source: Ambulatory Visit | Attending: Family Medicine | Admitting: Family Medicine

## 2021-12-14 ENCOUNTER — Ambulatory Visit (HOSPITAL_COMMUNITY): Payer: Managed Care, Other (non HMO)

## 2021-12-14 DIAGNOSIS — R413 Other amnesia: Secondary | ICD-10-CM | POA: Insufficient documentation

## 2021-12-14 MED ORDER — GADOBUTROL 1 MMOL/ML IV SOLN
10.0000 mL | Freq: Once | INTRAVENOUS | Status: AC | PRN
  Administered 2021-12-14: 10 mL via INTRAVENOUS

## 2021-12-14 NOTE — Progress Notes (Signed)
BH MD/PA/NP OP Progress Note  12/18/2021 12:13 PM Olivia Woodward  MRN:  [id number]  Chief Complaint:  Chief Complaint   Follow-up; Depression    HPI:  This is a follow-up appointment for depression.  She states that she is not doing well.  She does not have any desire to do anything.  When she is asked about her medication adherence, she states that her daughter helps her to take medication every day.  She was unable to elaborate whether or not her disability is approved, although she has been communicating with some person to assist this. She complains of insomnia.  When she was asked if she was able to schedule for sleep study, she was unsure of this.  She continues to have depressive symptoms as in PHQ-9.  Although she reports passive SI due to her mood symptoms she experiences, she adamantly denies any plan or intent.  She agrees to contact emergency resources if needed.  She denies any hallucinations or paranoia.  She continues to have significant difficulty in memory loss.  She has not noticed any side effect from rexulti.  She continues to have poor appetite.  She would like to try anything which could be helpful for her mood.  She agrees for [facility] referral at this time.    Orientation- "2023, January, Monday (today is Tuesday), unable to tell the date. She guesses that it is "middle of the month"  Mini cog- recall 0/3 (unable to recall with cues either), drawing clock 3/4 (put two "12" in the clock. She drew the clock while occasionally looking at the clock in the exam room)  Brain MRI 11/2021 FINDINGS: Brain: No acute infarct, mass effect or extra-axial collection. No acute or chronic hemorrhage. Normal white matter signal, parenchymal volume and CSF spaces. The midline structures are normal. There is no abnormal contrast enhancement.   Wt Readings from Last 3 Encounters:  12/18/21 251 lb 3.2 oz (113.9 kg)  11/27/21 242 lb (109.8 kg)  11/22/21 251 lb 3.2 oz (113.9 kg)    Visit  Diagnosis:    ICD-10-CM   1. Severe episode of recurrent major depressive disorder, without psychotic features ([place])  F33.2     2. Anxiety  F41.9       Past Psychiatric History: Please see initial evaluation for full details. I have reviewed the history. No updates at this time.     Past Medical History:  Past Medical History:  Diagnosis Date   Anemia    Anxiety    Phreesia 03/18/2021   Depression    Phreesia 03/18/2021   Generalized headaches    Hypertension     Past Surgical History:  Procedure Laterality Date   ABDOMINAL HYSTERECTOMY     fibroids, partial   BREAST REDUCTION SURGERY  2005   COLONOSCOPY WITH PROPOFOL N/A 05/10/2019   Two 5 to 6 mm polyps in the sigmoid colon and in the ascending colon, removed with a   GASTRIC BYPASS  2007   POLYPECTOMY  05/10/2019   Procedure: POLYPECTOMY;  Surgeon: Daneil Dolin, MD;  Location: [facility];  Service: Endoscopy;;  colon    Family Psychiatric History: Please see initial evaluation for full details. I have reviewed the history. No updates at this time.     Family History:  Family History  Problem Relation Age of Onset   Hypertension Mother    Diabetes Mother    Drug abuse Mother    Hypertension Father    Hypertension Sister  Hypertension Sister    Colon cancer Neg Hx     Social History:  Social History   Socioeconomic History   Marital status: Single    Spouse name: Not on file   Number of children: 2   Years of education: 12   Highest education level: Not on file  Occupational History   Not on file  Tobacco Use   Smoking status: Never   Smokeless tobacco: Never  Vaping Use   Vaping Use: Never used  Substance and Sexual Activity   Alcohol use: No   Drug use: No   Sexual activity: Yes    Birth control/protection: Condom  Other Topics Concern   Not on file  Social History Narrative   Right handed   Caffeine use: tea/soda twice weekly   Social Determinants of Health   Financial Resource  Strain: Not on file  Food Insecurity: Not on file  Transportation Needs: Not on file  Physical Activity: Not on file  Stress: Not on file  Social Connections: Not on file    Allergies:  Allergies  Allergen Reactions   Ketorolac Tromethamine Hives    Lips swelled    Metabolic Disorder Labs: Lab Results  Component Value Date   HGBA1C 5.4 03/22/2021   No results found for: PROLACTIN Lab Results  Component Value Date   CHOL 164 03/22/2021   TRIG 73 03/22/2021   HDL 69 03/22/2021   CHOLHDL 2.4 03/22/2021   VLDL 10 01/02/2017   LDLCALC 81 03/22/2021   LDLCALC 124 (H) 09/06/2020   Lab Results  Component Value Date   TSH 2.542 10/04/2021   TSH 0.820 08/02/2021    Therapeutic Level Labs: No results found for: LITHIUM No results found for: VALPROATE No components found for:  CBMZ  Current Medications: Current Outpatient Medications  Medication Sig Dispense Refill   AIMOVIG 140 MG/ML SOAJ SMARTSIG:140 Milligram(s) SUB-Q Once a Month     ALPRAZolam (XANAX) 0.25 MG tablet Take 1 tablet (0.25 mg total) by mouth 2 (two) times daily as needed for anxiety. 60 tablet 0   amLODipine (NORVASC) 10 MG tablet TAKE 1 TABLET BY MOUTH ONCE DAILY DOSE  INCREASE  EFFECTIVE  09/11/2020 90 tablet 0   brexpiprazole (REXULTI) 2 MG TABS tablet Take 1 tablet (2 mg total) by mouth daily. 30 tablet 0   Cyanocobalamin (B-12 PO) Take 2,500 mcg by mouth daily.     escitalopram (LEXAPRO) 20 MG tablet Take 1 tablet (20 mg total) by mouth daily. 30 tablet 2   eszopiclone (LUNESTA) 2 MG TABS tablet Take 1 tablet (2 mg total) by mouth at bedtime as needed for sleep. Take immediately before bedtime 30 tablet 2   linaclotide (LINZESS) 290 MCG CAPS capsule Take 1 capsule (290 mcg total) by mouth daily before breakfast. 90 capsule 3   lubiprostone (AMITIZA) 24 MCG capsule Take 1 capsule (24 mcg total) by mouth 2 (two) times daily with a meal. 60 capsule 3   ondansetron (ZOFRAN) 4 MG tablet Take one tablet by  mouth every 12 hours as needed, for  nausea 30 tablet 0   rizatriptan (MAXALT) 10 MG tablet Take 1 tablet (10 mg total) by mouth as needed for migraine. May repeat in 2 hours if needed 10 tablet 0   spironolactone (ALDACTONE) 100 MG tablet Take 1 tablet by mouth once daily 30 tablet 0   topiramate (TOPAMAX) 100 MG tablet Take 1 tablet (100 mg total) by mouth 2 (two) times daily. 60 tablet 3  Vitamin D, Ergocalciferol, (DRISDOL) 1.25 MG (50000 UNIT) CAPS capsule Take 1 capsule by mouth once a week 12 capsule 0   traMADol (ULTRAM) 50 MG tablet tramadol 50 mg tablet  TAKE 1 TO 2 TABLETS BY MOUTH EVERY 6 HOURS AS NEEDED FOR PAIN     No current facility-administered medications for this visit.     Musculoskeletal: Strength & Muscle Tone: within normal limits Gait & Station: normal Patient leans: N/A  Psychiatric Specialty Exam: Review of Systems  Psychiatric/Behavioral:  Positive for decreased concentration, dysphoric mood, sleep disturbance and suicidal ideas. Negative for agitation, behavioral problems, confusion, hallucinations and self-injury. The patient is nervous/anxious. The patient is not hyperactive.   All other systems reviewed and are negative.  Blood pressure (!) 163/104, pulse 86, temperature 98.5 F (36.9 C), temperature source Temporal, weight 251 lb 3.2 oz (113.9 kg).Body mass index is 40.54 kg/m.  General Appearance: Fairly Groomed, not in acute distress  Eye Contact:  Good  Speech:  Clear and Coherent  Volume:  Decreased  Mood:   "same"  Affect:  Appropriate, Congruent, Restricted, and down  Thought Process:  Coherent  Orientation:  Other:  oriented to self, situation  Thought Content: Logical   Suicidal Thoughts:  No  Homicidal Thoughts:  No  Memory:  Immediate;   Good  Judgement:  Good  Insight:  Present  Psychomotor Activity:  Normal (less delay) no rigidity, no tremors.no clonus  Concentration:  Concentration: Fair and Attention Span: Fair  Recall:  Poor   Fund of Knowledge: Good  Language: Good  Akathisia:  No  Handed:  Right  AIMS (if indicated): 0  Assets:  Desire for Improvement  ADL's:  Intact  Cognition: Impaired,  Mild  Sleep:  Poor   Screenings: GAD-7    Flowsheet Row Office Visit from 08/16/2021 in [facility] Phone Follow Up from 06/26/2021 in [facility] Video Visit from 06/12/2021 in [facility] Video Visit from 05/04/2021 in [facility] Office Visit from 03/17/2019 in [facility]  Total GAD-7 Score 13 14 13 12 5       PHQ2-9    [place] Visit from 12/18/2021 in [facility] Office Visit from 11/27/2021 in [facility] Office Visit from 09/21/2021 in [facility] Office Visit from 09/13/2021 in [facility] Office Visit from 09/07/2021 in [facility]  PHQ-2 Total Score 6 2 2 5  0  PHQ-9 Total Score 20 7 -- 18 0      Flowsheet Row Video Visit from 08/31/2021 in [facility] Video Visit from 07/27/2021 in [facility] Office Visit from 06/19/2021 in [facility] Error: Q3, 4, or 5 should not be populated when Q2 is No Error: Question 6 not populated Error: Q3, 4, or 5 should not be populated when Q2 is No        Assessment and Plan:  TRACE CEDERBERG is a 56 y.o. year old female with a history of depression, iron deficiency anemia,  multinodular goiter, subclinical hyperthyroidism, vitamin D deficiency, hypertension, s/p gastric bypass surgery in 2017, who presents for follow up appointment for below.    1. Severe episode of recurrent major depressive disorder, without psychotic features ([place]) 2. Anxiety Exam is notable for impairment in delayed recall, and she continues to report depressive symptoms, although there is slight improvement in affect regulation since up titration  of rexulti. Recent psychosocial  stressors includes being denied of short-term disability, conflict with her children, and the recent loss of her aunt.  We will continue current dose of rexulti to see if it is exerts its full benefit given it has recently been uptitrated.  Will continue Lexapro to target depression and anxiety.  Will continue Xanax as needed for anxiety.  She will be a great candidate for TMS due to depression refractory to pharmacological treatment.  She agrees for this referral.   # Insomnia She continues to have middle insomnia.  She is in the process of having sleep study.  Will continue current dose of Lunesta to target insomnia.    # Cognitive impairment # vitamin B 12 deficiency She has cognitive deficits as a mini cog.  Differential includes depression given its subacute onset. No parkinsonian features or myoclonus on physical exam to be concerned of lewy body dementia or CJD. Recent MRI with no significant abnormality to explain her cognitive impairment.  She has an upcoming appointment with her neurologist. Of note, she was found to have low vitamin B12. She has started to take OTC vitamin B12. Will continue to monitor.  # HTN She is followed by her PCP. Will continue to monitor.   I would support that she would remain out from work with expected return date on April 9th.  She has depressive symptoms with significant impairment in cognition/memory and fatigue, which interferes with her ability to work at her capacity.    Plan Continue lexapro 20 mg daily Continue rexulti 2 mg daily Continue Lunesta 2 mg at night as needed for sleep - two refills left Continue Xanax 0.25 mg in AM (used to be on 1 mg at night ) Referral to [facility] Next appointment: 2/21 at 2:30 for 30 mins, in person   Past trials of medication: sertraline fluoxetine, lexapro, venlafaxine ("funny"), bupropion (dry mouth, nausea), quetiapine (drowsiness), Abilify (nausea, constipation, tinnitus), Xanax,  temazepam, Trazodone, Ambien, Belsomra (could not afford)   The patient demonstrates the following risk factors for suicide: Chronic risk factors for suicide include: psychiatric disorder of depression. Acute risk factors for suicide include: loss (financial, interpersonal, professional). Protective factors for this patient include: responsibility to others (children, family), coping skills, hope for the future and religious beliefs against suicide. Considering these factors, the overall suicide risk at this point appears to be low. Patient is appropriate for outpatient follow up.        Norman Clay, MD 12/18/2021, 12:13 PM

## 2021-12-15 ENCOUNTER — Other Ambulatory Visit: Payer: Self-pay | Admitting: Family Medicine

## 2021-12-17 ENCOUNTER — Ambulatory Visit (HOSPITAL_COMMUNITY): Payer: 59

## 2021-12-18 ENCOUNTER — Encounter: Payer: Self-pay | Admitting: Psychiatry

## 2021-12-18 ENCOUNTER — Other Ambulatory Visit: Payer: Self-pay

## 2021-12-18 ENCOUNTER — Telehealth: Payer: Self-pay

## 2021-12-18 ENCOUNTER — Encounter (HOSPITAL_COMMUNITY): Payer: Self-pay | Admitting: Hematology

## 2021-12-18 ENCOUNTER — Ambulatory Visit (INDEPENDENT_AMBULATORY_CARE_PROVIDER_SITE_OTHER): Payer: Managed Care, Other (non HMO) | Admitting: Psychiatry

## 2021-12-18 VITALS — BP 163/104 | HR 86 | Temp 98.5°F | Wt 251.2 lb

## 2021-12-18 DIAGNOSIS — F332 Major depressive disorder, recurrent severe without psychotic features: Secondary | ICD-10-CM

## 2021-12-18 DIAGNOSIS — F419 Anxiety disorder, unspecified: Secondary | ICD-10-CM

## 2021-12-18 MED ORDER — BREXPIPRAZOLE 2 MG PO TABS: 2.0000 mg | ORAL_TABLET | Freq: Every day | ORAL | 0 refills | Status: DC

## 2021-12-18 NOTE — Addendum Note (Signed)
Addended by: Norman Clay on: 12/18/2021 12:16 PM   Modules accepted: Orders

## 2021-12-18 NOTE — Telephone Encounter (Signed)
states she needs to do a pee to peer meeting with you about notes that you have on this patient for disability.

## 2021-12-18 NOTE — Telephone Encounter (Signed)
Please schedule a meeting in available slot. Please also make sure that we have a consent form from the patient about this.

## 2021-12-20 ENCOUNTER — Telehealth (HOSPITAL_COMMUNITY): Payer: Self-pay

## 2021-12-20 ENCOUNTER — Telehealth: Payer: Self-pay

## 2021-12-20 NOTE — Telephone Encounter (Signed)
could hardly hear her on the phone. but said something about the new medication you started her own is $714 and she can not afford that.

## 2021-12-20 NOTE — Telephone Encounter (Signed)
Pt was called to conduct a phone interview for [facility]. Pt meets criteria for [facility] and will be scheduled for a consult with Dr. Dwyane Dee.

## 2021-12-20 NOTE — Telephone Encounter (Signed)
Called the patient. She states that she cannot offer Aimovig, which is not prescribed by this Probation officer. She is advised to discuss with her PCP.

## 2022-01-01 ENCOUNTER — Encounter (HOSPITAL_COMMUNITY): Payer: Self-pay | Admitting: Hematology

## 2022-01-02 ENCOUNTER — Telehealth (INDEPENDENT_AMBULATORY_CARE_PROVIDER_SITE_OTHER): Payer: 59 | Admitting: Licensed Clinical Social Worker

## 2022-01-02 DIAGNOSIS — F322 Major depressive disorder, single episode, severe without psychotic features: Secondary | ICD-10-CM

## 2022-01-02 NOTE — Telephone Encounter (Signed)
Contacted [facility] on this day; Unable to leave message

## 2022-01-04 NOTE — Progress Notes (Addendum)
Virtual Visit via Video Note  I connected with Olivia Woodward on 01/08/22 at  2:30 PM EST by a video enabled telemedicine application and verified that I am speaking with the correct person using two identifiers.  Location: Patient: home Provider: office Persons participated in the visit- patient, provider    I discussed the limitations of evaluation and management by telemedicine and the availability of in person appointments. The patient expressed understanding and agreed to proceed.   I discussed the assessment and treatment plan with the patient. The patient was provided an opportunity to ask questions and all were answered. The patient agreed with the plan and demonstrated an understanding of the instructions.   The patient was advised to call back or seek an in-person evaluation if the symptoms worsen or if the condition fails to improve as anticipated.  I provided 20 minutes of non-face-to-face time during this encounter.   Norman Clay, MD    [facility] MD/PA/NP OP Progress Note  01/08/2022 3:09 PM Olivia Woodward  MRN:  [id number]  Chief Complaint:  Chief Complaint  Patient presents with   Depression   Follow-up   HPI:  This is a follow-up appointment for depression.  She was initially out of camera when starting the visit.  When she was asked to turn her camera to show her face, she states that this writer stated in the note that she had good eye contact in the last visit. She states that she was denied of disability due to this writer's note as it says there is "no problem" according to them.  She reportedly also read the note that stating "there is nothing wrong with me."  She states that "you don't really know what is going with me through video."  She was reminded that although this appointment was originally scheduled for in person visit as the last time,this was changed by the patient. She states that she could not arrange people to bring her to the clinic this time.  She  reports her frustration that she does not feel heard. Validated her frustration in related to her current mental state and being denied of disability.  She states that she does "not care anymore."  Although she reports passive SI, she denies any plan or intent.  Although she verbalized understanding of emergency resources if any worsening in SI, she states that "I don't know as it will not change my situation."  However, she again denies any intent or plan of SI.  She states that she will not be able to afford Rexulti anymore. She does not want to try any other medication,which could potentially cause weight gain.  She has initial and middle insomnia.  She feels depressed.  She denies AH, VH or paranoia.  She feels anxious and tense.  She takes Xanax regularly for anxiety.  She denies alcohol use or other drug use.  She states that she has not heard back regarding [facility] appointment; she is willing to have this treatment.   Cognition-she repeatedly states "I don't know" when she is asked about question.  She frequently asked this writer to repeat the question as she does not recall them.  Noted that she could not recall that she talked with this writer in early Feb when she reached out to our clinic regarding concern about her medication.       Visit Diagnosis:    ICD-10-CM   1. Severe episode of recurrent major depressive disorder, without psychotic features ([place])  F33.2  2. Anxiety  F41.9 ALPRAZolam (XANAX) 0.25 MG tablet    3. Insomnia, unspecified type  G47.00       Past Psychiatric History: Please see initial evaluation for full details. I have reviewed the history. No updates at this time.     Past Medical History:  Past Medical History:  Diagnosis Date   Anemia    Anxiety    Phreesia 03/18/2021   Depression    Phreesia 03/18/2021   Generalized headaches    Hypertension     Past Surgical History:  Procedure Laterality Date   ABDOMINAL HYSTERECTOMY     fibroids, partial    BREAST REDUCTION SURGERY  2005   COLONOSCOPY WITH PROPOFOL N/A 05/10/2019   Two 5 to 6 mm polyps in the sigmoid colon and in the ascending colon, removed with a   GASTRIC BYPASS  2007   POLYPECTOMY  05/10/2019   Procedure: POLYPECTOMY;  Surgeon: Daneil Dolin, MD;  Location: [facility];  Service: Endoscopy;;  colon    Family Psychiatric History: Please see initial evaluation for full details. I have reviewed the history. No updates at this time.     Family History:  Family History  Problem Relation Age of Onset   Hypertension Mother    Diabetes Mother    Drug abuse Mother    Hypertension Father    Hypertension Sister    Hypertension Sister    Colon cancer Neg Hx     Social History:  Social History   Socioeconomic History   Marital status: Single    Spouse name: Not on file   Number of children: 2   Years of education: 12   Highest education level: Not on file  Occupational History   Not on file  Tobacco Use   Smoking status: Never   Smokeless tobacco: Never  Vaping Use   Vaping Use: Never used  Substance and Sexual Activity   Alcohol use: No   Drug use: No   Sexual activity: Yes    Birth control/protection: Condom  Other Topics Concern   Not on file  Social History Narrative   Right handed   Caffeine use: tea/soda twice weekly   Social Determinants of Health   Financial Resource Strain: Not on file  Food Insecurity: Not on file  Transportation Needs: Not on file  Physical Activity: Not on file  Stress: Not on file  Social Connections: Not on file    Allergies:  Allergies  Allergen Reactions   Ketorolac Tromethamine Hives    Lips swelled    Metabolic Disorder Labs: Lab Results  Component Value Date   HGBA1C 5.4 03/22/2021   No results found for: PROLACTIN Lab Results  Component Value Date   CHOL 164 03/22/2021   TRIG 73 03/22/2021   HDL 69 03/22/2021   CHOLHDL 2.4 03/22/2021   VLDL 10 01/02/2017   LDLCALC 81 03/22/2021   LDLCALC 124  (H) 09/06/2020   Lab Results  Component Value Date   TSH 2.542 10/04/2021   TSH 0.820 08/02/2021    Therapeutic Level Labs: No results found for: LITHIUM No results found for: VALPROATE No components found for:  CBMZ  Current Medications: Current Outpatient Medications  Medication Sig Dispense Refill   AIMOVIG 140 MG/ML SOAJ SMARTSIG:140 Milligram(s) SUB-Q Once a Month     [START ON 01/14/2022] ALPRAZolam (XANAX) 0.25 MG tablet Take 1 tablet (0.25 mg total) by mouth 2 (two) times daily as needed for anxiety. 60 tablet 0   amLODipine (  NORVASC) 10 MG tablet TAKE 1 TABLET BY MOUTH ONCE DAILY DOSE  INCREASE  EFFECTIVE  09/11/2020 90 tablet 0   Cyanocobalamin (B-12 PO) Take 2,500 mcg by mouth daily.     escitalopram (LEXAPRO) 20 MG tablet Take 1 tablet (20 mg total) by mouth daily. 30 tablet 2   eszopiclone (LUNESTA) 2 MG TABS tablet Take 1 tablet (2 mg total) by mouth at bedtime as needed for sleep. Take immediately before bedtime 30 tablet 2   linaclotide (LINZESS) 290 MCG CAPS capsule Take 1 capsule (290 mcg total) by mouth daily before breakfast. 90 capsule 3   lubiprostone (AMITIZA) 24 MCG capsule Take 1 capsule (24 mcg total) by mouth 2 (two) times daily with a meal. 60 capsule 3   ondansetron (ZOFRAN) 4 MG tablet Take one tablet by mouth every 12 hours as needed, for  nausea 30 tablet 0   rizatriptan (MAXALT) 10 MG tablet Take 1 tablet (10 mg total) by mouth as needed for migraine. May repeat in 2 hours if needed 10 tablet 0   spironolactone (ALDACTONE) 100 MG tablet Take 1 tablet by mouth once daily 30 tablet 0   topiramate (TOPAMAX) 100 MG tablet Take 1 tablet (100 mg total) by mouth 2 (two) times daily. 60 tablet 3   traMADol (ULTRAM) 50 MG tablet tramadol 50 mg tablet  TAKE 1 TO 2 TABLETS BY MOUTH EVERY 6 HOURS AS NEEDED FOR PAIN     Vitamin D, Ergocalciferol, (DRISDOL) 1.25 MG (50000 UNIT) CAPS capsule Take 1 capsule by mouth once a week 12 capsule 0   No current  facility-administered medications for this visit.     Musculoskeletal: Strength & Muscle Tone:  N/A Gait & Station:  N/A Patient leans: N/A  Psychiatric Specialty Exam: Review of Systems  Psychiatric/Behavioral:  Positive for decreased concentration, dysphoric mood, sleep disturbance and suicidal ideas. Negative for agitation, behavioral problems, confusion, hallucinations and self-injury. The patient is nervous/anxious. The patient is not hyperactive.   All other systems reviewed and are negative.  There were no vitals taken for this visit.There is no height or weight on file to calculate BMI.  General Appearance: Fairly Groomed  Eye Contact:  Minimal  Speech:  Clear and Coherent  Volume:  Normal  Mood:   "not care"  Affect:  Appropriate, Congruent, and Restricted  Thought Process:  Coherent  Orientation:  Full (Time, Place, and Person)  Thought Content: Logical   Suicidal Thoughts:  Yes.  without intent/plan  Homicidal Thoughts:  No  Memory:  Immediate;   Poor  Judgement:  Fair  Insight:  Shallow  Psychomotor Activity:  Normal  Concentration:  Concentration: Poor and Attention Span: Poor  Recall:  Poor  Fund of Knowledge: Good  Language: Good  Akathisia:  No  Handed:  Right  AIMS (if indicated): not done  Assets:  Communication Skills Desire for Improvement  ADL's:  Intact  Cognition: WNL  Sleep:  Poor   Screenings: GAD-7    Flowsheet Row Office Visit from 08/16/2021 in [place] [facility] [place] [facility] Follow Up from 06/26/2021 in [facility] Video Visit from 06/12/2021 in [facility] Video Visit from 05/04/2021 in [facility] Office Visit from 03/17/2019 in [facility]  Total GAD-7 Score 13 14 13 12 5       PHQ2-9    [place] Office Visit from 12/18/2021 in [facility] from 11/27/2021 in [facility] Office Visit from 09/21/2021  in [facility] Office Visit from 09/13/2021 in [facility] Office Visit from 09/07/2021 in [facility]  PHQ-2 Total Score 6 2 2 5  0  PHQ-9 Total Score 20 7 -- 18 0      Flowsheet Row Video Visit from 08/31/2021 in [place] Video Visit from 07/27/2021 in [facility] Office Visit from 06/19/2021 in [place] Error: Q3, 4, or 5 should not be populated when Q2 is No Error: Question 6 not populated Error: Q3, 4, or 5 should not be populated when Q2 is No        Assessment and Plan:  MERCADES BAJAJ is a 56 y.o. year old female with a history of depression, iron deficiency anemia,  multinodular goiter, subclinical hyperthyroidism, vitamin D deficiency, hypertension, s/p gastric bypass surgery in 2017, who presents for follow up appointment for below.     2. Severe episode of recurrent major depressive disorder, without psychotic features ([place]) 1. Anxiety Exam is notable for significant impairment in delayed recall, and she continues to report depressive symptoms. Recent psychosocial stressors includes being denied of short-term disability, conflict with her children, and the recent loss of her aunt.  Although she did have some benefit from rexulti, she is unable to afford this medication anymore.  She is not interested in trying any other medication which could potentially cause metabolic side effect.  Will hold rexulti; she is advised to wean off this medication to avoid any discontinuation symptoms.  Will not change medication otherwise given the option is limited with the hope that she will be able to start [place] soon. Will continue Lexapro to target depression and anxiety.  Will continue Xanax as needed for anxiety.    3. Insomnia, unspecified type She continues to have initial and middle insomnia.  She is in the process of sleep evaluation.  Will continue current dose of Lunesta to  target insomnia.    # Cognitive impairment # vitamin B 12 deficiency Unchanged. She has cognitive deficits as a mini cog.  Differential includes depression given its subacute onset. No parkinsonian features or myoclonus on physical exam to be concerned of lewy body dementia or CJD. Recent MRI with no significant abnormality to explain her cognitive impairment.  She has an upcoming appointment with her neurologist. Of note, she was found to have low vitamin B12. She has started to take OTC vitamin B12. Will continue to monitor.   I would support that she would remain out from work with expected return date on May 21st.  She has depressive symptoms with significant impairment in cognition/memory and fatigue, which interferes with her ability to work at her capacity.    Plan Continue lexapro 20 mg daily Decrease rexulti 1 mg daily for one week, then discontinue Continue Lunesta 2 mg at night as needed for sleep - two refills left Continue Xanax 0.25 mg twice a day as needed for anxiety  (used to be on 1 mg at night ) Referred to [facility] - will alert the coordinator again to schedule an appointment Next appointment: 3/21 at 8:30 for 30 mins, in person - Emergency resources which includes 911, ED, suicide crisis line [phone]) are discussed.      Past trials of medication: sertraline fluoxetine, lexapro, venlafaxine ("funny"), bupropion (dry mouth, nausea), quetiapine (drowsiness), Abilify (nausea, constipation, tinnitus), rexulti (unable to afford), Xanax, temazepam, Trazodone, Ambien, Belsomra (could not afford)   I have reviewed suicide assessment in  detail. Updated as below.  The patient demonstrates the following risk factors for suicide: Chronic risk factors for suicide include: psychiatric disorder of depression. Acute risk factors for suicide include: loss (financial, interpersonal, professional). Protective factors for this patient include: responsibility to others (children, family),  coping skills, hope for the future and religious beliefs against suicide. She is future oriented, and is amenable to treatment. Considering these factors, the overall suicide risk at this point appears to be moderate, but not at imminent risk. Patient is appropriate for outpatient follow up.        Collaboration of Care: Collaboration of Care: Other referred to [place]  Consent: Patient/Guardian gives verbal consent for treatment and assignment of benefits for services provided during this visit. Patient/Guardian expressed understanding and agreed to proceed.    Norman Clay, MD 01/08/2022, 3:09 PM

## 2022-01-08 ENCOUNTER — Telehealth (INDEPENDENT_AMBULATORY_CARE_PROVIDER_SITE_OTHER): Payer: 59 | Admitting: Psychiatry

## 2022-01-08 ENCOUNTER — Encounter: Payer: Self-pay | Admitting: Psychiatry

## 2022-01-08 ENCOUNTER — Other Ambulatory Visit: Payer: Self-pay

## 2022-01-08 DIAGNOSIS — G4700 Insomnia, unspecified: Secondary | ICD-10-CM

## 2022-01-08 DIAGNOSIS — F419 Anxiety disorder, unspecified: Secondary | ICD-10-CM | POA: Diagnosis not present

## 2022-01-08 DIAGNOSIS — F332 Major depressive disorder, recurrent severe without psychotic features: Secondary | ICD-10-CM

## 2022-01-08 MED ORDER — ALPRAZOLAM 0.25 MG PO TABS: 0.2500 mg | ORAL_TABLET | Freq: Two times a day (BID) | ORAL | 0 refills | Status: DC | PRN

## 2022-01-08 NOTE — Patient Instructions (Addendum)
Continue lexapro 20 mg daily Decrease rexulti 1 mg daily for one week, then discontinue Continue Lunesta 2 mg at night as needed for sleep  Continue Xanax 0.25 mg in AM twice a day as needed for anxiety Referred to [facility]  Next appointment: 3/21 at 8:30, in person  CONTACT INFORMATION What to do if you need to get in touch with someone regarding a psychiatric issue:  1. EMERGENCY: For psychiatric emergencies (if you are suicidal or if there are any other safety issues) call 911 and/or go to your nearest Emergency Room immediately.   2. IF YOU NEED SOMEONE TO TALK TO RIGHT NOW: Given my clinical responsibilities, I may not be able to speak with you over the phone for a prolonged period of time.  A. You may always call The National Suicide Prevention Lifeline at 1-800-273-TALK ([phone]).

## 2022-01-11 ENCOUNTER — Other Ambulatory Visit: Payer: Self-pay

## 2022-01-11 ENCOUNTER — Ambulatory Visit (INDEPENDENT_AMBULATORY_CARE_PROVIDER_SITE_OTHER): Payer: 59 | Admitting: Student in an Organized Health Care Education/Training Program

## 2022-01-11 DIAGNOSIS — F322 Major depressive disorder, single episode, severe without psychotic features: Secondary | ICD-10-CM | POA: Diagnosis not present
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
## 2022-01-12 ENCOUNTER — Other Ambulatory Visit: Payer: Self-pay | Admitting: Family Medicine

## 2022-01-21 ENCOUNTER — Encounter (HOSPITAL_COMMUNITY): Payer: 59

## 2022-01-22 ENCOUNTER — Institutional Professional Consult (permissible substitution): Payer: Self-pay | Admitting: Neurology

## 2022-01-22 ENCOUNTER — Encounter: Payer: Self-pay | Admitting: Neurology

## 2022-01-23 ENCOUNTER — Telehealth (INDEPENDENT_AMBULATORY_CARE_PROVIDER_SITE_OTHER): Payer: 59 | Admitting: Licensed Clinical Social Worker

## 2022-01-23 DIAGNOSIS — F322 Major depressive disorder, single episode, severe without psychotic features: Secondary | ICD-10-CM

## 2022-01-23 NOTE — Telephone Encounter (Signed)
Left message encouraging call back ?

## 2022-01-31 NOTE — Progress Notes (Signed)
Virtual Visit via Video Note ? ?I connected with Olivia Woodward on 02/05/22 at  8:30 AM EDT by a video enabled telemedicine application and verified that I am speaking with the correct person using two identifiers. ? ?Location: ?Patient: outside ?Provider: office ?Persons participated in the visit- patient, provider  ?  ?I discussed the limitations of evaluation and management by telemedicine and the availability of in person appointments. The patient expressed understanding and agreed to proceed. ? ? ?  ?I discussed the assessment and treatment plan with the patient. The patient was provided an opportunity to ask questions and all were answered. The patient agreed with the plan and demonstrated an understanding of the instructions. ?  ?The patient was advised to call back or seek an in-person evaluation if the symptoms worsen or if the condition fails to improve as anticipated. ? ?I provided 20 minutes of non-face-to-face time during this encounter. ? ? ?Norman Clay, MD ? ? ? ?BH MD/PA/NP OP Progress Note ? ?02/05/2022 9:09 AM ?Olivia Woodward  ?MRN:  [id number] ? ?Chief Complaint:  ?Chief Complaint  ?Patient presents with  ? Depression  ? Follow-up  ? ?HPI:  ?This is a follow-up appointment for depression and insomnia.  Although this appointment was originally scheduled for in person visit, it was changed to virtual visit based on the patient request. She was initially at her daughter's appointment.  She agrees to continue the interview after she went to her room where she can ensure her privacy. (Later in the interview, she apologized she made this appointment in the same time while it was not her intention.  She states that she got mixed up.) ? ?She states that nothing has changed.  She will return to work right after the previous visit as her long-term disability was not approved.  There was no choice for her to return to work.  She states that this is too much.  He feels terrible, not retaining information,  stating that they now have a new system.  She does not know what to do.  She does customer service.  She reports "okay "communication with her husband and providers, stating that she has to keep the job.  She tends to stay in the bed when she does not work. She has initial and middle insomnia.  She was witnessed to be snoring.  She was not aware that she missed the appointment for sleep evaluation on March 7th.  She agreed to contact the office for the appointment.  When she was later reminded again to contact the office to make an appointment, she states that "okay I have appointment (on March 7th.).  When she was informed that she missed that appointment, she asks what the month is today, although she was later able to recall it is March. She was able to verbalize her understanding that she missed the appointment after a repeated explanation.    ? ?She feels depressed.  She has no energy.  She has anhedonia.  She denies change in appetite.  She denies SI.   She continues to feel anxious and has panic attacks a few times per week.  She states that she cannot explain her symptoms well, although she is trying.  She takes Xanax twice a day for anxiety.  She denies hallucinations or paranoia.  She denies alcohol use or drug use. ? ?Visit Diagnosis:  ?  ICD-10-CM   ?1. Insomnia, unspecified type  G47.00   ?  ?2. Severe episode of recurrent  major depressive disorder, without psychotic features ([place])  F33.2 ALPRAZolam (XANAX) 0.25 MG tablet  ?  ? ? ?Past Psychiatric History: Please see initial evaluation for full details. I have reviewed the history. No updates at this time.  ?  ? ?Past Medical History:  ?Past Medical History:  ?Diagnosis Date  ? Anemia   ? Anxiety   ? Phreesia 03/18/2021  ? Depression   ? Phreesia 03/18/2021  ? Generalized headaches   ? Hypertension   ?  ?Past Surgical History:  ?Procedure Laterality Date  ? ABDOMINAL HYSTERECTOMY    ? fibroids, partial  ? BREAST REDUCTION SURGERY  2005  ? COLONOSCOPY  WITH PROPOFOL N/A 05/10/2019  ? Two 5 to 6 mm polyps in the sigmoid colon and in the ascending colon, removed with a  ? GASTRIC BYPASS  2007  ? POLYPECTOMY  05/10/2019  ? Procedure: POLYPECTOMY;  Surgeon: Daneil Dolin, MD;  Location[facility];  Service: Endoscopy;;  colon  ? ? ?Family Psychiatric History: Please see initial evaluation for full details. I have reviewed the history. No updates at this time.  ?  ? ?Family History:  ?Family History  ?Problem Relation Age of Onset  ? Hypertension Mother   ? Diabetes Mother   ? Drug abuse Mother   ? Hypertension Father   ? Hypertension Sister   ? Hypertension Sister   ? Colon cancer Neg Hx   ? ? ?Social History:  ?Social History  ? ?Socioeconomic History  ? Marital status: Single  ?  Spouse name: Not on file  ? Number of children: 2  ? Years of education: 62  ? Highest education level: Not on file  ?Occupational History  ? Not on file  ?Tobacco Use  ? Smoking status: Never  ? Smokeless tobacco: Never  ?Vaping Use  ? Vaping Use: Never used  ?Substance and Sexual Activity  ? Alcohol use: No  ? Drug use: No  ? Sexual activity: Yes  ?  Birth control/protection: Condom  ?Other Topics Concern  ? Not on file  ?Social History Narrative  ? Right handed  ? Caffeine use: tea/soda twice weekly  ? ?Social Determinants of Health  ? ?Financial Resource Strain: Not on file  ?Food Insecurity: Not on file  ?Transportation Needs: Not on file  ?Physical Activity: Not on file  ?Stress: Not on file  ?Social Connections: Not on file  ? ? ?Allergies:  ?Allergies  ?Allergen Reactions  ? Ketorolac Tromethamine Hives  ?  Lips swelled  ? ? ?Metabolic Disorder Labs: ?Lab Results  ?Component Value Date  ? HGBA1C 5.4 03/22/2021  ? ?No results found for: PROLACTIN ?Lab Results  ?Component Value Date  ? CHOL 164 03/22/2021  ? TRIG 73 03/22/2021  ? HDL 69 03/22/2021  ? CHOLHDL 2.4 03/22/2021  ? VLDL 10 01/02/2017  ? [place] 81 03/22/2021  ? LDLCALC 124 (H) 09/06/2020  ? ?Lab Results  ?Component  Value Date  ? TSH 2.542 10/04/2021  ? TSH 0.820 08/02/2021  ? ? ?Therapeutic Level Labs: ?No results found for: LITHIUM ?No results found for: VALPROATE ?No components found for:  CBMZ ? ?Current Medications: ?Current Outpatient Medications  ?Medication Sig Dispense Refill  ? AIMOVIG 140 MG/ML SOAJ SMARTSIG:140 Milligram(s) SUB-Q Once a Month    ? [START ON 02/09/2022] ALPRAZolam (XANAX) 0.25 MG tablet Take 1 tablet (0.25 mg total) by mouth 2 (two) times daily as needed for anxiety. 60 tablet 1  ? amLODipine (NORVASC) 10 MG tablet TAKE  1 TABLET BY MOUTH ONCE DAILY DOSE  INCREASE  EFFECTIVE  09/11/2020 90 tablet 0  ? Cyanocobalamin (B-12 PO) Take 2,500 mcg by mouth daily.    ? [START ON 02/25/2022] escitalopram (LEXAPRO) 20 MG tablet Take 1 tablet (20 mg total) by mouth daily. 30 tablet 2  ? eszopiclone 3 MG TABS Take 1 tablet (3 mg total) by mouth at bedtime as needed for sleep. Take immediately before bedtime 30 tablet 1  ? linaclotide (LINZESS) 290 MCG CAPS capsule Take 1 capsule (290 mcg total) by mouth daily before breakfast. 90 capsule 3  ? lubiprostone (AMITIZA) 24 MCG capsule Take 1 capsule (24 mcg total) by mouth 2 (two) times daily with a meal. 60 capsule 3  ? ondansetron (ZOFRAN) 4 MG tablet Take one tablet by mouth every 12 hours as needed, for  nausea 30 tablet 0  ? rizatriptan (MAXALT) 10 MG tablet Take 1 tablet (10 mg total) by mouth as needed for migraine. May repeat in 2 hours if needed 10 tablet 0  ? spironolactone (ALDACTONE) 100 MG tablet Take 1 tablet by mouth once daily 30 tablet 0  ? topiramate (TOPAMAX) 100 MG tablet Take 1 tablet (100 mg total) by mouth 2 (two) times daily. 60 tablet 3  ? traMADol (ULTRAM) 50 MG tablet tramadol 50 mg tablet ? TAKE 1 TO 2 TABLETS BY MOUTH EVERY 6 HOURS AS NEEDED FOR PAIN    ? Vitamin D, Ergocalciferol, (DRISDOL) 1.25 MG (50000 UNIT) CAPS capsule Take 1 capsule by mouth once a week 12 capsule 0  ? ?No current facility-administered medications for this visit.   ? ? ? ?Musculoskeletal: ?Strength & Muscle Tone:  N/A ?Gait & Station:  N/A ?Patient leans: N/A ? ?Psychiatric Specialty Exam: ?Review of Systems  ?Psychiatric/Behavioral:  Positive for decreased concentration

## 2022-02-05 ENCOUNTER — Telehealth (INDEPENDENT_AMBULATORY_CARE_PROVIDER_SITE_OTHER): Payer: 59 | Admitting: Psychiatry

## 2022-02-05 ENCOUNTER — Other Ambulatory Visit: Payer: Self-pay

## 2022-02-05 ENCOUNTER — Encounter: Payer: Self-pay | Admitting: Psychiatry

## 2022-02-05 DIAGNOSIS — G4700 Insomnia, unspecified: Secondary | ICD-10-CM

## 2022-02-05 DIAGNOSIS — F332 Major depressive disorder, recurrent severe without psychotic features: Secondary | ICD-10-CM

## 2022-02-05 MED ORDER — ALPRAZOLAM 0.25 MG PO TABS: 0.2500 mg | ORAL_TABLET | Freq: Two times a day (BID) | ORAL | 1 refills | Status: DC | PRN

## 2022-02-05 MED ORDER — ESZOPICLONE 3 MG PO TABS: 3.0000 mg | ORAL_TABLET | Freq: Every evening | ORAL | 1 refills | Status: DC | PRN

## 2022-02-05 MED ORDER — ESCITALOPRAM OXALATE 20 MG PO TABS: 20.0000 mg | ORAL_TABLET | Freq: Every day | ORAL | 2 refills | Status: DC

## 2022-02-05 NOTE — Patient Instructions (Signed)
Continue lexapro 20 mg daily ?Increase Lunesta 3 mg nightly as needed for insomnia ?Continue Xanax 0.25 mg twice a day as needed for anxiety  ?Referred to [facility]  ?Next appointment: 5/16 at 3 PM ? ?The next visit will be in person visit. Please arrive 15 mins before the scheduled time.  ? ?Olivia Woodward  ?Address: [address], [place], [place] [address]   ?

## 2022-02-07 ENCOUNTER — Encounter (HOSPITAL_COMMUNITY): Payer: Self-pay | Admitting: Hematology

## 2022-02-07 ENCOUNTER — Ambulatory Visit (INDEPENDENT_AMBULATORY_CARE_PROVIDER_SITE_OTHER): Payer: Managed Care, Other (non HMO) | Admitting: Family Medicine

## 2022-02-07 ENCOUNTER — Other Ambulatory Visit: Payer: Self-pay

## 2022-02-07 ENCOUNTER — Encounter: Payer: Self-pay | Admitting: Family Medicine

## 2022-02-07 ENCOUNTER — Telehealth (HOSPITAL_COMMUNITY): Payer: Self-pay

## 2022-02-07 VITALS — BP 125/83 | HR 85 | Ht 66.0 in | Wt 247.1 lb

## 2022-02-07 DIAGNOSIS — R413 Other amnesia: Secondary | ICD-10-CM | POA: Diagnosis not present

## 2022-02-07 DIAGNOSIS — G43009 Migraine without aura, not intractable, without status migrainosus: Secondary | ICD-10-CM | POA: Diagnosis not present

## 2022-02-07 DIAGNOSIS — M5442 Lumbago with sciatica, left side: Secondary | ICD-10-CM | POA: Diagnosis not present

## 2022-02-07 DIAGNOSIS — G8929 Other chronic pain: Secondary | ICD-10-CM

## 2022-02-07 DIAGNOSIS — F322 Major depressive disorder, single episode, severe without psychotic features: Secondary | ICD-10-CM

## 2022-02-07 DIAGNOSIS — F3341 Major depressive disorder, recurrent, in partial remission: Secondary | ICD-10-CM

## 2022-02-07 MED ORDER — TOPIRAMATE 100 MG PO TABS: 100.0000 mg | ORAL_TABLET | Freq: Two times a day (BID) | ORAL | 5 refills | Status: DC

## 2022-02-07 MED ORDER — WEGOVY 0.25 MG/0.5ML ~~LOC~~ SOAJ: 0.2500 mg | SUBCUTANEOUS | 1 refills | Status: DC

## 2022-02-07 MED ORDER — RIZATRIPTAN BENZOATE 10 MG PO TBDP: 10.0000 mg | ORAL_TABLET | ORAL | 0 refills | Status: DC | PRN

## 2022-02-07 NOTE — Progress Notes (Signed)
? ?  Olivia Woodward     MRN: [id number]      DOB: 18-Aug-1966 ? ? ?HPI ?Ms. Christopherson is here for follow up and re-evaluation of chronic medical conditions, medication management and review of any available recent lab and radiology data.  ?Preventive health is updated, specifically  Cancer screening and Immunization.   ?Questions or concerns regarding consultations or procedures which the PT has had in the interim are  addressed. ?The PT denies any adverse reactions to current medications since the last visit.  ?Still very depressed, treated by Psychiatry , but needs a therapist, not suicidal or homicidal ?2 month h/o headaches, different parts of the head , 2 to 3 times per week pounding, vision disturbance  as aura, nausea,disabling ?Fell 5 days ago while getting out of chair on left side , chair is low with no arm rests just hurting ?Intermittent right sharp pain from buttock to knee, right ?ROS ?Denies recent fever or chills. ?Denies sinus pressure, nasal congestion, ear pain or sore throat. ?Denies chest congestion, productive cough or wheezing. ?Denies chest pains, palpitations and leg swelling ?Denies abdominal pain, nausea, vomiting,diarrhea or constipation.   ?Denies dysuria, frequency, hesitancy or incontinence. ?Denies headaches, seizures, numbness, or tingling. ?Denies skin break down or rash. ? ? ?PE ? ?BP 125/83   Pulse 85   Ht '5\' 6"'$  (1.676 m)   Wt 247 lb 1.9 oz (112.1 kg)   SpO2 95%   BMI 39.89 kg/m?  ? ?Patient alert and oriented and in no cardiopulmonary distress.Flat appearance ? ?HEENT: No facial asymmetry, EOMI,     Neck supple . ? ?Chest: Clear to auscultation bilaterally. ? ?CVS: S1, S2 no murmurs, no S3.Regular rate. ? ?ABD: Soft non tender.  ? ?Ext: No edema ? ?MS: Adequate  though reduce dROM  lumbar spine, shoulders, hips and knees. ? ?Skin: Intact, no ulcerations or rash noted. ? ?Psych: Good eye contact, normal affect. Memory intact  ?CNS: CN 2-12 intact, power,  normal throughout.no  focal deficits noted. ? ? ?Assessment & Plan ? ?MDD (major depressive disorder), recurrent, in partial remission ([place]) ?PHQ 9 score of 16 in 01/2022, being treated by Psychiatry , refer to Therapist, not suicidal or homicidal ? ?Migraine headache ?Uncontrolled headaches which are disabling, refer Neurology re commit to daily topmax and maxalt as needed, scripts sent in ? ?Memory loss ?Continues to report memory loss, refer Neurology ? ?Chronic midline low back pain ?Increased pain and radiating to left leg  Needs x ray lumbar spine ? ?Morbid obesity ([place]) ? ?Patient re-educated about  the importance of commitment to a  minimum of 150 minutes of exercise per week as able. ? ?The importance of healthy food choices with portion control discussed, as well as eating regularly and within a 12 hour window most days. ?The need to choose "clean , green" food 50 to 75% of the time is discussed, as well as to make water the primary drink and set a goal of 64 ounces water daily. ? ?  ? ?  02/07/2022  ?  8:05 AM 12/18/2021  ? 10:07 AM 11/27/2021  ? 10:11 AM  ?Weight /BMI  ?Weight 247 lb 1.9 oz  242 lb  ?Height '5\' 6"'$  (1.676 m)  '5\' 6"'$  (1.676 m)  ?BMI 39.89 kg/m2  39.06 kg/m2  ?  ? Information is confidential and restricted. Go to Review Flowsheets to unlock data.  ? ? ?Start wegovy weekly ? ? ?

## 2022-02-07 NOTE — Patient Instructions (Addendum)
F/u in 9 weeks, call if you need me sooner ? ?X ray of low back today ? ?New for headaches is DAILY topamax , take 2 times eVERY day to reduce headache frequency, and maxalt for migraine headache ? ?You will be referred for neurologic evaluation in [place]. ? ?I have reached out re new therapist, if no response by tomorrow, will look elsewhere ? ?Thanks for choosing [facility], we consider it a privelige to serve you. ?It is important that you exercise regularly at least 30 minutes 5 times a week. If you develop chest pain, have severe difficulty breathing, or feel very tired, stop exercising immediately and seek medical attention  ?Think about what you will eat, plan ahead. ?Choose " clean, green, fresh or frozen" over canned, processed or packaged foods which are more sugary, salty and fatty. ?70 to 75% of food eaten should be vegetables and fruit. ?Three meals at set times with snacks allowed between meals, but they must be fruit or vegetables. ?Aim to eat over a 12 hour period , example 7 am to 7 pm, and STOP after  your last meal of the day. ?Drink water,generally about 64 ounces per day, no other drink is as healthy. Fruit juice is best enjoyed in a healthy way, by EATING the fruit. ? ? ?

## 2022-02-07 NOTE — Telephone Encounter (Signed)
Called to remind patient of [place] appointment tomorrow at 8:00 am. ?

## 2022-02-08 ENCOUNTER — Ambulatory Visit (HOSPITAL_BASED_OUTPATIENT_CLINIC_OR_DEPARTMENT_OTHER): Payer: 59 | Admitting: Psychiatry

## 2022-02-08 DIAGNOSIS — F332 Major depressive disorder, recurrent severe without psychotic features: Secondary | ICD-10-CM | POA: Diagnosis not present

## 2022-02-08 NOTE — Progress Notes (Signed)
Pt reported to [facility] for cortical mapping and motor threshold determination for Repetitive Transcranial Magnetic Stimulation treatment for Major Depressive Disorder. Prior to procedure, pt signed an informed consent agreement for [place] treatment. Pt's treatment area was found by applying single pulses to her left motor cortex, hunting along the anterior/posterior plane and along the superior oblique angle until the best motor response was elicited from the pt's right thumb. The best response was observed at 6.7 cm A/P and 34 degrees SOA, with a coil angle of 0 degrees. Pt's motor threshold was calculated using the [employer]'s proprietary MT Assist algorithm, which produced a calculated motor threshold of 1.28 SMT. Per these findings, pt's treatment parameters are as follows: A/P -- 12.6 cm, SOA -- 34 degrees, Coil Angle --  0 degrees, Motor Threshold -- 1.28 SMT. With these parameters, the pt will receive 36 sessions of TMS according to the following protocol: 3000 pulses per session, with stimulation in bursts of pulses lasting 4 seconds at a frequency of 10 Hz, separated by 11 seconds of rest. After determining pt's tx parameters, coil was moved to the treatment location, and the first burst of pulses was applied at a reduced power of 80% MT. Upon completion of mapping, pt completed a one intervals for observation of side effects.  ? ?Pt does not want to continue with treatment. Pt states that she does not like the tapping on her head. "It is not what I expected." [place] coordinator paused tx to give her a break and try again. After the second try pt said, "no I don't want to do this." ? ?

## 2022-02-11 ENCOUNTER — Encounter: Payer: Self-pay | Admitting: Family Medicine

## 2022-02-11 ENCOUNTER — Encounter (HOSPITAL_COMMUNITY): Payer: Self-pay | Admitting: Hematology

## 2022-02-11 DIAGNOSIS — M545 Low back pain, unspecified: Secondary | ICD-10-CM | POA: Insufficient documentation

## 2022-02-11 NOTE — Assessment & Plan Note (Signed)
Increased pain and radiating to left leg  Needs x ray lumbar spine ?

## 2022-02-11 NOTE — Assessment & Plan Note (Addendum)
Uncontrolled headaches which are disabling, refer Neurology re commit to daily topmax and maxalt as needed, scripts sent in ?

## 2022-02-11 NOTE — Assessment & Plan Note (Signed)
PHQ 9 score of 16 in 01/2022, being treated by Psychiatry , refer to Therapist, not suicidal or homicidal ?

## 2022-02-11 NOTE — Assessment & Plan Note (Signed)
Continues to report memory loss, refer Neurology ?

## 2022-02-11 NOTE — Assessment & Plan Note (Signed)
?  Patient re-educated about  the importance of commitment to a  minimum of 150 minutes of exercise per week as able. ? ?The importance of healthy food choices with portion control discussed, as well as eating regularly and within a 12 hour window most days. ?The need to choose "clean , green" food 50 to 75% of the time is discussed, as well as to make water the primary drink and set a goal of 64 ounces water daily. ? ?  ? ?  02/07/2022  ?  8:05 AM 12/18/2021  ? 10:07 AM 11/27/2021  ? 10:11 AM  ?Weight /BMI  ?Weight 247 lb 1.9 oz  242 lb  ?Height '5\' 6"'$  (1.676 m)  '5\' 6"'$  (1.676 m)  ?BMI 39.89 kg/m2  39.06 kg/m2  ?  ? Information is confidential and restricted. Go to Review Flowsheets to unlock data.  ? ? ?Start wegovy weekly ?

## 2022-02-18 ENCOUNTER — Telehealth: Payer: Self-pay | Admitting: Family Medicine

## 2022-02-18 NOTE — Telephone Encounter (Signed)
Pls call pot and find out if she has an appt with [name] for theray, Vickii Chafe confirmed she will see her , I see no appt scheduled am following up on this, if not I will reach out to P Bynum with a tele message ?

## 2022-02-18 NOTE — Telephone Encounter (Signed)
She does not have anything scheduled with [name]- never heard anything ?

## 2022-02-18 NOTE — Telephone Encounter (Signed)
Good Morning ! ?Hope all is well. ?Following up on the referral that you accepted on this pt. She has had no call, can you assist in getting her an appointment with you for therapy, or what should I do? ?Thanks ?Have a good week ?

## 2022-02-20 ENCOUNTER — Other Ambulatory Visit: Payer: Self-pay | Admitting: Family Medicine

## 2022-03-04 ENCOUNTER — Encounter (HOSPITAL_COMMUNITY): Payer: Self-pay | Admitting: Hematology

## 2022-03-14 ENCOUNTER — Encounter (HOSPITAL_COMMUNITY): Payer: Self-pay | Admitting: Hematology

## 2022-03-14 ENCOUNTER — Ambulatory Visit (HOSPITAL_COMMUNITY): Payer: 59 | Admitting: Psychiatry

## 2022-03-25 ENCOUNTER — Ambulatory Visit (INDEPENDENT_AMBULATORY_CARE_PROVIDER_SITE_OTHER): Payer: Managed Care, Other (non HMO) | Admitting: Neurology

## 2022-03-25 ENCOUNTER — Telehealth: Payer: Self-pay | Admitting: Neurology

## 2022-03-25 ENCOUNTER — Encounter (HOSPITAL_COMMUNITY): Payer: Self-pay | Admitting: Hematology

## 2022-03-25 ENCOUNTER — Encounter: Payer: Self-pay | Admitting: Neurology

## 2022-03-25 ENCOUNTER — Other Ambulatory Visit: Payer: Self-pay | Admitting: Neurology

## 2022-03-25 ENCOUNTER — Other Ambulatory Visit: Payer: Self-pay | Admitting: Family Medicine

## 2022-03-25 VITALS — BP 142/87 | HR 66 | Ht 66.0 in | Wt 244.0 lb

## 2022-03-25 DIAGNOSIS — F331 Major depressive disorder, recurrent, moderate: Secondary | ICD-10-CM | POA: Diagnosis not present

## 2022-03-25 DIAGNOSIS — Z85850 Personal history of malignant neoplasm of thyroid: Secondary | ICD-10-CM

## 2022-03-25 DIAGNOSIS — F99 Mental disorder, not otherwise specified: Secondary | ICD-10-CM

## 2022-03-25 DIAGNOSIS — F5105 Insomnia due to other mental disorder: Secondary | ICD-10-CM

## 2022-03-25 DIAGNOSIS — G9332 Myalgic encephalomyelitis/chronic fatigue syndrome: Secondary | ICD-10-CM

## 2022-03-25 DIAGNOSIS — G44019 Episodic cluster headache, not intractable: Secondary | ICD-10-CM

## 2022-03-25 DIAGNOSIS — G3184 Mild cognitive impairment, so stated: Secondary | ICD-10-CM | POA: Diagnosis not present

## 2022-03-25 MED ORDER — LAMOTRIGINE 25 MG PO TABS: ORAL_TABLET | ORAL | 0 refills | Status: DC

## 2022-03-25 MED ORDER — LAMOTRIGINE 25 MG PO TABS: ORAL_TABLET | ORAL | 1 refills | Status: DC

## 2022-03-25 NOTE — Telephone Encounter (Signed)
Review of Systems per primary psychiatrist: Dr Modesta Messing.  ?No new symptoms, no physical new symptoms. ? ?The patient's insomnia was deemed to be related to her mood disorder.  ?The patient's memory concerns were deemed related to her mood disorder. Chronic pain  not addressed with neurology, sleep clinic.  ? ?Psychiatric/Behavioral:  Positive for decreased concentration, dysphoric mood and sleep disturbance. Negative for agitation, behavioral problems, confusion, hallucinations, self-injury and suicidal ideas. The patient is nervous/anxious. The patient is not hyperactive.   ? ?All other systems reviewed and are negative. ?

## 2022-03-25 NOTE — Progress Notes (Addendum)
? ? ?SLEEP MEDICINE CLINIC ?  ? ?Provider:  Larey Seat, MD  ?Primary Care Physician:  Fayrene Helper, MD ?[address], [address] ?[place] [address]  ? ?  ?Referring Provider: this time Dr Moshe Cipro, MD  ? ? ? Norman Clay, MD ?  ?  ?    ?Chief Complaint according to patient   ?Patient presents with:  ?  ? New Patient (Initial Visit)  ?   Pt alone, rm 10. Pt states she has been having memory concerns that has been on going for > 54yr She was having some difficulty with stress in past and she was started on medications during that time. She feels that the memory loss started about the time she was on that medication. Pt alone, rm 10. Pt states she has been having memory concerns that has been on going for > 54yrShe was having some difficulty with stress in past and she was started on medications during that time. She feels that the memory loss started about the time she was on that medication. She has stopped that medication but memory loss still remains. It seems mostly as short term. She really has to concentrate and think about what she is doing when she goes into the kitchen or whatever it is she is doing.  ?  ?  ?HISTORY OF PRESENT ILLNESS:  ?Olivia NIXONs a [id number]. AAF patient seen here in a consultation on 03/25/2022  this time from PCP dr SiMoshe CiproMD and not her psychiatrist , Dr HiModesta Messing ?This is in regards to a new problem, memory loss.  The patient felt the memory loss was related to long term xanax intake. Her previous visit with me was for insomnia and no organic causes were identified, this patient has long history of severe depression, on medication. ?She reports names escape her, but she knows in which context these people stand to her ( through church, work, neighbors or her daughters friends ) a well as places that she should know and can't recognize.  ?She was driving  and got lost on the way to her [employer], stopped driving after that.  ?Trouble to remember appointments.  ? ?No  history of brain injury. No family history of DEMENTIA.  ? ?MRI brain from 12-16-2021 reviewed, this was a study cum/ sine contrast and shows a normal brain no atrophy.  ? ?No  prior memory testing with PCP or psychiatrist.  ?MOCA was started here, but patient had significant difficulties with the first 2 lines of testing- there was inability to copy a cube, place the hands of the clock correctly and she failed a trail making test. She couldn't nal me a rhino or camel. ? ?Was switched to MMSE: see results below. HS graduate, some college- business admin. ? ? ? ?10-23-2021:  ?Chief concern according to patient :   Internal referral for 10 years plus history of insomnia.  ?No prior sleep study. Fatigued when she wakes up, tired throughout the day. Woken by headaches, cluster type- has migraines, too-  Wakes up with headaches. No snoring.  ?  ?I have the pleasure of seeing Olivia Woodward a right -handed African American female with Insomnia, unclear if any organic sleep disorder is present.  She  has a poorly abstracted medical history of iron deficiency Anemia, GAD-Anxiety,  chronic Depression, Migraine and Cluster , and Hypertension. This patient has thyroid cancer and completed radiation therapy, this was not entered into her  Med Hx . Seasonal allergies.status post  gastric bypass- since 2010. ?   ?Sleep relevant medical history: The patient was diagnosed with thyroid cancer and has undergone radiation therapy.  She is due for a revisit with her oncologist in 6 months.  She is truly feels that the thyroid disease itself has also contributed to a level of fatigue that was not seen before.  She has struggled with depression and that certainly also manifests in poor sleep quality and less restorative sleep.  She is not aware if she snores.  Her daughter sleeps close to her and has never mentioned snoring.  She indicates to bathroom breaks at night, not excessive.  Her average hours of sleep at night are 4 to 5  hours. ?Her main problem is to initiate sleep and she reports being able to stay up at night until the early morning hours and then will sleep only 4 or 5 hours. Almost reversed circadian rhythm.  ? Family medical /sleep history: no other family member on CPAP with OSA, insomnia, sleep walkers.  ?  ?Social history:  Patient is working as Publishing copy. and lives in a household with daughter, son in college.  No pets.  ?Tobacco use- none.  ETOH use none ,  ?Caffeine intake in form of Coffee( ) Soda( 2-3 a week/ mt dew) Tea ( only when eating out) or energy drinks. ?Regular exercise in form of - no energy.   ?Hobbies :no energy. Excessive fatigue.  ? ?  ?  ?Sleep habits are as follows: The patient's dinner time is 5 PM. The patient goes to bed at 9 PM and continues to be awake until midnight or longer- then sleep for 4-5 hours, wakes for 2 bathroom breaks.   ?The preferred sleep position is sideways, with the support of 1 pillows. Elevated head of bed- 12-15 degrees. Dreams are reportedly rare.  ?7.30  AM is the usual rise time. The patient wakes up spontaneously much earlier. Work starts at 70 AM. Works from home.  ?She reports not feeling refreshed or restored in AM, with symptoms such as dry mouth, morning headaches, and residual fatigue. Naps are taken infrequently,can't sleep- ?  ?Review of Systems: ?Out of a complete 14 system review, the patient complains of only the following symptoms, and all other reviewed systems are negative.:  ?Fatigue, sleepiness , snoring, nocturia fragmented sleep, Insomnia - CHRONIC, sleep initiation-  ?  ?How likely are you to doze in the following situations: ?0 = not likely, 1 = slight chance, 2 = moderate chance, 3 = high chance ?  ?Sitting and Reading? ?Watching Television? ?Sitting inactive in a public place (theater or meeting)? ?As a passenger in a car for an hour without a break? ?Lying down in the afternoon when circumstances permit? ?Sitting and talking to  someone? ?Sitting quietly after lunch without alcohol? ?In a car, while stopped for a few minutes in traffic? ?  ?Total = 3/ 24 points  ? FSS endorsed at 45/ 63 points.  ? ?DEPRESSION< ANXIETY CHONIC FATIGUE< CLUSTER HEADACHES>  ? ?   ? View : No data to display.  ?  ?  ?  ?  ? ?  03/25/2022  ?  2:27 PM  ?MMSE - Mini Mental State Exam  ?Orientation to time 4  ?Orientation to Place 4  ?Registration 3  ?Attention/ Calculation 5  ?Recall 1  ?Language- name 2 objects 2  ?Language- repeat 1  ?Language- follow 3 step command 3  ?Language-  read & follow direction 1  ?Write a sentence 1  ?Copy design 0  ?Total score 25  ? ? ? ? ?Social History  ? ?Socioeconomic History  ? Marital status: Single- never married   ?  Spouse name: Not on file  ? Number of children: 2, son is 7, daughter is 54 years old, 2023.   ? Years of education: 36  ? Highest education level: Not on file  ?Occupational History  ? Not on file  ?Tobacco Use  ? Smoking status: Never  ? Smokeless tobacco: Never  ?Vaping Use  ? Vaping Use: Never used  ?Substance and Sexual Activity  ? Alcohol use: No  ? Drug use: No  ? Sexual activity: Yes  ?  Birth control/protection: Condom  ?Other Topics Concern  ? Not on file  ?Social History Narrative  ? Right handed  ? Caffeine use: tea/soda twice weekly  ? ?Social Determinants of Health  ? ?Financial Resource Strain: Not on file  ?Food Insecurity: Not on file  ?Transportation Needs: Not on file  ?Physical Activity: Not on file  ?Stress: Not on file  ?Social Connections: Not on file  ? ? ?Family History  ?Problem Relation Age of Onset  ? Hypertension Mother   ? Diabetes Mother   ? Drug abuse Mother   ? Hypertension Father   ? Hypertension Sister   ? Hypertension Sister   ? Colon cancer Neg Hx   ? ? ?Past Medical History:  ?Diagnosis Date  ? Anemia   ? Anxiety   ? Phreesia 03/18/2021  ? Depression, major - 2020   ? Phreesia 03/18/2021  ? Generalized headaches   ? Hypertension ? ?Obesity ? ?Insomnia/   ? ? ?Past Surgical  History:  ?Procedure Laterality Date  ? ABDOMINAL HYSTERECTOMY    ? fibroids, partial  ? BREAST REDUCTION SURGERY  2005  ? COLONOSCOPY WITH PROPOFOL N/A 05/10/2019  ? Two 5 to 6 mm polyps in the sigmoid colon and in the as

## 2022-03-25 NOTE — Patient Instructions (Addendum)
Lamictal titration for depression and headache prevention.  ? ?Lamotrigine Tablets ?What is this medication? ?LAMOTRIGINE (la MOE Hendricks Limes) prevents and controls seizures in people with epilepsy. It may also be used to treat bipolar disorder. It works by calming overactive nerves in your body. ?This medicine may be used for other purposes; ask your health care provider or pharmacist if you have questions. ?COMMON BRAND NAME(S): Lamictal, Subvenite ?What should I tell my care team before I take this medication? ?They need to know if you have any of these conditions: ?Heart disease ?History of irregular heartbeat ?Immune system problems ?Kidney disease ?Liver disease ?Low levels of folic acid in the blood ?Lupus ?Mental illness ?Suicidal thoughts, plans, or attempt; a previous suicide attempt by you or a family member ?An unusual or allergic reaction to lamotrigine or other seizure medications, other medications, foods, dyes, or preservatives ?Pregnant or trying to get pregnant ?Breast-feeding ?How should I use this medication? ?Take this medication by mouth with a glass of water. Follow the directions on the prescription label. Do not chew these tablets. If this medication upsets your stomach, take it with food or milk. Take your doses at regular intervals. Do not take your medication more often than directed. ?A special MedGuide will be given to you by the pharmacist with each new prescription and refill. Be sure to read this information carefully each time. ?Talk to your care team about the use of this medication in children. While this medication may be prescribed for children as young as 2 years for selected conditions, precautions do apply. ?Overdosage: If you think you have taken too much of this medicine contact a poison control center or emergency room at once. ?NOTE: This medicine is only for you. Do not share this medicine with others. ?What if I miss a dose? ?If you miss a dose, take it as soon as you can.  If it is almost time for your next dose, take only that dose. Do not take double or extra doses. ?What may interact with this medication? ?Atazanavir ?Birth control pills ?Certain medications for irregular heartbeat ?Certain medications for seizures like carbamazepine, phenobarbital, phenytoin, primidone, valproic acid ?Lopinavir ?Rifampin ?Ritonavir ?This list may not describe all possible interactions. Give your health care provider a list of all the medicines, herbs, non-prescription drugs, or dietary supplements you use. Also tell them if you smoke, drink alcohol, or use illegal drugs. Some items may interact with your medicine. ?What should I watch for while using this medication? ?Visit your care team for regular checks on your progress. If you take this medication for seizures, wear a Medic Alert bracelet or necklace. Carry an identification card with information about your condition, medications, and care team. ?It is important to take this medication exactly as directed. When first starting treatment, your dose will need to be adjusted slowly. It may take weeks or months before your dose is stable. You should contact your care team if your seizures get worse or if you have any new types of seizures. Do not stop taking this medication unless instructed by your care team. Stopping your medication suddenly can increase your seizures or their severity. ?This medication may cause serious skin reactions. They can happen weeks to months after starting the medication. Contact your care team right away if you notice fevers or flu-like symptoms with a rash. The rash may be red or purple and then turn into blisters or peeling of the skin. Or, you might notice a red rash with swelling  of the face, lips or lymph nodes in your neck or under your arms. ?You may get drowsy, dizzy, or have blurred vision. Do not drive, use machinery, or do anything that needs mental alertness until you know how this medication affects you.  To reduce dizzy or fainting spells, do not sit or stand up quickly, especially if you are an older patient. Alcohol can increase drowsiness and dizziness. Avoid alcoholic drinks. ?If you are taking this medication for bipolar disorder, it is important to report any changes in your mood to your care team. If your condition gets worse, you get mentally depressed, feel very hyperactive or manic, have difficulty sleeping, or have thoughts of hurting yourself or committing suicide, you need to get help from your care team right away. If you are a caregiver for someone taking this medication for bipolar disorder, you should also report these behavioral changes right away. The use of this medication may increase the chance of suicidal thoughts or actions. Pay special attention to how you are responding while on this medication. ?Your mouth may get dry. Chewing sugarless gum or sucking hard candy, and drinking plenty of water may help. Contact your care team if the problem does not go away or is severe. ?Women who become pregnant while using this medication may enroll in the [place] Pregnancy Registry by calling [phone]. This registry collects information about the safety of antiepileptic medication use during pregnancy. ?This medication may cause a decrease in folic acid. You should make sure that you get enough folic acid while you are taking this medication. Discuss the foods you eat and the vitamins you take with your care team. ?What side effects may I notice from receiving this medication? ?Side effects that you should report to your care team as soon as possible: ?Allergic reactions--skin rash, itching, hives, swelling of the face, lips, tongue, or throat ?Change in vision ?Fever, neck pain or stiffness, sensitivity to light, headache, nausea, vomiting, confusion ?Heart rhythm changes--fast or irregular heartbeat, dizziness, feeling faint or lightheaded, chest pain, trouble  breathing ?Infection--fever, chills, cough, or sore throat ?Liver injury--right upper belly pain, loss of appetite, nausea, light-colored stool, dark yellow or brown urine, yellowing skin or eyes, unusual weakness or fatigue ?Low red blood cell count--unusual weakness or fatigue, dizziness, headache, trouble breathing ?Rash, fever, and swollen lymph nodes ?Redness, blistering, peeling or loosening of the skin, including inside the mouth ?Thoughts of suicide or self-harm, worsening mood, or feelings of depression ?Unusual bruising or bleeding ?Side effects that usually do not require medical attention (report to your care team if they continue or are bothersome): ?Diarrhea ?Dizziness ?Drowsiness ?Headache ?Nausea ?Stomach pain ?Tremors or shaking ?This list may not describe all possible side effects. Call your doctor for medical advice about side effects. You may report side effects to FDA at 1-800-FDA-1088. ?Where should I keep my medication? ?Keep out of the reach of children and pets. ?Store at 25 degrees C (77 degrees F). Protect from light. Get rid of any unused medication after the expiration date. ?To get rid of medications that are no longer needed or have expired: ?Take the medication to a medication take-back program. Check with your pharmacy or law enforcement to find a location. ?If you cannot return the medication, check the label or package insert to see if the medication should be thrown out in the garbage or flushed down the toilet. If you are not sure, ask your care team. If it is safe to put it  in the trash, empty the medication out of the container. Mix the medication with cat litter, dirt, coffee grounds, or other unwanted substance. Seal the mixture in a bag or container. Put it in the trash. ?NOTE: This sheet is a summary. It may not cover all possible information. If you have questions about this medicine, talk to your doctor, pharmacist, or health care provider. ?? 2023 Elsevier/Gold Standard  (2021-10-05 00:00:00) ? ? ?I prescribed 120 tabs of 25 mg for month 1.  ? ?Stop TOPIRAMATE and start Lamictal.  ? ?Headache and depression prevention.  ? ?Week 1 25 mg bid po ?Week 2 25 mg in AM and 50 mg in P

## 2022-03-31 NOTE — Progress Notes (Signed)
Virtual Visit via Video Note ? ?I connected with Olivia Woodward on 04/02/22 at  3:00 PM EDT by a video enabled telemedicine application and verified that I am speaking with the correct person using two identifiers. ? ?Location: ?Patient: work ?Provider: office ?Persons participated in the visit- patient, provider   ? ?I discussed the limitations of evaluation and management by telemedicine and the availability of in person appointments. The patient expressed understanding and agreed to proceed. ?  ?I discussed the assessment and treatment plan with the patient. The patient was provided an opportunity to ask questions and all were answered. The patient agreed with the plan and demonstrated an understanding of the instructions. ?  ?The patient was advised to call back or seek an in-person evaluation if the symptoms worsen or if the condition fails to improve as anticipated. ? ?I provided 18 minutes of non-face-to-face time during this encounter. ? ? ?Norman Clay, MD ? ? ? ?BH MD/PA/NP OP Progress Note ? ?04/02/2022 4:34 PM ?[place]  ?MRN:  [id number] ? ?Chief Complaint:  ?Chief Complaint  ?Patient presents with  ? Follow-up  ? Depression  ? ?HPI:  ?- According to the chart review, she declined to continue [place].  ?This is a follow-up appointment for depression and insomnia.  This appointment was switched from in person to virtual due to patient request.  ?She states that she has been doing okay.  She states that she is working now. When she was asked when she returned to work, she states she was already working at the last visit.  When she was informed that this writer was not aware that she was back to work at that time, she states that "I told you that."  She ruminates on this several times during the visit. She states that she feels frustrated that this Probation officer does not recall what she said. She states that the similar things happens with others including her daughter.  Although she remembers that she said  certain things, other says that they have not heard about it.  She states that she feels "something is wrong with me." She states that "everybody is against it," and "people think I am crazy or have dementia." She was informed that there was a miscommunication earlier that this writer said that she did not say she was back to work at that time (per chart review, she has not returned to work yet, although she talked about returning back to work soon at the last visit), although this Probation officer remembers that she said she had to go back to work.  However, she continues to report her frustration, and states that she cannot understand why this Probation officer cannot imagine she had to go back to work due to the situation (with her disability being denied).  She feels scared to do [place] as she felt there was banging in her head.  She would like to try other medication first.  She sleeps better with the Lunesta.  She denies change in appetite or weight.  She feels depressed and anxious. She takes xanax at times for anxiety. She denies SI. She denies AH, VH.  ? ? ?Visit Diagnosis:  ?  ICD-10-CM   ?1. MDD (major depressive disorder), recurrent episode, moderate (HCC)  F33.1 ALPRAZolam (XANAX) 0.25 MG tablet  ?  ?2. Insomnia, unspecified type  G47.00   ?  ? ? ?Past Psychiatric History: Please see initial evaluation for full details. I have reviewed the history. No updates at this time.  ?  ? ?  Past Medical History:  ?Past Medical History:  ?Diagnosis Date  ? Anemia   ? Anxiety   ? Phreesia 03/18/2021  ? Depression   ? Phreesia 03/18/2021  ? Generalized headaches   ? Hypertension   ?  ?Past Surgical History:  ?Procedure Laterality Date  ? ABDOMINAL HYSTERECTOMY    ? fibroids, partial  ? BREAST REDUCTION SURGERY  2005  ? COLONOSCOPY WITH PROPOFOL N/A 05/10/2019  ? Two 5 to 6 mm polyps in the sigmoid colon and in the ascending colon, removed with a  ? GASTRIC BYPASS  2007  ? POLYPECTOMY  05/10/2019  ? Procedure: POLYPECTOMY;  Surgeon: Daneil Dolin, MD;  [facility];  Service: Endoscopy;;  colon  ? ? ?Family Psychiatric History: Please see initial evaluation for full details. I have reviewed the history. No updates at this time.  ?  ? ?Family History:  ?Family History  ?Problem Relation Age of Onset  ? Hypertension Mother   ? Diabetes Mother   ? Drug abuse Mother   ? Hypertension Father   ? Hypertension Sister   ? Hypertension Sister   ? Colon cancer Neg Hx   ? ? ?Social History:  ?Social History  ? ?Socioeconomic History  ? Marital status: Single  ?  Spouse name: Not on file  ? Number of children: 2  ? Years of education: 80  ? Highest education level: Not on file  ?Occupational History  ? Not on file  ?Tobacco Use  ? Smoking status: Never  ? Smokeless tobacco: Never  ?Vaping Use  ? Vaping Use: Never used  ?Substance and Sexual Activity  ? Alcohol use: No  ? Drug use: No  ? Sexual activity: Yes  ?  Birth control/protection: Condom  ?Other Topics Concern  ? Not on file  ?Social History Narrative  ? Right handed  ? Caffeine use: tea/soda twice weekly  ? ?Social Determinants of Health  ? ?Financial Resource Strain: Not on file  ?Food Insecurity: Not on file  ?Transportation Needs: Not on file  ?Physical Activity: Not on file  ?Stress: Not on file  ?Social Connections: Not on file  ? ? ?Allergies:  ?Allergies  ?Allergen Reactions  ? Ketorolac Tromethamine Hives  ?  Lips swelled  ? ? ?Metabolic Disorder Labs: ?Lab Results  ?Component Value Date  ? HGBA1C 5.4 03/22/2021  ? ?No results found for: PROLACTIN ?Lab Results  ?Component Value Date  ? CHOL 164 03/22/2021  ? TRIG 73 03/22/2021  ? HDL 69 03/22/2021  ? CHOLHDL 2.4 03/22/2021  ? VLDL 10 01/02/2017  ? [place] 81 03/22/2021  ? LDLCALC 124 (H) 09/06/2020  ? ?Lab Results  ?Component Value Date  ? TSH 2.542 10/04/2021  ? TSH 0.820 08/02/2021  ? ? ?Therapeutic Level Labs: ?No results found for: LITHIUM ?No results found for: VALPROATE ?No components found for:  CBMZ ? ?Current  Medications: ?Current Outpatient Medications  ?Medication Sig Dispense Refill  ? cariprazine (VRAYLAR) 1.5 MG capsule Take 1 capsule (1.5 mg total) by mouth daily. 30 capsule 1  ? [START ON 04/25/2022] ALPRAZolam (XANAX) 0.25 MG tablet Take 1 tablet (0.25 mg total) by mouth 2 (two) times daily as needed for anxiety. 60 tablet 1  ? amLODipine (NORVASC) 10 MG tablet Take 1 tablet by mouth once daily 90 tablet 0  ? Cyanocobalamin (B-12 PO) Take 2,500 mcg by mouth daily.    ? escitalopram (LEXAPRO) 20 MG tablet Take 1 tablet (20 mg total) by mouth daily.  30 tablet 2  ? [START ON 04/13/2022] Eszopiclone 3 MG TABS Take 1 tablet (3 mg total) by mouth at bedtime as needed. Take immediately before bedtime 30 tablet 1  ? lamoTRIgine (LAMICTAL) 25 MG tablet Week 1 25 mg bid po, Week 2- 25 mg in AM and 50 mg in PM, Week 3- 50 mg bid po, Week 4- 75 mg bid po, Week 5 100 mg bid po 161 tablet 0  ? ondansetron (ZOFRAN) 4 MG tablet Take one tablet by mouth every 12 hours as needed, for  nausea 30 tablet 0  ? rizatriptan (MAXALT-MLT) 10 MG disintegrating tablet Take 1 tablet (10 mg total) by mouth as needed for migraine. May repeat in 2 hours if needed 10 tablet 0  ? spironolactone (ALDACTONE) 100 MG tablet Take 1 tablet by mouth once daily 30 tablet 0  ? Vitamin D, Ergocalciferol, (DRISDOL) 1.25 MG (50000 UNIT) CAPS capsule Take 1 capsule by mouth once a week 12 capsule 0  ? ?No current facility-administered medications for this visit.  ? ? ? ?Musculoskeletal: ?Strength & Muscle Tone: within normal limits ?Gait & Station: normal ?Patient leans: N/A ? ?Psychiatric Specialty Exam: ?Review of Systems  ?Psychiatric/Behavioral:  Positive for decreased concentration and dysphoric mood. Negative for agitation, behavioral problems, confusion, hallucinations, self-injury, sleep disturbance and suicidal ideas. The patient is nervous/anxious. The patient is not hyperactive.   ?All other systems reviewed and are negative.  ?There were no vitals  taken for this visit.There is no height or weight on file to calculate BMI.  ?General Appearance: Fairly Groomed  ?Eye Contact:  Good  ?Speech:  Clear and Coherent  ?Volume:  Normal  ?Mood:   frustrated  ?Affect:  Appropriat

## 2022-04-02 ENCOUNTER — Telehealth (INDEPENDENT_AMBULATORY_CARE_PROVIDER_SITE_OTHER): Payer: 59 | Admitting: Psychiatry

## 2022-04-02 ENCOUNTER — Encounter: Payer: Self-pay | Admitting: Psychiatry

## 2022-04-02 DIAGNOSIS — F331 Major depressive disorder, recurrent, moderate: Secondary | ICD-10-CM | POA: Diagnosis not present

## 2022-04-02 DIAGNOSIS — G4700 Insomnia, unspecified: Secondary | ICD-10-CM | POA: Diagnosis not present

## 2022-04-02 MED ORDER — ALPRAZOLAM 0.25 MG PO TABS: 0.2500 mg | ORAL_TABLET | Freq: Two times a day (BID) | ORAL | 1 refills | Status: DC | PRN

## 2022-04-02 MED ORDER — CARIPRAZINE HCL 1.5 MG PO CAPS: 1.5000 mg | ORAL_CAPSULE | Freq: Every day | ORAL | 1 refills | Status: DC

## 2022-04-02 MED ORDER — ESZOPICLONE 3 MG PO TABS: 3.0000 mg | ORAL_TABLET | Freq: Every evening | ORAL | 1 refills | Status: DC | PRN

## 2022-04-02 NOTE — Patient Instructions (Signed)
Continue lexapro 20 mg daily ?Start Vraylar 1.5 mg daily  ?Continue Lunesta 3 mg nightly as needed for insomnia ?Continue Xanax 0.25 mg twice a day as needed for anxiety  ?Next appointment: 6/28 at 1:30  ?

## 2022-04-04 ENCOUNTER — Encounter: Payer: Self-pay | Admitting: "Endocrinology

## 2022-04-04 ENCOUNTER — Ambulatory Visit (INDEPENDENT_AMBULATORY_CARE_PROVIDER_SITE_OTHER): Payer: Managed Care, Other (non HMO) | Admitting: "Endocrinology

## 2022-04-04 VITALS — BP 126/82 | HR 84 | Ht 66.0 in | Wt 248.6 lb

## 2022-04-04 DIAGNOSIS — E0520 Thyrotoxicosis with toxic multinodular goiter without thyrotoxic crisis or storm: Secondary | ICD-10-CM

## 2022-04-04 LAB — T4, FREE: Free T4: 1.05 ng/dL (ref 0.82–1.77)

## 2022-04-04 LAB — T3, FREE: T3, Free: 2.7 pg/mL (ref 2.0–4.4)

## 2022-04-04 LAB — TSH: TSH: 2.93 u[IU]/mL (ref 0.450–4.500)

## 2022-04-04 NOTE — Progress Notes (Signed)
04/04/2022, 2:49 PM  Endocrinology follow-up note   Subjective:    Patient ID: Olivia Woodward, female    DOB: 1966/03/06, PCP Fayrene Helper, MD   Past Medical History:  Diagnosis Date   Anemia    Anxiety    Phreesia 03/18/2021   Depression    Phreesia 03/18/2021   Generalized headaches    Hypertension    Past Surgical History:  Procedure Laterality Date   ABDOMINAL HYSTERECTOMY     fibroids, partial   BREAST REDUCTION SURGERY  2005   COLONOSCOPY WITH PROPOFOL N/A 05/10/2019   Two 5 to 6 mm polyps in the sigmoid colon and in the ascending colon, removed with a   GASTRIC BYPASS  2007   POLYPECTOMY  05/10/2019   Procedure: POLYPECTOMY;  Surgeon: Daneil Dolin, MD;  Location[facility];  Service: Endoscopy;;  colon   Social History   Socioeconomic History   Marital status: Single    Spouse name: Not on file   Number of children: 2   Years of education: 12   Highest education level: Not on file  Occupational History   Not on file  Tobacco Use   Smoking status: Never   Smokeless tobacco: Never  Vaping Use   Vaping Use: Never used  Substance and Sexual Activity   Alcohol use: No   Drug use: No   Sexual activity: Yes    Birth control/protection: Condom  Other Topics Concern   Not on file  Social History Narrative   Right handed   Caffeine use: tea/soda twice weekly   Social Determinants of Health   Financial Resource Strain: Not on file  Food Insecurity: Not on file  Transportation Needs: Not on file  Physical Activity: Not on file  Stress: Not on file  Social Connections: Not on file   Family History  Problem Relation Age of Onset   Hypertension Mother    Diabetes Mother    Drug abuse Mother    Hypertension Father    Hypertension Sister    Hypertension Sister    Colon cancer Neg Hx    Outpatient Encounter Medications as of 04/04/2022  Medication Sig   [START ON 04/25/2022]  ALPRAZolam (XANAX) 0.25 MG tablet Take 1 tablet (0.25 mg total) by mouth 2 (two) times daily as needed for anxiety.   amLODipine (NORVASC) 10 MG tablet Take 1 tablet by mouth once daily   cariprazine (VRAYLAR) 1.5 MG capsule Take 1 capsule (1.5 mg total) by mouth daily.   Cyanocobalamin (B-12 PO) Take 2,500 mcg by mouth daily.   escitalopram (LEXAPRO) 20 MG tablet Take 1 tablet (20 mg total) by mouth daily.   [START ON 04/13/2022] Eszopiclone 3 MG TABS Take 1 tablet (3 mg total) by mouth at bedtime as needed. Take immediately before bedtime   lamoTRIgine (LAMICTAL) 25 MG tablet Week 1 25 mg bid po, Week 2- 25 mg in AM and 50 mg in PM, Week 3- 50 mg bid po, Week 4- 75 mg bid po, Week 5 100 mg bid po   ondansetron (ZOFRAN) 4 MG tablet Take one tablet by mouth every 12 hours as needed, for  nausea   rizatriptan (MAXALT-MLT) 10 MG disintegrating  tablet Take 1 tablet (10 mg total) by mouth as needed for migraine. May repeat in 2 hours if needed   spironolactone (ALDACTONE) 100 MG tablet Take 1 tablet by mouth once daily   Vitamin D, Ergocalciferol, (DRISDOL) 1.25 MG (50000 UNIT) CAPS capsule Take 1 capsule by mouth once a week   No facility-administered encounter medications on file as of 04/04/2022.   ALLERGIES: Allergies  Allergen Reactions   Ketorolac Tromethamine Hives    Lips swelled    VACCINATION STATUS: Immunization History  Administered Date(s) Administered   Influenza Whole 08/24/2009, 07/25/2010, 09/28/2016   Influenza, High Dose Seasonal PF 11/18/2020   Influenza,inj,Quad PF,6+ Mos 07/09/2018, 07/19/2019, 09/21/2021   Janssen (J&J) SARS-COV-2 Vaccination 01/30/2020, 11/18/2020   Td 07/25/2010   Zoster Recombinat (Shingrix) 05/08/2021, 09/21/2021    HPI Olivia Woodward is 56 y.o. female who presents today with a medical history as above. she is being seen in follow-up after she was seen in consultation for multinodular goiter which was subsequently confirmed to be toxic  multinodular goiter.  She is status post RAI thyroid ablation on June 20, 2019.  Her subsequent labs which are consistent with treatment effect, without hypothyroidism.    PMD: Fayrene Helper, MD.  She feels better, has no new complaints today.  Her previsit thyroid function tests are consistent with euthyroid state.  She denies palpitations, tremors, nor heat intolerance. She denies any dysphagia, shortness of breath, nor voice change.  However, she feels dry mouth and dry throat.  Her repeat thyroid ultrasound showed decrease in the general size of her thyroid lobes bilaterally.  She did not have new findings.   She reports steady weight over the last 3 visits.   Review of Systems  Constitutional:  no fatigue, no subjective hyperthermia, no subjective hypothermia   Objective:       04/04/2022    8:39 AM 03/25/2022    2:20 PM 02/07/2022    8:05 AM  Vitals with BMI  Height '5\' 6"'$  '5\' 6"'$  '5\' 6"'$   Weight 248 lbs 10 oz 244 lbs 247 lbs 2 oz  BMI 40.14 40.9 81.19  Systolic 147 829 562  Diastolic 82 87 83  Pulse 84 66 85    BP 126/82   Pulse 84   Ht '5\' 6"'$  (1.676 m)   Wt 248 lb 9.6 oz (112.8 kg)   BMI 40.13 kg/m   Wt Readings from Last 3 Encounters:  04/04/22 248 lb 9.6 oz (112.8 kg)  03/25/22 244 lb (110.7 kg)  02/07/22 247 lb 1.9 oz (112.1 kg)    Physical Exam  Constitutional:  Body mass index is 40.13 kg/m.,  not in acute distress, normal state of mind Eyes: PERRLA, EOMI, no exophthalmos ENT: moist mucous membranes, + decrease in the size of her thyroid compared to last exam,  no gross cervical lymphadenopathy    CMP ( most recent) CMP     Component Value Date/Time   NA 138 03/22/2021 0930   K 4.7 03/22/2021 0930   CL 103 03/22/2021 0930   CO2 17 (L) 03/22/2021 0930   GLUCOSE 99 03/22/2021 0930   GLUCOSE 91 10/04/2020 1618   BUN 13 03/22/2021 0930   CREATININE 1.02 (H) 03/22/2021 0930   CREATININE 0.71 07/10/2018 1015   CALCIUM 9.4 03/22/2021 0930   PROT  7.6 03/22/2021 0930   ALBUMIN 3.9 03/22/2021 0930   AST 13 03/22/2021 0930   ALT 6 03/22/2021 0930   ALKPHOS 59 03/22/2021 0930   BILITOT  0.3 03/22/2021 0930   GFRNONAA >60 10/04/2020 1618   GFRNONAA 100 07/10/2018 1015   GFRAA 87 09/06/2020 0843   GFRAA 116 07/10/2018 1015     Diabetic Labs (most recent): Lab Results  Component Value Date   HGBA1C 5.4 03/22/2021   HGBA1C 5.3 07/26/2010     Lipid Panel ( most recent) Lipid Panel     Component Value Date/Time   CHOL 164 03/22/2021 0930   TRIG 73 03/22/2021 0930   HDL 69 03/22/2021 0930   CHOLHDL 2.4 03/22/2021 0930   CHOLHDL 2.0 12/27/2017 0949   VLDL 10 01/02/2017 1536   LDLCALC 81 03/22/2021 0930   LDLCALC 71 12/27/2017 0949   LABVLDL 14 03/22/2021 0930      Lab Results  Component Value Date   TSH 2.930 04/03/2022   TSH 2.542 10/04/2021   TSH 0.820 08/02/2021   TSH 0.409 (L) 03/22/2021   TSH 0.278 (L) 09/06/2020   TSH 0.279 (L) 09/06/2020   TSH 0.375 11/19/2018   TSH 0.339 (L) 10/26/2018   TSH 0.73 12/27/2017   TSH 0.58 01/02/2017   FREET4 1.05 04/03/2022   FREET4 0.94 10/04/2021   FREET4 0.74 08/02/2021   FREET4 1.12 09/06/2020   FREET4 CANCELED 10/26/2018   FREET4 1.10 07/26/2010      Assessment & Plan:   1. Toxic multinodular goiter 2. Subclinical hyperthyroidism-resolved She is status post RAI ablation of toxic thyroid nodule on the left lobe on June 19, 2021.  Her previsit thyroid function tests remain consistent with euthyroid state.  She will not need initiation of thyroid hormone for now.   She is made aware of the need for continued monitoring of thyroid function at least every 6 months for now to not miss a chance to initiate thyroid hormone replacement on time.  She has history of benign FNA from one of the right sided nodules in 2021.  Her most recent thyroid ultrasound confirmed shrinking in the size of her thyroid generally, and did not reveal any new concerning thyroid nodules.  She  will not need surgical intervention at this time.   - she is advised to maintain close follow up with Fayrene Helper, MD for primary care needs.   I spent 21 minutes in the care of the patient today including review of labs from Thyroid Function, CMP, and other relevant labs ; imaging/biopsy records (current and previous including abstractions from other facilities); face-to-face time discussing  her lab results and symptoms, medications doses, her options of short and long term treatment based on the latest standards of care / guidelines;   and documenting the encounter.  [place]  participated in the discussions, expressed understanding, and voiced agreement with the above plans.  All questions were answered to her satisfaction. she is encouraged to contact clinic should she have any questions or concerns prior to her return visit.    Follow up plan: Return in about 6 months (around 10/05/2022) for F/U with Pre-visit Labs.   Glade Lloyd, MD [facility] [address] [place], [place] [address] Phone: [phone]  Fax: [phone]     04/04/2022, 2:49 PM  This note was partially dictated with voice recognition software. Similar sounding words can be transcribed inadequately or may not  be corrected upon review.

## 2022-04-16 ENCOUNTER — Encounter: Payer: Self-pay | Admitting: Internal Medicine

## 2022-04-16 ENCOUNTER — Telehealth: Payer: Self-pay | Admitting: Neurology

## 2022-04-16 ENCOUNTER — Encounter: Payer: Self-pay | Admitting: Family Medicine

## 2022-04-16 ENCOUNTER — Ambulatory Visit (INDEPENDENT_AMBULATORY_CARE_PROVIDER_SITE_OTHER): Payer: Managed Care, Other (non HMO) | Admitting: Family Medicine

## 2022-04-16 VITALS — BP 130/84 | HR 75 | Ht 66.0 in | Wt 249.0 lb

## 2022-04-16 DIAGNOSIS — R112 Nausea with vomiting, unspecified: Secondary | ICD-10-CM | POA: Diagnosis not present

## 2022-04-16 DIAGNOSIS — I10 Essential (primary) hypertension: Secondary | ICD-10-CM

## 2022-04-16 DIAGNOSIS — Z23 Encounter for immunization: Secondary | ICD-10-CM | POA: Insufficient documentation

## 2022-04-16 DIAGNOSIS — D508 Other iron deficiency anemias: Secondary | ICD-10-CM

## 2022-04-16 DIAGNOSIS — K219 Gastro-esophageal reflux disease without esophagitis: Secondary | ICD-10-CM

## 2022-04-16 DIAGNOSIS — R1310 Dysphagia, unspecified: Secondary | ICD-10-CM | POA: Insufficient documentation

## 2022-04-16 DIAGNOSIS — F419 Anxiety disorder, unspecified: Secondary | ICD-10-CM

## 2022-04-16 DIAGNOSIS — R1319 Other dysphagia: Secondary | ICD-10-CM

## 2022-04-16 DIAGNOSIS — G43009 Migraine without aura, not intractable, without status migrainosus: Secondary | ICD-10-CM

## 2022-04-16 DIAGNOSIS — E559 Vitamin D deficiency, unspecified: Secondary | ICD-10-CM

## 2022-04-16 DIAGNOSIS — R1013 Epigastric pain: Secondary | ICD-10-CM

## 2022-04-16 DIAGNOSIS — Z13220 Encounter for screening for lipoid disorders: Secondary | ICD-10-CM

## 2022-04-16 MED ORDER — ONDANSETRON HCL 4 MG PO TABS: ORAL_TABLET | ORAL | 2 refills | Status: DC

## 2022-04-16 MED ORDER — PANTOPRAZOLE SODIUM 20 MG PO TBEC: 20.0000 mg | DELAYED_RELEASE_TABLET | Freq: Every day | ORAL | 1 refills | Status: DC

## 2022-04-16 MED ORDER — RIZATRIPTAN BENZOATE 10 MG PO TBDP: 10.0000 mg | ORAL_TABLET | ORAL | 3 refills | Status: DC | PRN

## 2022-04-16 NOTE — Assessment & Plan Note (Signed)
Symptomatic with solid dysphagia in past  6 mponths, check H pylori start protonix and GI eval

## 2022-04-16 NOTE — Telephone Encounter (Signed)
redoing Novella Rob pending faxed notes

## 2022-04-16 NOTE — Assessment & Plan Note (Signed)
  Patient re-educated about  the importance of commitment to a  minimum of 150 minutes of exercise per week as able.  The importance of healthy food choices with portion control discussed, as well as eating regularly and within a 12 hour window most days. The need to choose "clean , green" food 50 to 75% of the time is discussed, as well as to make water the primary drink and set a goal of 64 ounces water daily.       04/16/2022    8:13 AM 04/04/2022    8:39 AM 03/25/2022    2:20 PM  Weight /BMI  Weight 249 lb 0.6 oz 248 lb 9.6 oz 244 lb  Height '5\' 6"'$  (1.676 m) '5\' 6"'$  (1.676 m) '5\' 6"'$  (1.676 m)  BMI 40.2 kg/m2 40.13 kg/m2 39.38 kg/m2

## 2022-04-16 NOTE — Assessment & Plan Note (Signed)
Needs evaluation of gall bladder and also refer to gI, symptoms of gERD, start priotonix and zofran

## 2022-04-16 NOTE — Assessment & Plan Note (Signed)
Uncontrolled re frequeny reports response to maxalt, Neurology managing started on lamictal

## 2022-04-16 NOTE — Progress Notes (Signed)
   Olivia Woodward     MRN: [id number]      DOB: November 13, 1966   HPI Olivia Woodward is here for follow up and re-evaluation of chronic medical conditions, medication management and review of any available recent lab and radiology data.  Preventive health is updated, specifically  Cancer screening and Immunization.   Questions or concerns regarding consultations or procedures which the PT has had in the interim are  addressed.Has seen Neurology recently, topamax chnaged  to lamictal, in 6 months reports on avg 8 headaches/ month associated with increaased nausea,  someties vomits. Otherwisehas nausea  and bloating 3/7 days/week Depression improving and she is working full time No regular exercise but is wlling to start.     ROS Denies recent fever or chills. Denies sinus pressure, nasal congestion, ear pain or sore throat. Denies chest congestion, productive cough or wheezing. Denies chest pains, palpitations and leg swelling Denies abdominal pain, ,diarrhea or constipation.   Denies dysuria, frequency, hesitancy or incontinence. Denies joint pain, swelling and limitation in mobility. . Denies skin break down or rash.   PE  BP 130/84   Pulse 75   Ht '5\' 6"'$  (1.676 m)   Wt 249 lb 0.6 oz (113 kg)   SpO2 94%   BMI 40.20 kg/m   Patient alert and oriented and in no cardiopulmonary distress.  HEENT: No facial asymmetry, EOMI,     Neck supple .  Chest: Clear to auscultation bilaterally.  CVS: S1, S2 no murmurs, no S3.Regular rate.  ABD: Soft non tender.   Ext: No edema  MS: Adequate ROM spine, shoulders, hips and knees.  Skin: Intact, no ulcerations or rash noted.  Psych: Good eye contact, normal affect. Memory intact not anxious or depressed appearing.  CNS: CN 2-12 intact, power,  normal throughout.no focal deficits noted.   Assessment & Plan  Nausea & vomiting Needs evaluation of gall bladder and also refer to gI, symptoms of gERD, start priotonix and zofran  Morbid  obesity ([place])  Patient re-educated about  the importance of commitment to a  minimum of 150 minutes of exercise per week as able.  The importance of healthy food choices with portion control discussed, as well as eating regularly and within a 12 hour window most days. The need to choose "clean , green" food 50 to 75% of the time is discussed, as well as to make water the primary drink and set a goal of 64 ounces water daily.       04/16/2022    8:13 AM 04/04/2022    8:39 AM 03/25/2022    2:20 PM  Weight /BMI  Weight 249 lb 0.6 oz 248 lb 9.6 oz 244 lb  Height '5\' 6"'$  (1.676 m) '5\' 6"'$  (1.676 m) '5\' 6"'$  (1.676 m)  BMI 40.2 kg/m2 40.13 kg/m2 39.38 kg/m2      Migraine headache Uncontrolled re frequeny reports response to maxalt, Neurology managing started on lamictal  Anxiety Treated by psuych, improving  GERD (gastroesophageal reflux disease) Symptomatic with solid dysphagia in past  6 mponths, check H pylori start protonix and GI eval  Need for Tdap vaccination After obtaining informed consent, the vaccine is  administered , with no adverse effect noted at the time of administration.

## 2022-04-16 NOTE — Patient Instructions (Addendum)
Annual exam in 8 to 10 weks, call if you need me soone  TDaP today  Labs today, cBC, B12, lipid, cmp and EGFR, vit D and H pylori test  It is important that you exercise regularly at least 30 minutes 5 times a week. If you develop chest pain, have severe difficulty breathing, or feel very tired, stop exercising immediately and seek medical attention   New for reflux is once daily PROTONIX and you are  referred for US of gall BLADDER AS WELL AS TO SEE GI  Thanks for choosing [facility], we consider it a privelige to serve you.  

## 2022-04-16 NOTE — Assessment & Plan Note (Signed)
After obtaining informed consent, the vaccine is  administered , with no adverse effect noted at the time of administration.  

## 2022-04-16 NOTE — Assessment & Plan Note (Signed)
Treated by psuych, improving

## 2022-04-17 ENCOUNTER — Other Ambulatory Visit: Payer: Self-pay | Admitting: Family Medicine

## 2022-04-17 ENCOUNTER — Other Ambulatory Visit: Payer: Self-pay

## 2022-04-17 DIAGNOSIS — D508 Other iron deficiency anemias: Secondary | ICD-10-CM

## 2022-04-17 MED ORDER — ERGOCALCIFEROL 1.25 MG (50000 UT) PO CAPS
50000.0000 [IU] | ORAL_CAPSULE | ORAL | 2 refills | Status: DC
Start: 2022-04-17 — End: 2023-02-04

## 2022-04-17 MED ORDER — VITAMIN B12 1000 MCG PO TBCR: EXTENDED_RELEASE_TABLET | ORAL | 2 refills | Status: AC

## 2022-04-17 NOTE — Telephone Encounter (Signed)
Olivia Woodward: [id number] (exp. 04/16/22 to 07/16/22) patient is scheduled at [facility] for 04/28/22 at 8 pm.

## 2022-04-18 LAB — CMP14+EGFR
ALT: 9 IU/L (ref 0–32)
AST: 13 IU/L (ref 0–40)
Albumin/Globulin Ratio: 1.8 (ref 1.2–2.2)
Albumin: 4.4 g/dL (ref 3.8–4.9)
Alkaline Phosphatase: 39 IU/L — ABNORMAL LOW (ref 44–121)
BUN/Creatinine Ratio: 8 — ABNORMAL LOW (ref 9–23)
BUN: 9 mg/dL (ref 6–24)
Bilirubin Total: 0.5 mg/dL (ref 0.0–1.2)
CO2: 22 mmol/L (ref 20–29)
Calcium: 9.6 mg/dL (ref 8.7–10.2)
Chloride: 106 mmol/L (ref 96–106)
Creatinine, Ser: 1.12 mg/dL — ABNORMAL HIGH (ref 0.57–1.00)
Globulin, Total: 2.5 g/dL (ref 1.5–4.5)
Glucose: 86 mg/dL (ref 70–99)
Potassium: 4.2 mmol/L (ref 3.5–5.2)
Sodium: 142 mmol/L (ref 134–144)
Total Protein: 6.9 g/dL (ref 6.0–8.5)
eGFR: 59 mL/min/{1.73_m2} — ABNORMAL LOW (ref >59)

## 2022-04-18 LAB — VITAMIN B12: Vitamin B-12: 150 pg/mL — ABNORMAL LOW (ref 232–1245)

## 2022-04-18 LAB — LIPID PANEL
Chol/HDL Ratio: 2.6 ratio (ref 0.0–4.4)
Cholesterol, Total: 177 mg/dL (ref 100–199)
HDL: 67 mg/dL (ref >39)
LDL Chol Calc (NIH): 101 mg/dL — ABNORMAL HIGH (ref 0–99)
Triglycerides: 43 mg/dL (ref 0–149)
VLDL Cholesterol Cal: 9 mg/dL (ref 5–40)

## 2022-04-18 LAB — CBC
Hematocrit: 37.9 % (ref 34.0–46.6)
Hemoglobin: 12.8 g/dL (ref 11.1–15.9)
MCH: 32.4 pg (ref 26.6–33.0)
MCHC: 33.8 g/dL (ref 31.5–35.7)
MCV: 96 fL (ref 79–97)
Platelets: 307 10*3/uL (ref 150–450)
RBC: 3.95 x10E6/uL (ref 3.77–5.28)
RDW: 12.3 % (ref 11.7–15.4)
WBC: 5.1 10*3/uL (ref 3.4–10.8)

## 2022-04-18 LAB — VITAMIN D 25 HYDROXY (VIT D DEFICIENCY, FRACTURES): Vit D, 25-Hydroxy: 22.8 ng/mL — ABNORMAL LOW (ref 30.0–100.0)

## 2022-04-18 LAB — H. PYLORI ANTIBODY, IGG: H. pylori, IgG AbS: 0.12 Index Value (ref 0.00–0.79)

## 2022-04-19 LAB — IRON: Iron: 71 ug/dL (ref 27–159)

## 2022-04-19 LAB — FERRITIN: Ferritin: 23 ng/mL (ref 15–150)

## 2022-04-19 LAB — SPECIMEN STATUS REPORT

## 2022-04-24 ENCOUNTER — Ambulatory Visit (HOSPITAL_COMMUNITY)
Admission: RE | Admit: 2022-04-24 | Discharge: 2022-04-24 | Disposition: A | Discharge status: Home / Self Care | Payer: Managed Care, Other (non HMO) | Source: Ambulatory Visit | Attending: Family Medicine | Admitting: Family Medicine

## 2022-04-24 DIAGNOSIS — R112 Nausea with vomiting, unspecified: Secondary | ICD-10-CM | POA: Diagnosis present

## 2022-04-25 ENCOUNTER — Other Ambulatory Visit: Payer: Self-pay | Admitting: Family Medicine

## 2022-04-25 DIAGNOSIS — K8020 Calculus of gallbladder without cholecystitis without obstruction: Secondary | ICD-10-CM

## 2022-04-25 DIAGNOSIS — R110 Nausea: Secondary | ICD-10-CM

## 2022-04-29 ENCOUNTER — Encounter (HOSPITAL_COMMUNITY): Payer: Self-pay | Admitting: Hematology

## 2022-05-09 ENCOUNTER — Encounter: Payer: Self-pay | Admitting: General Surgery

## 2022-05-09 ENCOUNTER — Ambulatory Visit (INDEPENDENT_AMBULATORY_CARE_PROVIDER_SITE_OTHER): Payer: Managed Care, Other (non HMO) | Admitting: General Surgery

## 2022-05-09 VITALS — BP 120/80 | HR 79 | Temp 97.3°F | Resp 12 | Ht 66.0 in | Wt 242.0 lb

## 2022-05-09 DIAGNOSIS — K8020 Calculus of gallbladder without cholecystitis without obstruction: Secondary | ICD-10-CM
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
## 2022-05-09 NOTE — H&P (Signed)
Olivia Woodward; [id number]; 02/22/1966   HPI Patient is a 56 year old black female who was referred to my care by Dr. Moshe Cipro for evaluation and treatment of cholelithiasis.  Patient has been having ongoing nausea and belching with intermittent upper abdominal pain towards the right costal margin for many months.  It is becoming more frequent recently.  Is occurring with all foods.  She denies any fever, chills or jaundice.  She is status post gastric bypass in the remote past for weight reduction.  She lost about 160 pounds.  She was recently put on PPI therapy and this has not been helpful. Past Medical History:  Diagnosis Date   Anemia    Anxiety    Phreesia 03/18/2021   Depression    Phreesia 03/18/2021   Generalized headaches    Hypertension     Past Surgical History:  Procedure Laterality Date   ABDOMINAL HYSTERECTOMY     fibroids, partial   BREAST REDUCTION SURGERY  2005   COLONOSCOPY WITH PROPOFOL N/A 05/10/2019   Two 5 to 6 mm polyps in the sigmoid colon and in the ascending colon, removed with a   GASTRIC BYPASS  2007   POLYPECTOMY  05/10/2019   Procedure: POLYPECTOMY;  Surgeon: Daneil Dolin, MD;  [facility];  Service: Endoscopy;;  colon    Family History  Problem Relation Age of Onset   Hypertension Mother    Diabetes Mother    Drug abuse Mother    Hypertension Father    Hypertension Sister    Hypertension Sister    Colon cancer Neg Hx     Current Outpatient Medications on File Prior to Visit  Medication Sig Dispense Refill   ALPRAZolam (XANAX) 0.25 MG tablet Take 1 tablet (0.25 mg total) by mouth 2 (two) times daily as needed for anxiety. 60 tablet 1   amLODipine (NORVASC) 10 MG tablet Take 1 tablet by mouth once daily 90 tablet 0   cariprazine (VRAYLAR) 1.5 MG capsule Take 1 capsule (1.5 mg total) by mouth daily. 30 capsule 1   Cyanocobalamin (B-12 PO) Take 2,500 mcg by mouth daily.     Cyanocobalamin (VITAMIN B12) 1000 MCG TBCR Take 2 tablets  by mouth once daily 180 tablet 2   ergocalciferol (VITAMIN D2) 1.25 MG (50000 UT) capsule Take 1 capsule (50,000 Units total) by mouth once a week. One capsule once weekly 12 capsule 2   escitalopram (LEXAPRO) 20 MG tablet Take 1 tablet (20 mg total) by mouth daily. 30 tablet 2   Eszopiclone 3 MG TABS Take 1 tablet (3 mg total) by mouth at bedtime as needed. Take immediately before bedtime 30 tablet 1   lamoTRIgine (LAMICTAL) 25 MG tablet Week 1 25 mg bid po, Week 2- 25 mg in AM and 50 mg in PM, Week 3- 50 mg bid po, Week 4- 75 mg bid po, Week 5 100 mg bid po 161 tablet 0   ondansetron (ZOFRAN) 4 MG tablet Take one tablet by mouth once daily, as needed, for nausea 30 tablet 2   pantoprazole (PROTONIX) 20 MG tablet Take 1 tablet (20 mg total) by mouth daily. 30 tablet 1   rizatriptan (MAXALT-MLT) 10 MG disintegrating tablet Take 1 tablet (10 mg total) by mouth as needed for migraine. May repeat in 2 hours if needed 10 tablet 0   rizatriptan (MAXALT-MLT) 10 MG disintegrating tablet Take 1 tablet (10 mg total) by mouth as needed for migraine. May repeat in 2 hours if needed 10  tablet 3   spironolactone (ALDACTONE) 100 MG tablet Take 1 tablet by mouth once daily 30 tablet 0   No current facility-administered medications on file prior to visit.    Allergies  Allergen Reactions   Ketorolac Tromethamine Hives    Lips swelled    Social History   Substance and Sexual Activity  Alcohol Use No    Social History   Tobacco Use  Smoking Status Never  Smokeless Tobacco Never    Review of Systems  Constitutional: Negative.   HENT:  Positive for sinus pain.   Eyes: Negative.   Respiratory: Negative.    Cardiovascular: Negative.   Gastrointestinal:  Positive for abdominal pain, heartburn and nausea.  Genitourinary: Negative.   Musculoskeletal: Negative.   Skin: Negative.   Endo/Heme/Allergies: Negative.   Psychiatric/Behavioral: Negative.      Objective   Vitals:   05/09/22 1115  BP:  120/80  Pulse: 79  Resp: 12  Temp: (!) 97.3 F (36.3 C)  SpO2: 93%    Physical Exam Vitals reviewed.  Constitutional:      Appearance: Normal appearance. She is obese. She is not ill-appearing.  HENT:     Head: Normocephalic and atraumatic.  Eyes:     General: No scleral icterus. Cardiovascular:     Rate and Rhythm: Normal rate and regular rhythm.     Heart sounds: Normal heart sounds. No murmur heard.    No friction rub. No gallop.  Pulmonary:     Effort: Pulmonary effort is normal. No respiratory distress.     Breath sounds: Normal breath sounds. No stridor. No wheezing, rhonchi or rales.  Abdominal:     General: Bowel sounds are normal. There is no distension.     Palpations: Abdomen is soft. There is no mass.     Tenderness: There is abdominal tenderness. There is no guarding or rebound.     Hernia: No hernia is present.     Comments: Discomfort to deep palpation in the right upper quadrant over the gallbladder.  Skin:    General: Skin is warm and dry.  Neurological:     Mental Status: She is alert and oriented to person, place, and time.    Dr. Griffin Dakin notes were reviewed.  Ultrasound the gallbladder reveals cholelithiasis with normal common bile duct.  LFTs were within normal limits in May 2023 Assessment  Biliary colic secondary to cholelithiasis Plan  Patient is scheduled for laparoscopic cholecystectomy on 05/27/2022.  The risks and benefits of the procedure including bleeding, infection, hepatobiliary injury, and the possibility of an open procedure were fully explained to the patient, who gave informed consent.

## 2022-05-11 ENCOUNTER — Encounter (HOSPITAL_COMMUNITY): Payer: Self-pay | Admitting: Hematology

## 2022-05-15 ENCOUNTER — Encounter: Payer: Self-pay | Admitting: Psychiatry

## 2022-05-15 ENCOUNTER — Encounter (HOSPITAL_COMMUNITY): Payer: Self-pay | Admitting: Hematology

## 2022-05-15 ENCOUNTER — Telehealth (INDEPENDENT_AMBULATORY_CARE_PROVIDER_SITE_OTHER): Payer: 59 | Admitting: Psychiatry

## 2022-05-15 DIAGNOSIS — F331 Major depressive disorder, recurrent, moderate: Secondary | ICD-10-CM

## 2022-05-15 DIAGNOSIS — G4700 Insomnia, unspecified: Secondary | ICD-10-CM | POA: Diagnosis not present

## 2022-05-15 MED ORDER — ESCITALOPRAM OXALATE 20 MG PO TABS
20.0000 mg | ORAL_TABLET | Freq: Every day | ORAL | 2 refills | Status: DC
Start: 2022-05-27 — End: 2022-08-21

## 2022-05-15 MED ORDER — CARIPRAZINE HCL 1.5 MG PO CAPS: 1.5000 mg | ORAL_CAPSULE | Freq: Every day | ORAL | 2 refills | Status: DC

## 2022-05-23 ENCOUNTER — Encounter (HOSPITAL_COMMUNITY)
Admission: RE | Admit: 2022-05-23 | Discharge: 2022-05-23 | Disposition: A | Discharge status: Home / Self Care | Payer: Managed Care, Other (non HMO) | Source: Ambulatory Visit | Attending: General Surgery | Admitting: General Surgery

## 2022-05-24 ENCOUNTER — Other Ambulatory Visit: Payer: Self-pay | Admitting: Family Medicine

## 2022-05-27 ENCOUNTER — Ambulatory Visit (HOSPITAL_BASED_OUTPATIENT_CLINIC_OR_DEPARTMENT_OTHER): Payer: Managed Care, Other (non HMO) | Admitting: Anesthesiology

## 2022-05-27 ENCOUNTER — Encounter (HOSPITAL_COMMUNITY)
Admission: RE | Disposition: A | Discharge status: Home / Self Care | Payer: Self-pay | Source: Home / Self Care | Attending: General Surgery

## 2022-05-27 ENCOUNTER — Other Ambulatory Visit: Payer: Self-pay

## 2022-05-27 ENCOUNTER — Ambulatory Visit (HOSPITAL_COMMUNITY)
Admission: RE | Admit: 2022-05-27 | Discharge: 2022-05-27 | Disposition: A | Discharge status: Home / Self Care | Payer: Managed Care, Other (non HMO) | Attending: General Surgery | Admitting: General Surgery

## 2022-05-27 ENCOUNTER — Encounter (HOSPITAL_COMMUNITY): Payer: Self-pay | Admitting: General Surgery

## 2022-05-27 ENCOUNTER — Ambulatory Visit (HOSPITAL_COMMUNITY): Payer: Managed Care, Other (non HMO) | Admitting: Anesthesiology

## 2022-05-27 DIAGNOSIS — K8010 Calculus of gallbladder with chronic cholecystitis without obstruction: Secondary | ICD-10-CM | POA: Insufficient documentation

## 2022-05-27 DIAGNOSIS — K8020 Calculus of gallbladder without cholecystitis without obstruction: Secondary | ICD-10-CM

## 2022-05-27 DIAGNOSIS — D759 Disease of blood and blood-forming organs, unspecified: Secondary | ICD-10-CM | POA: Insufficient documentation

## 2022-05-27 DIAGNOSIS — R519 Headache, unspecified: Secondary | ICD-10-CM | POA: Insufficient documentation

## 2022-05-27 DIAGNOSIS — Z9884 Bariatric surgery status: Secondary | ICD-10-CM | POA: Insufficient documentation

## 2022-05-27 DIAGNOSIS — I10 Essential (primary) hypertension: Secondary | ICD-10-CM | POA: Insufficient documentation

## 2022-05-27 DIAGNOSIS — Z79899 Other long term (current) drug therapy: Secondary | ICD-10-CM | POA: Diagnosis not present

## 2022-05-27 DIAGNOSIS — F419 Anxiety disorder, unspecified: Secondary | ICD-10-CM | POA: Diagnosis not present

## 2022-05-27 DIAGNOSIS — K8070 Calculus of gallbladder and bile duct without cholecystitis without obstruction: Secondary | ICD-10-CM

## 2022-05-27 DIAGNOSIS — D649 Anemia, unspecified: Secondary | ICD-10-CM | POA: Diagnosis not present

## 2022-05-27 DIAGNOSIS — F32A Depression, unspecified: Secondary | ICD-10-CM | POA: Insufficient documentation

## 2022-05-27 HISTORY — PX: CHOLECYSTECTOMY: SHX55

## 2022-05-27 SURGERY — LAPAROSCOPIC CHOLECYSTECTOMY
Anesthesia: General | Site: Abdomen

## 2022-05-27 MED ORDER — CEFAZOLIN SODIUM-DEXTROSE 2-4 GM/100ML-% IV SOLN
2.0000 g | INTRAVENOUS | Status: AC
Start: 2022-05-27 — End: 2022-05-27
  Administered 2022-05-27: 2 g via INTRAVENOUS
  Filled 2022-05-27: qty 100

## 2022-05-27 MED ORDER — CHLORHEXIDINE GLUCONATE CLOTH 2 % EX PADS: 6.0000 | MEDICATED_PAD | Freq: Once | CUTANEOUS | Status: DC

## 2022-05-27 MED ORDER — ONDANSETRON HCL 4 MG/2ML IJ SOLN
INTRAMUSCULAR | Status: AC
  Filled 2022-05-27: qty 2

## 2022-05-27 MED ORDER — SCOPOLAMINE 1 MG/3DAYS TD PT72: 1.0000 | MEDICATED_PATCH | Freq: Once | TRANSDERMAL | Status: DC

## 2022-05-27 MED ORDER — DEXAMETHASONE SODIUM PHOSPHATE 10 MG/ML IJ SOLN
INTRAMUSCULAR | Status: AC
  Filled 2022-05-27: qty 1

## 2022-05-27 MED ORDER — ONDANSETRON HCL 4 MG/2ML IJ SOLN
INTRAMUSCULAR | Status: AC
  Administered 2022-05-27: 4 mg via INTRAVENOUS
  Filled 2022-05-27: qty 2

## 2022-05-27 MED ORDER — FENTANYL CITRATE (PF) 100 MCG/2ML IJ SOLN
INTRAMUSCULAR | Status: AC
  Filled 2022-05-27: qty 2

## 2022-05-27 MED ORDER — MEPERIDINE HCL 50 MG/ML IJ SOLN
6.2500 mg | INTRAMUSCULAR | Status: DC | PRN
  Administered 2022-05-27 (×2): 12.5 mg via INTRAVENOUS
  Filled 2022-05-27: qty 1

## 2022-05-27 MED ORDER — PROMETHAZINE HCL 25 MG/ML IJ SOLN
INTRAMUSCULAR | Status: DC | PRN
  Administered 2022-05-27: 12.5 mg via INTRAVENOUS

## 2022-05-27 MED ORDER — BUPIVACAINE LIPOSOME 1.3 % IJ SUSP
INTRAMUSCULAR | Status: AC
  Filled 2022-05-27: qty 20

## 2022-05-27 MED ORDER — BUPIVACAINE LIPOSOME 1.3 % IJ SUSP
INTRAMUSCULAR | Status: DC | PRN
  Administered 2022-05-27: 20 mL

## 2022-05-27 MED ORDER — LACTATED RINGERS IV SOLN: INTRAVENOUS | Status: DC

## 2022-05-27 MED ORDER — SCOPOLAMINE 1 MG/3DAYS TD PT72
MEDICATED_PATCH | TRANSDERMAL | Status: AC
  Administered 2022-05-27: 1.5 mg via TRANSDERMAL
  Filled 2022-05-27: qty 1

## 2022-05-27 MED ORDER — HYDROCODONE-ACETAMINOPHEN 5-325 MG PO TABS
1.0000 | ORAL_TABLET | Freq: Four times a day (QID) | ORAL | 0 refills | Status: DC | PRN
Start: 2022-05-27 — End: 2022-06-21

## 2022-05-27 MED ORDER — DEXAMETHASONE SODIUM PHOSPHATE 10 MG/ML IJ SOLN
INTRAMUSCULAR | Status: DC | PRN
  Administered 2022-05-27: 10 mg via INTRAVENOUS

## 2022-05-27 MED ORDER — LIDOCAINE HCL (PF) 2 % IJ SOLN
INTRAMUSCULAR | Status: AC
  Filled 2022-05-27: qty 5

## 2022-05-27 MED ORDER — ROCURONIUM BROMIDE 10 MG/ML (PF) SYRINGE
PREFILLED_SYRINGE | INTRAVENOUS | Status: AC
  Filled 2022-05-27: qty 10

## 2022-05-27 MED ORDER — ONDANSETRON HCL 4 MG/2ML IJ SOLN
4.0000 mg | Freq: Once | INTRAMUSCULAR | Status: DC | PRN
Start: 2022-05-27 — End: 2022-05-27

## 2022-05-27 MED ORDER — PROPOFOL 10 MG/ML IV BOLUS
INTRAVENOUS | Status: DC | PRN
  Administered 2022-05-27: 150 mg via INTRAVENOUS

## 2022-05-27 MED ORDER — METOCLOPRAMIDE HCL 5 MG/ML IJ SOLN
INTRAMUSCULAR | Status: AC
  Filled 2022-05-27: qty 2

## 2022-05-27 MED ORDER — HYDROMORPHONE HCL 1 MG/ML IJ SOLN
0.2500 mg | INTRAMUSCULAR | Status: DC | PRN
  Administered 2022-05-27 (×2): 0.5 mg via INTRAVENOUS
  Filled 2022-05-27 (×2): qty 0.5

## 2022-05-27 MED ORDER — MIDAZOLAM HCL 2 MG/2ML IJ SOLN
INTRAMUSCULAR | Status: AC
  Filled 2022-05-27: qty 2

## 2022-05-27 MED ORDER — ROCURONIUM BROMIDE 100 MG/10ML IV SOLN
INTRAVENOUS | Status: DC | PRN
  Administered 2022-05-27: 50 mg via INTRAVENOUS

## 2022-05-27 MED ORDER — SODIUM CHLORIDE BACTERIOSTATIC 0.9 % IJ SOLN
INTRAMUSCULAR | Status: AC
  Filled 2022-05-27: qty 10

## 2022-05-27 MED ORDER — METOCLOPRAMIDE HCL 5 MG/ML IJ SOLN
INTRAMUSCULAR | Status: DC | PRN
  Administered 2022-05-27: 10 mg via INTRAVENOUS

## 2022-05-27 MED ORDER — SUGAMMADEX SODIUM 200 MG/2ML IV SOLN
INTRAVENOUS | Status: DC | PRN
  Administered 2022-05-27: 200 mg via INTRAVENOUS

## 2022-05-27 MED ORDER — HEMOSTATIC AGENTS (NO CHARGE) OPTIME
TOPICAL | Status: DC | PRN
  Administered 2022-05-27: 1 via TOPICAL

## 2022-05-27 MED ORDER — PROMETHAZINE HCL 25 MG/ML IJ SOLN
INTRAMUSCULAR | Status: AC
  Filled 2022-05-27: qty 1

## 2022-05-27 MED ORDER — SODIUM CHLORIDE 0.9 % IR SOLN
Status: DC | PRN
  Administered 2022-05-27: 1000 mL

## 2022-05-27 MED ORDER — FENTANYL CITRATE (PF) 100 MCG/2ML IJ SOLN
INTRAMUSCULAR | Status: DC | PRN
  Administered 2022-05-27 (×2): 50 ug via INTRAVENOUS

## 2022-05-27 MED ORDER — ONDANSETRON HCL 4 MG/2ML IJ SOLN: 4.0000 mg | Freq: Once | INTRAMUSCULAR | Status: AC

## 2022-05-27 MED ORDER — ONDANSETRON HCL 4 MG/2ML IJ SOLN
INTRAMUSCULAR | Status: DC | PRN
  Administered 2022-05-27: 4 mg via INTRAVENOUS

## 2022-05-27 MED ORDER — LIDOCAINE HCL (CARDIAC) PF 100 MG/5ML IV SOSY
PREFILLED_SYRINGE | INTRAVENOUS | Status: DC | PRN
  Administered 2022-05-27: 80 mg via INTRAVENOUS

## 2022-05-27 SURGICAL SUPPLY — 54 items
ADH SKN CLS APL DERMABOND .7 (GAUZE/BANDAGES/DRESSINGS) ×1
APL ESCP 73.6OZ SRGCL (TIP)
APL PRP STRL LF DISP 70% ISPRP (MISCELLANEOUS) ×1
APL SWBSTK 6 STRL LF DISP (MISCELLANEOUS) ×2
APPLICATOR COTTON TIP 6 STRL (MISCELLANEOUS) IMPLANT
APPLICATOR COTTON TIP 6IN STRL (MISCELLANEOUS) ×4
APPLIER CLIP ROT 10 11.4 M/L (STAPLE) ×2
APR CLP MED LRG 11.4X10 (STAPLE) ×1
CHLORAPREP W/TINT 26 (MISCELLANEOUS) ×3 IMPLANT
CLIP APPLIE ROT 10 11.4 M/L (STAPLE) ×2 IMPLANT
CLOTH BEACON ORANGE TIMEOUT ST (SAFETY) ×3 IMPLANT
COVER LIGHT HANDLE STERIS (MISCELLANEOUS) ×6 IMPLANT
DERMABOND ADVANCED (GAUZE/BANDAGES/DRESSINGS) ×1
DERMABOND ADVANCED .7 DNX12 (GAUZE/BANDAGES/DRESSINGS) ×2 IMPLANT
ELECT REM PT RETURN 9FT ADLT (ELECTROSURGICAL) ×2
ELECTRODE REM PT RTRN 9FT ADLT (ELECTROSURGICAL) ×2 IMPLANT
GAUZE 4X4 16PLY ~~LOC~~+RFID DBL (SPONGE) ×1 IMPLANT
GAUZE SPONGE 4X4 12PLY STRL (GAUZE/BANDAGES/DRESSINGS) ×1 IMPLANT
GLOVE BIO SURGEON STRL SZ7 (GLOVE) ×1 IMPLANT
GLOVE BIOGEL PI IND STRL 7.0 (GLOVE) ×4 IMPLANT
GLOVE BIOGEL PI IND STRL 7.5 (GLOVE) IMPLANT
GLOVE BIOGEL PI INDICATOR 7.0 (GLOVE) ×2
GLOVE BIOGEL PI INDICATOR 7.5 (GLOVE) ×1
GLOVE ECLIPSE 6.5 STRL STRAW (GLOVE) ×1 IMPLANT
GLOVE SURG SS PI 7.5 STRL IVOR (GLOVE) ×3 IMPLANT
GOWN STRL REUS W/TWL LRG LVL3 (GOWN DISPOSABLE) ×9 IMPLANT
HEMOSTAT SNOW SURGICEL 2X4 (HEMOSTASIS) ×3 IMPLANT
INST SET LAPROSCOPIC AP (KITS) ×3 IMPLANT
KIT TURNOVER KIT A (KITS) ×3 IMPLANT
MANIFOLD NEPTUNE II (INSTRUMENTS) ×3 IMPLANT
NDL HYPO 18GX1.5 BLUNT FILL (NEEDLE) ×2 IMPLANT
NDL HYPO 21X1.5 SAFETY (NEEDLE) ×2 IMPLANT
NDL INSUFFLATION 14GA 120MM (NEEDLE) ×2 IMPLANT
NEEDLE HYPO 18GX1.5 BLUNT FILL (NEEDLE) ×2 IMPLANT
NEEDLE HYPO 21X1.5 SAFETY (NEEDLE) ×2 IMPLANT
NEEDLE INSUFFLATION 14GA 120MM (NEEDLE) ×2 IMPLANT
NS IRRIG 1000ML POUR BTL (IV SOLUTION) ×3 IMPLANT
PACK LAP CHOLE LZT030E (CUSTOM PROCEDURE TRAY) ×3 IMPLANT
PAD ARMBOARD 7.5X6 YLW CONV (MISCELLANEOUS) ×3 IMPLANT
POWDER SURGICEL 3.0 GRAM (HEMOSTASIS) IMPLANT
SET BASIN LINEN APH (SET/KITS/TRAYS/PACK) ×3 IMPLANT
SET TUBE SMOKE EVAC HIGH FLOW (TUBING) ×3 IMPLANT
SLEEVE Z-THREAD 5X100MM (TROCAR) ×3 IMPLANT
SUT MNCRL AB 4-0 PS2 18 (SUTURE) ×6 IMPLANT
SUT VICRYL 0 UR6 27IN ABS (SUTURE) ×3 IMPLANT
SYR 20ML LL LF (SYRINGE) ×6 IMPLANT
SYS BAG RETRIEVAL 10MM (BASKET) ×2
SYSTEM BAG RETRIEVAL 10MM (BASKET) ×2 IMPLANT
TIP ENDOSCOPIC SURGICEL (TIP) IMPLANT
TROCAR Z-THRD FIOS HNDL 11X100 (TROCAR) ×3 IMPLANT
TROCAR Z-THREAD FIOS 5X100MM (TROCAR) ×3 IMPLANT
TROCAR Z-THREAD SLEEVE 11X100 (TROCAR) ×3 IMPLANT
TUBE CONNECTING 12X1/4 (SUCTIONS) ×3 IMPLANT
WARMER LAPAROSCOPE (MISCELLANEOUS) ×3 IMPLANT

## 2022-05-27 NOTE — Anesthesia Procedure Notes (Signed)
Procedure Name: Intubation Date/Time: 05/27/2022 10:51 AM  Performed by: Denese Killings, MDPre-anesthesia Checklist: Patient identified and Emergency Drugs available Patient Re-evaluated:Patient Re-evaluated prior to induction Oxygen Delivery Method: Circle system utilized Preoxygenation: Pre-oxygenation with 100% oxygen Induction Type: IV induction Ventilation: Mask ventilation without difficulty Laryngoscope Size: Mac and 3 Grade View: Grade I Tube type: Oral Tube size: 7.0 mm Number of attempts: 1 Airway Equipment and Method: Stylet and Oral airway Placement Confirmation: ETT inserted through vocal cords under direct vision, positive ETCO2, breath sounds checked- equal and bilateral and CO2 detector Secured at: 21 cm Tube secured with: Tape Dental Injury: Teeth and Oropharynx as per pre-operative assessment

## 2022-05-27 NOTE — Anesthesia Preprocedure Evaluation (Signed)
Anesthesia Evaluation  Patient identified by MRN, date of birth, ID band Patient awake    Reviewed: Allergy & Precautions, NPO status , Patient's Chart, lab work & pertinent test results  Airway Mallampati: II  TM Distance: >3 FB Neck ROM: Full    Dental  (+) Dental Advisory Given, Missing   Pulmonary neg pulmonary ROS,    Pulmonary exam normal breath sounds clear to auscultation       Cardiovascular hypertension, Pt. on medications Normal cardiovascular exam Rhythm:Regular Rate:Normal  Stress test - 2020 Narrative & Impression No diagnostic ST changes to indicate ischemia. Small, mild intensity, mid to apical inferior and basal inferolateral defects that are partially reversible in the setting of radiotracer uptake within the gut near the inferior wall reducing the specificity of ischemia in this zone. This is a low risk study. Nuclear stress EF: 64%     Neuro/Psych  Headaches, PSYCHIATRIC DISORDERS Anxiety Depression    GI/Hepatic Neg liver ROS, GERD (c/o nausea)  Medicated and Controlled,  Endo/Other  Hyperthyroidism (subclinical)   Renal/GU negative Renal ROS  negative genitourinary   Musculoskeletal negative musculoskeletal ROS (+)   Abdominal   Peds negative pediatric ROS (+)  Hematology  (+) Blood dyscrasia, anemia ,   Anesthesia Other Findings   Reproductive/Obstetrics negative OB ROS                            Anesthesia Physical Anesthesia Plan  ASA: 2  Anesthesia Plan: General   Post-op Pain Management:    Induction: Intravenous, Rapid sequence and Cricoid pressure planned  PONV Risk Score and Plan: 4 or greater and Ondansetron, Dexamethasone, Midazolam, Scopolamine patch - Pre-op and Metaclopromide  Airway Management Planned: Oral ETT  Additional Equipment:   Intra-op Plan:   Post-operative Plan: Extubation in OR  Informed Consent: I have reviewed the patients  History and Physical, chart, labs and discussed the procedure including the risks, benefits and alternatives for the proposed anesthesia with the patient or authorized representative who has indicated his/her understanding and acceptance.     Dental advisory given  Plan Discussed with: CRNA and Surgeon  Anesthesia Plan Comments:         Anesthesia Quick Evaluation

## 2022-05-27 NOTE — Anesthesia Postprocedure Evaluation (Signed)
Anesthesia Post Note  Patient: Olivia Woodward  Procedure(s) Performed: LAPAROSCOPIC CHOLECYSTECTOMY (Abdomen)  Patient location during evaluation: PACU Anesthesia Type: General Level of consciousness: awake and alert Pain management: pain level controlled Vital Signs Assessment: post-procedure vital signs reviewed and stable Respiratory status: spontaneous breathing, nonlabored ventilation, respiratory function stable and patient connected to nasal cannula oxygen Cardiovascular status: blood pressure returned to baseline and stable Postop Assessment: no apparent nausea or vomiting Anesthetic complications: no   There were no known notable events for this encounter.   Last Vitals:  Vitals:   05/27/22 1149 05/27/22 1200  BP: (!) 142/110   Pulse: 77 72  Resp: 16 20  Temp:    SpO2: 97% 93%    Last Pain:  Vitals:   05/27/22 1200  TempSrc:   PainSc: 6                  Trixie Rude

## 2022-05-27 NOTE — Op Note (Signed)
Patient:  Olivia Woodward  DOB:  Feb 03, 1966  MRN:  [id number]   Preop Diagnosis: Biliary colic, cholelithiasis  Postop Diagnosis: Same  Procedure: Laparoscopic cholecystectomy  Surgeon: Aviva Signs, MD  Anes: General endotracheal  Indications: Patient is a 56 year old black female who presents with biliary colic secondary to cholelithiasis.  The risks and benefits of the procedure including bleeding, infection, hepatobiliary injury, and the possibility of an open procedure were fully explained to the patient, who gave informed consent.  Procedure note: The patient was placed in the supine position.  After induction of general endotracheal anesthesia, the abdomen was prepped and draped using the usual sterile technique with ChloraPrep.  Surgical site confirmation was performed.  A supraumbilical incision was made down to the fascia.  A Veress needle was introduced into the abdominal cavity and confirmation of placement was done using the saline drop test.  The abdomen was then insufflated to 15 mmHg pressure.  An 11 mm trocar was introduced into the abdominal cavity under direct visualization without difficulty.  The patient was placed in reverse Trendelenburg position and an additional 11 mm trocar was placed in the epigastric region and 5 mm trocars were placed the right upper quadrant and right flank regions.  Liver was inspected and noted to be within normal limits.  The gallbladder was retracted in a dynamic fashion in order to provide a critical view of the triangle of Calot.  The cystic duct was first identified.  Its junction to the infundibulum was fully identified.  Endoclips were placed proximally and distally on the cystic duct, and the cystic duct was divided.  This was likewise done the cystic artery.  The gallbladder was freed away from the gallbladder fossa using Bovie electrocautery.  The gallbladder was delivered through the epigastric trocar site using an Endo Catch bag.  The  gallbladder fossa was inspected and any bleeding was controlled using Bovie electrocautery.  Surgicel was placed in the gallbladder fossa.  All fluid and air were then evacuated from the abdominal cavity prior to the removal of the trocars.  All wounds were irrigated with normal saline.  All wounds were injected with Exparel.  All incisions were closed using a 4-0 Monocryl subcuticular suture.  Dermabond was applied.  All tape and needle counts were correct at the end of the procedure.  The patient was extubated in the operating room and transferred to PACU in stable condition.  Complications: None  EBL: Minimal  Specimen: Gallbladder

## 2022-05-27 NOTE — Anesthesia Postprocedure Evaluation (Signed)
Anesthesia Post Note  Patient: Olivia Woodward  Procedure(s) Performed: LAPAROSCOPIC CHOLECYSTECTOMY (Abdomen)  Patient location during evaluation: PACU Anesthesia Type: General Level of consciousness: awake and alert Pain management: pain level controlled Vital Signs Assessment: post-procedure vital signs reviewed and stable Respiratory status: spontaneous breathing, nonlabored ventilation, respiratory function stable and patient connected to nasal cannula oxygen Cardiovascular status: blood pressure returned to baseline and stable Postop Assessment: no apparent nausea or vomiting Anesthetic complications: no   There were no known notable events for this encounter.   Last Vitals:  Vitals:   05/27/22 0941  BP: 135/90  Pulse: 61  Resp: 19  Temp: 36.6 C  SpO2: 100%    Last Pain:  Vitals:   05/27/22 0941  TempSrc: Oral  PainSc: 0-No pain                 Trixie Rude

## 2022-05-27 NOTE — Transfer of Care (Signed)
Immediate Anesthesia Transfer of Care Note  Patient: Olivia Woodward  Procedure(s) Performed: LAPAROSCOPIC CHOLECYSTECTOMY (Abdomen)  Patient Location: PACU  Anesthesia Type:General  Level of Consciousness: awake, alert  and oriented  Airway & Oxygen Therapy: Patient connected to nasal cannula oxygen  Post-op Assessment: Report given to RN and Post -op Vital signs reviewed and stable  Post vital signs: Reviewed and stable  Last Vitals:  Vitals Value Taken Time  BP 139/76 05/27/22 1130  Temp    Pulse 75 05/27/22 1136  Resp 15 05/27/22 1136  SpO2 99 % 05/27/22 1136  Vitals shown include unvalidated device data.  Last Pain:  Vitals:   05/27/22 0941  TempSrc: Oral  PainSc: 0-No pain      Patients Stated Pain Goal: 6 (38/[id number]/54 [id number])  Complications: There were no known notable events for this encounter.

## 2022-05-27 NOTE — Interval H&P Note (Signed)
History and Physical Interval Note:  05/27/2022 9:19 AM  Olivia Woodward  has presented today for surgery, with the diagnosis of CHOLELITHIASIS.  The various methods of treatment have been discussed with the patient and family. After consideration of risks, benefits and other options for treatment, the patient has consented to  Procedure(s): LAPAROSCOPIC CHOLECYSTECTOMY (N/A) as a surgical intervention.  The patient's history has been reviewed, patient examined, no change in status, stable for surgery.  I have reviewed the patient's chart and labs.  Questions were answered to the patient's satisfaction.     Aviva Signs

## 2022-05-28 ENCOUNTER — Encounter (HOSPITAL_COMMUNITY): Payer: Self-pay | Admitting: General Surgery

## 2022-05-28 LAB — SURGICAL PATHOLOGY

## 2022-05-29 ENCOUNTER — Telehealth: Payer: Self-pay | Admitting: *Deleted

## 2022-05-29 ENCOUNTER — Other Ambulatory Visit: Payer: Self-pay | Admitting: Family Medicine

## 2022-05-29 NOTE — Telephone Encounter (Signed)
Received call from patient [phone]~ telephone.   Surgical Date: 05/27/2022 Procedure: Lap Chole  Patient reports increased epigastric pain. Reports sharp pain under breasts that worsens with deep breaths.   Denies S/Sx of infection. Denies drainage from incisions. Denies fever/ chills/ nausea. Patient is passing gas and having normal BM.  Reports that she is taking Hydrocodone/APAP as directed. Advised to add IBU '600mg'$  QID. Advised to increase activity/ walking as this is most likely collection of gas. Patient does state that she has not been walking.   Advised if Sx worsen or continue, contact office for further recommendations.

## 2022-05-29 NOTE — Addendum Note (Signed)
Addendum  created 05/29/22 0555 by Vista Deck, CRNA   Intraprocedure Event edited

## 2022-05-30 ENCOUNTER — Encounter (HOSPITAL_COMMUNITY): Payer: Self-pay

## 2022-05-30 ENCOUNTER — Other Ambulatory Visit: Payer: Self-pay

## 2022-05-30 ENCOUNTER — Emergency Department (HOSPITAL_COMMUNITY): Payer: Managed Care, Other (non HMO)

## 2022-05-30 ENCOUNTER — Emergency Department (HOSPITAL_COMMUNITY)
Admission: EM | Admit: 2022-05-30 | Discharge: 2022-05-30 | Disposition: A | Discharge status: Home / Self Care | Payer: Managed Care, Other (non HMO) | Attending: Emergency Medicine | Admitting: Emergency Medicine

## 2022-05-30 DIAGNOSIS — R1011 Right upper quadrant pain: Secondary | ICD-10-CM | POA: Insufficient documentation

## 2022-05-30 DIAGNOSIS — G8918 Other acute postprocedural pain: Secondary | ICD-10-CM | POA: Insufficient documentation

## 2022-05-30 DIAGNOSIS — R0602 Shortness of breath: Secondary | ICD-10-CM | POA: Insufficient documentation

## 2022-05-30 DIAGNOSIS — R1084 Generalized abdominal pain: Secondary | ICD-10-CM | POA: Diagnosis not present

## 2022-05-30 DIAGNOSIS — J90 Pleural effusion, not elsewhere classified: Secondary | ICD-10-CM

## 2022-05-30 LAB — COMPREHENSIVE METABOLIC PANEL
ALT: 29 U/L (ref 0–44)
AST: 46 U/L — ABNORMAL HIGH (ref 15–41)
Albumin: 3.5 g/dL (ref 3.5–5.0)
Alkaline Phosphatase: 36 U/L — ABNORMAL LOW (ref 38–126)
Anion gap: 5 (ref 5–15)
BUN: 10 mg/dL (ref 6–20)
CO2: 30 mmol/L (ref 22–32)
Calcium: 8.7 mg/dL — ABNORMAL LOW (ref 8.9–10.3)
Chloride: 104 mmol/L (ref 98–111)
Creatinine, Ser: 1.08 mg/dL — ABNORMAL HIGH (ref 0.44–1.00)
GFR, Estimated: >60 mL/min (ref >60)
Glucose, Bld: 90 mg/dL (ref 70–99)
Potassium: 3.7 mmol/L (ref 3.5–5.1)
Sodium: 139 mmol/L (ref 135–145)
Total Bilirubin: 1.7 mg/dL — ABNORMAL HIGH (ref 0.3–1.2)
Total Protein: 7.2 g/dL (ref 6.5–8.1)

## 2022-05-30 LAB — CBC WITH DIFFERENTIAL/PLATELET
Abs Immature Granulocytes: 0.02 10*3/uL (ref 0.00–0.07)
Basophils Absolute: 0 10*3/uL (ref 0.0–0.1)
Basophils Relative: 0 %
Eosinophils Absolute: 0.2 10*3/uL (ref 0.0–0.5)
Eosinophils Relative: 2 %
HCT: 42.4 % (ref 36.0–46.0)
Hemoglobin: 13.2 g/dL (ref 12.0–15.0)
Immature Granulocytes: 0 %
Lymphocytes Relative: 26 %
Lymphs Abs: 1.6 10*3/uL (ref 0.7–4.0)
MCH: 32.3 pg (ref 26.0–34.0)
MCHC: 31.1 g/dL (ref 30.0–36.0)
MCV: 103.7 fL — ABNORMAL HIGH (ref 80.0–100.0)
Monocytes Absolute: 0.6 10*3/uL (ref 0.1–1.0)
Monocytes Relative: 10 %
Neutro Abs: 3.8 10*3/uL (ref 1.7–7.7)
Neutrophils Relative %: 62 %
Platelets: 285 10*3/uL (ref 150–400)
RBC: 4.09 MIL/uL (ref 3.87–5.11)
RDW: 12.5 % (ref 11.5–15.5)
WBC: 6.2 10*3/uL (ref 4.0–10.5)
nRBC: 0 % (ref 0.0–0.2)

## 2022-05-30 LAB — TROPONIN I (HIGH SENSITIVITY)
Troponin I (High Sensitivity): 3 ng/L (ref <18)
Troponin I (High Sensitivity): 3 ng/L (ref <18)

## 2022-05-30 LAB — LIPASE, BLOOD: Lipase: 21 U/L (ref 11–51)

## 2022-05-30 LAB — BRAIN NATRIURETIC PEPTIDE: B Natriuretic Peptide: 32 pg/mL (ref 0.0–100.0)

## 2022-05-30 MED ORDER — MORPHINE SULFATE (PF) 4 MG/ML IV SOLN
4.0000 mg | Freq: Once | INTRAVENOUS | Status: AC
  Administered 2022-05-30: 4 mg via INTRAVENOUS
  Filled 2022-05-30: qty 1

## 2022-05-30 MED ORDER — HYDROCODONE-ACETAMINOPHEN 5-325 MG PO TABS
2.0000 | ORAL_TABLET | Freq: Once | ORAL | Status: AC
  Administered 2022-05-30: 2 via ORAL
  Filled 2022-05-30: qty 2

## 2022-05-30 MED ORDER — ONDANSETRON HCL 4 MG/2ML IJ SOLN
4.0000 mg | Freq: Once | INTRAMUSCULAR | Status: AC
Start: 2022-05-30 — End: 2022-05-30
  Administered 2022-05-30: 4 mg via INTRAVENOUS
  Filled 2022-05-30: qty 2

## 2022-05-30 MED ORDER — SODIUM CHLORIDE 0.9 % IV BOLUS
500.0000 mL | Freq: Once | INTRAVENOUS | Status: AC
  Administered 2022-05-30: 500 mL via INTRAVENOUS

## 2022-05-30 MED ORDER — IOHEXOL 350 MG/ML SOLN
80.0000 mL | Freq: Once | INTRAVENOUS | Status: AC | PRN
  Administered 2022-05-30: 100 mL via INTRAVENOUS

## 2022-05-30 MED ORDER — GADOBUTROL 1 MMOL/ML IV SOLN
10.0000 mL | Freq: Once | INTRAVENOUS | Status: AC | PRN
  Administered 2022-05-30: 10 mL via INTRAVENOUS

## 2022-05-30 NOTE — Telephone Encounter (Signed)
Received call from patient.   Reports that she is having increased episodes of sharp pain in epigastric region and shortness of breath.   Advised to return to ER for evaluation.   On Call provider Dr. Okey Dupre made aware as Dr. Arnoldo Morale is on PAL.

## 2022-05-30 NOTE — Discharge Instructions (Addendum)
Follow-up with Dr. Arnoldo Morale for your postoperative pain.  Your CT scan and MRI did show some mild fluid in the lungs, follow-up with Dr. Moshe Cipro.  There were also some mildly enlarged lymph nodes in your abdomen and these will need repeat CT scan in 3 months.  Dr. Moshe Cipro help arrange this.  If you develop fever, new or worsening pain, vomiting, trouble breathing, coughing up blood, or any other new/concerning symptoms then return to the ER for evaluation.

## 2022-05-30 NOTE — ED Notes (Signed)
Pt called RN in room requesting her IV needed out or she was taking it out herself; IV removed

## 2022-05-30 NOTE — ED Triage Notes (Signed)
Pt presents to ED with sharp pain on right side from stomach to back since gallbladder surgery on Monday. Pt denies N/V/D

## 2022-05-30 NOTE — ED Provider Notes (Signed)
[facility] EMERGENCY DEPARTMENT Provider Note   CSN: [id number] Arrival date & time: 05/30/22  1039     History  Chief Complaint  Patient presents with   Abdominal Pain    Olivia Woodward is a 56 y.o. female.  She is status post lap [place] by Dr. Arnoldo Morale 7/10.  She was discharged on hydrocodone.  She said she has been experiencing sharp right upper quadrant abdominal pain radiating through to her back that started after she turned in bed and felt a pop.  She feels the pain is like labor, sharp and stabbing, severe.  It is associated with some shortness of breath.  No cough or fever.  Decreased appetite.  Moved her bowels once no urinary symptoms.  She called the office yesterday and was recommended that she take some ibuprofen and ambulate more.  She called again today as the pain was more severe and they recommended she come to the emergency department.  No prior history of PE DVT.  The history is provided by the patient.  Abdominal Pain Pain location:  RUQ Pain quality: sharp and stabbing   Pain radiates to:  Back Pain severity:  Severe Onset quality:  Sudden Duration:  3 days Timing:  Intermittent Progression:  Worsening Chronicity:  New Context: previous surgery   Relieved by:  Nothing Worsened by:  Deep breathing, movement and palpation Ineffective treatments:  NSAIDs (Hydrocodone) Associated symptoms: anorexia, nausea and shortness of breath   Associated symptoms: no chest pain, no constipation, no cough, no diarrhea, no dysuria, no fever and no sore throat        Home Medications Prior to Admission medications   Medication Sig Start Date End Date Taking? Authorizing Provider  ALPRAZolam (XANAX) 0.25 MG tablet Take 1 tablet (0.25 mg total) by mouth 2 (two) times daily as needed for anxiety. 04/25/22 06/24/22  Norman Clay, MD  amLODipine (NORVASC) 10 MG tablet Take 1 tablet by mouth once daily 05/30/22   Fayrene Helper, MD  cariprazine (VRAYLAR) 1.5 MG capsule Take 1  capsule (1.5 mg total) by mouth daily. 06/02/22 08/31/22  Norman Clay, MD  Cyanocobalamin (B-12) 2500 MCG TABS Take 2,500 mcg by mouth daily.    [provider]  Cyanocobalamin (VITAMIN B12) 1000 MCG TBCR Take 2 tablets by mouth once daily Patient not taking: Reported on 05/14/2022 04/17/22   Fayrene Helper, MD  ergocalciferol (VITAMIN D2) 1.25 MG (50000 UT) capsule Take 1 capsule (50,000 Units total) by mouth once a week. One capsule once weekly 04/17/22   Fayrene Helper, MD  escitalopram (LEXAPRO) 20 MG tablet Take 1 tablet (20 mg total) by mouth daily. 05/27/22 08/25/22  Norman Clay, MD  Eszopiclone 3 MG TABS Take 1 tablet (3 mg total) by mouth at bedtime as needed. Take immediately before bedtime 04/13/22 07/12/22  Norman Clay, MD  HYDROcodone-acetaminophen (NORCO) 5-325 MG tablet Take 1 tablet by mouth every 6 (six) hours as needed for moderate pain. 05/27/22   Aviva Signs, MD  lamoTRIgine (LAMICTAL) 25 MG tablet Week 1 25 mg bid po, Week 2- 25 mg in AM and 50 mg in PM, Week 3- 50 mg bid po, Week 4- 75 mg bid po, Week 5 100 mg bid po Patient taking differently: Take 100 mg by mouth 2 (two) times daily. 03/25/22   Dohmeier, Asencion Partridge, MD  ondansetron [facility]) 4 MG tablet Take one tablet by mouth once daily, as needed, for nausea 04/16/22   Fayrene Helper, MD  pantoprazole ([place])  20 MG tablet Take 1 tablet (20 mg total) by mouth daily. 04/16/22   Fayrene Helper, MD  rizatriptan (MAXALT-MLT) 10 MG disintegrating tablet Take 1 tablet (10 mg total) by mouth as needed for migraine. May repeat in 2 hours if needed 04/16/22   Fayrene Helper, MD  spironolactone (ALDACTONE) 100 MG tablet Take 1 tablet by mouth once daily 05/24/22   Fayrene Helper, MD      Allergies    Ketorolac tromethamine    Review of Systems   Review of Systems  Constitutional:  Negative for fever.  HENT:  Negative for sore throat.   Eyes:  Negative for visual disturbance.  Respiratory:  Positive  for shortness of breath. Negative for cough.   Cardiovascular:  Negative for chest pain.  Gastrointestinal:  Positive for abdominal pain, anorexia and nausea. Negative for constipation and diarrhea.  Genitourinary:  Negative for dysuria.  Musculoskeletal:  Positive for back pain.  Skin:  Negative for rash.    Physical Exam Updated Vital Signs BP 126/81   Pulse 85   Temp 98.4 F (36.9 C) (Oral)   Resp 18   Ht '5\' 6"'$  (1.676 m)   Wt 106.6 kg   SpO2 92%   BMI 37.93 kg/m  Physical Exam Vitals and nursing note reviewed.  Constitutional:      General: She is not in acute distress.    Appearance: Normal appearance. She is well-developed. She is obese.  HENT:     Head: Normocephalic and atraumatic.  Eyes:     Conjunctiva/sclera: Conjunctivae normal.  Cardiovascular:     Rate and Rhythm: Normal rate and regular rhythm.     Heart sounds: No murmur heard. Pulmonary:     Effort: Pulmonary effort is normal. No respiratory distress.     Breath sounds: Normal breath sounds.  Abdominal:     Palpations: Abdomen is soft.     Tenderness: There is generalized abdominal tenderness and tenderness in the right upper quadrant. There is no guarding or rebound.  Musculoskeletal:        General: No tenderness.     Cervical back: Neck supple.     Right lower leg: No edema.     Left lower leg: No edema.  Skin:    General: Skin is warm and dry.     Capillary Refill: Capillary refill takes less than 2 seconds.  Neurological:     General: No focal deficit present.     Mental Status: She is alert.     ED Results / Procedures / Treatments   Labs (all labs ordered are listed, but only abnormal results are displayed) Labs Reviewed  COMPREHENSIVE METABOLIC PANEL - Abnormal; Notable for the following components:      Result Value   Creatinine, Ser 1.08 (*)    Calcium 8.7 (*)    AST 46 (*)    Alkaline Phosphatase 36 (*)    Total Bilirubin 1.7 (*)    All other components within normal limits   CBC WITH DIFFERENTIAL/PLATELET - Abnormal; Notable for the following components:   MCV 103.7 (*)    All other components within normal limits  LIPASE, BLOOD  BRAIN NATRIURETIC PEPTIDE  TROPONIN I (HIGH SENSITIVITY)  TROPONIN I (HIGH SENSITIVITY)    EKG EKG Interpretation  Date/Time:  Thursday May 30 2022 11:53:23 EDT Ventricular Rate:  83 PR Interval:  162 QRS Duration: 96 QT Interval:  416 QTC Calculation: 489 R Axis:   7 Text Interpretation: Sinus rhythm Abnormal  R-wave progression, early transition Borderline prolonged QT interval No significant change since prior 11/19 Confirmed by Aletta Edouard [phone]) on 05/30/2022 11:55:54 AM  Radiology MR ABDOMEN MRCP W WO CONTAST  Result Date: 05/30/2022 CLINICAL DATA:  Right upper quadrant abdominal pain status post lap coli elevated LFTs. EXAM: MRI ABDOMEN WITHOUT AND WITH CONTRAST (INCLUDING MRCP) TECHNIQUE: Multiplanar multisequence MR imaging of the abdomen was performed both before and after the administration of intravenous contrast. Heavily T2-weighted images of the biliary and pancreatic ducts were obtained, and three-dimensional MRCP images were rendered by post processing. CONTRAST:  21m GADAVIST GADOBUTROL 1 MMOL/ML IV SOLN COMPARISON:  CT abdomen pelvis May 30, 2022. FINDINGS: Lower chest: Small right pleural effusion with adjacent airspace consolidation. Tiny left pleural effusion with adjacent airspace consolidation. Hepatobiliary: No significant hepatic steatosis. No suspicious hepatic lesion. Mild periportal edema. Gallbladder surgically absent. Focal fluid and gas in the gallbladder fossa measures approximally 4.6 x 2.2 cm. Mild prominence of the biliary tree with the common duct measuring up to 7 mm in maximum diameter but with gentle tapering to the level of the ampulla and no choledocholithiasis identified. Pancreas: No pancreatic ductal dilation or evidence of acute inflammation. No suspicious pancreatic mass. 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 No splenomegaly. Adrenals/Urinary Tract: Enhancing 1.7 cm right adrenal lesion demonstrates loss of signal on out of phase imaging consistent with a benign adrenal adenoma, stable dating back to at least 2021 and requiring no independent imaging follow-up. Left adrenal gland appears normal. Large left renal calculus with findings of left lower pole segmental renal XGP. Bilateral fluid signal renal lesions measure up to 3.5 cm in the left kidney and do not demonstrate suspicious postcontrast enhancement consistent with benign renal cysts which do not require independent imaging follow-up. Stomach/Bowel: Prior Roux-en-Y gastric bypass. Mild wall thickening of the excluded stomach is similar prior. No pathologic dilation of large or small bowel in the abdomen. Vascular/Lymphatic: Normal caliber abdominal aorta. Prominent retroperitoneal lymph nodes measure up to 12 mm in the left periaortic station. Other:  Anterior abdominal wall fluid/stranding. Musculoskeletal: No suspicious bone lesions identified. IMPRESSION: 1. Surgical change of recent cholecystectomy with T1/T2 hyperintense material/fluid and gas in the gallbladder fossa, favored to reflect postoperative change/Surgicel without drainable fluid collection/abscess. 2. Prominence of the biliary tree with the common duct measuring up to 7 mm gentle tapering to the level of the ampulla without choledocholithiasis or evidence of biliary injury. 3. Periportal edema is nonspecific but most commonly reflects either sequela of aggressive IV hydration or hepatitis. 4. Right-greater-than-left pleural effusions with bibasilar airspace disease reflecting atelectasis or infiltrate. 5. Probable postsurgical fluid in the anterior abdominal wall recommend correlation with direct visualization for cellulitis. 6. Prominent retroperitoneal lymph nodes possibly reactive. Consider follow-up CT  abdomen pelvis in 3 months to assess stability/resolution. 7. 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 Electronically Signed   By: JDahlia BailiffM.D.   On: 05/30/2022 17:43   MR 3D Recon At Scanner  Result Date: 05/30/2022 CLINICAL DATA:  Right upper quadrant abdominal pain status post lap coli elevated LFTs. EXAM: MRI ABDOMEN WITHOUT AND WITH CONTRAST (INCLUDING MRCP) TECHNIQUE: Multiplanar multisequence MR imaging of the abdomen was performed both before and after the administration of intravenous contrast. Heavily T2-weighted images of the biliary and pancreatic ducts were obtained, and three-dimensional MRCP images were rendered by post processing. CONTRAST:  144mGADAVIST GADOBUTROL  1 MMOL/ML IV SOLN COMPARISON:  CT abdomen pelvis May 30, 2022. FINDINGS: Lower chest: Small right pleural effusion with adjacent airspace consolidation. Tiny left pleural effusion with adjacent airspace consolidation. Hepatobiliary: No significant hepatic steatosis. No suspicious hepatic lesion. Mild periportal edema. Gallbladder surgically absent. Focal fluid and gas in the gallbladder fossa measures approximally 4.6 x 2.2 cm. Mild prominence of the biliary tree with the common duct measuring up to 7 mm in maximum diameter but with gentle tapering to the level of the ampulla and no choledocholithiasis identified. Pancreas: No pancreatic ductal dilation or evidence of acute inflammation. No suspicious pancreatic mass. 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 No splenomegaly. Adrenals/Urinary Tract: Enhancing 1.7 cm right adrenal lesion demonstrates loss of signal on out of phase imaging consistent with a benign adrenal adenoma, stable dating back to at least 2021 and requiring no independent imaging follow-up. Left adrenal gland appears normal. Large left renal calculus with findings of left lower pole  segmental renal XGP. Bilateral fluid signal renal lesions measure up to 3.5 cm in the left kidney and do not demonstrate suspicious postcontrast enhancement consistent with benign renal cysts which do not require independent imaging follow-up. Stomach/Bowel: Prior Roux-en-Y gastric bypass. Mild wall thickening of the excluded stomach is similar prior. No pathologic dilation of large or small bowel in the abdomen. Vascular/Lymphatic: Normal caliber abdominal aorta. Prominent retroperitoneal lymph nodes measure up to 12 mm in the left periaortic station. Other:  Anterior abdominal wall fluid/stranding. Musculoskeletal: No suspicious bone lesions identified. IMPRESSION: 1. Surgical change of recent cholecystectomy with T1/T2 hyperintense material/fluid and gas in the gallbladder fossa, favored to reflect postoperative change/Surgicel without drainable fluid collection/abscess. 2. Prominence of the biliary tree with the common duct measuring up to 7 mm gentle tapering to the level of the ampulla without choledocholithiasis or evidence of biliary injury. 3. Periportal edema is nonspecific but most commonly reflects either sequela of aggressive IV hydration or hepatitis. 4. Right-greater-than-left pleural effusions with bibasilar airspace disease reflecting atelectasis or infiltrate. 5. Probable postsurgical fluid in the anterior abdominal wall recommend correlation with direct visualization for cellulitis. 6. Prominent retroperitoneal lymph nodes possibly reactive. Consider follow-up CT abdomen pelvis in 3 months to assess stability/resolution. 7. 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 Electronically Signed   By: Dahlia Bailiff M.D.   On: 05/30/2022 17:43   CT Angio Chest PE W/Cm &/Or Wo Cm  Addendum Date: 05/30/2022   ADDENDUM REPORT: 05/30/2022 15:22 ADDENDUM: These results were called by telephone at the time of interpretation on  05/30/2022 at 3:22 pm to provider [facility] , who verbally acknowledged these results. Electronically Signed   By: Zetta Bills M.D.   On: 05/30/2022 15:22   Result Date: 05/30/2022 CLINICAL DATA:  Pulmonary embolism suspected in a 56 year old female also with abdominal pain post cholecystectomy. EXAM: CT ANGIOGRAPHY CHEST CT ABDOMEN AND PELVIS WITH CONTRAST TECHNIQUE: Multidetector CT imaging of the chest was performed using the standard protocol during bolus administration of intravenous contrast. Multiplanar CT image reconstructions and MIPs were obtained to evaluate the vascular anatomy. Multidetector CT imaging of the abdomen and pelvis was performed using the standard protocol during bolus administration of intravenous contrast. RADIATION DOSE REDUCTION: This exam was performed according to the departmental dose-optimization program which includes automated exposure control, adjustment of the mA and/or kV according to patient size and/or use of iterative reconstruction technique. CONTRAST:  122m OMNIPAQUE IOHEXOL 350 MG/ML SOLN COMPARISON:  Imaging from October 10, 2020. FINDINGS: CTA CHEST FINDINGS Cardiovascular:  Aortic caliber is normal with bovine arch. Heart size is top normal without pericardial effusion or sign of pericardial nodularity. Central pulmonary arteries are opacified to 232 Hounsfield units adequate for assessment of the segmental level on today's study. Accounting for that limitation there is no evidence of pulmonary embolism. Mediastinum/Nodes: Mildly patulous esophagus similar to prior imaging. No stranding adjacent to the esophagus. Bilateral axillary lymph nodes, mildly prominent less than a cm and unchanged from previous imaging. LEFT thyroid lesion (image 18/5) 18 mm with generalized heterogeneity but without enlargement of the thyroid otherwise. No mediastinal adenopathy.  No hilar adenopathy. Lungs/Pleura: Volume loss with low lung volumes. Small RIGHT-sided pleural  effusion. Airspace disease greatest at the RIGHT lung base and RIGHT hemidiaphragm elevated greater than LEFT. Lung volumes are similar to previous imaging. Airspace disease is increased compared to prior imaging. Musculoskeletal: No chest wall lesion. No acute bone finding. No acute bone finding or destructive bone process about the bony thorax. Review of the MIP images confirms the above findings. CT ABDOMEN and PELVIS FINDINGS Hepatobiliary: Post cholecystectomy. Low attenuation in the gallbladder fossa a mixed with gas measuring 3.7 x 2.0 cm. Signs of intrahepatic biliary duct distension. Mild extrahepatic biliary duct distension. Common bile duct was nondilated on previous imaging. Lobular hepatic contours may be indicative of liver disease. The portal vein is patent. Pancreas: Normal, without mass, inflammation or ductal dilatation. Spleen: Normal. Adrenals/Urinary Tract: RIGHT adrenal lesion measuring 19 x 11 mm is homogeneous with density of 40 Hounsfield units. LEFT adrenal gland is normal. Low-attenuation lesions in the RIGHT kidney largest nearly a cm at 16 Hounsfield units compatible with a cyst on the RIGHT. Mild striated nephrogram. LEFT kidney with large intrarenal calculus and segmental dilation of calices with "fragmented calculus withw scattered calcific fragments extending into the lower pole collecting systems. One extends beyond the confines of the lower pole calyx. There is little functional renal parenchyma remaining overlying lower pole and anterior interpolar collecting system elements. Benign cysts are present elsewhere in the LEFT kidney. The large calculus measures 2.5 x 1.7 cm. 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 Smooth contour of the urinary bladder. No RIGHT-sided hydronephrosis. Stomach/Bowel: Edema in the excluded stomach. Signs of gastric bypass. Small bowel anastomosis in the LEFT upper quadrant following Roux-en-Y without signs of  obstruction. The appendix is normal. Colon is largely stool filled. Vascular/Lymphatic: Retroperitoneal lymph nodes with mild enlargement particularly on the LEFT (image 48/3) 12 mm scattered smaller lymph nodes elsewhere in the retroperitoneum. No frank upper abdominal lymphadenopathy. Pelvic adenopathy along the bilateral pelvic sidewall (image 84/3) 15 mm on the RIGHT and 14 mm on the LEFT. Aorta displays a normal caliber. Vascular structures in the abdomen are patent on venous phase assessment. IVC mildly engorged. RIGHT groin lymph node measuring 13 mm (image 97/3) Reproductive: Post hysterectomy without signs of adnexal mass. Other: Skin thickening across the anterior abdomen. Stranding across the anterior abdomen. Small amount of gas locules scattered about the abdomen mainly about the umbilicus and in the midline subcostal and RIGHT subcostal region. This may relate to recent laparoscopic access. The possibility of cellulitis is also considered. No discrete fluid collection in the anterior abdominal wall at this time. No pneumoperitoneum. No free pelvic fluid. Musculoskeletal: Changes in the body wall discussed above. No acute or destructive bone process. Review of the MIP images confirms  the above findings. IMPRESSION: 1. No signs of pulmonary embolism to the segmental level. 2. Elevated RIGHT and LEFT hemidiaphragm RIGHT greater than LEFT with RIGHT-sided effusion and basilar airspace disease. Patchy atelectasis in the LEFT chest. 3. Low attenuation in the gallbladder fossa a mixed with gas, this likely reflects postoperative change given that Surgicel was placed in the gallbladder fossa during surgery. 4. Intrahepatic and extrahepatic biliary duct distension is mild-to-moderate not well evaluated on previous imaging but nondilated preoperatively. The possibility of choledocholithiasis is considered. Biliary injury could also potentially present with dilated biliary tree but there is no discrete transition  point in the entire biliary tree is dilated on today's study. 5. Hepatic fissural widening and posterior hepatic notching, morphology suggest liver disease. 6. Gastric thickening similar to prior imaging in the excluded stomach could indicate gastritis but is unchanged. 7. RIGHT adrenal lesion measuring 19 x 11 mm is stable since 3151 and almost certainly a benign adenoma. Consider 1 year follow-up with adrenal protocol CT. 8. 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 No current substantial surrounding stranding. Consider correlation with urinalysis as this pattern of disease is associated with urinary tract infection. There is also mild striated nephrogram on the RIGHT. 9. Skin thickening across the anterior abdominal wall with small amounts of gas may be postoperative but would correlate with signs of cellulitis. 10. Bilateral pelvic sidewall lymph nodes with enlargement and LEFT para-aortic lymph nodes with enlargement, these could be reactive though the position of pelvic sidewall lymph nodes is unusual in that there is no sign currently of pelvic inflammation. In the absence of history of malignancy or clinical signs of lymphoproliferative disorder would suggest comparison with prior imaging and or 3 month follow-up. Electronically Signed: By: Zetta Bills M.D. On: 05/30/2022 15:16   CT Abdomen Pelvis W Contrast  Addendum Date: 05/30/2022   ADDENDUM REPORT: 05/30/2022 15:22 ADDENDUM: These results were called by telephone at the time of interpretation on 05/30/2022 at 3:22 pm to provider [facility] , who verbally acknowledged these results. Electronically Signed   By: Zetta Bills M.D.   On: 05/30/2022 15:22   Result Date: 05/30/2022 CLINICAL DATA:  Pulmonary embolism suspected in a 56 year old female also with abdominal pain post cholecystectomy. EXAM: CT ANGIOGRAPHY  CHEST CT ABDOMEN AND PELVIS WITH CONTRAST TECHNIQUE: Multidetector CT imaging of the chest was performed using the standard protocol during bolus administration of intravenous contrast. Multiplanar CT image reconstructions and MIPs were obtained to evaluate the vascular anatomy. Multidetector CT imaging of the abdomen and pelvis was performed using the standard protocol during bolus administration of intravenous contrast. RADIATION DOSE REDUCTION: This exam was performed according to the departmental dose-optimization program which includes automated exposure control, adjustment of the mA and/or kV according to patient size and/or use of iterative reconstruction technique. CONTRAST:  157m OMNIPAQUE IOHEXOL 350 MG/ML SOLN COMPARISON:  Imaging from October 10, 2020. FINDINGS: CTA CHEST FINDINGS Cardiovascular:  Aortic caliber is normal with bovine arch. Heart size is top normal without pericardial effusion or sign of pericardial nodularity. Central pulmonary arteries are opacified to 232 Hounsfield units adequate for assessment of the segmental level on today's study. Accounting for that limitation there is no evidence of pulmonary embolism. Mediastinum/Nodes: Mildly patulous esophagus similar to prior imaging. No stranding adjacent to the esophagus. Bilateral axillary lymph nodes, mildly prominent less than a cm and unchanged from previous imaging. LEFT  thyroid lesion (image 18/5) 18 mm with generalized heterogeneity but without enlargement of the thyroid otherwise. No mediastinal adenopathy.  No hilar adenopathy. Lungs/Pleura: Volume loss with low lung volumes. Small RIGHT-sided pleural effusion. Airspace disease greatest at the RIGHT lung base and RIGHT hemidiaphragm elevated greater than LEFT. Lung volumes are similar to previous imaging. Airspace disease is increased compared to prior imaging. Musculoskeletal: No chest wall lesion. No acute bone finding. No acute bone finding or destructive bone process about  the bony thorax. Review of the MIP images confirms the above findings. CT ABDOMEN and PELVIS FINDINGS Hepatobiliary: Post cholecystectomy. Low attenuation in the gallbladder fossa a mixed with gas measuring 3.7 x 2.0 cm. Signs of intrahepatic biliary duct distension. Mild extrahepatic biliary duct distension. Common bile duct was nondilated on previous imaging. Lobular hepatic contours may be indicative of liver disease. The portal vein is patent. Pancreas: Normal, without mass, inflammation or ductal dilatation. Spleen: Normal. Adrenals/Urinary Tract: RIGHT adrenal lesion measuring 19 x 11 mm is homogeneous with density of 40 Hounsfield units. LEFT adrenal gland is normal. Low-attenuation lesions in the RIGHT kidney largest nearly a cm at 16 Hounsfield units compatible with a cyst on the RIGHT. Mild striated nephrogram. LEFT kidney with large intrarenal calculus and segmental dilation of calices with "fragmented calculus withw scattered calcific fragments extending into the lower pole collecting systems. One extends beyond the confines of the lower pole calyx. There is little functional renal parenchyma remaining overlying lower pole and anterior interpolar collecting system elements. Benign cysts are present elsewhere in the LEFT kidney. The large calculus measures 2.5 x 1.7 cm. 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 Smooth contour of the urinary bladder. No RIGHT-sided hydronephrosis. Stomach/Bowel: Edema in the excluded stomach. 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 The appendix is normal. Colon is largely stool filled. Vascular/Lymphatic: Retroperitoneal lymph nodes with mild enlargement particularly on the LEFT (image 48/3) 12 mm scattered smaller lymph nodes elsewhere in the retroperitoneum. No frank upper abdominal lymphadenopathy. Pelvic adenopathy along the bilateral pelvic  sidewall (image 84/3) 15 mm on the RIGHT and 14 mm on the LEFT. Aorta displays a normal caliber. Vascular structures in the abdomen are patent on venous phase assessment. IVC mildly engorged. RIGHT groin lymph node measuring 13 mm (image 97/3) Reproductive: Post hysterectomy without signs of adnexal mass. Other: Skin thickening across the anterior abdomen. Stranding across the anterior abdomen. Small amount of gas locules scattered about the abdomen mainly about the umbilicus and in the midline subcostal and RIGHT subcostal region. This may relate to recent laparoscopic access. The possibility of cellulitis is also considered. No discrete fluid collection in the anterior abdominal wall at this time. No pneumoperitoneum. No free pelvic fluid. Musculoskeletal: Changes in the body wall discussed above. No acute or destructive bone process. Review of the MIP images confirms the above findings. IMPRESSION: 1. No signs of pulmonary embolism to the segmental level. 2. Elevated RIGHT and LEFT hemidiaphragm RIGHT greater than LEFT with RIGHT-sided effusion and basilar airspace disease. Patchy atelectasis in the LEFT chest. 3. Low attenuation in the gallbladder fossa a mixed with gas, this likely reflects postoperative change given that Surgicel was placed in the gallbladder fossa during surgery. 4. Intrahepatic and extrahepatic biliary duct distension is mild-to-moderate not well evaluated on previous imaging but nondilated preoperatively. The possibility of choledocholithiasis is considered. Biliary injury could also potentially present with dilated biliary tree but  there is no discrete transition point in the entire biliary tree is dilated on today's study. 5. Hepatic fissural widening and posterior hepatic notching, morphology suggest liver disease. 6. Gastric thickening similar to prior imaging in the excluded stomach could indicate gastritis but is unchanged. 7. RIGHT adrenal lesion measuring 19 x 11 mm is stable since  3220 and almost certainly a benign adenoma. Consider 1 year follow-up with adrenal protocol CT. 8. 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 No current substantial surrounding stranding. Consider correlation with urinalysis as this pattern of disease is associated with urinary tract infection. There is also mild striated nephrogram on the RIGHT. 9. Skin thickening across the anterior abdominal wall with small amounts of gas may be postoperative but would correlate with signs of cellulitis. 10. Bilateral pelvic sidewall lymph nodes with enlargement and LEFT para-aortic lymph nodes with enlargement, these could be reactive though the position of pelvic sidewall lymph nodes is unusual in that there is no sign currently of pelvic inflammation. In the absence of history of malignancy or clinical signs of lymphoproliferative disorder would suggest comparison with prior imaging and or 3 month follow-up. Electronically Signed: By: Zetta Bills M.D. On: 05/30/2022 15:16   DG Chest Port 1 View  Result Date: 05/30/2022 CLINICAL DATA:  Severe UPPER abdominal pain. EXAM: PORTABLE CHEST 1 VIEW COMPARISON:  10/04/2020 FINDINGS: Low lung volumes. There is dense opacity in the MEDIAL aspects both LOWER lobes. No evidence for pulmonary edema. IMPRESSION: 1. Low lung volumes. 2. Consolidations or infiltrates in the MEDIAL aspects of the LOWER lobes bilaterally. Electronically Signed   By: Nolon Nations M.D.   On: 05/30/2022 12:15    Procedures Procedures    Medications Ordered in ED Medications  HYDROcodone-acetaminophen (NORCO/VICODIN) 5-325 MG per tablet 2 tablet (has no administration in time range)  sodium chloride 0.9 % bolus 500 mL (0 mLs Intravenous Stopped 05/30/22 1356)  ondansetron (ZOFRAN) injection 4 mg (4 mg Intravenous Given 05/30/22 1204)  morphine (PF) 4 MG/ML injection 4  mg (4 mg Intravenous Given 05/30/22 1204)  iohexol (OMNIPAQUE) 350 MG/ML injection 80 mL (100 mLs Intravenous Contrast Given 05/30/22 1421)  gadobutrol (GADAVIST) 1 MMOL/ML injection 10 mL (10 mLs Intravenous Contrast Given 05/30/22 1708)    ED Course/ Medical Decision Making/ A&P Clinical Course as of 05/30/22 1842  Thu May 30, 2022  1208 Chest x-ray interpreted by me as poor inspiration no gross infiltrates.  Awaiting radiology reading. [MB]  1220 Radiology read the x-ray as consolidation of bilateral lower lobes. [MB]  26 Consulted general surgery Dr. Okey Dupre covering Dr. Arnoldo Morale.  She will review imaging and get back to me with recommendations. [MB]  2542 General surgery is recommending MRCP to evaluate if retained stone or possible malposition clip.  We will sign her care out to oncoming provider to follow-up on results of this and further general surgery recommendations. [MB]    Clinical Course User Index [MB] Hayden Rasmussen, MD                           Medical Decision Making Amount and/or Complexity of Data Reviewed Labs: ordered. Radiology: ordered.  Risk Prescription drug management.  This patient complains of upper abdominal pain, shortness of breath,; this involves an extensive number of treatment Options and is a complaint that carries with it a high risk of  complications and morbidity. The differential includes postoperative pain, pleurisy, PE, pneumothorax, intra-abdominal infection, retained stone  I ordered, reviewed and interpreted labs, which included CBC with normal white count normal hemoglobin, chemistries normal, LFTs mildly elevated, troponin and BMP unremarkable I ordered medication IV fluids nausea medication pain medication with improvement in her symptoms and reviewed PMP when indicated. I ordered imaging studies which included chest x-ray, CT abdomen, CT angio chest, MRCP and I independently    visualized and interpreted imaging which showed  atelectasis versus infiltrate bilateral bases, ductal dilatation, other incidental findings Additional history obtained from patient's mother Previous records obtained and reviewed in epic including discharge summary general surgery I consulted general surgery and discussed lab and imaging findings and discussed disposition.  Cardiac monitoring reviewed, normal sinus rhythm Social determinants considered, no significant barriers Critical Interventions: None  After the interventions stated above, I reevaluated the patient and found patient's pain to be improved Admission and further testing considered, she is pending MRCP.  Her care is signed out to Dr. Regenia Skeeter to follow-up on results of MRCP and review with general surgery.  Disposition per results of testing.          Final Clinical Impression(s) / ED Diagnoses Final diagnoses:  Post-operative pain    Rx / DC Orders ED Discharge Orders     None         Hayden Rasmussen, MD 05/30/22 1845

## 2022-05-30 NOTE — ED Notes (Signed)
Patient transported to MRI 

## 2022-05-30 NOTE — Progress Notes (Addendum)
Patient seen and examined.  She is resting comfortably in bed.  States her abdominal pain is slightly better since receiving IV pain medication.  She states that the day after her surgery, she had a popping sensation in her right upper quadrant, and after that point, she started having severe right upper quadrant abdominal pain.  She denies nausea, vomiting, fever, chills, and difficulty with bowel movements.  She has not eaten much secondary to decreased appetite.  She denies any issues at her surgical incision sites.  She called the office, who recommended her to take her PRN Norco and ibuprofen.  She states that this did not help with her pain.  She presented to the hospital this morning secondary to persistent abdominal pain.  Physical exam: Abdomen: Soft, nondistended, mild percussion tenderness in right upper quadrant, tenderness to palpation in right upper quadran; voluntary guarding; no rigidity, or rebound tenderness  In ED, she has undergone blood work which demonstrates no leukocytosis, no elevation of lipase, mild elevation of total bilirubin at 1.7, low ALP, and minimally elevated AST.  ED physician has ordered CTA of the chest and CT abdomen and pelvis.  Will await imaging to make further recommendations.  Addendum 5:59PM: CT abdomen and pelvis demonstrates intrahepatic and extrahepatic biliary duct dilation that is mild to moderate, possibility for choledocholithiasis versus possibly a biliary duct injury.  Given CT findings, MRCP was ordered.  MRCP demonstrates prominence of the biliary tree with the CBD measuring 7 mm and demonstrates gentle tapering to the level of the ampulla without choledocholithiasis or evidence of biliary injury.  Given the above imaging findings, patient is stable for discharge from a general surgery standpoint.  She should follow-up with Dr. Arnoldo Morale in 2 weeks, at which time hepatic function test will likely be repeated to verify normalization of total bilirubin.   Please call with any questions or concerns.  Graciella Freer, DO [facility] [address], [place] [address] [phone] (office)

## 2022-05-30 NOTE — ED Provider Notes (Signed)
5:59 PM MRCP has resulted. Discussed with on call general surgery. From gen-surg perspective she is cleared.   I have re-evaluated patient and discussed results. She understands to follow up with Dr. Arnoldo Morale. Knows she needs repeat CT in 3 months for lymphadenopathy. Will order hepatitis panel based on nonspecific liver findings.  Pleural effusions are of unclear etiology.  Troponins and BNP are normal.  White blood cell count is normal.  Atelectasis versus infiltrate but given her degree of pain and feeling like it hurts when she breathes I suspect this is atelectasis.  She had a cough on the first day but the cough has significantly improved.  No fever.  I doubt pneumonia.  No dyspnea except when the pain flares.  Will discharge home with return precautions.   Sherwood Gambler, MD 05/30/22 [phone]

## 2022-05-31 LAB — HEPATITIS PANEL, ACUTE
HCV Ab: NONREACTIVE
Hep A IgM: NONREACTIVE
Hep B C IgM: NONREACTIVE
Hepatitis B Surface Ag: NONREACTIVE

## 2022-06-03 ENCOUNTER — Telehealth: Payer: Self-pay | Admitting: Family Medicine

## 2022-06-03 NOTE — Telephone Encounter (Signed)
STD Paperwork completed and faxed to [facility] at [phone]. Received confirmation.  Out of work starting 05/27/2022 and may return unrestricted on 06/10/2022.   Surgery - Lap Choley ICD: K80.66

## 2022-06-04 ENCOUNTER — Other Ambulatory Visit: Payer: Self-pay | Admitting: Neurology

## 2022-06-04 ENCOUNTER — Other Ambulatory Visit: Payer: Self-pay | Admitting: Family Medicine

## 2022-06-04 MED ORDER — LAMOTRIGINE 25 MG PO TABS: 100.0000 mg | ORAL_TABLET | Freq: Two times a day (BID) | ORAL | 0 refills | Status: DC

## 2022-06-07 ENCOUNTER — Ambulatory Visit (INDEPENDENT_AMBULATORY_CARE_PROVIDER_SITE_OTHER): Payer: Managed Care, Other (non HMO) | Admitting: General Surgery

## 2022-06-07 ENCOUNTER — Encounter: Payer: Self-pay | Admitting: General Surgery

## 2022-06-07 DIAGNOSIS — Z09 Encounter for follow-up examination after completed treatment for conditions other than malignant neoplasm: Secondary | ICD-10-CM

## 2022-06-07 NOTE — Progress Notes (Signed)
Subjective:     Olivia Woodward  Postoperative telephone virtual visit performed with patient.  She states she is still having some nausea which she was having for some time preoperatively.  She still has incisional pain in the right upper quadrant.  She was seen in the emergency room and evaluated by my partner last week.  Work-up was negative for any complication from the surgery.  Patient denies any fever, chills, or jaundice.  She is more concerned that she continues to have nausea which was present from her some time preoperatively. Objective:    There were no vitals taken for this visit.  General:  Phone visit  Final pathology consistent with diagnosis.     Assessment:    Patient with mild incisional pain which seems to be resolving, though she is concerned that still present.  She also has ongoing nausea.  This may be multifactorial in nature.     Plan:   I told her that the incisional muscular pain will resolve with time.  She should continue ibuprofen as instructed.  Concerning her ongoing chronic nausea, I instructed her to follow-up with Dr. Moshe Cipro for further evaluation and treatment as this may be multifactorial in nature.  She was instructed to call me should any other problems arise.  Follow-up as needed.

## 2022-06-15 ENCOUNTER — Other Ambulatory Visit: Payer: Self-pay | Admitting: Psychiatry

## 2022-06-16 NOTE — Telephone Encounter (Signed)
I have utilized the [place] Controlled Substances Reporting System (PMP AWARxE) to confirm adherence regarding the patient's medication. My review reveals appropriate prescription fills.  

## 2022-06-19 ENCOUNTER — Ambulatory Visit: Payer: Managed Care, Other (non HMO) | Admitting: Gastroenterology

## 2022-06-21 ENCOUNTER — Encounter: Payer: Self-pay | Admitting: Nurse Practitioner

## 2022-06-21 ENCOUNTER — Other Ambulatory Visit: Payer: Self-pay | Admitting: Family Medicine

## 2022-06-21 ENCOUNTER — Ambulatory Visit: Payer: Managed Care, Other (non HMO) | Admitting: Nurse Practitioner

## 2022-06-21 ENCOUNTER — Other Ambulatory Visit: Payer: Self-pay

## 2022-06-21 ENCOUNTER — Emergency Department (HOSPITAL_COMMUNITY): Payer: Managed Care, Other (non HMO)

## 2022-06-21 ENCOUNTER — Emergency Department (HOSPITAL_COMMUNITY)
Admission: EM | Admit: 2022-06-21 | Discharge: 2022-06-21 | Disposition: A | Discharge status: Home / Self Care | Payer: Managed Care, Other (non HMO) | Attending: Emergency Medicine | Admitting: Emergency Medicine

## 2022-06-21 ENCOUNTER — Telehealth: Payer: Self-pay | Admitting: Family Medicine

## 2022-06-21 ENCOUNTER — Encounter (HOSPITAL_COMMUNITY): Payer: Self-pay | Admitting: Emergency Medicine

## 2022-06-21 DIAGNOSIS — Z79899 Other long term (current) drug therapy: Secondary | ICD-10-CM | POA: Insufficient documentation

## 2022-06-21 DIAGNOSIS — I10 Essential (primary) hypertension: Secondary | ICD-10-CM | POA: Insufficient documentation

## 2022-06-21 DIAGNOSIS — Z7901 Long term (current) use of anticoagulants: Secondary | ICD-10-CM | POA: Insufficient documentation

## 2022-06-21 DIAGNOSIS — I2693 Single subsegmental pulmonary embolism without acute cor pulmonale: Secondary | ICD-10-CM | POA: Insufficient documentation

## 2022-06-21 DIAGNOSIS — R1011 Right upper quadrant pain: Secondary | ICD-10-CM | POA: Diagnosis not present

## 2022-06-21 DIAGNOSIS — N3001 Acute cystitis with hematuria: Secondary | ICD-10-CM | POA: Diagnosis not present

## 2022-06-21 DIAGNOSIS — R079 Chest pain, unspecified: Secondary | ICD-10-CM | POA: Diagnosis present

## 2022-06-21 LAB — CBC
HCT: 58.6 % — ABNORMAL HIGH (ref 36.0–46.0)
Hemoglobin: 18.4 g/dL — ABNORMAL HIGH (ref 12.0–15.0)
MCH: 32.1 pg (ref 26.0–34.0)
MCHC: 31.4 g/dL (ref 30.0–36.0)
MCV: 102.1 fL — ABNORMAL HIGH (ref 80.0–100.0)
Platelets: 207 10*3/uL (ref 150–400)
RBC: 5.74 MIL/uL — ABNORMAL HIGH (ref 3.87–5.11)
RDW: 12.4 % (ref 11.5–15.5)
WBC: 4.9 10*3/uL (ref 4.0–10.5)
nRBC: 0 % (ref 0.0–0.2)

## 2022-06-21 LAB — URINALYSIS, ROUTINE W REFLEX MICROSCOPIC
Bilirubin Urine: NEGATIVE
Glucose, UA: NEGATIVE mg/dL
Ketones, ur: NEGATIVE mg/dL
Nitrite: NEGATIVE
Protein, ur: 100 mg/dL — AB
RBC / HPF: >50 RBC/hpf — ABNORMAL HIGH (ref 0–5)
Specific Gravity, Urine: >1.046 — ABNORMAL HIGH (ref 1.005–1.030)
WBC, UA: >50 WBC/hpf — ABNORMAL HIGH (ref 0–5)
pH: 6 (ref 5.0–8.0)

## 2022-06-21 LAB — COMPREHENSIVE METABOLIC PANEL
ALT: 9 U/L (ref 0–44)
AST: 13 U/L — ABNORMAL LOW (ref 15–41)
Albumin: 4.3 g/dL (ref 3.5–5.0)
Alkaline Phosphatase: 37 U/L — ABNORMAL LOW (ref 38–126)
Anion gap: 10 (ref 5–15)
BUN: 13 mg/dL (ref 6–20)
CO2: 22 mmol/L (ref 22–32)
Calcium: 9.8 mg/dL (ref 8.9–10.3)
Chloride: 107 mmol/L (ref 98–111)
Creatinine, Ser: 1.18 mg/dL — ABNORMAL HIGH (ref 0.44–1.00)
GFR, Estimated: 55 mL/min — ABNORMAL LOW (ref >60)
Glucose, Bld: 106 mg/dL — ABNORMAL HIGH (ref 70–99)
Potassium: 4 mmol/L (ref 3.5–5.1)
Sodium: 139 mmol/L (ref 135–145)
Total Bilirubin: 1.2 mg/dL (ref 0.3–1.2)
Total Protein: 8.5 g/dL — ABNORMAL HIGH (ref 6.5–8.1)

## 2022-06-21 LAB — LIPASE, BLOOD: Lipase: 29 U/L (ref 11–51)

## 2022-06-21 MED ORDER — FLUCONAZOLE 150 MG PO TABS: 150.0000 mg | ORAL_TABLET | Freq: Every day | ORAL | 0 refills | Status: DC

## 2022-06-21 MED ORDER — AMLODIPINE BESYLATE 10 MG PO TABS: 10.0000 mg | ORAL_TABLET | Freq: Every day | ORAL | 1 refills | Status: DC

## 2022-06-21 MED ORDER — IOHEXOL 350 MG/ML SOLN
100.0000 mL | Freq: Once | INTRAVENOUS | Status: AC | PRN
Start: 2022-06-21 — End: 2022-06-21
  Administered 2022-06-21: 100 mL via INTRAVENOUS

## 2022-06-21 MED ORDER — SODIUM CHLORIDE 0.9 % IV BOLUS
1000.0000 mL | Freq: Once | INTRAVENOUS | Status: AC
  Administered 2022-06-21: 1000 mL via INTRAVENOUS

## 2022-06-21 MED ORDER — APIXABAN 5 MG PO TABS
5.0000 mg | ORAL_TABLET | ORAL | Status: AC
  Administered 2022-06-21: 5 mg via ORAL
  Filled 2022-06-21: qty 1

## 2022-06-21 MED ORDER — CEPHALEXIN 500 MG PO CAPS: 500.0000 mg | ORAL_CAPSULE | Freq: Three times a day (TID) | ORAL | 0 refills | Status: DC

## 2022-06-21 MED ORDER — SODIUM CHLORIDE 0.9 % IV SOLN
1.0000 g | Freq: Once | INTRAVENOUS | Status: AC
  Administered 2022-06-21: 1 g via INTRAVENOUS
  Filled 2022-06-21: qty 10

## 2022-06-21 MED ORDER — FENTANYL CITRATE PF 50 MCG/ML IJ SOSY
50.0000 ug | PREFILLED_SYRINGE | Freq: Once | INTRAMUSCULAR | Status: AC
  Administered 2022-06-21: 50 ug via INTRAVENOUS
  Filled 2022-06-21: qty 1

## 2022-06-21 MED ORDER — HYDROCODONE-ACETAMINOPHEN 5-325 MG PO TABS: 1.0000 | ORAL_TABLET | Freq: Two times a day (BID) | ORAL | 0 refills | Status: DC | PRN

## 2022-06-21 MED ORDER — APIXABAN 5 MG PO TABS: 5.0000 mg | ORAL_TABLET | Freq: Two times a day (BID) | ORAL | 0 refills | Status: DC

## 2022-06-21 NOTE — Telephone Encounter (Signed)
Patient needs refill on   amLODipine (NORVASC) 10 MG tablet

## 2022-06-21 NOTE — ED Triage Notes (Signed)
Pt presents with back pain between her shoulders, radiating to stomach, with SOB, pt had seen in ED for same 05/30/22, had  Gallbladder removed on 05/27/22.

## 2022-06-21 NOTE — Discharge Instructions (Signed)
Please be aware that you have the following abnormalities that will need to be followed up by your doctor  1.  You have a small blood clot in your left lung, this is very small and does not appear to be causing any problems but I do want you to start on a blood thinner for the next month and have Dr. Moshe Cipro guide the care after that.  You should take this pill twice daily  2.  UTI - you have a urinary infection - this will need an antibiotic 3 times daily for 7 days (Keflex).    3.  You have a very large kidney stone on the left side, this is not causing any trouble but is blocking your kidney and should be followed up by a kidney specialist or a urologist.  Again Dr. Moshe Cipro can refer you for this.

## 2022-06-21 NOTE — Assessment & Plan Note (Signed)
Recent CT of the abdomen shows  Intrahepatic and extrahepatic biliary duct distension is mild-to-moderate not well evaluated on previous imaging but nondilated preoperatively. The possibility of choledocholithiasis is considered. Biliary injury could also potentially present with dilated biliary tree but there is no discrete transition point in the entire biliary tree is dilated on today's study  Shortness of breath most likely  related to upper quadrant  abdominal pain, GERD 5 325 mg 1 tablet twice daily as needed ordered.  Urgent referral sent to GI

## 2022-06-21 NOTE — ED Notes (Signed)
Patient transported to CT 

## 2022-06-21 NOTE — ED Provider Notes (Signed)
[facility] EMERGENCY DEPARTMENT Provider Note   CSN: [id number] Arrival date & time: 06/21/22  1801     History  Chief Complaint  Patient presents with   Post-op Problem    Olivia Woodward is a 56 y.o. female.  HPI  This patient is a 56 year old female, she has hypertension on amlodipine, no anticoagulants, had a laparoscopic cholecystectomy on July 10, 3 days later she was seen in the emergency department because of abdominal and chest pain.  At that time she had a CT angiogram of the chest and a CT abdomen and pelvis and then an MRCP none of which showed any specific postoperative pathology.  There is possibly an effusion with some infiltrate at the base of the lungs but otherwise unremarkable.  She patient comes back today approximately 3 weeks later with complaints of worsening pain in the right upper quadrant with associated pain going into the right chest the right upper back and shoulders and radiating across the shoulders.  She also complains of some shortness of breath.  She has had subjective fevers and chills, denies any swelling of the legs.  Home Medications Prior to Admission medications   Medication Sig Start Date End Date Taking? Authorizing Provider  ALPRAZolam (XANAX) 0.25 MG tablet Take 1 tablet (0.25 mg total) by mouth 2 (two) times daily as needed for anxiety. 04/25/22 06/24/22 Yes Hisada, Elie Goody, MD  amLODipine (NORVASC) 10 MG tablet Take 1 tablet (10 mg total) by mouth daily. 06/21/22  Yes Alvira Monday, FNP  apixaban (ELIQUIS) 5 MG TABS tablet Take 1 tablet (5 mg total) by mouth 2 (two) times daily. 06/21/22 07/21/22 Yes Noemi Chapel, MD  cariprazine (VRAYLAR) 1.5 MG capsule Take 1 capsule (1.5 mg total) by mouth daily. 06/02/22 08/31/22 Yes Hisada, Elie Goody, MD  cephALEXin (KEFLEX) 500 MG capsule Take 1 capsule (500 mg total) by mouth 3 (three) times daily for 7 days. 06/21/22 06/28/22 Yes Noemi Chapel, MD  Cyanocobalamin (VITAMIN B12) 1000 MCG TBCR Take 2 tablets by mouth  once daily 04/17/22  Yes Fayrene Helper, MD  ergocalciferol (VITAMIN D2) 1.25 MG (50000 UT) capsule Take 1 capsule (50,000 Units total) by mouth once a week. One capsule once weekly 04/17/22  Yes Fayrene Helper, MD  escitalopram (LEXAPRO) 20 MG tablet Take 1 tablet (20 mg total) by mouth daily. 05/27/22 08/25/22 Yes Hisada, Elie Goody, MD  Eszopiclone 3 MG TABS Take 1 tablet (3 mg total) by mouth at bedtime. 06/16/22 09/14/22 Yes Norman Clay, MD  lamoTRIgine (LAMICTAL) 25 MG tablet Take 4 tablets (100 mg total) by mouth 2 (two) times daily. Week 1 25 mg bid po, Week 2- 25 mg in AM and 50 mg in PM, Week 3- 50 mg bid po, Week 4- 75 mg bid po, Week 5 100 mg bid po 06/04/22  Yes Dohmeier, Asencion Partridge, MD  ondansetron [facility]) 4 MG tablet Take one tablet by mouth once daily, as needed, for nausea 04/16/22  Yes Fayrene Helper, MD  pantoprazole (PROTONIX) 20 MG tablet Take 1 tablet (20 mg total) by mouth daily. 04/16/22  Yes Fayrene Helper, MD  rizatriptan (MAXALT-MLT) 10 MG disintegrating tablet Take 1 tablet (10 mg total) by mouth as needed for migraine. May repeat in 2 hours if needed 04/16/22  Yes Fayrene Helper, MD  spironolactone (ALDACTONE) 100 MG tablet Take 1 tablet by mouth once daily 06/21/22  Yes Fayrene Helper, MD  topiramate (TOPAMAX) 100 MG tablet Take 100 mg by mouth 2 (two) times  daily. 05/16/22  Yes [provider]  HYDROcodone-acetaminophen (NORCO) 5-325 MG tablet Take 1 tablet by mouth 2 (two) times daily as needed for moderate pain. Patient not taking: Reported on 06/21/2022 06/21/22   Renee Rival, FNP      Allergies    Ketorolac tromethamine    Review of Systems   Review of Systems  All other systems reviewed and are negative.   Physical Exam Updated Vital Signs BP 130/84   Pulse 86   Temp 98.4 F (36.9 C) (Oral)   Resp (!) 25   Ht 1.676 m ('5\' 6"'$ )   Wt 110.7 kg   SpO2 98%   BMI 39.38 kg/m  Physical Exam Vitals and nursing note reviewed.   Constitutional:      General: She is not in acute distress.    Appearance: She is well-developed.  HENT:     Head: Normocephalic and atraumatic.     Mouth/Throat:     Pharynx: No oropharyngeal exudate.  Eyes:     General: No scleral icterus.       Right eye: No discharge.        Left eye: No discharge.     Conjunctiva/sclera: Conjunctivae normal.     Pupils: Pupils are equal, round, and reactive to light.  Neck:     Thyroid: No thyromegaly.     Vascular: No JVD.  Cardiovascular:     Rate and Rhythm: Normal rate and regular rhythm.     Heart sounds: Normal heart sounds. No murmur heard.    No friction rub. No gallop.  Pulmonary:     Effort: Pulmonary effort is normal. No respiratory distress.     Breath sounds: Normal breath sounds. No wheezing or rales.  Abdominal:     General: Bowel sounds are normal. There is no distension.     Palpations: Abdomen is soft. There is no mass.     Tenderness: There is no abdominal tenderness.  Musculoskeletal:        General: No tenderness. Normal range of motion.     Cervical back: Normal range of motion and neck supple.     Right lower leg: No edema.     Left lower leg: No edema.  Lymphadenopathy:     Cervical: No cervical adenopathy.  Skin:    General: Skin is warm and dry.     Findings: No erythema or rash.  Neurological:     Mental Status: She is alert.     Coordination: Coordination normal.  Psychiatric:        Behavior: Behavior normal.     ED Results / Procedures / Treatments   Labs (all labs ordered are listed, but only abnormal results are displayed) Labs Reviewed  COMPREHENSIVE METABOLIC PANEL - Abnormal; Notable for the following components:      Result Value   Glucose, Bld 106 (*)    Creatinine, Ser 1.18 (*)    Total Protein 8.5 (*)    AST 13 (*)    Alkaline Phosphatase 37 (*)    GFR, Estimated 55 (*)    All other components within normal limits  CBC - Abnormal; Notable for the following components:   RBC 5.74  (*)    Hemoglobin 18.4 (*)    HCT 58.6 (*)    MCV 102.1 (*)    All other components within normal limits  URINALYSIS, ROUTINE W REFLEX MICROSCOPIC - Abnormal; Notable for the following components:   Color, Urine AMBER (*)    APPearance  CLOUDY (*)    Specific Gravity, Urine >1.046 (*)    Hgb urine dipstick SMALL (*)    Protein, ur 100 (*)    Leukocytes,Ua LARGE (*)    RBC / HPF >50 (*)    WBC, UA >50 (*)    Bacteria, UA FEW (*)    All other components within normal limits  URINE CULTURE  LIPASE, BLOOD    EKG EKG Interpretation  Date/Time:  Friday June 21 2022 18:34:09 EDT Ventricular Rate:  89 PR Interval:  155 QRS Duration: 93 QT Interval:  349 QTC Calculation: 425 R Axis:   18 Text Interpretation: Sinus rhythm Abnormal R-wave progression, early transition ECG OTHERWISE WITHIN NORMAL LIMITS Confirmed by Noemi Chapel [id number]) on 06/21/2022 6:51:46 PM  Radiology CT Angio Chest PE W and/or Wo Contrast  Result Date: 06/21/2022 CLINICAL DATA:  Pulmonary embolism suspected, high probability. Back pain radiating to stomach with shortness of breath. EXAM: CT ANGIOGRAPHY CHEST CT ABDOMEN AND PELVIS WITH CONTRAST TECHNIQUE: Multidetector CT imaging of the chest was performed using the standard protocol during bolus administration of intravenous contrast. Multiplanar CT image reconstructions and MIPs were obtained to evaluate the vascular anatomy. Multidetector CT imaging of the abdomen and pelvis was performed using the standard protocol during bolus administration of intravenous contrast. RADIATION DOSE REDUCTION: This exam was performed according to the departmental dose-optimization program which includes automated exposure control, adjustment of the mA and/or kV according to patient size and/or use of iterative reconstruction technique. CONTRAST:  153m OMNIPAQUE IOHEXOL 350 MG/ML SOLN COMPARISON:  05/30/2022. FINDINGS: CTA CHEST FINDINGS Cardiovascular: The heart is normal in size and  there is a trace pericardial effusion. Scattered coronary artery calcifications are noted. The aorta is normal in caliber. The pulmonary trunk is borderline distended. A subsegmental pulmonary artery filling defect is present in the left lower lobe, coronal image 112. Evaluation is limited due to infiltrates at the lung bases. No evidence of right heart strain. Mediastinum/Nodes: No mediastinal or hilar lymphadenopathy. Prominent lymph nodes are noted in the subpectoral regions bilaterally measuring 9 mm on the right and 1.3 cm on the left. Rim calcified nodules are noted in the thyroid gland. The thyroid gland and esophagus are within normal limits. There is a small hiatal hernia. Lungs/Pleura: Lung volumes are low and there is strandy atelectasis or infiltrate at the lung bases. No effusion or pneumothorax. Musculoskeletal: Degenerative changes are present in the thoracic spine. No acute osseous abnormality. Review of the MIP images confirms the above findings. CT ABDOMEN and PELVIS FINDINGS Hepatobiliary: No focal liver abnormality. No biliary ductal dilatation. The gallbladder is surgically absent and there is a small amount of residual fluid at the gallbladder fossa measuring 2.6 x 1.1 cm, improved from the prior exam. No air or abnormal enhancement is seen. Pancreas: Unremarkable. No pancreatic ductal dilatation or surrounding inflammatory changes. Spleen: The spleen is normal in size. There is an ill-defined subcentimeter hypodensity in the spleen measuring 8 mm, possible cyst or hemangioma. Adrenals/Urinary Tract: There is a nodule in the right adrenal gland measuring 1.9 cm with indeterminate imaging characteristics. The left adrenal gland is within normal limits. Cysts are noted in the kidneys bilaterally. A partially duplicated collecting system is noted on the left. Multiple renal calculi are noted in the lower pole the left kidney. There is a 2.8 cm calculus in the lower pole collecting system resulting  in severe obstructive uropathy with renal cortical thinning which is likely chronic. No renal calculus or obstructive uropathy on  the right. The bladder is unremarkable. Stomach/Bowel: Gastric surgery changes are noted. Appendix appears normal. No evidence of bowel wall thickening, distention, or inflammatory changes. No free air or pneumatosis. Scattered diverticula are present along the colon without evidence of diverticulitis. Vascular/Lymphatic: No significant vascular findings. Prominent lymph nodes are noted in the periaortic space on the left at the level of the left kidney, along the iliac chains bilaterally and in the inguinal regions bilaterally. Reproductive: Status post hysterectomy. No adnexal masses. Other: No abdominopelvic ascites. Fat containing ventral abdominal wall hernias are noted in the midline. Musculoskeletal: Degenerative changes in the lumbar spine. No acute osseous abnormality. Review of the MIP images confirms the above findings. IMPRESSION: 1. Small subsegmental pulmonary embolus in the left lower lobe. No evidence of right heart strain. 2. Patchy atelectasis or infiltrate at the lung bases. 3. Status post cholecystectomy with a small amount of residual fluid in the gallbladder fossa measuring 2.6 x 1.1 cm. No residual air, fat stranding, or abnormal enhancement to suggest abscess. If there is clinical concern for biloma, consider HIDA scan for further evaluation. 4. 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 There is nephrolithiasis in the lower pole the left kidney with a 2.8 cm calculus in the lower pole renal pelvis resulting in severe obstructive uropathy with associated cortical thinning which is likely chronic and unchanged from the prior exam. 5. Gastric surgery changes with small hiatal hernia. 6. Stable indeterminate right adrenal nodule. Multiphase CT with adrenal protocol is suggested for further characterization on follow-up. 7. Remaining incidental findings  as described above. Critical findings were reported to Dr. Sabra Heck at 9:37 p.m. Electronically Signed   By: Brett Fairy M.D.   On: 06/21/2022 21:45   CT ABDOMEN PELVIS W CONTRAST  Result Date: 06/21/2022 CLINICAL DATA:  Pulmonary embolism suspected, high probability. Back pain radiating to stomach with shortness of breath. EXAM: CT ANGIOGRAPHY CHEST CT ABDOMEN AND PELVIS WITH CONTRAST TECHNIQUE: Multidetector CT imaging of the chest was performed using the standard protocol during bolus administration of intravenous contrast. Multiplanar CT image reconstructions and MIPs were obtained to evaluate the vascular anatomy. Multidetector CT imaging of the abdomen and pelvis was performed using the standard protocol during bolus administration of intravenous contrast. RADIATION DOSE REDUCTION: This exam was performed according to the departmental dose-optimization program which includes automated exposure control, adjustment of the mA and/or kV according to patient size and/or use of iterative reconstruction technique. CONTRAST:  182m OMNIPAQUE IOHEXOL 350 MG/ML SOLN COMPARISON:  05/30/2022. FINDINGS: CTA CHEST FINDINGS Cardiovascular: The heart is normal in size and there is a trace pericardial effusion. Scattered coronary artery calcifications are noted. The aorta is normal in caliber. The pulmonary trunk is borderline distended. A subsegmental pulmonary artery filling defect is present in the left lower lobe, coronal image 112. Evaluation is limited due to infiltrates at the lung bases. No evidence of right heart strain. Mediastinum/Nodes: No mediastinal or hilar lymphadenopathy. Prominent lymph nodes are noted in the subpectoral regions bilaterally measuring 9 mm on the right and 1.3 cm on the left. Rim calcified nodules are noted in the thyroid gland. The thyroid gland and esophagus are within normal limits. There is a small hiatal hernia. Lungs/Pleura: Lung volumes are low and there is strandy atelectasis or  infiltrate at the lung bases. No effusion or pneumothorax. Musculoskeletal: Degenerative changes are present in the thoracic spine. No acute osseous abnormality. Review of the MIP images confirms the above findings. CT ABDOMEN and PELVIS FINDINGS Hepatobiliary: No  focal liver abnormality. No biliary ductal dilatation. The gallbladder is surgically absent and there is a small amount of residual fluid at the gallbladder fossa measuring 2.6 x 1.1 cm, improved from the prior exam. No air or abnormal enhancement is seen. Pancreas: Unremarkable. No pancreatic ductal dilatation or surrounding inflammatory changes. Spleen: The spleen is normal in size. There is an ill-defined subcentimeter hypodensity in the spleen measuring 8 mm, possible cyst or hemangioma. Adrenals/Urinary Tract: There is a nodule in the right adrenal gland measuring 1.9 cm with indeterminate imaging characteristics. The left adrenal gland is within normal limits. Cysts are noted in the kidneys bilaterally. A partially duplicated collecting system is noted on the left. Multiple renal calculi are noted in the lower pole the left kidney. There is a 2.8 cm calculus in the lower pole collecting system resulting in severe obstructive uropathy with renal cortical thinning which is likely chronic. No renal calculus or obstructive uropathy on the right. The bladder is unremarkable. Stomach/Bowel: Gastric surgery changes are noted. Appendix appears normal. No evidence of bowel wall thickening, distention, or inflammatory changes. No free air or pneumatosis. Scattered diverticula are present along the colon without evidence of diverticulitis. Vascular/Lymphatic: No significant vascular findings. Prominent lymph nodes are noted in the periaortic space on the left at the level of the left kidney, along the iliac chains bilaterally and in the inguinal regions bilaterally. Reproductive: Status post hysterectomy. No adnexal masses. Other: No abdominopelvic ascites. Fat  containing ventral abdominal wall hernias are noted in the midline. Musculoskeletal: Degenerative changes in the lumbar spine. No acute osseous abnormality. Review of the MIP images confirms the above findings. IMPRESSION: 1. Small subsegmental pulmonary embolus in the left lower lobe. No evidence of right heart strain. 2. Patchy atelectasis or infiltrate at the lung bases. 3. Status post cholecystectomy with a small amount of residual fluid in the gallbladder fossa measuring 2.6 x 1.1 cm. No residual air, fat stranding, or abnormal enhancement to suggest abscess. If there is clinical concern for biloma, consider HIDA scan for further evaluation. 4. 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 There is nephrolithiasis in the lower pole the left kidney with a 2.8 cm calculus in the lower pole renal pelvis resulting in severe obstructive uropathy with associated cortical thinning which is likely chronic and unchanged from the prior exam. 5. Gastric surgery changes with small hiatal hernia. 6. Stable indeterminate right adrenal nodule. Multiphase CT with adrenal protocol is suggested for further characterization on follow-up. 7. Remaining incidental findings as described above. Critical findings were reported to Dr. Sabra Heck at 9:37 p.m. Electronically Signed   By: Brett Fairy M.D.   On: 06/21/2022 21:45    Procedures Procedures    Medications Ordered in ED Medications  cefTRIAXone (ROCEPHIN) 1 g in sodium chloride 0.9 % 100 mL IVPB (1 g Intravenous New Bag/Given 06/21/22 2233)  iohexol (OMNIPAQUE) 350 MG/ML injection 100 mL (100 mLs Intravenous Contrast Given 06/21/22 2049)  sodium chloride 0.9 % bolus 1,000 mL (1,000 mLs Intravenous New Bag/Given 06/21/22 2156)  fentaNYL (SUBLIMAZE) injection 50 mcg (50 mcg Intravenous Given 06/21/22 2156)  apixaban (ELIQUIS) tablet 5 mg (5 mg Oral Given 06/21/22 2231)    ED Course/ Medical Decision Making/ A&P                           Medical Decision  Making Amount and/or Complexity of Data Reviewed Labs: ordered. Radiology: ordered.  Risk Prescription drug management.  This patient presents to the ED for concern of chest pain with some associated shortness of breath and upper abdominal pain, this involves an extensive number of treatment options, and is a complaint that carries with it a high risk of complications and morbidity.  The differential diagnosis includes postoperative abscess, infection, pulmonary embolism, pneumonia   Co morbidities that complicate the patient evaluation  Recent surgery, obesity   Additional history obtained:  Additional history obtained from electronic medical record External records from outside source obtained and reviewed including postoperative notes, imaging obtained postoperatively   Lab Tests:  I Ordered, and personally interpreted labs.  The pertinent results include:  UTI, has some, creatinine 1.1, metabolic panel is otherwise unremarkable.  CBC shows hemoglobin of 18 consistent with hemoconcentration and dehydration   Imaging Studies ordered:  I ordered imaging studies including CT scans of the chest abdomen and pelvis which do confirm that the patient has a couple of findings including a pulmonary embolus I independently visualized and interpreted imaging which showed small pulmonary embolism, subsegmental on the left lower lobe as well as what appears to be a large kidney stone on the left which is chronic and causing chronic hydronephrosis I agree with the radiologist interpretation   Cardiac Monitoring: / EKG:  The patient was maintained on a cardiac monitor.  I personally viewed and interpreted the cardiac monitored which showed an underlying rhythm of: Normal sinus rhythm   Problem List / ED Course / Critical interventions / Medication management  Urinary tract infection as well as pulmonary embolism I ordered medication including cephalexin, Eliquis and IV fluid for  dehydration infection and early blood clot Reevaluation of the patient after these medicines showed that the patient improved I have reviewed the patients home medicines and have made adjustments as needed   Social Determinants of Health:  None   Test / Admission - Considered:  Considered admission to the hospital with the patient is very well-appearing with normal vital signs, no fever, no hypoxia a very small blood clot with no signs of heart strain which can definitely be treated as an outpatient with Eliquis.  Additionally the patient is complaining of fatigue which is explained by the patient's dehydration and urinary tract infection.  Culture sent, antibiotic started, patient explained all of her results and stable for discharge   I have discussed with the patient at the bedside the results, and the meaning of these results.  They have expressed her understanding to the need for follow-up with primary care physician         Final Clinical Impression(s) / ED Diagnoses Final diagnoses:  Acute cystitis with hematuria  Single subsegmental pulmonary embolism without acute cor pulmonale ([place])    Rx / DC Orders ED Discharge Orders          Ordered    apixaban (ELIQUIS) 5 MG TABS tablet  2 times daily        06/21/22 2238    cephALEXin (KEFLEX) 500 MG capsule  3 times daily        06/21/22 2238              Noemi Chapel, MD 06/21/22 2240

## 2022-06-21 NOTE — Telephone Encounter (Signed)
Refill sent.

## 2022-06-21 NOTE — Progress Notes (Signed)
Virtual Visit via Telephone Note  I connected with Olivia Woodward @ on 06/21/22 at 12:40 PM EDT by telephone and verified that I am speaking with the correct person using two identifiers. I Spent 12 minutes on this telephone encounter  Location: Patient: home Provider: office   I discussed the limitations, risks, security and privacy concerns of performing an evaluation and management service by telephone and the availability of in person appointments. I also discussed with the patient that there may be a patient responsible charge related to this service. The patient expressed understanding and agreed to proceed.   History of Present Illness: Olivia Woodward with past medical history of essential hypertension, migraine headaches, GERD, depression, cholelithiasis status post lap Coley presents with complaints of sharp right upper quadrant abdominal pain that radiates to her back Pain is associated with shortness of breath, stated that her pain started about Three weeks ago, after she had her surgery,  Has hard time walking , taking her bath.  She has been taking Tylenol and ibuprofen but both were ineffective Observations/Objective:   Assessment and Plan: Right upper quadrant abdominal pain Recent CT of the abdomen shows  Intrahepatic and extrahepatic biliary duct distension is mild-to-moderate not well evaluated on previous imaging but nondilated preoperatively. The possibility of choledocholithiasis is considered. Biliary injury could also potentially present with dilated biliary tree but there is no discrete transition point in the entire biliary tree is dilated on today's study  Shortness of breath most likely  related to upper quadrant  abdominal pain, GERD 5 325 mg 1 tablet twice daily as needed ordered.  Urgent referral sent to GI   Follow Up Instructions:    I discussed the assessment and treatment plan with the patient. The patient was provided an opportunity to ask questions and  all were answered. The patient agreed with the plan and demonstrated an understanding of the instructions.   The patient was advised to call back or seek an in-person evaluation if the symptoms worsen or if the condition fails to improve as anticipated.

## 2022-06-24 ENCOUNTER — Telehealth: Payer: Self-pay | Admitting: Family Medicine

## 2022-06-24 ENCOUNTER — Encounter: Payer: Self-pay | Admitting: *Deleted

## 2022-06-24 NOTE — Telephone Encounter (Signed)
Pt called stating she was seen in ER over weekend for a blood clot & is unable to afford the blood thinners she was given. She is scheduled to be seen in 4 day (8/11). Wants to know if this can be addressed before her appt?

## 2022-06-24 NOTE — Telephone Encounter (Signed)
Patient called in regard to blood thinner that has been prescribed through ER. Patient has a blood clot. Pharm will not fill med order without pre auth. ER directed patient to call primary for pre auth. Patient wants a call back in regard.

## 2022-06-24 NOTE — Telephone Encounter (Signed)
Pt coming to collect sample

## 2022-06-25 LAB — URINE CULTURE: Culture: 50000 — AB

## 2022-06-26 ENCOUNTER — Telehealth: Payer: Self-pay

## 2022-06-26 NOTE — Telephone Encounter (Signed)
Post ED Visit - Positive Culture Follow-up  Culture report reviewed by antimicrobial stewardship pharmacist: [facility] Team '[x]'$  [address] Pharm.D. '[]'$  Heide Guile, Pharm.D., BCPS AQ-ID '[]'$  Parks Neptune, Pharm.D., BCPS '[]'$  Alycia Rossetti, Pharm.D., BCPS '[]'$  [place], Pharm.D., BCPS, AAHIVP '[]'$  Legrand Como, Pharm.D., BCPS, AAHIVP '[]'$  Salome Arnt, PharmD, BCPS '[]'$  Johnnette Gourd, PharmD, BCPS '[]'$  Hughes Better, PharmD, BCPS '[]'$  Leeroy Cha, PharmD '[]'$  Laqueta Linden, PharmD, BCPS '[]'$  Albertina Parr, PharmD  [facility] Team '[]'$  Leodis Sias, PharmD '[]'$  Lindell Spar, PharmD '[]'$  Royetta Asal, PharmD '[]'$  Graylin Shiver, Rph '[]'$  Rema Fendt) Glennon Mac, PharmD '[]'$  Arlyn Dunning, PharmD '[]'$  Netta Cedars, PharmD '[]'$  Dia Sitter, PharmD '[]'$  Leone Haven, PharmD '[]'$  Gretta Arab, PharmD '[]'$  Theodis Shove, PharmD '[]'$  Peggyann Juba, PharmD '[]'$  Reuel Boom, PharmD   Positive urine culture Treated with Cephalexin and Fluconazole, organism sensitive to the same and no further patient follow-up is required at this time.  [name] 06/26/2022, 10:36 AM

## 2022-06-27 ENCOUNTER — Encounter: Payer: Managed Care, Other (non HMO) | Admitting: Family Medicine

## 2022-06-28 ENCOUNTER — Telehealth: Payer: Self-pay | Admitting: Family Medicine

## 2022-06-28 ENCOUNTER — Ambulatory Visit (INDEPENDENT_AMBULATORY_CARE_PROVIDER_SITE_OTHER): Payer: Managed Care, Other (non HMO) | Admitting: Family Medicine

## 2022-06-28 ENCOUNTER — Encounter: Payer: Self-pay | Admitting: Family Medicine

## 2022-06-28 VITALS — BP 119/80 | HR 81 | Ht 66.0 in | Wt 238.0 lb

## 2022-06-28 DIAGNOSIS — N200 Calculus of kidney: Secondary | ICD-10-CM

## 2022-06-28 DIAGNOSIS — R0789 Other chest pain: Secondary | ICD-10-CM

## 2022-06-28 DIAGNOSIS — Z0001 Encounter for general adult medical examination with abnormal findings: Secondary | ICD-10-CM

## 2022-06-28 DIAGNOSIS — R1011 Right upper quadrant pain: Secondary | ICD-10-CM | POA: Diagnosis not present

## 2022-06-28 DIAGNOSIS — I2699 Other pulmonary embolism without acute cor pulmonale: Secondary | ICD-10-CM | POA: Diagnosis not present

## 2022-06-28 DIAGNOSIS — N139 Obstructive and reflux uropathy, unspecified: Secondary | ICD-10-CM | POA: Diagnosis not present

## 2022-06-28 DIAGNOSIS — M62838 Other muscle spasm: Secondary | ICD-10-CM | POA: Diagnosis not present

## 2022-06-28 DIAGNOSIS — F322 Major depressive disorder, single episode, severe without psychotic features: Secondary | ICD-10-CM

## 2022-06-28 DIAGNOSIS — Z9049 Acquired absence of other specified parts of digestive tract: Secondary | ICD-10-CM

## 2022-06-28 DIAGNOSIS — K5900 Constipation, unspecified: Secondary | ICD-10-CM

## 2022-06-28 MED ORDER — CYCLOBENZAPRINE HCL 10 MG PO TABS: ORAL_TABLET | ORAL | 0 refills | Status: DC

## 2022-06-28 MED ORDER — APIXABAN 5 MG PO TABS: 5.0000 mg | ORAL_TABLET | Freq: Two times a day (BID) | ORAL | 3 refills | Status: DC

## 2022-06-28 NOTE — Telephone Encounter (Signed)
Said she has been thinking about what you asked her and she thinks now that she IS going to need to take some time off of work. Wants to know if you will fill out an fmla for her. Pls advise

## 2022-06-28 NOTE — Patient Instructions (Addendum)
F/u in mid September call if you need me sooner.  Flu vaccine at visit.  Nurse please arrange for 24-monthsupply of Eliquis to treat pulmonary embolus.  You have been referred to the pulmonary specialist to evaluate your ongoing right-sided chest pain associated with a pulmonary embolus.PLEASE GET APPT AT CHECKOUT  New for right neck spasm is FLEXERIL  twice daily as needed.  You are being referred to urology because of kidney stones 1 of which is obstructing your native kidney.  I will discuss with Dr. JArnoldo Moraleongoing pain over your surgical area and decide on any additional imaging needed or alternatively may arrange for a follow-up visit with him.  For constipation you need to take a laxative these are all over-the-counter options are magnesium citrate, milk of magnesia, Dulcolax tablets.  Also there are Dulcolax suppositories.   [facility] consider it a privelige to serve you.

## 2022-06-28 NOTE — Assessment & Plan Note (Addendum)
Left with right chest pain and possible infarct,ensure eliquis prescribed and available to pt for 4 months, also refer to pulmonary

## 2022-06-28 NOTE — Progress Notes (Signed)
    Olivia Woodward     MRN: [id number]      DOB: 01/14/1966  HPI: Patient is in for annual physical exam. MULTIPLE concerns also addressed New diagnosis of pulmonary embolus,on 06/28/2022,  left lower , also patchy atelectasis/ infiltrate  noted at lung bases C/o persistent right sided chest pain new  C/o perisitent RUQ pain s/p cholecystectomy on 7/11, has had several D visits as aresult of this, and has no additional f/u planned with surgery who she recently saw C/o right neck spasm C/o uncontrolled depression and anxiety,not suicidal or homicidal but overwhelmed with multiple new serious health challenges,  treated by Pych C/o constiption , likely aggravated by pain meds Residual fluid in gallbladder fossa following cholecystectomy on 05/27/2022 C/o right neck spasm andpain withreduced ROM  PE: BP 119/80 (BP Location: Right Arm, Patient Position: Sitting, Cuff Size: Large)   Pulse 81   Ht '5\' 6"'$  (1.676 m)   Wt 238 lb 0.6 oz (108 kg)   SpO2 98%   BMI 38.42 kg/m   Pleasant  female, alert and oriented x 3, in no cardio-pulmonary distress.Anxious and at imes tearful Afebrile. HEENT No facial trauma or asymetry. Sinuses non tender.  Extra occullar muscles intact.. External ears normal, . Neck: reduced ROM with right trapezius spasm, no adenopathy,JVD or thyromegaly.No bruits.  Chest: Clear to ascultation bilaterally.No crackles or wheezes. Non tender to palpation    Cardiovascular system; Heart sounds normal,  S1 and  S2 ,no S3.  No murmur, or thrill. Apical beat not displaced Peripheral pulses normal.  Abdomen: Soft, RUQ tender, no guarding or rebound, op site well healed No bruits. Bowel sounds normal.    Musculoskeletal exam: Decreased ROM of spine, hips , shoulders and knees. No deformity ,swelling or crepitus noted. No muscle wasting or atrophy.   Neurologic: Cranial nerves 2 to 12 intact. Power, tone ,sensation and reflexes normal throughout. No disturbance  in gait. No tremor.  Skin: Intact, no ulceration, erythema , scaling or rash noted. Pigmentation normal throughout  Psych; Anxious and depressed Assessment & Plan:  Pulmonary embolus ([place]) Left with right chest pain and possible infarct,ensure eliquis prescribed and available to pt for 4 months, also refer to pulmonary  Annual visit for general adult medical examination with abnormal findings Annual exam as documented. Counseling done  re healthy lifestyle involving commitment to 150 minutes exercise per week, heart healthy diet, and attaining healthy weight.The importance of adequate sleep also discussed. Regular seat belt use and home safety, is also discussed. Changes in health habits are decided on by the patient with goals and time frames  set for achieving them. Immunization and cancer screening needs are specifically addressed at this visit.   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

## 2022-07-02 ENCOUNTER — Other Ambulatory Visit: Payer: Self-pay | Admitting: Psychiatry

## 2022-07-02 DIAGNOSIS — F331 Major depressive disorder, recurrent, moderate: Secondary | ICD-10-CM

## 2022-07-05 ENCOUNTER — Institutional Professional Consult (permissible substitution): Payer: Managed Care, Other (non HMO) | Admitting: Internal Medicine
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
## 2022-07-10 ENCOUNTER — Encounter: Payer: Self-pay | Admitting: Psychiatry

## 2022-07-10 ENCOUNTER — Telehealth (INDEPENDENT_AMBULATORY_CARE_PROVIDER_SITE_OTHER): Payer: 59 | Admitting: Psychiatry

## 2022-07-10 DIAGNOSIS — G4700 Insomnia, unspecified: Secondary | ICD-10-CM | POA: Diagnosis not present

## 2022-07-10 DIAGNOSIS — F331 Major depressive disorder, recurrent, moderate: Secondary | ICD-10-CM

## 2022-07-10 MED ORDER — ALPRAZOLAM 0.25 MG PO TABS: 0.2500 mg | ORAL_TABLET | Freq: Two times a day (BID) | ORAL | 0 refills | Status: DC | PRN

## 2022-07-10 MED ORDER — CARIPRAZINE HCL 3 MG PO CAPS: 3.0000 mg | ORAL_CAPSULE | Freq: Every day | ORAL | 1 refills | Status: DC

## 2022-07-12 ENCOUNTER — Other Ambulatory Visit: Payer: Self-pay | Admitting: Family Medicine

## 2022-07-15 ENCOUNTER — Encounter: Payer: Self-pay | Admitting: Family Medicine

## 2022-07-15 DIAGNOSIS — M62838 Other muscle spasm: Secondary | ICD-10-CM | POA: Insufficient documentation

## 2022-07-15 DIAGNOSIS — Z0001 Encounter for general adult medical examination with abnormal findings: Secondary | ICD-10-CM | POA: Insufficient documentation

## 2022-07-15 NOTE — Assessment & Plan Note (Signed)
Unconcontrolled,management per Psych, not suicidal or homicidal

## 2022-07-15 NOTE — Assessment & Plan Note (Signed)
Persistent RUQ pain following cholecystectomy, recent scan shows small collection  Of fluid and possible biloma, has appt forGI but will also refer back to gen surg

## 2022-07-15 NOTE — Assessment & Plan Note (Signed)

## 2022-07-15 NOTE — Assessment & Plan Note (Signed)
Flexeril twice daily, as needed

## 2022-07-15 NOTE — Assessment & Plan Note (Signed)
OTC med of choice recommended to be used every 3 days if needed, also encouraged daily metamucil and stool softener also regular exercise

## 2022-07-15 NOTE — Assessment & Plan Note (Signed)
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

## 2022-07-16 NOTE — Progress Notes (Unsigned)
Referring Provider: Renee Rival, FNP Primary Care Physician:  Fayrene Helper, MD Primary GI Physician: Dr. Gala Romney  No chief complaint on file.   HPI:   Olivia Woodward is a 56 y.o. female with history of IDA now resolved, colonic adenomas with colonoscopy due in 2025, gastric bypass in 2007, presenting today for follow-up of constipation and left sided abdominal pain. Also received referral from PCP for RUQ abdominal pain.   Last seen in our office 11/22/2021.  She reported left upper quadrant and left lower quadrant abdominal pain that started 1 week prior.  Black stool for about 1 week.  Also felt constipated passing only small balls of stool.  Referred history of constipation.  She is not taking iron.  Felt nauseated but no vomiting.  Denies postprandial abdominal pain.  Noted poor appetite this week and felt full.  Felt like there was alcohol in the left upper quadrant at times.  Chronic.  Described as a sharp pain almost like a contraction.  Abdominal exam is benign.  Declined rectal exam.  Recommended abdominal x-ray, MiraLAX purge followed by Linzess 290 mcg daily, iFOBT, CBC, and call if any further black stool.  She had an abdominal x-ray that showed moderate stool burden, several calcifications in the left hemiabdomen possibly due to calcified intraluminal contents and/or renal stones.  Recommended MiraLAX purge, then start Linzess 290 mcg daily.   CBC with hemoglobin of 13.8.  MCV was elevated.  Recommended checking B12.  She also reported her bowel movements had returned to normal in color.   B12 was low at 115.  Recommended 2000 mcg every day.  Also recommended folic acid 1 mg daily.  Repeat blood work in 4 weeks  Labs we ordered were not repeated, but labs in May with PCP showed B12 of 150, hemoglobin within normal limits at 12.8, MCV normal.  PCP sent in prescription for B12 2000 mcg daily.  In the interim, patient underwent laparoscopic cholecystectomy 05/27/2022  for suspected biliary colic.  Evaluated emergency room on 7/13 for abdominal pain and chest pain.  CT angiogram of the chest and CT abdomen pelvis And MRCP with no specific postop pathology.  She return to the emergency room on 8/4 with worsening pain in RUQ with associated pain radiating to the right chest, right upper back, and across shoulder blades with associated shortness of breath.  She was found to have cystitis and small subsegmental PE in the left lower lobe, patchy atelectasis or infiltrate in the lung bases.  CT A/P with contrast with small residual fluid in the gallbladder fossa, left obstructive uropathy secondary to nephrolithiasis which is a chronic finding, stable indeterminate right adrenal nodule.  She was discharged on cephalexin and apixaban.   PCP has referred her to pulmonology, urology, and back to general surgery due to persistent RUQ abdominal pain.  Today:     B12 deficiency.   H. pylori IgG negative in May.  Past Medical History:  Diagnosis Date   Anemia    Anxiety    [employer] 03/18/2021   Depression    [employer] 03/18/2021   Generalized headaches    Hypertension     Past Surgical History:  Procedure Laterality Date   ABDOMINAL HYSTERECTOMY     fibroids, partial   BREAST REDUCTION SURGERY  2005   CHOLECYSTECTOMY N/A 05/27/2022   Procedure: LAPAROSCOPIC CHOLECYSTECTOMY;  Surgeon: Aviva Signs, MD;  Location: [facility];  Service: General;  Laterality: N/A;   COLONOSCOPY WITH PROPOFOL N/A  05/10/2019   Two 5 to 6 mm polyps in the sigmoid colon and in the ascending colon, removed with a   GASTRIC BYPASS  2007   POLYPECTOMY  05/10/2019   Procedure: POLYPECTOMY;  Surgeon: Daneil Dolin, MD;  Location: [facility];  Service: Endoscopy;;  colon    Current Outpatient Medications  Medication Sig Dispense Refill   [START ON 08/02/2022] ALPRAZolam (XANAX) 0.25 MG tablet Take 1 tablet (0.25 mg total) by mouth 2 (two) times daily as needed. for anxiety 60 tablet 0    amLODipine (NORVASC) 10 MG tablet Take 1 tablet (10 mg total) by mouth daily. 90 tablet 1   apixaban (ELIQUIS) 5 MG TABS tablet Take 1 tablet (5 mg total) by mouth 2 (two) times daily. 60 tablet 3   cariprazine (VRAYLAR) 3 MG capsule Take 1 capsule (3 mg total) by mouth daily. 30 capsule 1   Cyanocobalamin (VITAMIN B12) 1000 MCG TBCR Take 2 tablets by mouth once daily 180 tablet 2   cyclobenzaprine (FLEXERIL) 10 MG tablet Take one tablet by mouth twice daily for 5 days , then as needed, for neck spasm 30 tablet 0   ergocalciferol (VITAMIN D2) 1.25 MG (50000 UT) capsule Take 1 capsule (50,000 Units total) by mouth once a week. One capsule once weekly 12 capsule 2   escitalopram (LEXAPRO) 20 MG tablet Take 1 tablet (20 mg total) by mouth daily. 30 tablet 2   Eszopiclone 3 MG TABS Take 1 tablet (3 mg total) by mouth at bedtime. 30 tablet 2   fluconazole (DIFLUCAN) 150 MG tablet Take 1 tablet (150 mg total) by mouth daily. May repeat in 1 week if still having symptoms. 2 tablet 0   lamoTRIgine (LAMICTAL) 25 MG tablet Take 4 tablets (100 mg total) by mouth 2 (two) times daily. Week 1 25 mg bid po, Week 2- 25 mg in AM and 50 mg in PM, Week 3- 50 mg bid po, Week 4- 75 mg bid po, Week 5 100 mg bid po 180 tablet 0   ondansetron (ZOFRAN) 4 MG tablet Take one tablet by mouth once daily, as needed, for nausea 30 tablet 2   pantoprazole (PROTONIX) 20 MG tablet Take 1 tablet by mouth once daily 30 tablet 5   rizatriptan (MAXALT-MLT) 10 MG disintegrating tablet Take 1 tablet (10 mg total) by mouth as needed for migraine. May repeat in 2 hours if needed 10 tablet 3   spironolactone (ALDACTONE) 100 MG tablet Take 1 tablet by mouth once daily 30 tablet 0   topiramate (TOPAMAX) 100 MG tablet Take 100 mg by mouth 2 (two) times daily.     No current facility-administered medications for this visit.    Allergies as of 07/18/2022 - Review Complete 07/15/2022  Allergen Reaction Noted   Ketorolac tromethamine Hives  03/16/2009    Family History  Problem Relation Age of Onset   Hypertension Mother    Diabetes Mother    Drug abuse Mother    Hypertension Father    Hypertension Sister    Hypertension Sister    Colon cancer Neg Hx     Social History   Socioeconomic History   Marital status: Single    Spouse name: Not on file   Number of children: 2   Years of education: 12   Highest education level: Not on file  Occupational History   Not on file  Tobacco Use   Smoking status: Never   Smokeless tobacco: Never  Vaping Use  Vaping Use: Never used  Substance and Sexual Activity   Alcohol use: No   Drug use: No   Sexual activity: Yes    Birth control/protection: Condom  Other Topics Concern   Not on file  Social History Narrative   Right handed   Caffeine use: tea/soda twice weekly   Social Determinants of Health   Financial Resource Strain: Not on file  Food Insecurity: Not on file  Transportation Needs: Not on file  Physical Activity: Not on file  Stress: Not on file  Social Connections: Not on file    Review of Systems: Gen: Denies fever, chills, cold or flu like symptoms, pre-syncope, or syncope.  CV: Denies chest pain, palpitations. Resp: Denies dyspnea, cough.  GI: See HPI Heme: See HPI  Physical Exam: There were no vitals taken for this visit. General:   Alert and oriented. No distress noted. Pleasant and cooperative.  Head:  Normocephalic and atraumatic. Eyes:  Conjuctiva clear without scleral icterus. Heart:  S1, S2 present without murmurs appreciated. Lungs:  Clear to auscultation bilaterally. No wheezes, rales, or rhonchi. No distress.  Abdomen:  +BS, soft, non-tender and non-distended. No rebound or guarding. No HSM or masses noted. Msk:  Symmetrical without gross deformities. Normal posture. Extremities:  Without edema. Neurologic:  Alert and  oriented x4 Psych:  Normal mood and affect.    Assessment:     Plan:  ***   Aliene Altes,  PA-C [facility] 07/18/2022

## 2022-07-17 ENCOUNTER — Ambulatory Visit (INDEPENDENT_AMBULATORY_CARE_PROVIDER_SITE_OTHER): Payer: Managed Care, Other (non HMO) | Admitting: Family Medicine

## 2022-07-17 ENCOUNTER — Institutional Professional Consult (permissible substitution): Payer: Managed Care, Other (non HMO) | Admitting: Pulmonary Disease

## 2022-07-17 DIAGNOSIS — F322 Major depressive disorder, single episode, severe without psychotic features: Secondary | ICD-10-CM | POA: Diagnosis not present

## 2022-07-17 DIAGNOSIS — F411 Generalized anxiety disorder: Secondary | ICD-10-CM | POA: Diagnosis not present

## 2022-07-17 NOTE — Telephone Encounter (Signed)
scheduled

## 2022-07-17 NOTE — Telephone Encounter (Signed)
She will need a phone visit scheduled to determine what kind of fmla leave she is wanting

## 2022-07-17 NOTE — Progress Notes (Signed)
Virtual Visit via Telephone Note  I connected with Olivia Woodward on 07/17/22 at  2:00 PM EDT by telephone and verified that I am speaking with the correct person using two identifiers.  Location: Patient: home Provider: office   I discussed the limitations, risks, security and privacy concerns of performing an evaluation and management service by telephone and the availability of in person appointments. I also discussed with the patient that there may be a patient responsible charge related to this service. The patient expressed understanding and agreed to proceed.   History of Present Illness: Feels overwhelmed as if she isgoing to lose it,. Requesting time out of work Being treated by Psychiatry for years  Decompensating in recent times primarily due to several new physical diagnoses , along with underlying lefe stresses PHQ 9  score of 24 on treatment, not suicidal or homicidal GAD score of 18 on treatment   Observations/Objective: There were no vitals taken for this visit. Good communication with no confusion and intact memory. Alert and oriented x 3 No signs of respiratory distress during speech   Assessment and Plan: Depression, major, single episode, severe (HCC) Uncontrolled , severe, debilitating , unable to perform job Psychiatry will be aleted re current status to direct tretment and extent of debility and long er period out of work Score is 24 Work excuse from 09/01 to 07/29/2022, with urgernt referral back to Psych nd messagealso  GAD (generalized anxiety disorder) Work excuse and urgent psych re eval   Follow Up Instructions:    I discussed the assessment and treatment plan with the patient. The patient was provided an opportunity to ask questions and all were answered. The patient agreed with the plan and demonstrated an understanding of the instructions.   The patient was advised to call back or seek an in-person evaluation if the symptoms worsen or if the  condition fails to improve as anticipated.  I provided 14 minutes of non-face-to-face time during this encounter.   Tula Nakayama, MD

## 2022-07-17 NOTE — Patient Instructions (Addendum)
Work excuse from 09/01 to return 07/29/2022  I will be in contact with your Psychaitrist with current mental health status and request sooner appontment   with her to address the issues since you are being taken outbased on mental health status  Thanks for choosing [facility], we consider it a privelige to serve you.

## 2022-07-18 ENCOUNTER — Encounter: Payer: Self-pay | Admitting: General Surgery

## 2022-07-18 ENCOUNTER — Ambulatory Visit (INDEPENDENT_AMBULATORY_CARE_PROVIDER_SITE_OTHER): Payer: Managed Care, Other (non HMO) | Admitting: Urology

## 2022-07-18 ENCOUNTER — Encounter: Payer: Self-pay | Admitting: Gastroenterology

## 2022-07-18 ENCOUNTER — Ambulatory Visit (INDEPENDENT_AMBULATORY_CARE_PROVIDER_SITE_OTHER): Payer: Managed Care, Other (non HMO) | Admitting: Gastroenterology

## 2022-07-18 ENCOUNTER — Encounter: Payer: Self-pay | Admitting: Urology

## 2022-07-18 ENCOUNTER — Ambulatory Visit (INDEPENDENT_AMBULATORY_CARE_PROVIDER_SITE_OTHER): Payer: Managed Care, Other (non HMO) | Admitting: General Surgery

## 2022-07-18 VITALS — BP 121/80 | HR 87 | Ht 66.0 in | Wt 231.0 lb

## 2022-07-18 VITALS — BP 134/89 | HR 94 | Temp 98.2°F | Ht 66.0 in | Wt 231.8 lb

## 2022-07-18 VITALS — BP 144/80 | HR 81 | Temp 97.5°F | Resp 12 | Ht 66.0 in | Wt 231.0 lb

## 2022-07-18 DIAGNOSIS — R6881 Early satiety: Secondary | ICD-10-CM

## 2022-07-18 DIAGNOSIS — N200 Calculus of kidney: Secondary | ICD-10-CM | POA: Diagnosis not present

## 2022-07-18 DIAGNOSIS — Z87440 Personal history of urinary (tract) infections: Secondary | ICD-10-CM

## 2022-07-18 DIAGNOSIS — Z09 Encounter for follow-up examination after completed treatment for conditions other than malignant neoplasm: Secondary | ICD-10-CM

## 2022-07-18 DIAGNOSIS — K649 Unspecified hemorrhoids: Secondary | ICD-10-CM

## 2022-07-18 DIAGNOSIS — K5900 Constipation, unspecified: Secondary | ICD-10-CM | POA: Diagnosis not present

## 2022-07-18 DIAGNOSIS — K219 Gastro-esophageal reflux disease without esophagitis: Secondary | ICD-10-CM

## 2022-07-18 DIAGNOSIS — R1011 Right upper quadrant pain: Secondary | ICD-10-CM | POA: Diagnosis not present

## 2022-07-18 DIAGNOSIS — K625 Hemorrhage of anus and rectum: Secondary | ICD-10-CM

## 2022-07-18 DIAGNOSIS — N132 Hydronephrosis with renal and ureteral calculous obstruction: Secondary | ICD-10-CM

## 2022-07-18 DIAGNOSIS — Q625 Duplication of ureter: Secondary | ICD-10-CM | POA: Diagnosis not present

## 2022-07-18 DIAGNOSIS — N1831 Chronic kidney disease, stage 3a: Secondary | ICD-10-CM

## 2022-07-18 MED ORDER — LINACLOTIDE 290 MCG PO CAPS: 290.0000 ug | ORAL_CAPSULE | Freq: Every day | ORAL | 5 refills | Status: DC

## 2022-07-18 MED ORDER — PANTOPRAZOLE SODIUM 40 MG PO TBEC: 40.0000 mg | DELAYED_RELEASE_TABLET | Freq: Every day | ORAL | 3 refills | Status: DC

## 2022-07-18 MED ORDER — HYDROCORTISONE (PERIANAL) 2.5 % EX CREA: 1.0000 | TOPICAL_CREAM | Freq: Two times a day (BID) | CUTANEOUS | 1 refills | Status: DC

## 2022-07-18 NOTE — Progress Notes (Signed)
Subjective: 1. Renal stones   2. Hydronephrosis with urinary obstruction due to renal calculus   3. Duplicated left renal collecting system   4. Personal history of urinary infection      Consult requested by Dr. Tula Nakayama.  Olivia Woodward is a 56 yo female who is sent by Dr. Moshe Cipro for a 2.8cm stone with obstruction of the LLP of a duplicated system.  A few other stones are noted in the upper pole as well.  There is marked cortical atrophy.  This was seen on CT's from 05/30/22 and 06/21/22.  She had a cholecystectomy on 05/27/22.  She was back in the ER on 8/4 and was found to have a small pulmonary embolus and is currently on Eliquis.  She was diagnosed with a UTI as well and was given Keflex.  She had staph on a culture on 06/21/22 and also in 12/25/16.   I don't find prior renal imaging for full comparison.  She is on Topomax.   She had a gastric bypass in 2007.  She has no prior history of stones.  She hasn't had many UTI's.  She has had some left flank pain for about 2 months.  The pain was sharp with nausea.  She is feeling ok today. She has no significant voiding complaints.    ROS:  Review of Systems  Constitutional:        Night sweats  Respiratory:  Positive for cough.   Cardiovascular:  Positive for leg swelling (bilateral mild).  Gastrointestinal:  Positive for constipation.  Neurological:  Positive for headaches.  Psychiatric/Behavioral:  Positive for depression. The patient is nervous/anxious.     Allergies  Allergen Reactions   Ketorolac Tromethamine Hives    Lips swelled    Past Medical History:  Diagnosis Date   Anemia    Anxiety    Phreesia 03/18/2021   Depression    Phreesia 03/18/2021   Generalized headaches    Hypertension     Past Surgical History:  Procedure Laterality Date   ABDOMINAL HYSTERECTOMY     fibroids, partial   BREAST REDUCTION SURGERY  2005   CHOLECYSTECTOMY N/A 05/27/2022   Procedure: LAPAROSCOPIC CHOLECYSTECTOMY;  Surgeon: Aviva Signs, MD;   Location: [facility];  Service: General;  Laterality: N/A;   COLONOSCOPY WITH PROPOFOL N/A 05/10/2019   Two 5 to 6 mm polyps in the sigmoid colon and in the ascending colon, removed with a   GASTRIC BYPASS  2007   POLYPECTOMY  05/10/2019   Procedure: POLYPECTOMY;  Surgeon: Daneil Dolin, MD;  Location: [facility];  Service: Endoscopy;;  colon    Social History   Socioeconomic History   Marital status: Single    Spouse name: Not on file   Number of children: 2   Years of education: 12   Highest education level: Not on file  Occupational History   Not on file  Tobacco Use   Smoking status: Never   Smokeless tobacco: Never  Vaping Use   Vaping Use: Never used  Substance and Sexual Activity   Alcohol use: No   Drug use: No   Sexual activity: Yes    Birth control/protection: Condom  Other Topics Concern   Not on file  Social History Narrative   Right handed   Caffeine use: tea/soda twice weekly   Social Determinants of Health   Financial Resource Strain: Not on file  Food Insecurity: Not on file  Transportation Needs: Not on file  Physical Activity: Not on  file  Stress: Not on file  Social Connections: Not on file  Intimate Partner Violence: Not on file    Family History  Problem Relation Age of Onset   Hypertension Mother    Diabetes Mother    Drug abuse Mother    Hypertension Father    Hypertension Sister    Hypertension Sister    Colon cancer Neg Hx     Anti-infectives: Anti-infectives (From admission, onward)    None       Current Outpatient Medications  Medication Sig Dispense Refill   [START ON 08/02/2022] ALPRAZolam (XANAX) 0.25 MG tablet Take 1 tablet (0.25 mg total) by mouth 2 (two) times daily as needed. for anxiety 60 tablet 0   amLODipine (NORVASC) 10 MG tablet Take 1 tablet (10 mg total) by mouth daily. 90 tablet 1   apixaban (ELIQUIS) 5 MG TABS tablet Take 1 tablet (5 mg total) by mouth 2 (two) times daily. 60 tablet 3   cariprazine  (VRAYLAR) 3 MG capsule Take 1 capsule (3 mg total) by mouth daily. 30 capsule 1   Cyanocobalamin (VITAMIN B12) 1000 MCG TBCR Take 2 tablets by mouth once daily 180 tablet 2   cyclobenzaprine (FLEXERIL) 10 MG tablet Take one tablet by mouth twice daily for 5 days , then as needed, for neck spasm 30 tablet 0   ergocalciferol (VITAMIN D2) 1.25 MG (50000 UT) capsule Take 1 capsule (50,000 Units total) by mouth once a week. One capsule once weekly 12 capsule 2   escitalopram (LEXAPRO) 20 MG tablet Take 1 tablet (20 mg total) by mouth daily. 30 tablet 2   Eszopiclone 3 MG TABS Take 1 tablet (3 mg total) by mouth at bedtime. 30 tablet 2   hydrocortisone (ANUSOL-HC) 2.5 % rectal cream Place 1 Application rectally 2 (two) times daily. 30 g 1   lamoTRIgine (LAMICTAL) 25 MG tablet Take 4 tablets (100 mg total) by mouth 2 (two) times daily. Week 1 25 mg bid po, Week 2- 25 mg in AM and 50 mg in PM, Week 3- 50 mg bid po, Week 4- 75 mg bid po, Week 5 100 mg bid po 180 tablet 0   linaclotide (LINZESS) 290 MCG CAPS capsule Take 1 capsule (290 mcg total) by mouth daily before breakfast. 30 capsule 5   ondansetron (ZOFRAN) 4 MG tablet Take one tablet by mouth once daily, as needed, for nausea 30 tablet 2   pantoprazole (PROTONIX) 40 MG tablet Take 1 tablet (40 mg total) by mouth daily before breakfast. 30 tablet 3   rizatriptan (MAXALT-MLT) 10 MG disintegrating tablet Take 1 tablet (10 mg total) by mouth as needed for migraine. May repeat in 2 hours if needed 10 tablet 3   spironolactone (ALDACTONE) 100 MG tablet Take 1 tablet by mouth once daily 30 tablet 0   topiramate (TOPAMAX) 100 MG tablet Take 100 mg by mouth 2 (two) times daily.     No current facility-administered medications for this visit.     Objective: Vital signs in last 24 hours: BP 121/80   Pulse 87   Ht '5\' 6"'$  (1.676 m)   Wt 231 lb (104.8 kg)   BMI 37.28 kg/m   Intake/Output from previous day: No intake/output data recorded. Intake/Output  this shift: '@IOTHISSHIFT'$ @   Physical Exam Vitals reviewed.  Constitutional:      Appearance: Normal appearance. She is obese.  Cardiovascular:     Rate and Rhythm: Normal rate and regular rhythm.     Heart sounds:  Normal heart sounds.  Pulmonary:     Effort: Pulmonary effort is normal. No respiratory distress.     Breath sounds: Normal breath sounds.  Abdominal:     Palpations: Abdomen is soft.     Tenderness: There is abdominal tenderness (right flank).  Musculoskeletal:        General: No swelling or tenderness. Normal range of motion.  Skin:    General: Skin is warm and dry.  Neurological:     General: No focal deficit present.     Mental Status: She is alert and oriented to person, place, and time.  Psychiatric:        Mood and Affect: Mood normal.        Behavior: Behavior normal.     Lab Results:  No results found for this or any previous visit (from the past 24 hour(s)).  BMET No results for input(s): "NA", "K", "CL", "CO2", "GLUCOSE", "BUN", "CREATININE", "CALCIUM" in the last 72 hours. PT/INR No results for input(s): "LABPROT", "INR" in the last 72 hours. ABG No results for input(s): "PHART", "HCO3" in the last 72 hours.  Invalid input(s): "PCO2", "PO2" Recent Results (from the past 2160 hour(s))  Surgical pathology     Status: None   Collection Time: 05/27/22 11:07 AM  Result Value Ref Range   SURGICAL PATHOLOGY      SURGICAL PATHOLOGY CASE: [id number] PATIENT: Eloise Harman Surgical Pathology Report     Clinical History: cholelithiasis     FINAL MICROSCOPIC DIAGNOSIS:  A. GALLBLADDER, CHOLECYSTECTOMY: Chronic cholecystitis with cholelithiasis   GROSS DESCRIPTION:  Size/?Intact: Received fresh is a disrupted gallbladder measuring 8.2 x 3.7 x 1.5 cm Serosal surface: Tan-green and smooth to mildly roughened Mucosa/Wall: Green and velvety without distinct lesions and a wall thickness of 0.2 cm Contents: Green viscous bile and multiple  irregularly shaped, black, roughened calculi ranging from 0.1 to 0.5 cm Cystic duct: Probed and found to be patent Block Summary: The cystic margin and representative wall are submitted in 1 block Amparo Bristol, 05/27/2022)     Final Diagnosis performed by Tobin Chad, MD.   Electronically signed 05/28/2022 Technical component performed at [facility], [place] [address] [place], [place] [address].  Professional compon ent performed at [employer]. [facility], [place] [address], [place], [place] [address].  Immunohistochemistry Technical component (if applicable) was performed at [facility]. [address] [place], [place], [place] [address].   IMMUNOHISTOCHEMISTRY DISCLAIMER (if applicable): Some of these immunohistochemical stains may have been developed and the performance characteristics determine by [facility]. Some may not have been cleared or approved by the U.S. Food and Drug Administration. The FDA has determined that such clearance or approval is not necessary. This test is used for clinical purposes. It should not be regarded as investigational or for research. This laboratory is certified under the [place] (CLIA-88) as qualified to perform high complexity clinical laboratory testing.  The controls stained appropriately.   Comprehensive metabolic panel     Status: Abnormal   Collection Time: 05/30/22 11:55 AM  Result Value Ref Range   Sodium 139 135 - 145 mmol/L   Potassium 3.7 3.5 - 5.1 mmol/L   Chloride 104 98 - 111 mmol/L   CO2 30 22 - 32 mmol/L   Glucose, Bld 90 70 - 99 mg/dL    Comment: Glucose reference range applies only to samples taken after fasting for at least 8 hours.   BUN 10 6 -  20 mg/dL   Creatinine, Ser 1.08 (H) 0.44 - 1.00 mg/dL   Calcium 8.7 (L) 8.9 - 10.3 mg/dL   Total Protein 7.2 6.5 - 8.1 g/dL   Albumin 3.5 3.5 - 5.0 g/dL   AST 46 (H) 15 - 41 U/L   ALT  29 0 - 44 U/L   Alkaline Phosphatase 36 (L) 38 - 126 U/L   Total Bilirubin 1.7 (H) 0.3 - 1.2 mg/dL   GFR, Estimated >60 >60 mL/min    Comment: (NOTE) Calculated using the CKD-EPI Creatinine Equation (2021)    Anion gap 5 5 - 15    Comment: Performed at [facility], [address], [place], [place] [address]  CBC with Differential     Status: Abnormal   Collection Time: 05/30/22 11:55 AM  Result Value Ref Range   WBC 6.2 4.0 - 10.5 K/uL   RBC 4.09 3.87 - 5.11 MIL/uL   Hemoglobin 13.2 12.0 - 15.0 g/dL   HCT 42.4 36.0 - 46.0 %   MCV 103.7 (H) 80.0 - 100.0 fL   MCH 32.3 26.0 - 34.0 pg   MCHC 31.1 30.0 - 36.0 g/dL   RDW 12.5 11.5 - 15.5 %   Platelets 285 150 - 400 K/uL   nRBC 0.0 0.0 - 0.2 %   Neutrophils Relative % 62 %   Neutro Abs 3.8 1.7 - 7.7 K/uL   Lymphocytes Relative 26 %   Lymphs Abs 1.6 0.7 - 4.0 K/uL   Monocytes Relative 10 %   Monocytes Absolute 0.6 0.1 - 1.0 K/uL   Eosinophils Relative 2 %   Eosinophils Absolute 0.2 0.0 - 0.5 K/uL   Basophils Relative 0 %   Basophils Absolute 0.0 0.0 - 0.1 K/uL   Immature Granulocytes 0 %   Abs Immature Granulocytes 0.02 0.00 - 0.07 K/uL    Comment: Performed at [facility], [address] [place], [place] [address]  Lipase, blood     Status: None   Collection Time: 05/30/22 11:55 AM  Result Value Ref Range   Lipase 21 11 - 51 U/L    Comment: Performed at [facility], [address], [place], [place] [address]  Troponin I (High Sensitivity)     Status: None   Collection Time: 05/30/22 11:55 AM  Result Value Ref Range   Troponin I (High Sensitivity) 3 <18 ng/L    Comment: (NOTE) Elevated high sensitivity troponin I (hsTnI) values and significant  changes across serial measurements may suggest ACS but many other  chronic and acute conditions are known to elevate hsTnI results.  Refer to the "Links" section for chest pain algorithms and additional  guidance. Performed at [facility], [address] [place], [place] [address]    Brain natriuretic peptide     Status: None   Collection Time: 05/30/22 11:55 AM  Result Value Ref Range   B Natriuretic Peptide 32.0 0.0 - 100.0 pg/mL    Comment: Performed at [facility], [address], [place],  [address]  Troponin I (High Sensitivity)     Status: None   Collection Time: 05/30/22  1:40 PM  Result Value Ref Range   Troponin I (High Sensitivity) 3 <18 ng/L    Comment: (NOTE) Elevated high sensitivity troponin I (hsTnI) values and significant  changes across serial measurements may suggest ACS but many other  chronic and acute conditions are known to elevate hsTnI results.  Refer to the "Links" section for chest pain algorithms and additional  guidance. Performed at  [facility], [address] [place], [place] [address]   Hepatitis panel, acute     Status: None   Collection Time: 05/30/22  7:02 PM  Result Value Ref Range   Hepatitis B Surface Ag NON REACTIVE NON REACTIVE   HCV Ab NON REACTIVE NON REACTIVE    Comment: (NOTE) Nonreactive HCV antibody screen is consistent with no HCV infections,  unless recent infection is suspected or other evidence exists to indicate HCV infection.     Hep A IgM NON REACTIVE NON REACTIVE   Hep B C IgM NON REACTIVE NON REACTIVE    Comment: Performed at [facility], [place] [address] [place], [place] [address]  Urinalysis, Routine w reflex microscopic Urine, Clean Catch     Status: Abnormal   Collection Time: 06/21/22  6:28 PM  Result Value Ref Range   Color, Urine AMBER (A) YELLOW    Comment: BIOCHEMICALS MAY BE AFFECTED BY COLOR   APPearance CLOUDY (A) CLEAR   Specific Gravity, Urine >1.046 (H) 1.005 - 1.030   pH 6.0 5.0 - 8.0   Glucose, UA NEGATIVE NEGATIVE mg/dL   Hgb urine dipstick SMALL (A) NEGATIVE   Bilirubin Urine NEGATIVE NEGATIVE   Ketones, ur NEGATIVE NEGATIVE mg/dL   Protein, ur 100 (A) NEGATIVE mg/dL   Nitrite NEGATIVE NEGATIVE   Leukocytes,Ua LARGE (A) NEGATIVE   RBC / HPF >50 (H) 0 - 5 RBC/hpf    WBC, UA >50 (H) 0 - 5 WBC/hpf   Bacteria, UA FEW (A) NONE SEEN   Squamous Epithelial / LPF 0-5 0 - 5   WBC Clumps PRESENT    Mucus PRESENT     Comment: Performed at [facility], [address], [place], [place] [address]  Lipase, blood     Status: None   Collection Time: 06/21/22  7:09 PM  Result Value Ref Range   Lipase 29 11 - 51 U/L    Comment: Performed at [facility], [address], [place], [place] [address]  Comprehensive metabolic panel     Status: Abnormal   Collection Time: 06/21/22  7:09 PM  Result Value Ref Range   Sodium 139 135 - 145 mmol/L   Potassium 4.0 3.5 - 5.1 mmol/L   Chloride 107 98 - 111 mmol/L   CO2 22 22 - 32 mmol/L   Glucose, Bld 106 (H) 70 - 99 mg/dL    Comment: Glucose reference range applies only to samples taken after fasting for at least 8 hours.   BUN 13 6 - 20 mg/dL   Creatinine, Ser 1.18 (H) 0.44 - 1.00 mg/dL   Calcium 9.8 8.9 - 10.3 mg/dL   Total Protein 8.5 (H) 6.5 - 8.1 g/dL   Albumin 4.3 3.5 - 5.0 g/dL   AST 13 (L) 15 - 41 U/L   ALT 9 0 - 44 U/L   Alkaline Phosphatase 37 (L) 38 - 126 U/L   Total Bilirubin 1.2 0.3 - 1.2 mg/dL   GFR, Estimated 55 (L) >60 mL/min    Comment: (NOTE) Calculated using the CKD-EPI Creatinine Equation (2021)    Anion gap 10 5 - 15    Comment: Performed at [facility], [address] [place], [place] [address]  CBC     Status: Abnormal   Collection Time: 06/21/22  7:09 PM  Result Value Ref Range   WBC 4.9 4.0 - 10.5 K/uL   RBC 5.74 (H) 3.87 - 5.11 MIL/uL   Hemoglobin 18.4 (H) 12.0 - 15.0 g/dL  HCT 58.6 (H) 36.0 - 46.0 %   MCV 102.1 (H) 80.0 - 100.0 fL   MCH 32.1 26.0 - 34.0 pg   MCHC 31.4 30.0 - 36.0 g/dL   RDW 12.4 11.5 - 15.5 %   Platelets 207 150 - 400 K/uL   nRBC 0.0 0.0 - 0.2 %    Comment: Performed at [facility], [address], [place], [place] [address]  Urine Culture     Status: Abnormal   Collection Time: 06/21/22  9:37 PM   Specimen: Urine, Clean Catch  Result Value Ref Range    Specimen Description      URINE, CLEAN CATCH Performed at [facility], [address], [place], [place] [address]    Special Requests      NONE Performed at [facility], [address] [place] [address] [place], [place] [address]    Culture 50,000 COLONIES/mL STAPHYLOCOCCUS AUREUS (A)    Report Status 06/25/2022 FINAL    Organism ID, Bacteria STAPHYLOCOCCUS AUREUS (A)       Susceptibility   Staphylococcus aureus - MIC*    CIPROFLOXACIN <=0.5 SENSITIVE Sensitive     GENTAMICIN <=0.5 SENSITIVE Sensitive     NITROFURANTOIN <=16 SENSITIVE Sensitive     OXACILLIN <=0.25 SENSITIVE Sensitive     TETRACYCLINE 2 SENSITIVE Sensitive     VANCOMYCIN <=0.5 SENSITIVE Sensitive     TRIMETH/SULFA <=10 SENSITIVE Sensitive     CLINDAMYCIN RESISTANT Resistant     RIFAMPIN <=0.5 SENSITIVE Sensitive     Inducible Clindamycin POSITIVE Resistant     * 50,000 COLONIES/mL STAPHYLOCOCCUS AUREUS   07/18/22: UA today has pyuria and bacteriuria.   Studies/Results: No results found. CT Angio Chest PE W and/or Wo Contrast  Result Date: 06/21/2022 CLINICAL DATA:  Pulmonary embolism suspected, high probability. Back pain radiating to stomach with shortness of breath. EXAM: CT ANGIOGRAPHY CHEST CT ABDOMEN AND PELVIS WITH CONTRAST TECHNIQUE: Multidetector CT imaging of the chest was performed using the standard protocol during bolus administration of intravenous contrast. Multiplanar CT image reconstructions and MIPs were obtained to evaluate the vascular anatomy. Multidetector CT imaging of the abdomen and pelvis was performed using the standard protocol during bolus administration of intravenous contrast. RADIATION DOSE REDUCTION: This exam was performed according to the departmental dose-optimization program which includes automated exposure control, adjustment of the mA and/or kV according to patient size and/or use of iterative reconstruction technique. CONTRAST:  190m OMNIPAQUE IOHEXOL 350 MG/ML SOLN COMPARISON:  05/30/2022.  FINDINGS: CTA CHEST FINDINGS Cardiovascular: The heart is normal in size and there is a trace pericardial effusion. Scattered coronary artery calcifications are noted. The aorta is normal in caliber. The pulmonary trunk is borderline distended. A subsegmental pulmonary artery filling defect is present in the left lower lobe, coronal image 112. Evaluation is limited due to infiltrates at the lung bases. No evidence of right heart strain. Mediastinum/Nodes: No mediastinal or hilar lymphadenopathy. Prominent lymph nodes are noted in the subpectoral regions bilaterally measuring 9 mm on the right and 1.3 cm on the left. Rim calcified nodules are noted in the thyroid gland. The thyroid gland and esophagus are within normal limits. There is a small hiatal hernia. Lungs/Pleura: Lung volumes are low and there is strandy atelectasis or infiltrate at the lung bases. No effusion or pneumothorax. Musculoskeletal: Degenerative changes are present in the thoracic spine. No acute osseous abnormality. Review of the MIP images confirms the above findings. CT ABDOMEN and PELVIS FINDINGS Hepatobiliary: No focal liver abnormality. No biliary ductal dilatation. The gallbladder  is surgically absent and there is a small amount of residual fluid at the gallbladder fossa measuring 2.6 x 1.1 cm, improved from the prior exam. No air or abnormal enhancement is seen. Pancreas: Unremarkable. No pancreatic ductal dilatation or surrounding inflammatory changes. Spleen: The spleen is normal in size. There is an ill-defined subcentimeter hypodensity in the spleen measuring 8 mm, possible cyst or hemangioma. Adrenals/Urinary Tract: There is a nodule in the right adrenal gland measuring 1.9 cm with indeterminate imaging characteristics. The left adrenal gland is within normal limits. Cysts are noted in the kidneys bilaterally. A partially duplicated collecting system is noted on the left. Multiple renal calculi are noted in the lower pole the left  kidney. There is a 2.8 cm calculus in the lower pole collecting system resulting in severe obstructive uropathy with renal cortical thinning which is likely chronic. No renal calculus or obstructive uropathy on the right. The bladder is unremarkable. Stomach/Bowel: Gastric surgery changes are noted. Appendix appears normal. No evidence of bowel wall thickening, distention, or inflammatory changes. No free air or pneumatosis. Scattered diverticula are present along the colon without evidence of diverticulitis. Vascular/Lymphatic: No significant vascular findings. Prominent lymph nodes are noted in the periaortic space on the left at the level of the left kidney, along the iliac chains bilaterally and in the inguinal regions bilaterally. Reproductive: Status post hysterectomy. No adnexal masses. Other: No abdominopelvic ascites. Fat containing ventral abdominal wall hernias are noted in the midline. Musculoskeletal: Degenerative changes in the lumbar spine. No acute osseous abnormality. Review of the MIP images confirms the above findings. IMPRESSION: 1. Small subsegmental pulmonary embolus in the left lower lobe. No evidence of right heart strain. 2. Patchy atelectasis or infiltrate at the lung bases. 3. Status post cholecystectomy with a small amount of residual fluid in the gallbladder fossa measuring 2.6 x 1.1 cm. No residual air, fat stranding, or abnormal enhancement to suggest abscess. If there is clinical concern for biloma, consider HIDA scan for further evaluation. 4. 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 There is nephrolithiasis in the lower pole the left kidney with a 2.8 cm calculus in the lower pole renal pelvis resulting in severe obstructive uropathy with associated cortical thinning which is likely chronic and unchanged from the prior exam. 5. Gastric surgery changes with small hiatal hernia. 6. Stable indeterminate right adrenal nodule. Multiphase CT with adrenal protocol is  suggested for further characterization on follow-up. 7. Remaining incidental findings as described above. Critical findings were reported to Dr. Sabra Heck at 9:37 p.m. Electronically Signed   By: Brett Fairy M.D.   On: 06/21/2022 21:45   CT ABDOMEN PELVIS W CONTRAST  Result Date: 06/21/2022 CLINICAL DATA:  Pulmonary embolism suspected, high probability. Back pain radiating to stomach with shortness of breath. EXAM: CT ANGIOGRAPHY CHEST CT ABDOMEN AND PELVIS WITH CONTRAST TECHNIQUE: Multidetector CT imaging of the chest was performed using the standard protocol during bolus administration of intravenous contrast. Multiplanar CT image reconstructions and MIPs were obtained to evaluate the vascular anatomy. Multidetector CT imaging of the abdomen and pelvis was performed using the standard protocol during bolus administration of intravenous contrast. RADIATION DOSE REDUCTION: This exam was performed according to the departmental dose-optimization program which includes automated exposure control, adjustment of the mA and/or kV according to patient size and/or use of iterative reconstruction technique. CONTRAST:  143m OMNIPAQUE IOHEXOL 350 MG/ML SOLN COMPARISON:  05/30/2022. FINDINGS: CTA CHEST FINDINGS Cardiovascular: The heart is normal in size and there is a trace  pericardial effusion. Scattered coronary artery calcifications are noted. The aorta is normal in caliber. The pulmonary trunk is borderline distended. A subsegmental pulmonary artery filling defect is present in the left lower lobe, coronal image 112. Evaluation is limited due to infiltrates at the lung bases. No evidence of right heart strain. Mediastinum/Nodes: No mediastinal or hilar lymphadenopathy. Prominent lymph nodes are noted in the subpectoral regions bilaterally measuring 9 mm on the right and 1.3 cm on the left. Rim calcified nodules are noted in the thyroid gland. The thyroid gland and esophagus are within normal limits. There is a small  hiatal hernia. Lungs/Pleura: Lung volumes are low and there is strandy atelectasis or infiltrate at the lung bases. No effusion or pneumothorax. Musculoskeletal: Degenerative changes are present in the thoracic spine. No acute osseous abnormality. Review of the MIP images confirms the above findings. CT ABDOMEN and PELVIS FINDINGS Hepatobiliary: No focal liver abnormality. No biliary ductal dilatation. The gallbladder is surgically absent and there is a small amount of residual fluid at the gallbladder fossa measuring 2.6 x 1.1 cm, improved from the prior exam. No air or abnormal enhancement is seen. Pancreas: Unremarkable. No pancreatic ductal dilatation or surrounding inflammatory changes. Spleen: The spleen is normal in size. There is an ill-defined subcentimeter hypodensity in the spleen measuring 8 mm, possible cyst or hemangioma. Adrenals/Urinary Tract: There is a nodule in the right adrenal gland measuring 1.9 cm with indeterminate imaging characteristics. The left adrenal gland is within normal limits. Cysts are noted in the kidneys bilaterally. A partially duplicated collecting system is noted on the left. Multiple renal calculi are noted in the lower pole the left kidney. There is a 2.8 cm calculus in the lower pole collecting system resulting in severe obstructive uropathy with renal cortical thinning which is likely chronic. No renal calculus or obstructive uropathy on the right. The bladder is unremarkable. Stomach/Bowel: Gastric surgery changes are noted. Appendix appears normal. No evidence of bowel wall thickening, distention, or inflammatory changes. No free air or pneumatosis. Scattered diverticula are present along the colon without evidence of diverticulitis. Vascular/Lymphatic: No significant vascular findings. Prominent lymph nodes are noted in the periaortic space on the left at the level of the left kidney, along the iliac chains bilaterally and in the inguinal regions bilaterally.  Reproductive: Status post hysterectomy. No adnexal masses. Other: No abdominopelvic ascites. Fat containing ventral abdominal wall hernias are noted in the midline. Musculoskeletal: Degenerative changes in the lumbar spine. No acute osseous abnormality. Review of the MIP images confirms the above findings. IMPRESSION: 1. Small subsegmental pulmonary embolus in the left lower lobe. No evidence of right heart strain. 2. Patchy atelectasis or infiltrate at the lung bases. 3. Status post cholecystectomy with a small amount of residual fluid in the gallbladder fossa measuring 2.6 x 1.1 cm. No residual air, fat stranding, or abnormal enhancement to suggest abscess. If there is clinical concern for biloma, consider HIDA scan for further evaluation. 4. 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 There is nephrolithiasis in the lower pole the left kidney with a 2.8 cm calculus in the lower pole renal pelvis resulting in severe obstructive uropathy with associated cortical thinning which is likely chronic and unchanged from the prior exam. 5. Gastric surgery changes with small hiatal hernia. 6. Stable indeterminate right adrenal nodule. Multiphase CT with adrenal protocol is suggested for further characterization on follow-up. 7. Remaining incidental findings as described above. Critical findings were reported to Dr. Sabra Heck at 9:37 p.m. Electronically Signed  By: Brett Fairy M.D.   On: 06/21/2022 21:45   MR ABDOMEN MRCP W WO CONTAST  Result Date: 05/30/2022 CLINICAL DATA:  Right upper quadrant abdominal pain status post lap coli elevated LFTs. EXAM: MRI ABDOMEN WITHOUT AND WITH CONTRAST (INCLUDING MRCP) TECHNIQUE: Multiplanar multisequence MR imaging of the abdomen was performed both before and after the administration of intravenous contrast. Heavily T2-weighted images of the biliary and pancreatic ducts were obtained, and three-dimensional MRCP images were rendered by post processing. CONTRAST:  52m  GADAVIST GADOBUTROL 1 MMOL/ML IV SOLN COMPARISON:  CT abdomen pelvis May 30, 2022. FINDINGS: Lower chest: Small right pleural effusion with adjacent airspace consolidation. Tiny left pleural effusion with adjacent airspace consolidation. Hepatobiliary: No significant hepatic steatosis. No suspicious hepatic lesion. Mild periportal edema. Gallbladder surgically absent. Focal fluid and gas in the gallbladder fossa measures approximally 4.6 x 2.2 cm. Mild prominence of the biliary tree with the common duct measuring up to 7 mm in maximum diameter but with gentle tapering to the level of the ampulla and no choledocholithiasis identified. Pancreas: No pancreatic ductal dilation or evidence of acute inflammation. No suspicious pancreatic mass. 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 No splenomegaly. Adrenals/Urinary Tract: Enhancing 1.7 cm right adrenal lesion demonstrates loss of signal on out of phase imaging consistent with a benign adrenal adenoma, stable dating back to at least 2021 and requiring no independent imaging follow-up. Left adrenal gland appears normal. Large left renal calculus with findings of left lower pole segmental renal XGP. Bilateral fluid signal renal lesions measure up to 3.5 cm in the left kidney and do not demonstrate suspicious postcontrast enhancement consistent with benign renal cysts which do not require independent imaging follow-up. Stomach/Bowel: Prior Roux-en-Y gastric bypass. Mild wall thickening of the excluded stomach is similar prior. No pathologic dilation of large or small bowel in the abdomen. Vascular/Lymphatic: Normal caliber abdominal aorta. Prominent retroperitoneal lymph nodes measure up to 12 mm in the left periaortic station. Other:  Anterior abdominal wall fluid/stranding. Musculoskeletal: No suspicious bone lesions identified. IMPRESSION: 1. Surgical change of recent cholecystectomy with T1/T2 hyperintense  material/fluid and gas in the gallbladder fossa, favored to reflect postoperative change/Surgicel without drainable fluid collection/abscess. 2. Prominence of the biliary tree with the common duct measuring up to 7 mm gentle tapering to the level of the ampulla without choledocholithiasis or evidence of biliary injury. 3. Periportal edema is nonspecific but most commonly reflects either sequela of aggressive IV hydration or hepatitis. 4. Right-greater-than-left pleural effusions with bibasilar airspace disease reflecting atelectasis or infiltrate. 5. Probable postsurgical fluid in the anterior abdominal wall recommend correlation with direct visualization for cellulitis. 6. Prominent retroperitoneal lymph nodes possibly reactive. Consider follow-up CT abdomen pelvis in 3 months to assess stability/resolution. 7. 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 Electronically Signed   By: JDahlia BailiffM.D.   On: 05/30/2022 17:43   MR 3D Recon At Scanner  Result Date: 05/30/2022 CLINICAL DATA:  Right upper quadrant abdominal pain status post lap coli elevated LFTs. EXAM: MRI ABDOMEN WITHOUT AND WITH CONTRAST (INCLUDING MRCP) TECHNIQUE: Multiplanar multisequence MR imaging of the abdomen was performed both before and after the administration of intravenous contrast. Heavily T2-weighted images of the biliary and pancreatic ducts were obtained, and three-dimensional MRCP images were rendered by post processing. CONTRAST:  190mGADAVIST GADOBUTROL 1 MMOL/ML IV SOLN COMPARISON:  CT abdomen pelvis May 30, 2022. FINDINGS:  Lower chest: Small right pleural effusion with adjacent airspace consolidation. Tiny left pleural effusion with adjacent airspace consolidation. Hepatobiliary: No significant hepatic steatosis. No suspicious hepatic lesion. Mild periportal edema. Gallbladder surgically absent. Focal fluid and gas in the gallbladder fossa measures  approximally 4.6 x 2.2 cm. Mild prominence of the biliary tree with the common duct measuring up to 7 mm in maximum diameter but with gentle tapering to the level of the ampulla and no choledocholithiasis identified. Pancreas: No pancreatic ductal dilation or evidence of acute inflammation. No suspicious pancreatic mass. 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 No splenomegaly. Adrenals/Urinary Tract: Enhancing 1.7 cm right adrenal lesion demonstrates loss of signal on out of phase imaging consistent with a benign adrenal adenoma, stable dating back to at least 2021 and requiring no independent imaging follow-up. Left adrenal gland appears normal. Large left renal calculus with findings of left lower pole segmental renal XGP. Bilateral fluid signal renal lesions measure up to 3.5 cm in the left kidney and do not demonstrate suspicious postcontrast enhancement consistent with benign renal cysts which do not require independent imaging follow-up. Stomach/Bowel: Prior Roux-en-Y gastric bypass. Mild wall thickening of the excluded stomach is similar prior. No pathologic dilation of large or small bowel in the abdomen. Vascular/Lymphatic: Normal caliber abdominal aorta. Prominent retroperitoneal lymph nodes measure up to 12 mm in the left periaortic station. Other:  Anterior abdominal wall fluid/stranding. Musculoskeletal: No suspicious bone lesions identified. IMPRESSION: 1. Surgical change of recent cholecystectomy with T1/T2 hyperintense material/fluid and gas in the gallbladder fossa, favored to reflect postoperative change/Surgicel without drainable fluid collection/abscess. 2. Prominence of the biliary tree with the common duct measuring up to 7 mm gentle tapering to the level of the ampulla without choledocholithiasis or evidence of biliary injury. 3. Periportal edema is nonspecific but most commonly reflects either sequela of aggressive IV hydration or  hepatitis. 4. Right-greater-than-left pleural effusions with bibasilar airspace disease reflecting atelectasis or infiltrate. 5. Probable postsurgical fluid in the anterior abdominal wall recommend correlation with direct visualization for cellulitis. 6. Prominent retroperitoneal lymph nodes possibly reactive. Consider follow-up CT abdomen pelvis in 3 months to assess stability/resolution. 7. 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 Electronically Signed   By: Dahlia Bailiff M.D.   On: 05/30/2022 17:43   CT Angio Chest PE W/Cm &/Or Wo Cm  Addendum Date: 05/30/2022   ADDENDUM REPORT: 05/30/2022 15:22 ADDENDUM: These results were called by telephone at the time of interpretation on 05/30/2022 at 3:22 pm to provider [facility] , who verbally acknowledged these results. Electronically Signed   By: Zetta Bills M.D.   On: 05/30/2022 15:22   Result Date: 05/30/2022 CLINICAL DATA:  Pulmonary embolism suspected in a 56 year old female also with abdominal pain post cholecystectomy. EXAM: CT ANGIOGRAPHY CHEST CT ABDOMEN AND PELVIS WITH CONTRAST TECHNIQUE: Multidetector CT imaging of the chest was performed using the standard protocol during bolus administration of intravenous contrast. Multiplanar CT image reconstructions and MIPs were obtained to evaluate the vascular anatomy. Multidetector CT imaging of the abdomen and pelvis was performed using the standard protocol during bolus administration of intravenous contrast. RADIATION DOSE REDUCTION: This exam was performed according to the departmental dose-optimization program which includes automated exposure control, adjustment of the mA and/or kV according to patient size and/or use of iterative reconstruction technique. CONTRAST:  140m OMNIPAQUE IOHEXOL 350 MG/ML SOLN COMPARISON:  Imaging from October 10, 2020.  FINDINGS: CTA CHEST FINDINGS Cardiovascular:  Aortic caliber is normal with  bovine arch. Heart size is top normal without pericardial effusion or sign of pericardial nodularity. Central pulmonary arteries are opacified to 232 Hounsfield units adequate for assessment of the segmental level on today's study. Accounting for that limitation there is no evidence of pulmonary embolism. Mediastinum/Nodes: Mildly patulous esophagus similar to prior imaging. No stranding adjacent to the esophagus. Bilateral axillary lymph nodes, mildly prominent less than a cm and unchanged from previous imaging. LEFT thyroid lesion (image 18/5) 18 mm with generalized heterogeneity but without enlargement of the thyroid otherwise. No mediastinal adenopathy.  No hilar adenopathy. Lungs/Pleura: Volume loss with low lung volumes. Small RIGHT-sided pleural effusion. Airspace disease greatest at the RIGHT lung base and RIGHT hemidiaphragm elevated greater than LEFT. Lung volumes are similar to previous imaging. Airspace disease is increased compared to prior imaging. Musculoskeletal: No chest wall lesion. No acute bone finding. No acute bone finding or destructive bone process about the bony thorax. Review of the MIP images confirms the above findings. CT ABDOMEN and PELVIS FINDINGS Hepatobiliary: Post cholecystectomy. Low attenuation in the gallbladder fossa a mixed with gas measuring 3.7 x 2.0 cm. Signs of intrahepatic biliary duct distension. Mild extrahepatic biliary duct distension. Common bile duct was nondilated on previous imaging. Lobular hepatic contours may be indicative of liver disease. The portal vein is patent. Pancreas: Normal, without mass, inflammation or ductal dilatation. Spleen: Normal. Adrenals/Urinary Tract: RIGHT adrenal lesion measuring 19 x 11 mm is homogeneous with density of 40 Hounsfield units. LEFT adrenal gland is normal. Low-attenuation lesions in the RIGHT kidney largest nearly a cm at 16 Hounsfield units compatible with a cyst on the RIGHT. Mild striated nephrogram. LEFT kidney with  large intrarenal calculus and segmental dilation of calices with "fragmented calculus withw scattered calcific fragments extending into the lower pole collecting systems. One extends beyond the confines of the lower pole calyx. There is little functional renal parenchyma remaining overlying lower pole and anterior interpolar collecting system elements. Benign cysts are present elsewhere in the LEFT kidney. The large calculus measures 2.5 x 1.7 cm. 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 Smooth contour of the urinary bladder. No RIGHT-sided hydronephrosis. Stomach/Bowel: Edema in the excluded stomach. 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 The appendix is normal. Colon is largely stool filled. Vascular/Lymphatic: Retroperitoneal lymph nodes with mild enlargement particularly on the LEFT (image 48/3) 12 mm scattered smaller lymph nodes elsewhere in the retroperitoneum. No frank upper abdominal lymphadenopathy. Pelvic adenopathy along the bilateral pelvic sidewall (image 84/3) 15 mm on the RIGHT and 14 mm on the LEFT. Aorta displays a normal caliber. Vascular structures in the abdomen are patent on venous phase assessment. IVC mildly engorged. RIGHT groin lymph node measuring 13 mm (image 97/3) Reproductive: Post hysterectomy without signs of adnexal mass. Other: Skin thickening across the anterior abdomen. Stranding across the anterior abdomen. Small amount of gas locules scattered about the abdomen mainly about the umbilicus and in the midline subcostal and RIGHT subcostal region. This may relate to recent laparoscopic access. The possibility of cellulitis is also considered. No discrete fluid collection in the anterior abdominal wall at this time. No pneumoperitoneum. No free pelvic fluid. Musculoskeletal: Changes in the body wall discussed above. No acute or destructive bone process. Review  of the MIP images confirms the above findings. IMPRESSION: 1. No signs of pulmonary embolism to the  segmental level. 2. Elevated RIGHT and LEFT hemidiaphragm RIGHT greater than LEFT with RIGHT-sided effusion and basilar airspace disease. Patchy atelectasis in the LEFT chest. 3. Low attenuation in the gallbladder fossa a mixed with gas, this likely reflects postoperative change given that Surgicel was placed in the gallbladder fossa during surgery. 4. Intrahepatic and extrahepatic biliary duct distension is mild-to-moderate not well evaluated on previous imaging but nondilated preoperatively. The possibility of choledocholithiasis is considered. Biliary injury could also potentially present with dilated biliary tree but there is no discrete transition point in the entire biliary tree is dilated on today's study. 5. Hepatic fissural widening and posterior hepatic notching, morphology suggest liver disease. 6. Gastric thickening similar to prior imaging in the excluded stomach could indicate gastritis but is unchanged. 7. RIGHT adrenal lesion measuring 19 x 11 mm is stable since 5465 and almost certainly a benign adenoma. Consider 1 year follow-up with adrenal protocol CT. 8. 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 No current substantial surrounding stranding. Consider correlation with urinalysis as this pattern of disease is associated with urinary tract infection. There is also mild striated nephrogram on the RIGHT. 9. Skin thickening across the anterior abdominal wall with small amounts of gas may be postoperative but would correlate with signs of cellulitis. 10. Bilateral pelvic sidewall lymph nodes with enlargement and LEFT para-aortic lymph nodes with enlargement, these could be reactive though the position of pelvic sidewall lymph nodes is unusual in that there is no sign  currently of pelvic inflammation. In the absence of history of malignancy or clinical signs of lymphoproliferative disorder would suggest comparison with prior imaging and or 3 month follow-up. Electronically Signed: By: Zetta Bills M.D. On: 05/30/2022 15:16   CT Abdomen Pelvis W Contrast  Addendum Date: 05/30/2022   ADDENDUM REPORT: 05/30/2022 15:22 ADDENDUM: These results were called by telephone at the time of interpretation on 05/30/2022 at 3:22 pm to provider [facility] , who verbally acknowledged these results. Electronically Signed   By: Zetta Bills M.D.   On: 05/30/2022 15:22   Result Date: 05/30/2022 CLINICAL DATA:  Pulmonary embolism suspected in a 56 year old female also with abdominal pain post cholecystectomy. EXAM: CT ANGIOGRAPHY CHEST CT ABDOMEN AND PELVIS WITH CONTRAST TECHNIQUE: Multidetector CT imaging of the chest was performed using the standard protocol during bolus administration of intravenous contrast. Multiplanar CT image reconstructions and MIPs were obtained to evaluate the vascular anatomy. Multidetector CT imaging of the abdomen and pelvis was performed using the standard protocol during bolus administration of intravenous contrast. RADIATION DOSE REDUCTION: This exam was performed according to the departmental dose-optimization program which includes automated exposure control, adjustment of the mA and/or kV according to patient size and/or use of iterative reconstruction technique. CONTRAST:  116m OMNIPAQUE IOHEXOL 350 MG/ML SOLN COMPARISON:  Imaging from October 10, 2020. FINDINGS: CTA CHEST FINDINGS Cardiovascular:  Aortic caliber is normal with bovine arch. Heart size is top normal without pericardial effusion or sign of pericardial nodularity. Central pulmonary arteries are opacified to 232 Hounsfield units adequate for assessment of the segmental level on today's study. Accounting for that limitation there is no evidence of pulmonary embolism. Mediastinum/Nodes:  Mildly patulous esophagus similar to prior imaging. No stranding adjacent to the esophagus. Bilateral axillary lymph nodes, mildly prominent less than a cm and unchanged from previous imaging. LEFT thyroid lesion (image 18/5) 18 mm with generalized heterogeneity but without enlargement  of the thyroid otherwise. No mediastinal adenopathy.  No hilar adenopathy. Lungs/Pleura: Volume loss with low lung volumes. Small RIGHT-sided pleural effusion. Airspace disease greatest at the RIGHT lung base and RIGHT hemidiaphragm elevated greater than LEFT. Lung volumes are similar to previous imaging. Airspace disease is increased compared to prior imaging. Musculoskeletal: No chest wall lesion. No acute bone finding. No acute bone finding or destructive bone process about the bony thorax. Review of the MIP images confirms the above findings. CT ABDOMEN and PELVIS FINDINGS Hepatobiliary: Post cholecystectomy. Low attenuation in the gallbladder fossa a mixed with gas measuring 3.7 x 2.0 cm. Signs of intrahepatic biliary duct distension. Mild extrahepatic biliary duct distension. Common bile duct was nondilated on previous imaging. Lobular hepatic contours may be indicative of liver disease. The portal vein is patent. Pancreas: Normal, without mass, inflammation or ductal dilatation. Spleen: Normal. Adrenals/Urinary Tract: RIGHT adrenal lesion measuring 19 x 11 mm is homogeneous with density of 40 Hounsfield units. LEFT adrenal gland is normal. Low-attenuation lesions in the RIGHT kidney largest nearly a cm at 16 Hounsfield units compatible with a cyst on the RIGHT. Mild striated nephrogram. LEFT kidney with large intrarenal calculus and segmental dilation of calices with "fragmented calculus withw scattered calcific fragments extending into the lower pole collecting systems. One extends beyond the confines of the lower pole calyx. There is little functional renal parenchyma remaining overlying lower pole and anterior interpolar  collecting system elements. Benign cysts are present elsewhere in the LEFT kidney. The large calculus measures 2.5 x 1.7 cm. 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 Smooth contour of the urinary bladder. No RIGHT-sided hydronephrosis. Stomach/Bowel: Edema in the excluded stomach. 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 The appendix is normal. Colon is largely stool filled. Vascular/Lymphatic: Retroperitoneal lymph nodes with mild enlargement particularly on the LEFT (image 48/3) 12 mm scattered smaller lymph nodes elsewhere in the retroperitoneum. No frank upper abdominal lymphadenopathy. Pelvic adenopathy along the bilateral pelvic sidewall (image 84/3) 15 mm on the RIGHT and 14 mm on the LEFT. Aorta displays a normal caliber. Vascular structures in the abdomen are patent on venous phase assessment. IVC mildly engorged. RIGHT groin lymph node measuring 13 mm (image 97/3) Reproductive: Post hysterectomy without signs of adnexal mass. Other: Skin thickening across the anterior abdomen. Stranding across the anterior abdomen. Small amount of gas locules scattered about the abdomen mainly about the umbilicus and in the midline subcostal and RIGHT subcostal region. This may relate to recent laparoscopic access. The possibility of cellulitis is also considered. No discrete fluid collection in the anterior abdominal wall at this time. No pneumoperitoneum. No free pelvic fluid. Musculoskeletal: Changes in the body wall discussed above. No acute or destructive bone process. Review of the MIP images confirms the above findings. IMPRESSION: 1. No signs of pulmonary embolism to the segmental level. 2. Elevated RIGHT and LEFT hemidiaphragm RIGHT greater than LEFT with RIGHT-sided effusion and basilar airspace disease. Patchy atelectasis in the LEFT chest. 3. Low attenuation in the gallbladder fossa a mixed  with gas, this likely reflects postoperative change given that Surgicel was placed in the gallbladder fossa during surgery. 4. Intrahepatic and extrahepatic biliary duct distension is mild-to-moderate not well evaluated on previous imaging but nondilated preoperatively. The possibility of choledocholithiasis is considered. Biliary injury could also potentially present with dilated biliary tree but there is no discrete transition point in the entire biliary tree is  dilated on today's study. 5. Hepatic fissural widening and posterior hepatic notching, morphology suggest liver disease. 6. Gastric thickening similar to prior imaging in the excluded stomach could indicate gastritis but is unchanged. 7. RIGHT adrenal lesion measuring 19 x 11 mm is stable since 4970 and almost certainly a benign adenoma. Consider 1 year follow-up with adrenal protocol CT. 8. 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 No current substantial surrounding stranding. Consider correlation with urinalysis as this pattern of disease is associated with urinary tract infection. There is also mild striated nephrogram on the RIGHT. 9. Skin thickening across the anterior abdominal wall with small amounts of gas may be postoperative but would correlate with signs of cellulitis. 10. Bilateral pelvic sidewall lymph nodes with enlargement and LEFT para-aortic lymph nodes with enlargement, these could be reactive though the position of pelvic sidewall lymph nodes is unusual in that there is no sign currently of pelvic inflammation. In the absence of history of malignancy or clinical signs of lymphoproliferative disorder would suggest comparison with prior imaging and or 3 month follow-up. Electronically Signed: By: Zetta Bills M.D. On: 05/30/2022 15:16   DG Chest Port 1 View  Result Date: 05/30/2022 CLINICAL DATA:  Severe  UPPER abdominal pain. EXAM: PORTABLE CHEST 1 VIEW COMPARISON:  10/04/2020 FINDINGS: Low lung volumes. There is dense opacity in the MEDIAL aspects both LOWER lobes. No evidence for pulmonary edema. IMPRESSION: 1. Low lung volumes. 2. Consolidations or infiltrates in the MEDIAL aspects of the LOWER lobes bilaterally. Electronically Signed   By: Nolon Nations M.D.   On: 05/30/2022 12:15   US Abdomen Limited RUQ (LIVER/GB)  Result Date: 04/24/2022 CLINICAL DATA:  Nausea EXAM: ULTRASOUND ABDOMEN LIMITED RIGHT UPPER QUADRANT COMPARISON:  Abdominal ultrasound 09/04/2009 FINDINGS: Gallbladder: Several dependent echogenic calculi. No wall thickening or pericholecystic fluid. Negative sonographic Murphy's sign. Common bile duct: Diameter: 4 mm Liver: No focal lesion identified. Within normal limits in parenchymal echogenicity. Portal vein is patent on color Doppler imaging with normal direction of blood flow towards the liver. Other: None. IMPRESSION: Cholelithiasis. Electronically Signed   By: Ofilia Neas M.D.   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  She will eventually need a PCNL but as she is on Eliquis for a PE, we may need to attempt stent placement into the lower pole to decompress the kidney until she can get off of the Eliquis in a few months.  UTI.  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   The stone is 600-1200HU which suggests a calcium stone over struvite.  She has no symptoms but the UA looks infection   CKD3a.  Cr was 1.18 with a GFR of 55 on 06/21/22.   No orders of the defined types were placed in this encounter.    Orders Placed This Encounter  Procedures   Urine Culture   Urinalysis, Routine w reflex microscopic     Return for I will contact her about next steps once I  communicate with one of our kidney surgeons. .    CC: Dr. Tula Nakayama.      Irine Seal 07/19/2022 [phone]

## 2022-07-18 NOTE — Progress Notes (Signed)
Subjective:     Olivia Woodward  Patient presents for follow-up to assess the small fluid collection on a postoperative CT scan in the gallbladder fossa.  She is status post laparoscopic cholecystectomy on 05/28/2022.  She has had multiple ER visits.  The last one was June 21, 2022 at which point a pulmonary embolism was found.  She was started on Eliquis.  Her liver enzyme tests were within normal limits.  She states that she currently she has a decreased appetite, but that the ongoing nausea is better since since the surgery.  She had this for many months prior to the surgery.  She denies any fever, chills, or jaundice.  She just saw a gastroenterologist who felt that the fluid collection seen on the CAT scan done on August 4 was not contributing to her overall symptoms.  They are planning to do an EGD once she is off Eliquis. Objective:    BP (!) 144/80   Pulse 81   Temp (!) 97.5 F (36.4 C) (Oral)   Resp 12   Ht '5\' 6"'$  (1.676 m)   Wt 231 lb (104.8 kg)   SpO2 95%   BMI 37.28 kg/m   General:  alert, cooperative, and no distress  Abdomen is soft with discomfort along the abdominal wall at the incision sites.  No rigidity is noted.  No hernias are noted.  CT scan images personally reviewed.  A 2.6 cm fluid collection in the subhepatic fossa is seen, but this can be a normal finding after laparoscopic cholecystectomy due to Surgicel use.      Assessment:    Status post laparoscopic cholecystectomy.  I told her that all the findings are consistent with a normal postoperative cholecystectomy abdomen.  I cannot explain her ongoing decreased appetite.  She has had multiple issues prior to her cholecystectomy.  She does state that her nausea is much improved since the surgery.    Plan:   She understands and agrees with my findings.  No need for further work-up from the surgical standpoint.  She will follow-up with gastroenterology.  Follow-up here as needed.

## 2022-07-18 NOTE — Patient Instructions (Signed)
Increase pantoprazole to 40 mg daily 30 minutes before breakfast.  Eat 4-6 small meals daily.  Add 1-2 protein shakes daily to help maintain your weight.  Start Linzess 290 mcg daily on empty stomach, 30 minutes before breakfast.  I am sending a prescription to your pharmacy and we are providing you with a discount card.  If Linzess is still too expensive, please let me know.  Keep your upcoming appointments with general surgery, nephrology, and pulmonology.  We will plan to follow-up with you in 3 months, but please let me know if Dr. Melvyn Novas clears you for an upper endoscopy before your next follow-up.  Aliene Altes, PA-C [facility]

## 2022-07-19 LAB — URINALYSIS, ROUTINE W REFLEX MICROSCOPIC
Bilirubin, UA: NEGATIVE
Glucose, UA: NEGATIVE
Nitrite, UA: NEGATIVE
Specific Gravity, UA: 1.025 (ref 1.005–1.030)
Urobilinogen, Ur: >8 mg/dL — ABNORMAL HIGH (ref 0.2–1.0)
pH, UA: 7 (ref 5.0–7.5)

## 2022-07-19 LAB — MICROSCOPIC EXAMINATION
Epithelial Cells (non renal): NONE SEEN /hpf (ref 0–10)
RBC, Urine: NONE SEEN /hpf (ref 0–2)
Renal Epithel, UA: NONE SEEN /hpf
WBC, UA: >30 /hpf — AB (ref 0–5)

## 2022-07-22 ENCOUNTER — Encounter: Payer: Self-pay | Admitting: Family Medicine

## 2022-07-22 NOTE — Assessment & Plan Note (Signed)
Work excuse and urgent psych re eval

## 2022-07-22 NOTE — Assessment & Plan Note (Signed)
Uncontrolled , severe, debilitating , unable to perform job Psychiatry will be aleted re current status to direct tretment and extent of debility and long er period out of work Score is 24 Work excuse from 09/01 to 07/29/2022, with urgernt referral back to Psych nd messagealso

## 2022-07-23 ENCOUNTER — Encounter: Payer: Self-pay | Admitting: Psychiatry

## 2022-07-23 ENCOUNTER — Telehealth (INDEPENDENT_AMBULATORY_CARE_PROVIDER_SITE_OTHER): Payer: 59 | Admitting: Psychiatry

## 2022-07-23 ENCOUNTER — Telehealth: Payer: Self-pay | Admitting: Psychiatry

## 2022-07-23 DIAGNOSIS — G4700 Insomnia, unspecified: Secondary | ICD-10-CM | POA: Diagnosis not present

## 2022-07-23 DIAGNOSIS — F332 Major depressive disorder, recurrent severe without psychotic features: Secondary | ICD-10-CM | POA: Diagnosis not present
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
## 2022-07-23 NOTE — Telephone Encounter (Signed)
Her PCP wanted her to have sooner appointment. Could you contact her to see if she is interested in having appointment today in available slot for video visit, only if she is interested? Thanks.

## 2022-07-24 ENCOUNTER — Ambulatory Visit: Payer: Managed Care, Other (non HMO) | Admitting: Family Medicine

## 2022-07-24 ENCOUNTER — Telehealth (HOSPITAL_COMMUNITY): Payer: Self-pay | Admitting: Psychiatry

## 2022-07-24 NOTE — Telephone Encounter (Signed)
D:  [name] referred pt back to [facility] [facility].  Apparently, pt had her first [place] session back in March 2023.   According to pt, she stopped because of the tapping sound.  "I am willing to try it again.  I need something."  Pt scored 23 on the PHQ-9.  Denies SI/HI or A/ V hallucinations.  Reports she still has [employer].  A:  Interim coordinator will check with the [place] team to see if Dr. Mamie Nick needs to do another consult or not, since other one was done on 01-11-22.  Coordinator will contact [employer] to get [place] authorized.  R:  Pt receptive.

## 2022-07-25 ENCOUNTER — Telehealth: Payer: Self-pay | Admitting: Family Medicine

## 2022-07-25 ENCOUNTER — Encounter: Payer: Self-pay | Admitting: Family Medicine

## 2022-07-25 ENCOUNTER — Ambulatory Visit (INDEPENDENT_AMBULATORY_CARE_PROVIDER_SITE_OTHER): Payer: Managed Care, Other (non HMO) | Admitting: Family Medicine

## 2022-07-25 VITALS — BP 122/85 | HR 83 | Resp 16 | Ht 66.0 in | Wt 233.0 lb

## 2022-07-25 DIAGNOSIS — F322 Major depressive disorder, single episode, severe without psychotic features: Secondary | ICD-10-CM

## 2022-07-25 DIAGNOSIS — Z23 Encounter for immunization: Secondary | ICD-10-CM

## 2022-07-25 DIAGNOSIS — D582 Other hemoglobinopathies: Secondary | ICD-10-CM | POA: Diagnosis not present

## 2022-07-25 DIAGNOSIS — F411 Generalized anxiety disorder: Secondary | ICD-10-CM | POA: Diagnosis not present

## 2022-07-25 NOTE — Assessment & Plan Note (Addendum)
Needs rept and also ferritin and iron, will need to contact pt

## 2022-07-25 NOTE — Progress Notes (Signed)
   Olivia Woodward     MRN: [id number]      DOB: 04-27-1966   HPI Olivia Woodward is here for follow up  Had appt with Psych yesterday and will be out for Psych  treatment x 6 weeks Urology is deciding if she needs partial nephrectomy Reports did work 8/31 and 09/01, 09/04 is a holiday, so my work note is from 09/5 to 07/30/2022, and Psych will continue to cover Current migraine med is neither reducing or aborting  headaches, hs upcoming neurology appt Note elevated Hb recently and psych mentioned low ferritin in the past neds updated lab ROS Denies recent fever or chills. Denies sinus pressure, nasal congestion, ear pain or sore throat. Denies chest congestion, productive cough or wheezing. Denies chest pains, palpitations and leg swelling Denies abdominal pain, nausea, vomiting,diarrhea or constipation.   Denies dysuria, frequency, hesitancy or incontinence. Denies joint pain, swelling and limitation in mobility. Denies headaches, seizures, numbness, or tingling. Denies skin break down or rash.   PE  BP 122/85   Pulse 83   Resp 16   Ht '5\' 6"'$  (1.676 m)   Wt 233 lb (105.7 kg)   SpO2 95%   BMI 37.61 kg/m   Patient alert and oriented and in no cardiopulmonary distress.  HEENT: No facial asymmetry, EOMI,     Neck supple .  Chest: Clear to auscultation bilaterally.  CVS: S1, S2 no murmurs, no S3.Regular rate.  ABD: Soft non tender.   Ext: No edema  MS: Adequate ROM spine, shoulders, hips and knees.  Skin: Intact, no ulcerations or rash noted.  Psych: Good eye contact, flat l affect. Memory intact anxious tearful and  depressed appearing.  CNS: CN 2-12 intact, power,  normal throughout.no focal deficits noted.   Assessment & Plan  Depression, major, single episode, severe ([place]) Uncontrolled and worsened, psych actively involved in care re evaluated earlier this wek with new treatment plan and extended work note  Elevated hemoglobin ([place]) Needs rept and also ferritin  and iron, will need to contact pt  GAD (generalized anxiety disorder) Uncontrolled , incapable of functioning, psych managing 1 week work excuse rest by [employer]

## 2022-07-25 NOTE — Telephone Encounter (Signed)
Whe n closing visit , I recommend pt get CBc iro and ferritin, recent HB high, and has h/ low iron and ferritin which may cause fatigue, please let her know and order the tests, encourage her to get in next 1 week please Thanks

## 2022-07-25 NOTE — Patient Instructions (Signed)
F/U first week in December, call if you need me sooner  Flu vaccine today  Work excuse /FMLA from 09/05 to 07/30/2022  Thanks for choosing [facility], we consider it a privelige to serve you.

## 2022-07-25 NOTE — Assessment & Plan Note (Signed)
Uncontrolled , incapable of functioning, psych managing 1 week work excuse rest by [employer]

## 2022-07-25 NOTE — Assessment & Plan Note (Signed)
Uncontrolled and worsened, psych actively involved in care re evaluated earlier this wek with new treatment plan and extended work note

## 2022-07-29 ENCOUNTER — Telehealth (HOSPITAL_COMMUNITY): Payer: Self-pay | Admitting: Psychiatry

## 2022-07-29 NOTE — Telephone Encounter (Signed)
D:  Rob from [facility] called and left vm that a peer to peer is needed for [place] and it needed to be done by 08-04-22.  A:  Placed call to [facility] [phone]) and scheduled the peer to peer for Dr. Fatima Sanger.  Dr. Harlan Stains [phone]) will be calling Dr. Mamie Nick on 08-01-22 @ 9 a.m.Marland Kitchen  Afterwards, Lea (case mgr) will be calling the interim coordinator with f/u details.  Lea's phone number is [phone] ext. [phone].

## 2022-07-30 ENCOUNTER — Other Ambulatory Visit: Payer: Self-pay | Admitting: Family Medicine

## 2022-07-30 ENCOUNTER — Encounter: Payer: Self-pay | Admitting: Adult Health

## 2022-07-30 ENCOUNTER — Ambulatory Visit (INDEPENDENT_AMBULATORY_CARE_PROVIDER_SITE_OTHER): Payer: Managed Care, Other (non HMO) | Admitting: Adult Health

## 2022-07-30 VITALS — BP 139/97 | HR 83 | Ht 66.0 in | Wt 231.5 lb

## 2022-07-30 DIAGNOSIS — G3184 Mild cognitive impairment, so stated: Secondary | ICD-10-CM | POA: Diagnosis not present

## 2022-07-30 DIAGNOSIS — D539 Nutritional anemia, unspecified: Secondary | ICD-10-CM

## 2022-07-30 MED ORDER — PROPRANOLOL HCL 20 MG PO TABS: 20.0000 mg | ORAL_TABLET | Freq: Two times a day (BID) | ORAL | 5 refills | Status: DC

## 2022-07-30 NOTE — Telephone Encounter (Signed)
Pt will come today and get labs

## 2022-07-30 NOTE — Patient Instructions (Addendum)
Your Plan:   Start propranolol '20mg'$  twice daily for headaches. Please monitor blood pressure at home, please let me know if the top number gets less than 90. If no benefit after a couple weeks, please let me know  Continue to monitor memory issues, please let me know if your memory should worsen    Follow up in 4 months or call earlier if needed      Thank you for coming to see [place] at [facility]. I hope we have been able to provide you high quality care today.  You may receive a patient satisfaction survey over the next few weeks. We would appreciate your feedback and comments so that we may continue to improve ourselves and the health of our patients.     Propranolol Tablets What is this medication? PROPRANOLOL (proe PRAN oh lole) treats many conditions such as high blood pressure, tremors, and a type of arrhythmia known as AFib (atrial fibrillation). It works by lowering your blood pressure and heart rate, making it easier for your heart to pump blood to the rest of your body. It may be used to prevent migraine headaches. It works by relaxing the blood vessels in the brain that cause migraines. It belongs to a group of medications called beta blockers. This medicine may be used for other purposes; ask your health care provider or pharmacist if you have questions. COMMON BRAND NAME(S): Inderal What should I tell my care team before I take this medication? They need to know if you have any of these conditions: Circulation problems or blood vessel disease Diabetes History of heart attack or heart disease, vasospastic angina Kidney disease Liver disease Lung or breathing disease, like asthma or emphysema Pheochromocytoma Slow heart rate Thyroid disease An unusual or allergic reaction to propranolol, other beta-blockers, medications, foods, dyes, or preservatives Pregnant or trying to get pregnant Breast-feeding How should I use this medication? Take this  medication by mouth. Take it as directed on the prescription label at the same time every day. Keep taking it unless your care team tells you to stop. Talk to your care team about the use of this medication in children. Special care may be needed. Overdosage: If you think you have taken too much of this medicine contact a poison control center or emergency room at once. NOTE: This medicine is only for you. Do not share this medicine with others. What if I miss a dose? If you miss a dose, take it as soon as you can. If it is almost time for your next dose, take only that dose. Do not take double or extra doses. What may interact with this medication? Do not take this medication with any of the following: Feverfew Phenothiazines like chlorpromazine, mesoridazine, prochlorperazine, thioridazine This medication may also interact with the following: Aluminum hydroxide gel Antipyrine Antiviral medications for HIV or AIDS Barbiturates like phenobarbital Certain medications for blood pressure, heart disease, irregular heart beat Cimetidine Ciprofloxacin Diazepam Fluconazole Haloperidol Isoniazid Medications for cholesterol like cholestyramine or colestipol Medications for mental depression Medications for migraine headache like almotriptan, eletriptan, frovatriptan, naratriptan, rizatriptan, sumatriptan, zolmitriptan NSAIDs, medications for pain and inflammation, like ibuprofen or naproxen Phenytoin Rifampin Teniposide Theophylline Thyroid medications Tolbutamide Warfarin Zileuton This list may not describe all possible interactions. Give your health care provider a list of all the medicines, herbs, non-prescription drugs, or dietary supplements you use. Also tell them if you smoke, drink alcohol, or use illegal drugs. Some items may interact with your medicine. What  should I watch for while using this medication? Visit your care team for regular checks on your progress. Check your blood  pressure as directed. Ask your care team what your blood pressure should be. Also, find out when you should contact him or her. Do not treat yourself for coughs, colds, or pain while you are using this medication without asking your care team for advice. Some medications may increase your blood pressure. You may get drowsy or dizzy. Do not drive, use machinery, or do anything that needs mental alertness until you know how this medication affects you. Do not stand up or sit up quickly, especially if you are an older patient. This reduces the risk of dizzy or fainting spells. Alcohol may interfere with the effect of this medication. Avoid alcoholic drinks. This medication may increase blood sugar. Ask your care team if changes in diet or medications are needed if you have diabetes. What side effects may I notice from receiving this medication? Side effects that you should report to your care team as soon as possible: Allergic reactions--skin rash, itching, hives, swelling of the face, lips, tongue, or throat Heart failure--shortness of breath, swelling of the ankles, feet, or hands, sudden weight gain, unusual weakness or fatigue Low blood pressure--dizziness, feeling faint or lightheaded, blurry vision Raynaud's--cool, numb, or painful fingers or toes that may change color from pale, to blue, to red Redness, blistering, peeling, or loosening of the skin, including inside the mouth Slow heartbeat--dizziness, feeling faint or lightheaded, confusion, trouble breathing, unusual weakness or fatigue Worsening mood, feelings of depression Side effects that usually do not require medical attention (report to your care team if they continue or are bothersome): Change in sex drive or performance Diarrhea Dizziness Fatigue Headache This list may not describe all possible side effects. Call your doctor for medical advice about side effects. You may report side effects to FDA at 1-800-FDA-1088. Where should I  keep my medication? Keep out of the reach of children and pets. Store at room temperature between 20 and 25 degrees C (68 and 77 degrees F). Protect from light. Throw away any unused medication after the expiration date. NOTE: This sheet is a summary. It may not cover all possible information. If you have questions about this medicine, talk to your doctor, pharmacist, or health care provider.  2023 Elsevier/Gold Standard (2021-01-19 00:00:00)

## 2022-07-30 NOTE — Progress Notes (Signed)
[facility] [address] [place]. [place] [address] [phone]       OFFICE FOLLOW UP NOTE  [name] Date of Birth:  Apr 09, 1966 Medical Record Number:  [id number]    Primary neurologist: Dr. Brett Fairy Reason for visit: Memory loss and headaches    SUBJECTIVE:   CHIEF COMPLAINT:  Chief Complaint  Patient presents with   Follow-up memory    Pt reports feeling okay. No concerns. Room 2 alone    HPI:   Update 07/30/2022 JM: patient returns for f/u visit after prior visit 4 months with Dr. Brett Fairy regarding memory loss concerns and headaches.  She is unaccompanied.  She was started on lamotrigine at prior visit but denies any benefit, currently taking 100 mg twice daily.  She continues to experience almost daily headaches.  Believes her memory has improved some since prior visit, closely being followed by behavioral health who adjusted some medications which she believes has helped improve memory.  She has not yet completed neurocognitive evaluation, previously placed referral was denied as she is younger than 54 yo.  No new concerns at this time.     History provided from prior OV note with Dr. Brett Fairy 03/25/2022 Olivia Woodward is a 56 y.o. [facility] patient seen here in a consultation on 03/25/2022  this time from PCP dr Moshe Cipro, MD and not her psychiatrist , Dr Modesta Messing.  This is in regards to a new problem, memory loss.  The patient felt the memory loss was related to long term xanax intake. Her previous visit with me was for insomnia and no organic causes were identified, this patient has long history of severe depression, on medication. She reports names escape her, but she knows in which context these people stand to her ( through church, work, neighbors or her daughters friends ) a well as places that she should know and can't recognize.  She was driving  and got lost on the way to her [employer], stopped driving after that.  Trouble to remember  appointments.    No history of brain injury. No family history of DEMENTIA.    MRI brain from 12-16-2021 reviewed, this was a study cum/ sine contrast and shows a normal brain no atrophy.    No  prior memory testing with PCP or psychiatrist.  MOCA was started here, but patient had significant difficulties with the first 2 lines of testing- there was inability to copy a cube, place the hands of the clock correctly and she failed a trail making test. She couldn't nal me a rhino or camel.   Was switched to MMSE: see results below. HS graduate, some college- business admin.     10-23-2021 Dr. Brett Fairy:  Chief concern according to patient :   Internal referral for 10 years plus history of insomnia.  No prior sleep study. Fatigued when she wakes up, tired throughout the day. Woken by headaches, cluster type- has migraines, too-  Wakes up with headaches. No snoring.    I have the pleasure of seeing Olivia Woodward , a right -handed African American female with Insomnia, unclear if any organic sleep disorder is present.  She  has a poorly abstracted medical history of iron deficiency Anemia, GAD-Anxiety,  chronic Depression, Migraine and Cluster , and Hypertension. This patient has thyroid cancer and completed radiation therapy, this was not entered into her Med Hx . Seasonal allergies.status post  gastric bypass- since 2010.    Sleep relevant medical history: The patient was diagnosed  with thyroid cancer and has undergone radiation therapy.  She is due for a revisit with her oncologist in 6 months.  She is truly feels that the thyroid disease itself has also contributed to a level of fatigue that was not seen before.  She has struggled with depression and that certainly also manifests in poor sleep quality and less restorative sleep.  She is not aware if she snores.  Her daughter sleeps close to her and has never mentioned snoring.  She indicates to bathroom breaks at night, not excessive.  Her average  hours of sleep at night are 4 to 5 hours. Her main problem is to initiate sleep and she reports being able to stay up at night until the early morning hours and then will sleep only 4 or 5 hours. Almost reversed circadian rhythm.   Family medical /sleep history: no other family member on CPAP with OSA, insomnia, sleep walkers.           ROS:   14 system review of systems performed and negative with exception of those listed in HPI  PMH:  Past Medical History:  Diagnosis Date   Anemia    Anxiety    Phreesia 03/18/2021   Depression    Phreesia 03/18/2021   Generalized headaches    Hypertension     PSH:  Past Surgical History:  Procedure Laterality Date   ABDOMINAL HYSTERECTOMY     fibroids, partial   BREAST REDUCTION SURGERY  2005   CHOLECYSTECTOMY N/A 05/27/2022   Procedure: LAPAROSCOPIC CHOLECYSTECTOMY;  Surgeon: Aviva Signs, MD;  Location: [facility] ORS;  Service: General;  Laterality: N/A;   COLONOSCOPY WITH PROPOFOL N/A 05/10/2019   Two 5 to 6 mm polyps in the sigmoid colon and in the ascending colon, removed with a   GASTRIC BYPASS  2007   POLYPECTOMY  05/10/2019   Procedure: POLYPECTOMY;  Surgeon: Daneil Dolin, MD;  Location: [facility] ENDO SUITE;  Service: Endoscopy;;  colon    Social History:  Social History   Socioeconomic History   Marital status: Single    Spouse name: Not on file   Number of children: 2   Years of education: 12   Highest education level: Not on file  Occupational History   Not on file  Tobacco Use   Smoking status: Never   Smokeless tobacco: Never  Vaping Use   Vaping Use: Never used  Substance and Sexual Activity   Alcohol use: No   Drug use: No   Sexual activity: Yes    Birth control/protection: Condom  Other Topics Concern   Not on file  Social History Narrative   Right handed   Caffeine use: tea/soda twice weekly   Social Determinants of Health   Financial Resource Strain: Not on file  Food Insecurity: Not on file   Transportation Needs: Not on file  Physical Activity: Not on file  Stress: Not on file  Social Connections: Not on file  Intimate Partner Violence: Not on file    Family History:  Family History  Problem Relation Age of Onset   Hypertension Mother    Diabetes Mother    Drug abuse Mother    Hypertension Father    Hypertension Sister    Hypertension Sister    Colon cancer Neg Hx     Medications:   Current Outpatient Medications on File Prior to Visit  Medication Sig Dispense Refill   [START ON 08/02/2022] ALPRAZolam (XANAX) 0.25 MG tablet Take 1 tablet (0.25 mg total)  by mouth 2 (two) times daily as needed. for anxiety 60 tablet 0   amLODipine (NORVASC) 10 MG tablet Take 1 tablet (10 mg total) by mouth daily. 90 tablet 1   apixaban (ELIQUIS) 5 MG TABS tablet Take 1 tablet (5 mg total) by mouth 2 (two) times daily. 60 tablet 3   cariprazine (VRAYLAR) 3 MG capsule Take 1 capsule (3 mg total) by mouth daily. 30 capsule 1   Cyanocobalamin (VITAMIN B12) 1000 MCG TBCR Take 2 tablets by mouth once daily 180 tablet 2   cyclobenzaprine (FLEXERIL) 10 MG tablet Take one tablet by mouth twice daily for 5 days , then as needed, for neck spasm 30 tablet 0   ergocalciferol (VITAMIN D2) 1.25 MG (50000 UT) capsule Take 1 capsule (50,000 Units total) by mouth once a week. One capsule once weekly 12 capsule 2   escitalopram (LEXAPRO) 20 MG tablet Take 1 tablet (20 mg total) by mouth daily. 30 tablet 2   Eszopiclone 3 MG TABS Take 1 tablet (3 mg total) by mouth at bedtime. 30 tablet 2   hydrocortisone (ANUSOL-HC) 2.5 % rectal cream Place 1 Application rectally 2 (two) times daily. 30 g 1   lamoTRIgine (LAMICTAL) 25 MG tablet Take 4 tablets (100 mg total) by mouth 2 (two) times daily. Week 1 25 mg bid po, Week 2- 25 mg in AM and 50 mg in PM, Week 3- 50 mg bid po, Week 4- 75 mg bid po, Week 5 100 mg bid po 180 tablet 0   linaclotide (LINZESS) 290 MCG CAPS capsule Take 1 capsule (290 mcg total) by mouth  daily before breakfast. 30 capsule 5   ondansetron (ZOFRAN) 4 MG tablet Take one tablet by mouth once daily, as needed, for nausea 30 tablet 2   pantoprazole (PROTONIX) 40 MG tablet Take 1 tablet (40 mg total) by mouth daily before breakfast. 30 tablet 3   rizatriptan (MAXALT-MLT) 10 MG disintegrating tablet Take 1 tablet (10 mg total) by mouth as needed for migraine. May repeat in 2 hours if needed 10 tablet 3   spironolactone (ALDACTONE) 100 MG tablet Take 1 tablet by mouth once daily 30 tablet 0   topiramate (TOPAMAX) 100 MG tablet Take 100 mg by mouth 2 (two) times daily.     No current facility-administered medications on file prior to visit.    Allergies:   Allergies  Allergen Reactions   Ketorolac Tromethamine Hives    Lips swelled      OBJECTIVE:  Physical Exam  Vitals:   07/30/22 1442  BP: (!) 139/97  Pulse: 83  Weight: 231 lb 8 oz (105 kg)  Height: '5\' 6"'$  (1.676 m)   Body mass index is 37.37 kg/m. No results found.   General: well developed, well nourished, very pleasant middle-age African-American female, seated, in no evident distress Head: head normocephalic and atraumatic.   Neck: supple with no carotid or supraclavicular bruits Cardiovascular: regular rate and rhythm, no murmurs Musculoskeletal: no deformity Skin:  no rash/petichiae Vascular:  Normal pulses all extremities   Neurologic Exam Mental Status: Awake and fully alert.  Fluent speech and language.  Oriented to place and time. Recent and remote memory intact. Attention span, concentration and fund of knowledge appropriate. Mood and affect appropriate.  Cranial Nerves: Pupils equal, briskly reactive to light. Extraocular movements full without nystagmus. Visual fields full to confrontation. Hearing intact. Facial sensation intact. Face, tongue, palate moves normally and symmetrically.  Motor: Normal bulk and tone. Normal strength in all  tested extremity muscles Sensory.: intact to touch , pinprick ,  position and vibratory sensation.  Coordination: Rapid alternating movements normal in all extremities. Finger-to-nose and heel-to-shin performed accurately bilaterally. Gait and Station: Arises from chair without difficulty. Stance is normal. Gait demonstrates normal stride length and balance without use of AD. Tandem walk and heel toe without difficulty.  Reflexes: 1+ and symmetric. Toes downgoing.         ASSESSMENT/PLAN: Olivia Woodward is a 56 y.o. year old female    1.  Chronic headaches  -Initiate propranolol 20 mg twice daily for headache prophylaxis, advised to closely monitor blood pressure and heart rate at home and to call with any issues.  Advised to call after 2 to 3 weeks if no benefit.   -next step: can try monthly injectable CRGP -Continue lamotrigine 100 mg twice daily for now but once under better control, can consider discontinuing -Continue to follow with behavioral health for anxiety/depression management  2.  Memory loss  -Suspect underlying anxiety/depression and medication use large contributing factor with some improvement since medication changes.  Continue to follow with psychiatry  -okay to hold off on neurocognitive evaluation as memory has been improving but can consider if decline should occur       Follow up in 4 months or call earlier if needed    CC:  PCP: Fayrene Helper, MD    I spent 26 minutes of face-to-face and non-face-to-face time with patient.  This included previsit chart review, lab review, study review, order entry, electronic health record documentation, patient education regarding above diagnoses and treatment plan and answered all her questions to patient satisfaction   Frann Rider, [facility]  [facility] [address] [place], [place] [address]  Phone [phone] Fax [phone] Note: This document was prepared with digital dictation and possible smart phrase technology. Any  transcriptional errors that result from this process are unintentional.

## 2022-07-31 ENCOUNTER — Telehealth (HOSPITAL_COMMUNITY): Payer: Self-pay | Admitting: Psychiatry

## 2022-08-01 ENCOUNTER — Telehealth (HOSPITAL_COMMUNITY): Payer: Self-pay | Admitting: Psychiatry

## 2022-08-01 ENCOUNTER — Telehealth: Payer: Self-pay | Admitting: Internal Medicine

## 2022-08-01 ENCOUNTER — Telehealth: Payer: Self-pay | Admitting: Family Medicine

## 2022-08-01 ENCOUNTER — Ambulatory Visit (INDEPENDENT_AMBULATORY_CARE_PROVIDER_SITE_OTHER): Payer: Managed Care, Other (non HMO) | Admitting: Internal Medicine

## 2022-08-01 ENCOUNTER — Other Ambulatory Visit: Payer: Self-pay | Admitting: Family Medicine

## 2022-08-01 ENCOUNTER — Encounter: Payer: Self-pay | Admitting: Internal Medicine

## 2022-08-01 VITALS — BP 120/80 | HR 87 | Temp 97.0°F | Ht 66.0 in | Wt 231.0 lb

## 2022-08-01 DIAGNOSIS — D751 Secondary polycythemia: Secondary | ICD-10-CM | POA: Insufficient documentation

## 2022-08-01 DIAGNOSIS — R0609 Other forms of dyspnea: Secondary | ICD-10-CM

## 2022-08-01 DIAGNOSIS — Z86711 Personal history of pulmonary embolism: Secondary | ICD-10-CM | POA: Diagnosis not present

## 2022-08-01 DIAGNOSIS — F322 Major depressive disorder, single episode, severe without psychotic features: Secondary | ICD-10-CM

## 2022-08-01 NOTE — Telephone Encounter (Signed)
Patient called in requesting a call back. Would not specify reasoning for call.

## 2022-08-01 NOTE — Telephone Encounter (Signed)
She is agreeable to the night time oxygen testing. Dr. Melvyn Novas has placed the order.Nothing further needed.

## 2022-08-01 NOTE — Telephone Encounter (Signed)
She did not know the therapist name and I could not find the info so I had to enter another referral

## 2022-08-01 NOTE — Telephone Encounter (Signed)
She was wanting a referral back to the therapist bc she had an appt and she was sick that day and she is wanting to get another appt but she doesn't know who it was with. Pls advise

## 2022-08-01 NOTE — Progress Notes (Signed)
Olivia Woodward, female    DOB: 09/02/1966    MRN: [id number]   Brief patient profile:  12 yobf never smoker healthy x for wt issues with peak wt 362 then gastric bypass in [place] and then September 2021 with rhinitis/ HA / loss of smell/ cough/ sob with neg covid and never back to baseline so referred to pulmonary clinic in [place]  10/04/2020 by Dr Gerline Legacy    History of Present Illness  10/04/2020  Pulmonary/ 1st office eval/Olivia Woodward  Chief Complaint  Patient presents with   Pulmonary Consult    Referred by Dr. Tula Nakayama. Pt c/o SOB and dizziness since 08/04/20.  She states she can have these symptoms with or without exertion. She also c/o cough with yellow sputum for the past 3 wks. She uses her albuterol inhaler about 3 x per wk.   Dyspnea: loses breath talking / MMRC3 = can't walk 100 yards even at a slow pace at a flat grade s stopping due to sob  Cough: after supper / keeps her up up most nights / mucus discolored / ha worse noct and in am - eval 09/21/20 by ent but focus was on dizziness Sleep:cough wakes her up / 45 degrees electric bed  SABA use: 3 x daily not helping   Rec Augmentin 875 mg take one pill twice daily  X 10 days    S/p Lap chole  05/27/22   Onset R sided pain w/in a couple of weeks of surgery > gone   Sob started with the pain which resolved on R, never had it on L   CT 06/21/22 single subsegmental LLL small PErx eliquis / no echo or venous dopplers done    08/01/2022    re -establish  ov/ office/Olivia Woodward re: PE maint on eliquis   Chief Complaint  Patient presents with   Follow-up    Continued doe    Dyspnea:  can do grocery shopping all the way back of [employer] pushing cart but only did once in the last 4 weeks / extremely sedentary and wants to be cleared for kidney surgery  Cough: none  Sleeping: bed is flat/ on side / one pillow  SABA use: does not have one  02: none  Covid status: vax  and infected     No obvious day to day or  daytime variability or assoc excess/ purulent sputum or mucus plugs or hemoptysis or cp or chest tightness, subjective wheeze or overt sinus or hb symptoms.   Sleeping  without nocturnal  or early am exacerbation  of respiratory  c/o's or need for noct saba. Also denies any obvious fluctuation of symptoms with weather or environmental changes or other aggravating or alleviating factors except as outlined above   No unusual exposure hx or h/o childhood pna/ asthma or knowledge of premature birth.  Current Allergies, Complete Past Medical History, Past Surgical History, Family History, and Social History were reviewed in [employer] record.  ROS  The following are not active complaints unless bolded Hoarseness, sore throat, dysphagia, dental problems, itching, sneezing,  nasal congestion or discharge of excess mucus or purulent secretions, ear ache,   fever, chills, sweats, unintended wt loss or wt gain, classically pleuritic or exertional cp,  orthopnea pnd or arm/hand swelling  or leg swelling, presyncope, palpitations, abdominal pain, anorexia, nausea, vomiting, diarrhea  or change in bowel habits or change in bladder habits, change in stools or change in urine, dysuria, hematuria,  rash, arthralgias, visual complaints, headache, numbness, weakness or ataxia or problems with walking or coordination,  change in mood/ anxious or  memory.        Current Meds  Medication Sig   [START ON 08/02/2022] ALPRAZolam (XANAX) 0.25 MG tablet Take 1 tablet (0.25 mg total) by mouth 2 (two) times daily as needed. for anxiety   amLODipine (NORVASC) 10 MG tablet Take 1 tablet (10 mg total) by mouth daily.   apixaban (ELIQUIS) 5 MG TABS tablet Take 1 tablet (5 mg total) by mouth 2 (two) times daily.   cariprazine (VRAYLAR) 3 MG capsule Take 1 capsule (3 mg total) by mouth daily.   Cyanocobalamin (VITAMIN B12) 1000 MCG TBCR Take 2 tablets by mouth once daily   cyclobenzaprine (FLEXERIL) 10 MG  tablet Take one tablet by mouth twice daily for 5 days , then as needed, for neck spasm   ergocalciferol (VITAMIN D2) 1.25 MG (50000 UT) capsule Take 1 capsule (50,000 Units total) by mouth once a week. One capsule once weekly   escitalopram (LEXAPRO) 20 MG tablet Take 1 tablet (20 mg total) by mouth daily.   Eszopiclone 3 MG TABS Take 1 tablet (3 mg total) by mouth at bedtime.   hydrocortisone (ANUSOL-HC) 2.5 % rectal cream Place 1 Application rectally 2 (two) times daily.   lamoTRIgine (LAMICTAL) 25 MG tablet Take 4 tablets (100 mg total) by mouth 2 (two) times daily. Week 1 25 mg bid po, Week 2- 25 mg in AM and 50 mg in PM, Week 3- 50 mg bid po, Week 4- 75 mg bid po, Week 5 100 mg bid po   linaclotide (LINZESS) 290 MCG CAPS capsule Take 1 capsule (290 mcg total) by mouth daily before breakfast.   ondansetron (ZOFRAN) 4 MG tablet Take one tablet by mouth once daily, as needed, for nausea   pantoprazole (PROTONIX) 40 MG tablet Take 1 tablet (40 mg total) by mouth daily before breakfast.   propranolol (INDERAL) 20 MG tablet Take 1 tablet (20 mg total) by mouth 2 (two) times daily.   rizatriptan (MAXALT-MLT) 10 MG disintegrating tablet Take 1 tablet (10 mg total) by mouth as needed for migraine. May repeat in 2 hours if needed   spironolactone (ALDACTONE) 100 MG tablet Take 1 tablet by mouth once daily   topiramate (TOPAMAX) 100 MG tablet Take 100 mg by mouth 2 (two) times daily.           Objective:     Wt Readings from Last 3 Encounters:  08/01/22 231 lb (104.8 kg)  07/30/22 231 lb 8 oz (105 kg)  07/25/22 233 lb (105.7 kg)    Vital signs reviewed  08/01/2022  - Note at rest 02 sats  ? 91% on RA   General appearance:    amb somber obese bf nad      HEENT : Oropharynx  clear     Nasal turbinates nl    NECK :  without  apparent JVD/ palpable Nodes/TM    LUNGS: no acc muscle use,  Nl contour chest which is clear to A and P bilaterally without cough on insp or exp maneuvers   CV:   RRR  no s3 or murmur or increase in P2, and no edema   ABD:  obese soft and nontender with limited inspiratory excursion in the supine position. No bruits or organomegaly appreciated   MS:  Nl gait/ ext warm without deformities Or obvious joint restrictions  calf tenderness, cyanosis or clubbing  SKIN: warm and dry without lesions    NEURO:  alert, approp, nl sensorium with  no motor or cerebellar deficits apparent.     I personally reviewed images and agree with radiology impression as follows:   Chest CTa  06/21/22 1. Small subsegmental pulmonary embolus in the left lower lobe.   2. Patchy atelectasis or infiltrate at the lung bases. 3. Status post cholecystectomy with a small amount of residual fluid in the gallbladder fossa measuring 2.6 x 1.1 cm.   4. 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 There is nephrolithiasis in the lower pole the left kidney with a 2.8 cm calculus in the lower pole renal pelvis resulting in severe obstructive uropathy with associated cortical thinning which is likely chronic and unchanged from the prior exam. 5. Gastric surgery changes with small hiatal hernia. 6. Stable indeterminate right adrenal nodule. Multiphase CT with adrenal protocol is suggested for further characterization on follow-up.     Lab Results  Component Value Date   TSH 2.930 04/03/2022       Lab Results  Component Value Date   HGB 18.4 (H) 06/21/2022   HGB 13.2 05/30/2022   HGB 12.8 04/16/2022   HGB 13.8 11/22/2021   HGB 12.7 03/22/2021   HGB 14.1 09/06/2020     Lab Results  Component Value Date   CREATININE 1.18 (H) 06/21/2022   CREATININE 1.08 (H) 05/30/2022   CREATININE 1.12 (H) 04/16/2022       Assessment

## 2022-08-01 NOTE — Telephone Encounter (Signed)
Let her know I reviewed her labs from prior visits and see her blood is too thick and will need to f/u with Dr Moshe Cipro as planned but in meantime needs ONO RA which I ordered to be sure the problem is not related to occult noct hypoxemia

## 2022-08-01 NOTE — Patient Instructions (Addendum)
My office will be contacting you by phone for referral to do Echocardiogram and Venous doppler  in [place]  - if you don't hear back from my office within one week please call us back or notify us thru MyChart and we'll address it right away.    To get the most out of exercise, you need to be continuously aware that you are short of breath, but never out of breath, for at least 30 minutes daily. As you improve, it will actually be easier for you to do the same amount of exercise  in  30 minutes so always push to the level where you are short of breath.   Make sure you check your oxygen saturations at highest level of activity and let us know if falling below 89% consistently   Please schedule a follow up office visit in 4 weeks, sooner if needed to see if we can clear you for kidney surgery

## 2022-08-01 NOTE — Assessment & Plan Note (Addendum)
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   Most impressed today by her affect (hopeless/helpless) and lack of effort on the walking study we did.  I explained that small peripheral PEs are likely very commonly missed and actually protective to the systemic circulation  in pts who are obese/ post op and extremely inactive at baseline and not an ongoing issue for her - except that it puts her at higher risk in the future if placed in the same setting.  Ironically, she is now back requesting clearance for possible Kidney surgery so my recs are:  1) she must start doing more consistent sub max ex > 30  Min daily  2) continue full dose eliquis until 2 days prior to surgery (skip 4 doses)  then resume as soon as hemostasis is achieved. 3) baseline echo/ venous dopplers need to done asap just to be sure any lingering RV dysfunction or DVT might be present (which would increase her risk enough to consider temporary IVC filter)  4) Place on prophylactic doses of eliquis  (2.5 mg bid) 6 weeks p surgery or whenever she's full back to baseline mobility status  5) long term wt loss per PCP's guidance 6) address Polycythemia - see sep a/p

## 2022-08-01 NOTE — Assessment & Plan Note (Addendum)
Dx 06/21/22 / increases risk of DVT/PE   This problem will need to be addressed preop    rec Consider stopping all diuretics unless clearly edematous ONO on RA and sleep medicine eval prn Repeat cbc with Fe studies as well  If no desats noct may need heme eval   Each maintenance medication was reviewed in detail including emphasizing most importantly the difference between maintenance and prns and under what circumstances the prns are to be triggered using an action plan format where appropriate.  Total time for H and P, chart review, counseling,  directly observing portions of ambulatory 02 saturation study/ and generating customized AVS unique to this office visit / same day charting  > 40 min in pt not seen in over 2 years

## 2022-08-01 NOTE — Telephone Encounter (Addendum)
D:  Dr. Mamie Nick did the peer to peer with the patient's insurance company this morning.  According to Dr. Mamie Nick, the insurance company did authorize the [place] treatments.  A:  Placed call to inform patient.  Stressed to patient (per Dr. Lucy Antigua recommendation) the need to follow through with all the treatments b/c she only did one treatment last time.  Informed pt that it would probably be very difficult to obtain authorization for a third time if she doesn't follow through this time.  Informed pt, if it happens this time; other options would have to be explored (ie. ECT with Dr. Weber Cooks). Pt states she understands and she is going to do better this time.  Pt is scheduled to come in for the first mapping on 08-05-22 @ 7 a.m (encouraged pt to arrive at 6:50 a.m).  Reiterated the directions and where to go once she arrives at [facility] that morning.  Inform the [facility] team and [name].  R:  Pt receptive.

## 2022-08-02 ENCOUNTER — Telehealth (HOSPITAL_COMMUNITY): Payer: Self-pay | Admitting: Psychiatry

## 2022-08-02 ENCOUNTER — Other Ambulatory Visit: Payer: Self-pay | Admitting: Internal Medicine

## 2022-08-02 DIAGNOSIS — R0609 Other forms of dyspnea: Secondary | ICD-10-CM

## 2022-08-02 LAB — IRON: Iron: 117 ug/dL (ref 27–159)

## 2022-08-02 LAB — CBC
Hematocrit: 42.2 % (ref 34.0–46.6)
Hemoglobin: 13.6 g/dL (ref 11.1–15.9)
MCH: 31.1 pg (ref 26.6–33.0)
MCHC: 32.2 g/dL (ref 31.5–35.7)
MCV: 97 fL (ref 79–97)
Platelets: 325 10*3/uL (ref 150–450)
RBC: 4.37 x10E6/uL (ref 3.77–5.28)
RDW: 11.8 % (ref 11.7–15.4)
WBC: 6.1 10*3/uL (ref 3.4–10.8)

## 2022-08-02 LAB — FERRITIN: Ferritin: 43 ng/mL (ref 15–150)

## 2022-08-02 NOTE — Addendum Note (Signed)
Addended by: Dessie Coma on: 08/02/2022 10:29 AM   Modules accepted: Orders

## 2022-08-02 NOTE — Progress Notes (Signed)
Per Judeen Hammans she needs a new ONO order for this patient and I place the new order.

## 2022-08-05 ENCOUNTER — Encounter (HOSPITAL_COMMUNITY): Payer: Managed Care, Other (non HMO)

## 2022-08-05 ENCOUNTER — Ambulatory Visit (HOSPITAL_COMMUNITY)
Admission: RE | Admit: 2022-08-05 | Discharge: 2022-08-05 | Disposition: A | Discharge status: Home / Self Care | Payer: Managed Care, Other (non HMO) | Source: Ambulatory Visit | Attending: Internal Medicine | Admitting: Internal Medicine

## 2022-08-05 ENCOUNTER — Other Ambulatory Visit (HOSPITAL_COMMUNITY): Payer: 59 | Attending: Psychiatry | Admitting: *Deleted

## 2022-08-05 DIAGNOSIS — Z86711 Personal history of pulmonary embolism: Secondary | ICD-10-CM | POA: Insufficient documentation

## 2022-08-05 DIAGNOSIS — F332 Major depressive disorder, recurrent severe without psychotic features: Secondary | ICD-10-CM

## 2022-08-05 DIAGNOSIS — R0609 Other forms of dyspnea: Secondary | ICD-10-CM | POA: Insufficient documentation

## 2022-08-05 NOTE — Progress Notes (Unsigned)
Pt reported to [facility] for her cortical mapping and motor threshold determination for Repetitive Transcranial Magnetic Stimulation treatment for Major Depressive Disorder. Pt completed a PHQ-9 with a score of 26.  Prior to procedure, pt signed an informed consent agreement for Walker treatment. Pt's treatment area was found by applying single pulses to her left motor cortex, hunting along the anterior/posterior plane and along the superior oblique angle until the best motor response was elicited from the pt's right thumb. Using the [employer]'s proprietary MT Assist algorithm, which produced a calculated motor threshold of 0.99 SMT. Per these findings, pt's treatment parameter. With these parameters, the pt will receive 36 sessions of TMS according to the following protocol: 3000 pulses per session. After determining pt's tx parameters, coil was moved to the treatment location, and the first burst of pulses was applied at a reduced power of 60% MT. Pt was able to tolerate titration to 75% MT by the end of treatment. Pt reported no complaints, and stated that the stimulation was tolerable. Upon completion of mapping, pt completed a few treatment intervals for observation of side effects. Pt tolerated tx well. Pt departed from clinic without issue. Dr. Kai Levins completed cortical mapping.

## 2022-08-05 NOTE — Progress Notes (Signed)
Bilateral lower extremity venous duplex completed. Refer to "CV Proc" under chart review to view preliminary results.  08/05/2022 9:28 AM Kelby Aline., MHA, RVT, RDCS, RDMS

## 2022-08-06 ENCOUNTER — Other Ambulatory Visit: Payer: Self-pay | Admitting: Family Medicine

## 2022-08-06 ENCOUNTER — Other Ambulatory Visit (HOSPITAL_COMMUNITY): Payer: 59 | Attending: Psychiatry | Admitting: *Deleted

## 2022-08-06 DIAGNOSIS — F332 Major depressive disorder, recurrent severe without psychotic features: Secondary | ICD-10-CM

## 2022-08-06 NOTE — Progress Notes (Signed)
  Patient reported to [facility] for her 2nd session of Repetitive Transcranial Magnetic Stimulation treatment for severe episode of recurrent major depressive disorder, without psychotic features. Patient presented with appropriate affect, level mood and denied any suicidal or homicidal ideations.Patient reported no change in alcohol/substance use, caffeine consumption, sleep pattern or metal implant status since previous tx. Pt reported having a migraine this morning. Power was titrated to 80%. Patient reported no complaints.

## 2022-08-07 ENCOUNTER — Ambulatory Visit (HOSPITAL_COMMUNITY)
Admission: RE | Admit: 2022-08-07 | Discharge: 2022-08-07 | Disposition: A | Discharge status: Home / Self Care | Payer: Managed Care, Other (non HMO) | Source: Ambulatory Visit | Attending: Internal Medicine | Admitting: Internal Medicine

## 2022-08-07 ENCOUNTER — Other Ambulatory Visit (HOSPITAL_COMMUNITY): Payer: 59 | Attending: Psychiatry | Admitting: *Deleted

## 2022-08-07 DIAGNOSIS — R0609 Other forms of dyspnea: Secondary | ICD-10-CM | POA: Insufficient documentation

## 2022-08-07 DIAGNOSIS — F332 Major depressive disorder, recurrent severe without psychotic features: Secondary | ICD-10-CM

## 2022-08-07 DIAGNOSIS — Z86711 Personal history of pulmonary embolism: Secondary | ICD-10-CM | POA: Insufficient documentation

## 2022-08-07 LAB — ECHOCARDIOGRAM COMPLETE
AR max vel: 2.35 cm2
AV Area VTI: 2.27 cm2
AV Area mean vel: 2.21 cm2
AV Mean grad: 3 mmHg
AV Peak grad: 5.1 mmHg
Ao pk vel: 1.13 m/s
Area-P 1/2: 2.96 cm2
MV VTI: 1.97 cm2
S' Lateral: 3 cm

## 2022-08-07 NOTE — Progress Notes (Signed)
*  PRELIMINARY RESULTS* Echocardiogram 2D Echocardiogram has been performed.  Olivia Woodward 08/07/2022, 11:40 AM

## 2022-08-07 NOTE — Progress Notes (Signed)
Patient reported to [facility] for her 3rd session of Repetitive Transcranial Magnetic Stimulation treatment for severe episode of recurrent major depressive disorder, without psychotic features. Patient presented with flat affect, level mood and denied any suicidal or homicidal ideations.Patient reported no change in alcohol/substance use, caffeine consumption, sleep pattern or metal implant status since previous tx. Pt reported still having a headache this morning. Power was titrated to 85%for the last 9 minutes. Patient reported no complaints.

## 2022-08-08 ENCOUNTER — Other Ambulatory Visit (HOSPITAL_COMMUNITY): Payer: 59 | Attending: Psychiatry | Admitting: *Deleted

## 2022-08-08 DIAGNOSIS — F332 Major depressive disorder, recurrent severe without psychotic features: Secondary | ICD-10-CM

## 2022-08-08 NOTE — Progress Notes (Signed)
Patient reported to [facility] for her 3rd session of Repetitive Transcranial Magnetic Stimulation treatment for severe episode of recurrent major depressive disorder, without psychotic features. Patient presented with flat affect, level mood and denied any suicidal or homicidal ideations.She reports her children "keep her here." Patient reported ongoing difficulty sleeping. no change in alcohol/substance use, caffeine consumption, sleep pattern or metal implant status since previous tx. Pt reported not having a headache this morning. Power was titrated to 95% for the last 10 minutes. Patient reported no complaints.

## 2022-08-09 ENCOUNTER — Other Ambulatory Visit (HOSPITAL_COMMUNITY): Payer: 59 | Attending: Psychiatry | Admitting: *Deleted

## 2022-08-09 DIAGNOSIS — F332 Major depressive disorder, recurrent severe without psychotic features: Secondary | ICD-10-CM | POA: Diagnosis not present

## 2022-08-09 NOTE — Progress Notes (Signed)
Patient reported to [facility] for her 4th session of Repetitive Transcranial Magnetic Stimulation treatment for severe episode of recurrent major depressive disorder, without psychotic features. Patient presented with brighter affect, level mood and denied any suicidal or homicidal ideations.She reports her children "keep her here." Pt had a flat tire this morning, but was able to reschedule and come for treatment. No change in alcohol/substance use, caffeine consumption, sleep pattern or metal implant status since previous tx. Pt reported not having a headache this morning. Power was titrated to 110%. Patient reported no complaints.PHQ-9 today a 27.

## 2022-08-12 ENCOUNTER — Ambulatory Visit: Payer: Managed Care, Other (non HMO) | Admitting: Internal Medicine

## 2022-08-12 ENCOUNTER — Other Ambulatory Visit (HOSPITAL_COMMUNITY): Payer: 59 | Attending: Psychiatry | Admitting: *Deleted

## 2022-08-12 DIAGNOSIS — F332 Major depressive disorder, recurrent severe without psychotic features: Secondary | ICD-10-CM

## 2022-08-12 NOTE — Patient Instructions (Signed)
Patient reported to [facility] for her 6th session of Repetitive Transcranial Magnetic Stimulation treatment for severe episode of recurrent major depressive disorder, without psychotic features. Patient presented with brighter affect, level mood and denied any suicidal or homicidal ideations.She reports her children "keep her here." Pt had a flat tire this morning, but was able to reschedule and come for treatment. No change in alcohol/substance use, caffeine consumption, sleep pattern or metal implant status since previous tx. Pt reported not having a headache this morning. Power was titrated to 95%. Patient reported she got a little lost this AM coming to treatment, but thinks it was due to some new medications her MD ordered.

## 2022-08-13 ENCOUNTER — Other Ambulatory Visit (HOSPITAL_COMMUNITY): Payer: 59 | Attending: Psychiatry | Admitting: *Deleted

## 2022-08-13 DIAGNOSIS — F332 Major depressive disorder, recurrent severe without psychotic features: Secondary | ICD-10-CM

## 2022-08-13 NOTE — Progress Notes (Signed)
  Patient reported to [facility] for her 7th session of Repetitive Transcranial Magnetic Stimulation treatment for severe episode of recurrent major depressive disorder, without psychotic features. Patient presented with brighter affect, level mood and denied any suicidal or homicidal ideations.She reports her children "keep her here."  No change in alcohol/substance use, caffeine consumption, sleep pattern or metal implant status since previous tx. Pt reported not having a headache this morning. Power was titrated to 105%. Pt left treatment with no complaints or concerns.

## 2022-08-14 ENCOUNTER — Telehealth (HOSPITAL_COMMUNITY): Payer: Self-pay | Admitting: Psychiatry

## 2022-08-14 ENCOUNTER — Other Ambulatory Visit (HOSPITAL_COMMUNITY): Payer: 59 | Attending: Psychiatry

## 2022-08-14 ENCOUNTER — Telehealth: Payer: Self-pay

## 2022-08-14 NOTE — Telephone Encounter (Signed)
Patient states that she is having excruciating pain in her back and it wraps around her right side into her abdomen.  She also complains of intermittent nausea.  Her symptoms began yesterday, she states she had a temperature of 99.7 then and today it is 97.1. I have scheduled her with Dr. Jeffie Pollock tomorrow at 230pm, she was unable to come in earlier due to another appointment.  Sharee Pimple was consulted with the patient's symptoms and she advised that the patient be seen by MD tomorrow and if she is unable to wait and her symptoms worsen she needs to go to the nearest ER to be evaluated.  Patient voiced understanding.

## 2022-08-15 ENCOUNTER — Other Ambulatory Visit (HOSPITAL_COMMUNITY): Payer: 59 | Attending: Psychiatry | Admitting: *Deleted

## 2022-08-15 ENCOUNTER — Telehealth: Payer: Self-pay

## 2022-08-15 ENCOUNTER — Ambulatory Visit: Payer: Managed Care, Other (non HMO) | Admitting: Urology

## 2022-08-15 DIAGNOSIS — F332 Major depressive disorder, recurrent severe without psychotic features: Secondary | ICD-10-CM | POA: Diagnosis not present

## 2022-08-15 NOTE — Telephone Encounter (Signed)
Patient made aware of MD recommendations and voiced understanding.  She agreed to be seen at [facility] ED for further evaluation.  Today's apt canceled per MD.

## 2022-08-15 NOTE — Telephone Encounter (Signed)
-----   Message from Reynaldo Minium, [place] sent at 08/15/2022 11:02 AM EDT ----- Dr. Jeffie Pollock made aware of the pt's c/o right flank pain and he is aware of her left renal obstruction/stone. Pt is on his schedule today, but he wants her to go to the ED at [facility] for eval due to her hx and need for care that [facility] cannot offer.

## 2022-08-15 NOTE — Telephone Encounter (Signed)
Patient is aware and has agreed to be evaluated at [facility] ED.

## 2022-08-15 NOTE — Progress Notes (Signed)
Patient reported to [facility] for her 8th session of Repetitive Transcranial Magnetic Stimulation treatment for severe episode of recurrent major depressive disorder, without psychotic features. Patient presented with flat affect, depressed mood and denied any suicidal or homicidal ideations.She reports missing treatment yesterday due to her depression. Encouraged her to try to make all remaining appts. No change in alcohol/substance use, caffeine consumption, sleep pattern or metal implant status since previous tx. Pt reported not having a headache this morning. Power was titrated to 120% for the last 5 minutes.

## 2022-08-17 ENCOUNTER — Encounter (HOSPITAL_COMMUNITY): Payer: Self-pay | Admitting: Hematology

## 2022-08-19 ENCOUNTER — Encounter (HOSPITAL_COMMUNITY): Payer: Self-pay | Admitting: Hematology

## 2022-08-19 ENCOUNTER — Other Ambulatory Visit (HOSPITAL_COMMUNITY): Payer: 59 | Attending: Psychiatry | Admitting: *Deleted

## 2022-08-19 DIAGNOSIS — F332 Major depressive disorder, recurrent severe without psychotic features: Secondary | ICD-10-CM | POA: Diagnosis not present

## 2022-08-19 NOTE — Progress Notes (Signed)
Patient reported to [facility] for her 9th session of Repetitive Transcranial Magnetic Stimulation treatment for severe episode of recurrent major depressive disorder, without psychotic features. Patient presented with flat affect, depressed mood and denied any suicidal or homicidal ideations.Pt reports a bad headache this morning. She asks whether these treatments will actually work, as she doesn't feel any benefit yet. Encouraged her to try to make all remaining appts. No change in alcohol/substance use, caffeine consumption, sleep pattern or metal implant status since previous tx. Pt reported not having a headache this morning. Power was titrated to 100% due to complaints of heacache.

## 2022-08-20 ENCOUNTER — Telehealth (HOSPITAL_COMMUNITY): Payer: Self-pay | Admitting: Psychiatry

## 2022-08-20 ENCOUNTER — Other Ambulatory Visit (HOSPITAL_COMMUNITY): Payer: 59 | Attending: Psychiatry
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
## 2022-08-21 ENCOUNTER — Telehealth (HOSPITAL_COMMUNITY): Payer: Self-pay | Admitting: Psychiatry

## 2022-08-21 ENCOUNTER — Telehealth (INDEPENDENT_AMBULATORY_CARE_PROVIDER_SITE_OTHER): Payer: 59 | Admitting: Psychiatry

## 2022-08-21 ENCOUNTER — Encounter: Payer: Self-pay | Admitting: Psychiatry

## 2022-08-21 ENCOUNTER — Other Ambulatory Visit (HOSPITAL_COMMUNITY): Payer: 59 | Attending: Psychiatry | Admitting: *Deleted

## 2022-08-21 DIAGNOSIS — F331 Major depressive disorder, recurrent, moderate: Secondary | ICD-10-CM

## 2022-08-21 DIAGNOSIS — F332 Major depressive disorder, recurrent severe without psychotic features: Secondary | ICD-10-CM | POA: Diagnosis not present

## 2022-08-21 DIAGNOSIS — F419 Anxiety disorder, unspecified: Secondary | ICD-10-CM | POA: Diagnosis not present

## 2022-08-21 DIAGNOSIS — G4700 Insomnia, unspecified: Secondary | ICD-10-CM | POA: Diagnosis not present

## 2022-08-21 MED ORDER — CARIPRAZINE HCL 3 MG PO CAPS: 3.0000 mg | ORAL_CAPSULE | Freq: Every day | ORAL | 1 refills | Status: DC

## 2022-08-21 MED ORDER — ESCITALOPRAM OXALATE 20 MG PO TABS: 20.0000 mg | ORAL_TABLET | Freq: Every day | ORAL | 3 refills | Status: DC

## 2022-08-21 MED ORDER — ESZOPICLONE 3 MG PO TABS: 3.0000 mg | ORAL_TABLET | Freq: Every day | ORAL | 0 refills | Status: DC

## 2022-08-21 MED ORDER — ALPRAZOLAM 0.25 MG PO TABS: 0.2500 mg | ORAL_TABLET | Freq: Two times a day (BID) | ORAL | 0 refills | Status: DC | PRN

## 2022-08-21 NOTE — Patient Instructions (Signed)
Continue lexapro 20 mg daily Continue Vraylar 3 mg daily  Continue Lunesta 3 mg nightly as needed for insomnia Continue Xanax 0.25 mg twice a day as needed for anxiety  Referral to evaluation of sleep apnea Next appointment: 11/22 at 2:30

## 2022-08-21 NOTE — Progress Notes (Signed)
Patient reported to [facility] for her 10th session of Repetitive Transcranial Magnetic Stimulation treatment for severe episode of recurrent major depressive disorder, without psychotic features. Patient presented with flat affect, depressed mood and denied any suicidal or homicidal ideations.Pt reports feeling very depressed, which is why she didn't make it to treatment yesterday. Encouraged her to try to make all remaining appts. No change in alcohol/substance use, caffeine consumption, sleep pattern or metal implant status since previous tx. Pt reported not having a headache this morning. Power was titrated to 120% due to complaints of heacache.

## 2022-08-22 ENCOUNTER — Other Ambulatory Visit (HOSPITAL_COMMUNITY): Payer: 59 | Attending: Psychiatry | Admitting: *Deleted

## 2022-08-22 ENCOUNTER — Encounter (HOSPITAL_COMMUNITY): Payer: Self-pay | Admitting: Hematology

## 2022-08-22 DIAGNOSIS — F332 Major depressive disorder, recurrent severe without psychotic features: Secondary | ICD-10-CM

## 2022-08-22 NOTE — Progress Notes (Signed)
Patient reported to [facility] for her 11th session of Repetitive Transcranial Magnetic Stimulation treatment for severe episode of recurrent major depressive disorder, without psychotic features. Patient presented with flat affect, depressed mood and denied any suicidal or homicidal ideations.Pt reports feeling very depressed. Encouraged her to try to make all remaining appts. No change in alcohol/substance use, caffeine consumption, sleep pattern or metal implant status since previous tx. Pt reported not having a headache this morning. Power was titrated to 120% due to complaints of heacache.

## 2022-08-23 ENCOUNTER — Encounter (HOSPITAL_COMMUNITY): Payer: Self-pay | Admitting: Hematology

## 2022-08-23 ENCOUNTER — Other Ambulatory Visit (HOSPITAL_COMMUNITY): Payer: 59 | Attending: Psychiatry | Admitting: *Deleted

## 2022-08-23 ENCOUNTER — Telehealth (HOSPITAL_COMMUNITY): Payer: Self-pay | Admitting: Psychiatry

## 2022-08-23 NOTE — Telephone Encounter (Signed)
D:  Pt didn't arrive for her [place] treatment today.  Pt called the front desk and stated she overslept.  A:  Placed call to check on pt.  Pt states she didn't want to put Olivia Crape, RN in a bind this morning, so she just decided not to reschedule for today.  Pt confirmed that she will come in on 08-26-22 @ 9:30 a.m.  Inform Olivia Crape, RN.

## 2022-08-26 ENCOUNTER — Other Ambulatory Visit (HOSPITAL_COMMUNITY): Payer: 59 | Attending: Psychiatry | Admitting: *Deleted

## 2022-08-26 DIAGNOSIS — F332 Major depressive disorder, recurrent severe without psychotic features: Secondary | ICD-10-CM

## 2022-08-26 NOTE — Progress Notes (Signed)
Patient reported to [facility] for her 12th session of Repetitive Transcranial Magnetic Stimulation treatment for severe episode of recurrent major depressive disorder, without psychotic features. Patient presented with flat affect, depressed mood and denied any suicidal or homicidal ideations.Encouraged her to try to make all remaining appts. No change in alcohol/substance use, caffeine consumption, sleep pattern or metal implant status since previous tx. Pt reported having a good day so far. She attended a funeral of a HS friend this weekend and though it was sad, she was happy to see some of her old friends again. Power was titrated to 120%.

## 2022-08-27 ENCOUNTER — Other Ambulatory Visit (HOSPITAL_COMMUNITY): Payer: 59 | Attending: Psychiatry

## 2022-08-28 ENCOUNTER — Other Ambulatory Visit (HOSPITAL_COMMUNITY): Payer: 59 | Attending: Psychiatry | Admitting: *Deleted

## 2022-08-28 DIAGNOSIS — F332 Major depressive disorder, recurrent severe without psychotic features: Secondary | ICD-10-CM

## 2022-08-28 NOTE — Progress Notes (Signed)
Patient reported to [facility] for her 13th session of Repetitive Transcranial Magnetic Stimulation treatment for severe episode of recurrent major depressive disorder, without psychotic features. Patient presented with flat affect, depressed mood and denied any suicidal or homicidal ideations.Encouraged her to try to make all remaining appts. No change in alcohol/substance use, caffeine consumption, sleep pattern or metal implant status since previous tx. Pt reported having a good day so far. Power was titrated to 120% for the last 10 minutes of treatment.

## 2022-08-29 ENCOUNTER — Other Ambulatory Visit (HOSPITAL_COMMUNITY): Payer: 59 | Attending: Psychiatry | Admitting: *Deleted

## 2022-08-29 ENCOUNTER — Encounter (HOSPITAL_COMMUNITY): Payer: Self-pay | Admitting: Hematology

## 2022-08-29 DIAGNOSIS — F332 Major depressive disorder, recurrent severe without psychotic features: Secondary | ICD-10-CM

## 2022-08-29 NOTE — Progress Notes (Signed)
Patient reported to [facility] for her 14th session of Repetitive Transcranial Magnetic Stimulation treatment for severe episode of recurrent major depressive disorder, without psychotic features. Patient presented with flat affect, depressed mood and denied any suicidal or homicidal ideations.Encouraged her to try to make all remaining appts. No change in alcohol/substance use, caffeine consumption, sleep pattern or metal implant status since previous tx. Pt reported having a good day so far. Power was titrated to 120%. Pt left treatment without complaint.

## 2022-08-30 ENCOUNTER — Encounter (HOSPITAL_COMMUNITY): Payer: Self-pay | Admitting: Hematology

## 2022-08-30 ENCOUNTER — Other Ambulatory Visit (HOSPITAL_COMMUNITY): Payer: 59 | Attending: Psychiatry | Admitting: *Deleted

## 2022-08-30 ENCOUNTER — Encounter: Payer: Self-pay | Admitting: Internal Medicine

## 2022-08-30 ENCOUNTER — Encounter (HOSPITAL_COMMUNITY): Payer: 59

## 2022-08-30 ENCOUNTER — Ambulatory Visit (INDEPENDENT_AMBULATORY_CARE_PROVIDER_SITE_OTHER): Payer: Managed Care, Other (non HMO) | Admitting: Internal Medicine

## 2022-08-30 DIAGNOSIS — R0609 Other forms of dyspnea: Secondary | ICD-10-CM | POA: Diagnosis not present

## 2022-08-30 DIAGNOSIS — F332 Major depressive disorder, recurrent severe without psychotic features: Secondary | ICD-10-CM | POA: Diagnosis not present

## 2022-08-30 NOTE — Progress Notes (Signed)
Patient reported to [facility] for her 15th session of Repetitive Transcranial Magnetic Stimulation treatment for severe episode of recurrent major depressive disorder, without psychotic features. Patient presented with flat affect, but brightened during conversation, depressed mood and denied any suicidal or homicidal ideations.Encouraged her to try to make all remaining appts. No change in alcohol/substance use, caffeine consumption, sleep pattern or metal implant status since previous tx. Pt reported having a good day so far. Power was titrated to 120%. Pt left treatment without complaint. Pt's PHQ-9 today was 23.

## 2022-08-30 NOTE — Patient Instructions (Signed)
You are cleared for kidney surgery   Stop eliquis 2 days prior to surgery then start back as soon as ok by surgeon  Pulmonary follow up is as needed

## 2022-08-30 NOTE — Progress Notes (Unsigned)
Olivia Woodward, female    DOB: 07-Dec-1965    MRN: [id number]   Brief patient profile:  89 yobf never smoker healthy x for wt issues with peak wt 362 then gastric bypass in [place] and then September 2021 with rhinitis/ HA / loss of smell/ cough/ sob with neg covid and never back to baseline so referred to pulmonary clinic in [place]  10/04/2020 by Olivia Woodward    History of Present Illness  10/04/2020  Pulmonary/ 1st office eval/Olivia Woodward  Chief Complaint  Patient presents with   Pulmonary Consult    Referred by Olivia. Tula Woodward. Pt c/o SOB and dizziness since 08/04/20.  She states she can have these symptoms with or without exertion. She also c/o cough with yellow sputum for the past 3 wks. She uses her albuterol inhaler about 3 x per wk.   Dyspnea: loses breath talking / MMRC3 = can't walk 100 yards even at a slow pace at a flat grade s stopping due to sob  Cough: after supper / keeps her up up most nights / mucus discolored / ha worse noct and in am - eval 09/21/20 by ent but focus was on dizziness Sleep:cough wakes her up / 45 degrees electric bed  SABA use: 3 x daily not helping   Rec Augmentin 875 mg take one pill twice daily  X 10 days    S/p Lap chole  05/27/22   Onset R sided pain w/in a couple of weeks of surgery > gone   Sob started with the pain which resolved on R, never had it on L   CT 06/21/22 single subsegmental LLL small PErx eliquis / no echo or venous dopplers done    08/01/2022    re -establish  ov/Olivia Woodward office/Olivia Woodward re: PE maint on eliquis   Chief Complaint  Patient presents with   Follow-up    Continued doe    Dyspnea:  can do grocery shopping all the way back of [employer] pushing cart but only did once in the last 4 weeks / extremely sedentary and wants to be cleared for kidney surgery  Cough: none  Sleeping: bed is flat/ on side / one pillow  SABA use: does not have one  02: none  Covid status: vax  and infected  Rec  To get the most out of  exercise, you need to be continuously aware that you are short of breath, but never out of breath, for at least 30 minutes daily Make sure you check your oxygen saturations at highest level of activity and let us know if falling below 89% consistently Please schedule a follow up office visit in 4 weeks, sooner if needed to see if we can clear you for kidney surgery   - 08/05/2022 venous dopplers neg bilaterally  - 08/07/2022   Echo mild LAE, nl R sided pressures     08/30/2022  f/u ov/Olivia Woodward office/Olivia Woodward re: PE p gb surgery maint on eliquis and needs clearance for kidney surgery  Chief Complaint  Patient presents with   Follow-up    Patients breathing is doing well since last ov   Dyspnea:  not limited including steps one flight  Cough: none  Sleeping: flat bed/ one pillow  SABA use: none  02: none     No obvious day to day or daytime variability or assoc excess/ purulent sputum or mucus plugs or hemoptysis or cp or chest tightness, subjective wheeze or overt sinus or hb symptoms.  Sleeping  without nocturnal  or early am exacerbation  of respiratory  c/o's or need for noct saba. Also denies any obvious fluctuation of symptoms with weather or environmental changes or other aggravating or alleviating factors except as outlined above   No unusual exposure hx or h/o childhood pna/ asthma or knowledge of premature birth.  Current Allergies, Complete Past Medical History, Past Surgical History, Family History, and Social History were reviewed in [employer] record.  ROS  The following are not active complaints unless bolded Hoarseness, sore throat, dysphagia, dental problems, itching, sneezing,  nasal congestion or discharge of excess mucus or purulent secretions, ear ache,   fever, chills, sweats, unintended wt loss or wt gain, classically pleuritic or exertional cp,  orthopnea pnd or arm/hand swelling  or leg swelling, presyncope, palpitations, abdominal pain,  anorexia, nausea, vomiting, diarrhea  or change in bowel habits or change in bladder habits, change in stools or change in urine, dysuria, hematuria,  rash, arthralgias, visual complaints, headache, numbness, weakness or ataxia or problems with walking or coordination,  change in mood or  memory.        Current Meds  Medication Sig   [START ON 09/02/2022] ALPRAZolam (XANAX) 0.25 MG tablet Take 1 tablet (0.25 mg total) by mouth 2 (two) times daily as needed. for anxiety   amLODipine (NORVASC) 10 MG tablet Take 1 tablet (10 mg total) by mouth daily.   apixaban (ELIQUIS) 5 MG TABS tablet Take 1 tablet (5 mg total) by mouth 2 (two) times daily.   [START ON 09/09/2022] cariprazine (VRAYLAR) 3 MG capsule Take 1 capsule (3 mg total) by mouth daily.   Cyanocobalamin (VITAMIN B12) 1000 MCG TBCR Take 2 tablets by mouth once daily   cyclobenzaprine (FLEXERIL) 10 MG tablet Take one tablet by mouth twice daily for 5 days , then as needed, for neck spasm   ergocalciferol (VITAMIN D2) 1.25 MG (50000 UT) capsule Take 1 capsule (50,000 Units total) by mouth once a week. One capsule once weekly   escitalopram (LEXAPRO) 20 MG tablet Take 1 tablet (20 mg total) by mouth daily.   [START ON 09/15/2022] Eszopiclone 3 MG TABS Take 1 tablet (3 mg total) by mouth at bedtime.   hydrocortisone (ANUSOL-HC) 2.5 % rectal cream Place 1 Application rectally 2 (two) times daily.   lamoTRIgine (LAMICTAL) 25 MG tablet Take 4 tablets (100 mg total) by mouth 2 (two) times daily. Week 1 25 mg bid po, Week 2- 25 mg in AM and 50 mg in PM, Week 3- 50 mg bid po, Week 4- 75 mg bid po, Week 5 100 mg bid po   linaclotide (LINZESS) 290 MCG CAPS capsule Take 1 capsule (290 mcg total) by mouth daily before breakfast.   ondansetron (ZOFRAN) 4 MG tablet Take one tablet by mouth once daily, as needed, for nausea   pantoprazole (PROTONIX) 40 MG tablet Take 1 tablet (40 mg total) by mouth daily before breakfast.   propranolol (INDERAL) 20 MG tablet  Take 1 tablet (20 mg total) by mouth 2 (two) times daily.   rizatriptan (MAXALT-MLT) 10 MG disintegrating tablet Take 1 tablet (10 mg total) by mouth as needed for migraine. May repeat in 2 hours if needed   spironolactone (ALDACTONE) 100 MG tablet Take 1 tablet by mouth once daily   topiramate (TOPAMAX) 100 MG tablet Take 100 mg by mouth 2 (two) times daily.  Objective:     08/30/2022     235   08/01/22 231 lb (104.8 kg)  07/30/22 231 lb 8 oz (105 kg)  07/25/22 233 lb (105.7 kg)    Vital signs reviewed  08/30/2022  - Note at rest 02 sats  94% on RA   General appearance:    amb mod obese  bf nad    HEENT : Oropharynx  clear     Nasal turbinates nl    NECK :  without  apparent JVD/ palpable Nodes/TM    LUNGS: no acc muscle use,  Nl contour chest which is clear to A and P bilaterally without cough on insp or exp maneuvers   CV:  RRR  no s3 or murmur or increase in P2, and no edema   ABD:  soft and nontender with nl inspiratory excursion in the supine position. No bruits or organomegaly appreciated   MS:  Nl gait/ ext warm without deformities Or obvious joint restrictions  calf tenderness, cyanosis or clubbing    SKIN: warm and dry without lesions    NEURO:  alert, approp, nl sensorium with  no motor or cerebellar deficits apparent.      Assessment

## 2022-08-31 ENCOUNTER — Encounter: Payer: Self-pay | Admitting: Internal Medicine

## 2022-08-31 NOTE — Assessment & Plan Note (Addendum)
Onset 07/2020 with covid like illness s/p J&J  01/2020  -  10/04/2020   Walked RA  approx   300 ft  @ fast pace  stopped due to  Weak and dizzy and sob with sats 96%   - Allergy profile 10/04/2020 >  Eos 0.4 /  IgE 6 -  Chest CTa  06/21/22    Small subsegmental pulmonary embolus in the left lower lobe, no other major pulm findings > placed on eliquis but no better doe - .08/01/2022   Walked on RA  x  3  lap(s) =  approx 450  ft  @ slow  pace, stopped due to end of study  with lowest 02 sats 98%   - 08/05/2022 venous dopplers neg bilaterally  - 08/07/2022   Echo mild LAE, nl R sided pressures   She is now on eliquis x 2 mo with h/o provoked PE (p GB surgery) and full mobile although mod at risk with no residual dvt or RV issues so is at increased but not prohibitive risk for upcoming renal surgery in the next month.  Ok to stop eliquis x 2 days preop and immediately back on it for at least another month then probably no need to continue beyond a minimum of 3-6 months unless additional risk factors identified.   Encourage regular sub max ex/ pulmonary f/u prn   Discussed in detail all the  indications, usual  risks and alternatives  relative to the benefits with patient who agrees to proceed with Rx as outlined.             Each maintenance medication was reviewed in detail including emphasizing most importantly the difference between maintenance and prns and under what circumstances the prns are to be triggered using an action plan format where appropriate.  Total time for H and P, chart review, counseling,   and generating customized AVS unique to this summary final  office visit / same day charting = 33 min

## 2022-09-02 ENCOUNTER — Other Ambulatory Visit (HOSPITAL_COMMUNITY): Payer: 59 | Attending: Psychiatry | Admitting: *Deleted

## 2022-09-02 DIAGNOSIS — F332 Major depressive disorder, recurrent severe without psychotic features: Secondary | ICD-10-CM

## 2022-09-02 NOTE — Progress Notes (Signed)
Patient reported to [facility] for her 16th session of Repetitive Transcranial Magnetic Stimulation treatment for severe episode of recurrent major depressive disorder, without psychotic features. Patient presented with bright affect, denied any suicidal or homicidal ideations.Encouraged her to try to make all remaining appts. No change in alcohol/substance use, caffeine consumption, sleep pattern or metal implant status since previous tx. Pt reported she had a good weekend and feel that the treatments are starting to work. She feels like she's able to get more done during the day than before treatments. Power was titrated to 120%. Pt left treatment without complaint.

## 2022-09-03 ENCOUNTER — Other Ambulatory Visit (HOSPITAL_COMMUNITY): Payer: 59 | Attending: Psychiatry | Admitting: *Deleted

## 2022-09-03 ENCOUNTER — Ambulatory Visit (INDEPENDENT_AMBULATORY_CARE_PROVIDER_SITE_OTHER): Payer: Managed Care, Other (non HMO) | Admitting: Physician Assistant

## 2022-09-03 VITALS — BP 105/74 | HR 64 | Wt 233.0 lb

## 2022-09-03 DIAGNOSIS — F332 Major depressive disorder, recurrent severe without psychotic features: Secondary | ICD-10-CM

## 2022-09-03 DIAGNOSIS — Q625 Duplication of ureter: Secondary | ICD-10-CM

## 2022-09-03 DIAGNOSIS — R109 Unspecified abdominal pain: Secondary | ICD-10-CM

## 2022-09-03 DIAGNOSIS — N200 Calculus of kidney: Secondary | ICD-10-CM | POA: Diagnosis not present

## 2022-09-03 DIAGNOSIS — N132 Hydronephrosis with renal and ureteral calculous obstruction: Secondary | ICD-10-CM

## 2022-09-03 DIAGNOSIS — N3001 Acute cystitis with hematuria: Secondary | ICD-10-CM | POA: Diagnosis not present

## 2022-09-03 LAB — URINALYSIS, ROUTINE W REFLEX MICROSCOPIC
Bilirubin, UA: NEGATIVE
Glucose, UA: NEGATIVE
Nitrite, UA: NEGATIVE
Specific Gravity, UA: 1.03 (ref 1.005–1.030)
Urobilinogen, Ur: 1 mg/dL (ref 0.2–1.0)
pH, UA: 5.5 (ref 5.0–7.5)

## 2022-09-03 LAB — BLADDER SCAN AMB NON-IMAGING: Scan Result: 0

## 2022-09-03 LAB — MICROSCOPIC EXAMINATION: WBC, UA: >30 /hpf — AB (ref 0–5)

## 2022-09-03 MED ORDER — FLUCONAZOLE 150 MG PO TABS: 150.0000 mg | ORAL_TABLET | Freq: Once | ORAL | 1 refills | Status: AC

## 2022-09-03 MED ORDER — SULFAMETHOXAZOLE-TRIMETHOPRIM 800-160 MG PO TABS: 1.0000 | ORAL_TABLET | Freq: Two times a day (BID) | ORAL | 0 refills | Status: DC

## 2022-09-03 NOTE — Progress Notes (Signed)
Assessment: 1. Renal stones - BLADDER SCAN AMB NON-IMAGING - Urinalysis, Routine w reflex microscopic  2. Hydronephrosis with urinary obstruction due to renal calculus  3. Duplicated left renal collecting system  4. Flank pain    Plan: ***  No orders of the defined types were placed in this encounter.    Chief Complaint: No chief complaint on file.   HPI: Olivia Woodward is a 56 y.o. female with h/o dual collecting system with obstructing stone in the left kidney, UTIs, and PE in August who presents for evaluation of increased right sided flank pain for the past few weeks. Left flank pain is unchanged and remains constant.. Patient states she has recently been cleared to hold Eliquis for 2 days in order to have nephrostomy and PCNL versus nephrectomy procedure. UA= PVR=79m   07/19/31 Consult requested by Dr. MTula Nakayama   Olivia Woodward a 56yo female who is sent by Dr. SMoshe Ciprofor a 2.8cm stone with obstruction of the LLP of a duplicated system.  A few other stones are noted in the upper pole as well.  There is marked cortical atrophy.  This was seen on CT's from 05/30/22 and 06/21/22.  She had a cholecystectomy on 05/27/22.  She was back in the ER on 8/4 and was found to have a small pulmonary embolus and is currently on Eliquis.  She was diagnosed with a UTI as well and was given Keflex.  She had staph on a culture on 06/21/22 and also in 12/25/16.   I don't find prior renal imaging for full comparison.  She is on Topomax.   She had a gastric bypass in 2007.  She has no prior history of stones.  She hasn't had many UTI's.  She has had some left flank pain for about 2 months.  The pain was sharp with nausea.  She is feeling ok today. She has no significant voiding complaints.     Portions of the above documentation were copied from a prior visit for review purposes only.  Allergies: Allergies  Allergen Reactions   Ketorolac Tromethamine Hives    Lips swelled    PMH: Past  Medical History:  Diagnosis Date   Anemia    Anxiety    Phreesia 03/18/2021   Depression    Phreesia 03/18/2021   Generalized headaches    Hypertension     PSH: Past Surgical History:  Procedure Laterality Date   ABDOMINAL HYSTERECTOMY     fibroids, partial   BREAST REDUCTION SURGERY  2005   CHOLECYSTECTOMY N/A 05/27/2022   Procedure: LAPAROSCOPIC CHOLECYSTECTOMY;  Surgeon: JAviva Signs MD;  Location: [facility];  Service: General;  Laterality: N/A;   COLONOSCOPY WITH PROPOFOL N/A 05/10/2019   Two 5 to 6 mm polyps in the sigmoid colon and in the ascending colon, removed with a   GASTRIC BYPASS  2007   POLYPECTOMY  05/10/2019   Procedure: POLYPECTOMY;  Surgeon: RDaneil Dolin MD;  Location: [facility];  Service: Endoscopy;;  colon    SH: Social History   Tobacco Use   Smoking status: Never   Smokeless tobacco: Never  Vaping Use   Vaping Use: Never used  Substance Use Topics   Alcohol use: No   Drug use: No    ROS: All other review of systems were reviewed and are negative except what is noted above in HPI  PE: BP 105/74   Pulse 64   Wt 233 lb (105.7 kg)   BMI 37.61  kg/m  GENERAL APPEARANCE:  Well appearing, well developed, well nourished, NAD HEENT:  Atraumatic, normocephalic NECK:  Supple. Trachea midline ABDOMEN:  Soft, non-tender, no masses EXTREMITIES:  Moves all extremities well, without clubbing, cyanosis, or edema NEUROLOGIC:  Alert and oriented x 3, normal gait, CN II-XII grossly intact MENTAL STATUS:  appropriate BACK:  Non-tender to palpation, No CVAT SKIN:  Warm, dry, and intact   Results: Laboratory Data: Lab Results  Component Value Date   WBC 6.1 08/01/2022   HGB 13.6 08/01/2022   HCT 42.2 08/01/2022   MCV 97 08/01/2022   PLT 325 08/01/2022    Lab Results  Component Value Date   CREATININE 1.18 (H) 06/21/2022      Lab Results  Component Value Date   HGBA1C 5.4 03/22/2021    Urinalysis    Component Value Date/Time    COLORURINE AMBER (A) 06/21/2022 1828   APPEARANCEUR Cloudy (A) 07/18/2022 1458   LABSPEC >1.046 (H) 06/21/2022 1828   PHURINE 6.0 06/21/2022 1828   GLUCOSEU Negative 07/18/2022 1458   HGBUR SMALL (A) 06/21/2022 1828   BILIRUBINUR Negative 07/18/2022 [place] 06/21/2022 1828   PROTEINUR 3+ (A) 07/18/2022 1458   PROTEINUR 100 (A) 06/21/2022 1828   UROBILINOGEN 4.0 01/02/2017 1524   NITRITE Negative 07/18/2022 1458   NITRITE NEGATIVE 06/21/2022 1828   LEUKOCYTESUR 3+ (A) 07/18/2022 1458   LEUKOCYTESUR LARGE (A) 06/21/2022 1828    Lab Results  Component Value Date   LABMICR See below: 07/18/2022   WBCUA >30 (A) 07/18/2022   LABEPIT None seen 07/18/2022   BACTERIA Many (A) 07/18/2022    Pertinent Imaging: No results found for this or any previous visit.  No results found for this or any previous visit.  No results found for this or any previous visit.  No results found for this or any previous visit.  No results found for this or any previous visit.  No valid procedures specified. No results found for this or any previous visit.  No results found for this or any previous visit.  No results found for this or any previous visit (from the past 24 hour(s)).

## 2022-09-03 NOTE — Progress Notes (Signed)
Patient reported to [facility] for her 17th session of Repetitive Transcranial Magnetic Stimulation treatment for severe episode of recurrent major depressive disorder, without psychotic features. Patient presented with bright affect, denied any suicidal or homicidal ideations.Encouraged her to try to make all remaining appts. No change in alcohol/substance use, caffeine consumption, sleep pattern or metal implant status since previous tx. Pt reported that her daughter has noticed an improvement in her mood. Daughter mentioned that she spending more time out of her bedroom and interacting more. Pt feels like she's able to get more done during the day than before treatments. Power was titrated to 120%. Pt left treatment without complaint.

## 2022-09-04 ENCOUNTER — Telehealth: Payer: Self-pay

## 2022-09-04 ENCOUNTER — Other Ambulatory Visit (HOSPITAL_COMMUNITY): Payer: 59 | Attending: Psychiatry | Admitting: *Deleted

## 2022-09-04 DIAGNOSIS — F332 Major depressive disorder, recurrent severe without psychotic features: Secondary | ICD-10-CM

## 2022-09-04 NOTE — Progress Notes (Signed)
Patient reported to [facility] for her 18th session of Repetitive Transcranial Magnetic Stimulation treatment for severe episode of recurrent major depressive disorder, without psychotic features. Patient presented with bright affect, denied any suicidal or homicidal ideations.Encouraged her to try to make all remaining appts. No change in alcohol/substance use, caffeine consumption, sleep pattern or metal implant status since previous tx.  Power was titrated to 120%. Pt left treatment without complaint.

## 2022-09-04 NOTE — Telephone Encounter (Signed)
-----   Message from Reynaldo Minium, [place] sent at 09/04/2022 10:38 AM EDT ----- Please let pt know I discussed her images with Dr. Alyson Ingles who can do the surgery here at [facility]. Please set pt up for appt with Dr. Alyson Ingles to go over the surgery. Thanks tons!

## 2022-09-04 NOTE — Telephone Encounter (Signed)
Patient called and scheduled appointment with Dr. Alyson Ingles for next Wednesday.  Patient voiced understanding.

## 2022-09-05 ENCOUNTER — Other Ambulatory Visit (HOSPITAL_COMMUNITY): Payer: 59 | Attending: Psychiatry | Admitting: *Deleted

## 2022-09-05 DIAGNOSIS — F332 Major depressive disorder, recurrent severe without psychotic features: Secondary | ICD-10-CM

## 2022-09-05 NOTE — Progress Notes (Signed)
Patient reported to [facility] for her 19th session of Repetitive Transcranial Magnetic Stimulation treatment for severe episode of recurrent major depressive disorder, without psychotic features. Patient presented with bright affect, denied any suicidal or homicidal ideations.Encouraged her to try to make all remaining appts. Pt reports she's looking forward to having her hair done today. Continues to report that she is feeling the benefits of the treatments.  No change in alcohol/substance use, caffeine consumption, sleep pattern or metal implant status since previous tx.  Power was titrated to 120%. Pt left treatment without complaint.

## 2022-09-06 ENCOUNTER — Other Ambulatory Visit (HOSPITAL_COMMUNITY): Payer: 59 | Attending: Psychiatry | Admitting: *Deleted

## 2022-09-06 ENCOUNTER — Telehealth (HOSPITAL_COMMUNITY): Payer: Self-pay | Admitting: Psychiatry

## 2022-09-06 LAB — URINE CULTURE

## 2022-09-09 ENCOUNTER — Other Ambulatory Visit (HOSPITAL_COMMUNITY): Payer: 59 | Attending: Psychiatry

## 2022-09-09 ENCOUNTER — Encounter (HOSPITAL_COMMUNITY): Payer: Self-pay

## 2022-09-09 DIAGNOSIS — F332 Major depressive disorder, recurrent severe without psychotic features: Secondary | ICD-10-CM | POA: Diagnosis not present

## 2022-09-09 NOTE — Progress Notes (Signed)
Patient reported to [facility] for her 20th session of Repetitive Transcranial Magnetic Stimulation treatment for severe episode of recurrent major depressive disorder, without psychotic features. Patient presented with bright affect, level mood, and denied any suicidal or homicidal ideations. Patient reported she had to miss appointment 09/06/22 due to her Mother was admitted to the hospital for high blood pressure.  Encouraged patient to try to make all remaining appts. Pt reported she is going to get her car inspected today and scored an 18 on hr PHQ2/9 administered today.  Patient continues to report that she is feeling the benefits of treatments and that she and her family have noticed an improvement in mood.  No change in alcohol/substance use, caffeine consumption, sleep pattern or metal implant status since previous treatment.  Power was titrated to 120%.  Patient left treatment without complaint of any pain or discomfort.

## 2022-09-10 ENCOUNTER — Other Ambulatory Visit (HOSPITAL_COMMUNITY): Payer: 59 | Attending: Psychiatry

## 2022-09-10 ENCOUNTER — Telehealth: Payer: Self-pay

## 2022-09-10 ENCOUNTER — Encounter (HOSPITAL_COMMUNITY): Payer: Self-pay

## 2022-09-10 DIAGNOSIS — F332 Major depressive disorder, recurrent severe without psychotic features: Secondary | ICD-10-CM | POA: Diagnosis not present

## 2022-09-10 NOTE — Progress Notes (Signed)
Patient reported to [facility] for her 21st session of Repetitive Transcranial Magnetic Stimulation treatment for severe episode of recurrent major depressive disorder, without psychotic features. Patient presented with appropriate affect, level mood, and denied any suicidal or homicidal ideations.  Patient reported no current pain or discomfort, headaches or other issues with ongoing TMS treatments.  Patient continues to report noticing the benefits of treatments. No change in alcohol/substance use, caffeine consumption, sleep pattern or metal implant status since previous treatment.  Power was titrated to 120%.  Patient left treatment without complaint of any pain or discomfort with plans to return on tomorrow for her next treatment session.

## 2022-09-10 NOTE — Progress Notes (Deleted)
Referring Provider: Fayrene Helper, MD Primary Care Physician:  Fayrene Helper, MD Primary GI Physician: Dr. Gala Romney  No chief complaint on file.   HPI:   Olivia Woodward is a 56 y.o. female with history of IDA now resolved, colonic adenomas with colonoscopy due in 2025, gastric bypass in 2007, GERD, constipation, subsegmental pulmonary embolism diagnosed 06/21/22 and started on Eliquis, presenting today for follow-up of constipation, RUQ abdominal pain, and early satiety.   Last seen in our office 07/18/2022.  Chronic GERD well controlled on pantoprazole 20 mg daily.  Reported worsening early satiety, decreased appetite, and persistent tenderness in the RUQ region since cholecystectomy in July.  Denied postprandial abdominal pain, nausea or vomiting.  Nausea prior to cholecystectomy had resolved.  She also described nocturnal pain that started in her right shoulder, radiating down to her right chest, into her right upper quadrant with sensation of breathing getting cut off hat resolved when sitting up. Recent CT A/P with contrast with small residual fluid in the gallbladder fossa, but overall improving.  LFTs were normal.  Had a follow-up appointment with Dr. Arnoldo Morale later the same day due to persistent RUQ pain.  Planned to follow-up on his recommendations and if he felt symptoms were not related to surgical complication, would proceed with EGD for further evaluation once anticoagulation for PE could be safely interrupted.  Recommended increasing Protonix to 40 mg daily for now.  Also recommended discussing nocturnal symptoms with pulmonology and PCP as they did not sound GI related.  Regarding her constipation, this was not adequately managed on MiraLAX and she had also had some intermittent toilet tissue hematochezia in the setting of hemorrhoids.  Linzess 290 mcg previously worked well, but was too expensive.  Planned to resend Linzess to the pharmacy and provided discount card. Anusol  rectal cream also prescribed.  Recommended 55-monthfollow-up, but patient will let me know if she was cleared by pulmonology sooner for EGD.  Patient had follow-up with Dr. JArnoldo Moralewho stated her findings were consistent with normal postoperative cholecystectomy abdomen.  Could not explain ongoing decreased appetite and recommended follow-up with GI.  Office visit with pulmonology 08/30/2022.  Stated patient was okay to stop Eliquis x2 days prior to upcoming renal surgery and immediately start back for at least another month, then probably no need to continue beyond a minimum of 3 to 6 months unless additional risk factors identified.  Today:   GERD:   Early satiety:   RUQ Abdominal pain:   Constipation:    Upcoming renal surgery:  Past Medical History:  Diagnosis Date   Anemia    Anxiety    Phreesia 03/18/2021   Depression    Phreesia 03/18/2021   Generalized headaches    Hypertension     Past Surgical History:  Procedure Laterality Date   ABDOMINAL HYSTERECTOMY     fibroids, partial   BREAST REDUCTION SURGERY  2005   CHOLECYSTECTOMY N/A 05/27/2022   Procedure: LAPAROSCOPIC CHOLECYSTECTOMY;  Surgeon: JAviva Signs MD;  Location: [facility];  Service: General;  Laterality: N/A;   COLONOSCOPY WITH PROPOFOL N/A 05/10/2019   Two 5 to 6 mm polyps in the sigmoid colon and in the ascending colon, removed with a   GASTRIC BYPASS  2007   POLYPECTOMY  05/10/2019   Procedure: POLYPECTOMY;  Surgeon: RDaneil Dolin MD;  Location: [facility];  Service: Endoscopy;;  colon    Current Outpatient Medications  Medication Sig Dispense Refill   ALPRAZolam (Duanne Moron  0.25 MG tablet Take 1 tablet (0.25 mg total) by mouth 2 (two) times daily as needed. for anxiety 60 tablet 0   amLODipine (NORVASC) 10 MG tablet Take 1 tablet (10 mg total) by mouth daily. 90 tablet 1   apixaban (ELIQUIS) 5 MG TABS tablet Take 1 tablet (5 mg total) by mouth 2 (two) times daily. 60 tablet 3   cariprazine  (VRAYLAR) 3 MG capsule Take 1 capsule (3 mg total) by mouth daily. 30 capsule 1   Cyanocobalamin (VITAMIN B12) 1000 MCG TBCR Take 2 tablets by mouth once daily 180 tablet 2   cyclobenzaprine (FLEXERIL) 10 MG tablet Take one tablet by mouth twice daily for 5 days , then as needed, for neck spasm 30 tablet 0   ergocalciferol (VITAMIN D2) 1.25 MG (50000 UT) capsule Take 1 capsule (50,000 Units total) by mouth once a week. One capsule once weekly 12 capsule 2   escitalopram (LEXAPRO) 20 MG tablet Take 1 tablet (20 mg total) by mouth daily. 30 tablet 3   [START ON 09/15/2022] Eszopiclone 3 MG TABS Take 1 tablet (3 mg total) by mouth at bedtime. 30 tablet 0   hydrocortisone (ANUSOL-HC) 2.5 % rectal cream Place 1 Application rectally 2 (two) times daily. 30 g 1   lamoTRIgine (LAMICTAL) 25 MG tablet Take 4 tablets (100 mg total) by mouth 2 (two) times daily. Week 1 25 mg bid po, Week 2- 25 mg in AM and 50 mg in PM, Week 3- 50 mg bid po, Week 4- 75 mg bid po, Week 5 100 mg bid po 180 tablet 0   linaclotide (LINZESS) 290 MCG CAPS capsule Take 1 capsule (290 mcg total) by mouth daily before breakfast. 30 capsule 5   ondansetron (ZOFRAN) 4 MG tablet Take one tablet by mouth once daily, as needed, for nausea 30 tablet 2   pantoprazole (PROTONIX) 40 MG tablet Take 1 tablet (40 mg total) by mouth daily before breakfast. 30 tablet 3   propranolol (INDERAL) 20 MG tablet Take 1 tablet (20 mg total) by mouth 2 (two) times daily. 60 tablet 5   rizatriptan (MAXALT-MLT) 10 MG disintegrating tablet Take 1 tablet (10 mg total) by mouth as needed for migraine. May repeat in 2 hours if needed 10 tablet 3   spironolactone (ALDACTONE) 100 MG tablet Take 1 tablet by mouth once daily 30 tablet 0   sulfamethoxazole-trimethoprim (BACTRIM DS) 800-160 MG tablet Take 1 tablet by mouth 2 (two) times daily. 14 tablet 0   topiramate (TOPAMAX) 100 MG tablet Take 100 mg by mouth 2 (two) times daily.     No current facility-administered  medications for this visit.    Allergies as of 09/12/2022 - Review Complete 09/10/2022  Allergen Reaction Noted   Ketorolac tromethamine Hives 03/16/2009    Family History  Problem Relation Age of Onset   Hypertension Mother    Diabetes Mother    Drug abuse Mother    Hypertension Father    Hypertension Sister    Hypertension Sister    Colon cancer Neg Hx     Social History   Socioeconomic History   Marital status: Single    Spouse name: Not on file   Number of children: 2   Years of education: 12   Highest education level: Not on file  Occupational History   Not on file  Tobacco Use   Smoking status: Never   Smokeless tobacco: Never  Vaping Use   Vaping Use: Never used  Substance and  Sexual Activity   Alcohol use: No   Drug use: No   Sexual activity: Yes    Birth control/protection: Condom  Other Topics Concern   Not on file  Social History Narrative   Right handed   Caffeine use: tea/soda twice weekly   Social Determinants of Health   Financial Resource Strain: Not on file  Food Insecurity: Not on file  Transportation Needs: Not on file  Physical Activity: Not on file  Stress: Not on file  Social Connections: Not on file    Review of Systems: Gen: Denies fever, chills, cold or flu like symptoms, pre-syncope, or syncope.  CV: Denies chest pain, palpitations. Resp: Denies dyspnea, cough.  GI: See HPI Heme: See HPI  Physical Exam: There were no vitals taken for this visit. General:   Alert and oriented. No distress noted. Pleasant and cooperative.  Head:  Normocephalic and atraumatic. Eyes:  Conjuctiva clear without scleral icterus. Heart:  S1, S2 present without murmurs appreciated. Lungs:  Clear to auscultation bilaterally. No wheezes, rales, or rhonchi. No distress.  Abdomen:  +BS, soft, non-tender and non-distended. No rebound or guarding. No HSM or masses noted. Msk:  Symmetrical without gross deformities. Normal posture. Extremities:  Without  edema. Neurologic:  Alert and  oriented x4 Psych:  Normal mood and affect.    Assessment:  56 year old female with history of IDA now resolved, colonic adenomas with colonoscopy due in 2025, gastric bypass in 2007, GERD, constipation, subsegmental pulmonary embolism diagnosed 06/21/2022 and started on Eliquis, presenting today for follow-up of constipation, early satiety, RUQ abdominal pain.   Constipation:   RUQ Abdominal Pain:   Early satiety:   GERD:   Plan:  ***   Aliene Altes, PA-C [facility] 09/12/2022

## 2022-09-10 NOTE — Telephone Encounter (Signed)
Left detailed message informing patient

## 2022-09-10 NOTE — Telephone Encounter (Signed)
-----   Message from [facility] [place] sent at 09/09/2022  8:54 AM EDT ----- Please let pt know her urine cx is positive and the Bactrim she was Rx covers the bacteria. No additional or change in tx at this time. Keep upcoming appt with Dr. Alyson Ingles ----- Message ----- From: Interface, [facility] Lab Results In Sent: 09/03/2022   3:36 PM EDT To: Reynaldo Minium, PA-C

## 2022-09-11 ENCOUNTER — Encounter (HOSPITAL_COMMUNITY): Payer: Self-pay

## 2022-09-11 ENCOUNTER — Other Ambulatory Visit (HOSPITAL_COMMUNITY): Payer: 59 | Attending: Psychiatry

## 2022-09-11 ENCOUNTER — Ambulatory Visit (INDEPENDENT_AMBULATORY_CARE_PROVIDER_SITE_OTHER): Payer: Managed Care, Other (non HMO) | Admitting: Urology

## 2022-09-11 VITALS — BP 105/73 | HR 71

## 2022-09-11 DIAGNOSIS — N201 Calculus of ureter: Secondary | ICD-10-CM | POA: Diagnosis not present

## 2022-09-11 DIAGNOSIS — F332 Major depressive disorder, recurrent severe without psychotic features: Secondary | ICD-10-CM | POA: Diagnosis not present

## 2022-09-11 DIAGNOSIS — N132 Hydronephrosis with renal and ureteral calculous obstruction: Secondary | ICD-10-CM

## 2022-09-11 DIAGNOSIS — N1330 Unspecified hydronephrosis: Secondary | ICD-10-CM

## 2022-09-11 NOTE — H&P (View-Only) (Signed)
09/11/2022 3:46 PM   Olivia Woodward 10/25/1966 [id number]  Referring provider: Fayrene Helper, MD [address], [place],  [place] [address]  Left nonfunctioning kidney   HPI: Olivia Woodward is a 56yo here for evaluation of left hydronephrosis in a chronically obstructed left kidney. She has mild left flank pain for several years. She developed a PE 3 months ago and is currently on Eliquis. She underwent CT which showed severe left lower pole hydronephrosis due to a 3cm left UPJ calculus. Creatinine 1.1. CT abd/pelvis 06/21/2022 shows severe left lower pole hydronephrosis and renal atrophy. 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    PMH: Past Medical History:  Diagnosis Date   Anemia    Anxiety    Phreesia 03/18/2021   Depression    Phreesia 03/18/2021   Generalized headaches    Hypertension     Surgical History: Past Surgical History:  Procedure Laterality Date   ABDOMINAL HYSTERECTOMY     fibroids, partial   BREAST REDUCTION SURGERY  2005   CHOLECYSTECTOMY N/A 05/27/2022   Procedure: LAPAROSCOPIC CHOLECYSTECTOMY;  Surgeon: Aviva Signs, MD;  Location: [facility];  Service: General;  Laterality: N/A;   COLONOSCOPY WITH PROPOFOL N/A 05/10/2019   Two 5 to 6 mm polyps in the sigmoid colon and in the ascending colon, removed with a   GASTRIC BYPASS  2007   POLYPECTOMY  05/10/2019   Procedure: POLYPECTOMY;  Surgeon: Daneil Dolin, MD;  Location: [facility];  Service: Endoscopy;;  colon    Home Medications:  Allergies as of 09/11/2022       Reactions   Ketorolac Tromethamine Hives   Lips swelled        Medication List        Accurate as of September 11, 2022  3:46 PM. If you have any questions, ask your nurse or doctor.          ALPRAZolam 0.25 MG tablet Commonly known as: XANAX Take 1 tablet (0.25 mg total) by mouth 2 (two) times daily as needed. for anxiety   amLODipine 10 MG tablet Commonly known as:  NORVASC Take 1 tablet (10 mg total) by mouth daily.   apixaban 5 MG Tabs tablet Commonly known as: Eliquis Take 1 tablet (5 mg total) by mouth 2 (two) times daily.   cariprazine 3 MG capsule Commonly known as: Vraylar Take 1 capsule (3 mg total) by mouth daily.   cyclobenzaprine 10 MG tablet Commonly known as: FLEXERIL Take one tablet by mouth twice daily for 5 days , then as needed, for neck spasm   ergocalciferol 1.25 MG (50000 UT) capsule Commonly known as: VITAMIN D2 Take 1 capsule (50,000 Units total) by mouth once a week. One capsule once weekly   escitalopram 20 MG tablet Commonly known as: Lexapro Take 1 tablet (20 mg total) by mouth daily.   Eszopiclone 3 MG Tabs Take 1 tablet (3 mg total) by mouth at bedtime. Start taking on: September 15, 2022   hydrocortisone 2.5 % rectal cream Commonly known as: ANUSOL-HC Place 1 Application rectally 2 (two) times daily.   lamoTRIgine 25 MG tablet Commonly known as: LaMICtal Take 4 tablets (100 mg total) by mouth 2 (two) times daily. Week 1 25 mg bid po, Week 2- 25 mg in AM and 50 mg in PM, Week 3- 50 mg bid po, Week 4- 75 mg bid po, Week 5 100 mg bid po   linaclotide 290  MCG Caps capsule Commonly known as: Linzess Take 1 capsule (290 mcg total) by mouth daily before breakfast.   ondansetron 4 MG tablet Commonly known as: Zofran Take one tablet by mouth once daily, as needed, for nausea   pantoprazole 40 MG tablet Commonly known as: PROTONIX Take 1 tablet (40 mg total) by mouth daily before breakfast.   propranolol 20 MG tablet Commonly known as: INDERAL Take 1 tablet (20 mg total) by mouth 2 (two) times daily.   rizatriptan 10 MG disintegrating tablet Commonly known as: Maxalt-MLT Take 1 tablet (10 mg total) by mouth as needed for migraine. May repeat in 2 hours if needed   spironolactone 100 MG tablet Commonly known as: ALDACTONE Take 1 tablet by mouth once daily   sulfamethoxazole-trimethoprim 800-160 MG  tablet Commonly known as: BACTRIM DS Take 1 tablet by mouth 2 (two) times daily.   topiramate 100 MG tablet Commonly known as: TOPAMAX Take 100 mg by mouth 2 (two) times daily.   Vitamin B12 1000 MCG Tbcr Take 2 tablets by mouth once daily        Allergies:  Allergies  Allergen Reactions   Ketorolac Tromethamine Hives    Lips swelled    Family History: Family History  Problem Relation Age of Onset   Hypertension Mother    Diabetes Mother    Drug abuse Mother    Hypertension Father    Hypertension Sister    Hypertension Sister    Colon cancer Neg Hx     Social History:  reports that she has never smoked. She has never used smokeless tobacco. She reports that she does not drink alcohol and does not use drugs.  ROS: All other review of systems were reviewed and are negative except what is noted above in HPI  Physical Exam: BP 105/73   Pulse 71   Constitutional:  Alert and oriented, No acute distress. HEENT: [place] AT, moist mucus membranes.  Trachea midline, no masses. Cardiovascular: No clubbing, cyanosis, or edema. Respiratory: Normal respiratory effort, no increased work of breathing. GI: Abdomen is soft, nontender, nondistended, no abdominal masses GU: No CVA tenderness.  Lymph: No cervical or inguinal lymphadenopathy. Skin: No rashes, bruises or suspicious lesions. Neurologic: Grossly intact, no focal deficits, moving all 4 extremities. Psychiatric: Normal mood and affect.  Laboratory Data: Lab Results  Component Value Date   WBC 6.1 08/01/2022   HGB 13.6 08/01/2022   HCT 42.2 08/01/2022   MCV 97 08/01/2022   PLT 325 08/01/2022    Lab Results  Component Value Date   CREATININE 1.18 (H) 06/21/2022    No results found for: "PSA"  No results found for: "TESTOSTERONE"  Lab Results  Component Value Date   HGBA1C 5.4 03/22/2021    Urinalysis    Component Value Date/Time   COLORURINE AMBER (A) 06/21/2022 1828   APPEARANCEUR Cloudy (A) 09/03/2022  1441   LABSPEC >1.046 (H) 06/21/2022 1828   PHURINE 6.0 06/21/2022 1828   GLUCOSEU Negative 09/03/2022 1441   HGBUR SMALL (A) 06/21/2022 1828   BILIRUBINUR Negative 09/03/2022 1441   KETONESUR NEGATIVE 06/21/2022 1828   PROTEINUR 3+ (A) 09/03/2022 1441   PROTEINUR 100 (A) 06/21/2022 1828   UROBILINOGEN 4.0 01/02/2017 1524   NITRITE Negative 09/03/2022 1441   NITRITE NEGATIVE 06/21/2022 1828   LEUKOCYTESUR 3+ (A) 09/03/2022 1441   LEUKOCYTESUR LARGE (A) 06/21/2022 1828    Lab Results  Component Value Date   LABMICR See below: 09/03/2022   WBCUA >30 (A) 09/03/2022  LABEPIT 0-10 09/03/2022   BACTERIA Moderate (A) 09/03/2022    Pertinent Imaging: Ct 06/21/2202: Images reviewed and discussed with the patient No results found for this or any previous visit.  No results found for this or any previous visit.  No results found for this or any previous visit.  No results found for this or any previous visit.  No results found for this or any previous visit.  No valid procedures specified. No results found for this or any previous visit.  No results found for this or any previous visit.   Assessment & Plan:    1. Hydronephrosis with urinary obstruction due to renal calculus -We discussed the management of a chronically obstructed kidney including left PCNL, left partial nephrectomy and left simple nephrectomy. After discussing the options the patient elects for robotic left simple nephrectomy. Risks/benefits/alternatives discussed. We will obtain clearance to stop Eliquis 2 days prior to surgery - Urinalysis, Routine w reflex microscopic   No follow-ups on file.  Nicolette Bang, MD  [facility] [place]

## 2022-09-11 NOTE — Progress Notes (Signed)
Patient reported to [facility] for her 22nd session of Repetitive Transcranial Magnetic Stimulation treatment for severe episode of recurrent major depressive disorder, without psychotic features. Patient presented with appropriate affect, level mood, and denied any suicidal or homicidal ideations with no plan, intent, or stated means of wanting to harm self or others.  Patient reported no current pain or discomfort, headaches or other issues with ongoing TMS treatments.  Patient continues to report noticed benefits of [place] treatments. No change in alcohol/substance use, caffeine consumption, sleep pattern or metal implant status since previous treatment.  Power was titrated to 120% and patient tolerated session without any complaint of pain or discomfort.  Patient had one interruption, during session as she got a phone call from her son who was not feeling well but completed session without any issues and plan to return for next session 09/12/22.

## 2022-09-11 NOTE — Progress Notes (Signed)
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 She has mild left flank pain for several years. She developed a PE 3 months ago and is currently on Eliquis. She underwent CT which showed severe left lower pole hydronephrosis due to a 3cm left UPJ calculus. Creatinine 1.1. CT abd/pelvis 06/21/2022 shows severe left lower pole hydronephrosis and renal atrophy. 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    PMH: Past Medical History:  Diagnosis Date   Anemia    Anxiety    Phreesia 03/18/2021   Depression    Phreesia 03/18/2021   Generalized headaches    Hypertension     Surgical History: Past Surgical History:  Procedure Laterality Date   ABDOMINAL HYSTERECTOMY     fibroids, partial   BREAST REDUCTION SURGERY  2005   CHOLECYSTECTOMY N/A 05/27/2022   Procedure: LAPAROSCOPIC CHOLECYSTECTOMY;  Surgeon: Aviva Signs, MD;  Location: [facility];  Service: General;  Laterality: N/A;   COLONOSCOPY WITH PROPOFOL N/A 05/10/2019   Two 5 to 6 mm polyps in the sigmoid colon and in the ascending colon, removed with a   GASTRIC BYPASS  2007   POLYPECTOMY  05/10/2019   Procedure: POLYPECTOMY;  Surgeon: Daneil Dolin, MD;  Location: [facility];  Service: Endoscopy;;  colon    Home Medications:  Allergies as of 09/11/2022       Reactions   Ketorolac Tromethamine Hives   Lips swelled        Medication List        Accurate as of September 11, 2022  3:46 PM. If you have any questions, ask your nurse or doctor.          ALPRAZolam 0.25 MG tablet Commonly known as: XANAX Take 1 tablet (0.25 mg total) by mouth 2 (two) times daily as needed. for anxiety   amLODipine 10 MG tablet Commonly known as:  NORVASC Take 1 tablet (10 mg total) by mouth daily.   apixaban 5 MG Tabs tablet Commonly known as: Eliquis Take 1 tablet (5 mg total) by mouth 2 (two) times daily.   cariprazine 3 MG capsule Commonly known as: Vraylar Take 1 capsule (3 mg total) by mouth daily.   cyclobenzaprine 10 MG tablet Commonly known as: FLEXERIL Take one tablet by mouth twice daily for 5 days , then as needed, for neck spasm   ergocalciferol 1.25 MG (50000 UT) capsule Commonly known as: VITAMIN D2 Take 1 capsule (50,000 Units total) by mouth once a week. One capsule once weekly   escitalopram 20 MG tablet Commonly known as: Lexapro Take 1 tablet (20 mg total) by mouth daily.   Eszopiclone 3 MG Tabs Take 1 tablet (3 mg total) by mouth at bedtime. Start taking on: September 15, 2022   hydrocortisone 2.5 % rectal cream Commonly known as: ANUSOL-HC Place 1 Application rectally 2 (two) times daily.   lamoTRIgine 25 MG tablet Commonly known as: LaMICtal Take 4 tablets (100 mg total) by mouth 2 (two) times daily. Week 1 25 mg bid po, Week 2- 25 mg in AM and 50 mg in PM, Week 3- 50 mg bid po, Week 4- 75 mg bid po, Week 5 100 mg bid po   linaclotide 290  MCG Caps capsule Commonly known as: Linzess Take 1 capsule (290 mcg total) by mouth daily before breakfast.   ondansetron 4 MG tablet Commonly known as: Zofran Take one tablet by mouth once daily, as needed, for nausea   pantoprazole 40 MG tablet Commonly known as: PROTONIX Take 1 tablet (40 mg total) by mouth daily before breakfast.   propranolol 20 MG tablet Commonly known as: INDERAL Take 1 tablet (20 mg total) by mouth 2 (two) times daily.   rizatriptan 10 MG disintegrating tablet Commonly known as: Maxalt-MLT Take 1 tablet (10 mg total) by mouth as needed for migraine. May repeat in 2 hours if needed   spironolactone 100 MG tablet Commonly known as: ALDACTONE Take 1 tablet by mouth once daily   sulfamethoxazole-trimethoprim 800-160 MG  tablet Commonly known as: BACTRIM DS Take 1 tablet by mouth 2 (two) times daily.   topiramate 100 MG tablet Commonly known as: TOPAMAX Take 100 mg by mouth 2 (two) times daily.   Vitamin B12 1000 MCG Tbcr Take 2 tablets by mouth once daily        Allergies:  Allergies  Allergen Reactions   Ketorolac Tromethamine Hives    Lips swelled    Family History: Family History  Problem Relation Age of Onset   Hypertension Mother    Diabetes Mother    Drug abuse Mother    Hypertension Father    Hypertension Sister    Hypertension Sister    Colon cancer Neg Hx     Social History:  reports that she has never smoked. She has never used smokeless tobacco. She reports that she does not drink alcohol and does not use drugs.  ROS: All other review of systems were reviewed and are negative except what is noted above in HPI  Physical Exam: BP 105/73   Pulse 71   Constitutional:  Alert and oriented, No acute distress. HEENT: [place] AT, moist mucus membranes.  Trachea midline, no masses. Cardiovascular: No clubbing, cyanosis, or edema. Respiratory: Normal respiratory effort, no increased work of breathing. GI: Abdomen is soft, nontender, nondistended, no abdominal masses GU: No CVA tenderness.  Lymph: No cervical or inguinal lymphadenopathy. Skin: No rashes, bruises or suspicious lesions. Neurologic: Grossly intact, no focal deficits, moving all 4 extremities. Psychiatric: Normal mood and affect.  Laboratory Data: Lab Results  Component Value Date   WBC 6.1 08/01/2022   HGB 13.6 08/01/2022   HCT 42.2 08/01/2022   MCV 97 08/01/2022   PLT 325 08/01/2022    Lab Results  Component Value Date   CREATININE 1.18 (H) 06/21/2022    No results found for: "PSA"  No results found for: "TESTOSTERONE"  Lab Results  Component Value Date   HGBA1C 5.4 03/22/2021    Urinalysis    Component Value Date/Time   COLORURINE AMBER (A) 06/21/2022 1828   APPEARANCEUR Cloudy (A) 09/03/2022  1441   LABSPEC >1.046 (H) 06/21/2022 1828   PHURINE 6.0 06/21/2022 1828   GLUCOSEU Negative 09/03/2022 1441   HGBUR SMALL (A) 06/21/2022 1828   BILIRUBINUR Negative 09/03/2022 1441   KETONESUR NEGATIVE 06/21/2022 1828   PROTEINUR 3+ (A) 09/03/2022 1441   PROTEINUR 100 (A) 06/21/2022 1828   UROBILINOGEN 4.0 01/02/2017 1524   NITRITE Negative 09/03/2022 1441   NITRITE NEGATIVE 06/21/2022 1828   LEUKOCYTESUR 3+ (A) 09/03/2022 1441   LEUKOCYTESUR LARGE (A) 06/21/2022 1828    Lab Results  Component Value Date   LABMICR See below: 09/03/2022   WBCUA >30 (A) 09/03/2022  LABEPIT 0-10 09/03/2022   BACTERIA Moderate (A) 09/03/2022    Pertinent Imaging: Ct 06/21/2202: Images reviewed and discussed with the patient No results found for this or any previous visit.  No results found for this or any previous visit.  No results found for this or any previous visit.  No results found for this or any previous visit.  No results found for this or any previous visit.  No valid procedures specified. No results found for this or any previous visit.  No results found for this or any previous visit.   Assessment & Plan:    1. Hydronephrosis with urinary obstruction due to renal calculus -We discussed the management of a chronically obstructed kidney including left PCNL, left partial nephrectomy and left simple nephrectomy. After discussing the options the patient elects for robotic left simple nephrectomy. Risks/benefits/alternatives discussed. We will obtain clearance to stop Eliquis 2 days prior to surgery - Urinalysis, Routine w reflex microscopic   No follow-ups on file.  Nicolette Bang, MD  [facility] [place]

## 2022-09-12 ENCOUNTER — Other Ambulatory Visit (HOSPITAL_COMMUNITY): Payer: 59 | Attending: Psychiatry

## 2022-09-12 ENCOUNTER — Ambulatory Visit: Payer: Managed Care, Other (non HMO) | Admitting: Gastroenterology

## 2022-09-12 ENCOUNTER — Telehealth (HOSPITAL_COMMUNITY): Payer: Self-pay | Admitting: Psychiatry

## 2022-09-12 DIAGNOSIS — F332 Major depressive disorder, recurrent severe without psychotic features: Secondary | ICD-10-CM | POA: Diagnosis not present

## 2022-09-12 LAB — URINALYSIS, ROUTINE W REFLEX MICROSCOPIC
Bilirubin, UA: NEGATIVE
Glucose, UA: NEGATIVE
Nitrite, UA: NEGATIVE
Specific Gravity, UA: 1.03 (ref 1.005–1.030)
Urobilinogen, Ur: 1 mg/dL (ref 0.2–1.0)
pH, UA: 6.5 (ref 5.0–7.5)

## 2022-09-12 LAB — MICROSCOPIC EXAMINATION: WBC, UA: >30 /hpf — AB (ref 0–5)

## 2022-09-12 NOTE — Telephone Encounter (Signed)
D:  The interim [place] Coordinator placed courtesy call to check on patient, but there was no answer.  A:  Left coordinator's phone number if pt has any questions/concerns.  Inform [place] team.

## 2022-09-12 NOTE — Progress Notes (Deleted)
Patient reported to [facility] for her 23rd session of Repetitive Transcranial Magnetic Stimulation treatment for severe episode of recurrent major depressive disorder, without psychotic features. Patient presented with sadder affect, level mood today but admitted she learned yesterday she will be having surgery on 09/30/22 to remove her left kidney after learning it was not working from past kidney stone blockage.  Patient reported she never\ remembered dealing with a kidney stone as she stated she has had her gall bladder taken out, bariatric surgery, and other surgeries with pain but never knew she had a kidney stone previously.  Patient stated she was definitely not looking forward to the surgery but understands it needs to be completed. Patient denied any auditory or visual hallucinations, no suicidal or homicidal ideations and no plans, intent, or means to want to harm herself or others. Patient reported no current pain, discomfort, headaches or other issues with ongoing TMS treatments.  Patient continues to report noticed benefits of [place] treatments. No change in alcohol/substance use, caffeine consumption, sleep pattern or metal implant status since previous treatment.  Power was titrated to 120% and patient tolerated session without any complaint of pain or discomfort. Patient stated plan to return 09/13/22 for next scheduled session.

## 2022-09-13 ENCOUNTER — Other Ambulatory Visit (HOSPITAL_COMMUNITY): Payer: 59 | Attending: Psychiatry

## 2022-09-13 ENCOUNTER — Telehealth: Payer: Self-pay

## 2022-09-13 ENCOUNTER — Encounter (HOSPITAL_COMMUNITY): Payer: Self-pay

## 2022-09-13 DIAGNOSIS — F332 Major depressive disorder, recurrent severe without psychotic features: Secondary | ICD-10-CM

## 2022-09-13 NOTE — Progress Notes (Cosign Needed Addendum)
Patient reported to [facility] for her 24th session of Repetitive Transcranial Magnetic Stimulation treatment for severe episode of recurrent major depressive disorder, without psychotic features. Patient presented with flat affect, level mood and denied any suicidal or homicidal ideations, no plans, intent, or reported means to want to harm self or others at this time. No auditory or visual hallucinations and patient scored a 17 on her PHQ2/9 depression screening today, down 1 since 1023/23 earlier this week. Patient reported no real plans for the coming weekend but glad it will be warmer. After some initial discomfort and replacement of TMS magnet, power was titrated to 120% and patient was able to complete treatment with no other reported pain or discomfort. Patient reported no problem with headaches or other issues from [place] treatment and plans to return for next session Monday 09/16/22. Patient reported no change in alcohol/substance use, caffeine consumption, sleep pattern or metal implant status since previous treatment.

## 2022-09-13 NOTE — Progress Notes (Cosign Needed Addendum)
Patient reported to [facility] for her 23rd session of Repetitive Transcranial Magnetic Stimulation treatment for severe episode of recurrent major depressive disorder, without psychotic features. Patient presented with sadder affect, level mood and denied any suicidal or homicidal ideations, no plans, intent, or reported means to want to harm self or others at this time. No auditory or visual hallucinations as patient reported she was down because she had learned she would need to have her left kidney removed and surgery is now scheduled for 09/30/22.  Patient happy she will complete most TMS treatments prior to this surgery but expressed that she does not like having to have any surgeries and was surprised to learn she had kidney damage from a past kidney stone she denies remembering.  Patient reported she has had her gall bladder out, bariatric surgery and others but dreads the 4 weeks and discomfort she is going to have to go through and concern for later only having one kidney even though she states her urologist informed her her left kidney was not working at present. Power for [employer] [facility] treatment was titrated to 120% and patient was able to complete treatment with no reported pain, headaches or other discomfort. Patient reported no change in alcohol/substance use, caffeine consumption, sleep pattern or metal implant status since previous treatment. Patient left session with no other concerns noted and reported plan to return on tomorrow for next scheduled [place] session.

## 2022-09-13 NOTE — Telephone Encounter (Signed)
I spoke with Ms. Brinegar. We have discussed possible surgery dates and 09/30/2022 was agreed upon by all parties. Patient given information about surgery date, what to expect pre-operatively and post operatively.    We discussed that a pre-op nurse will be calling to set up the pre-op visit that will take place prior to surgery. Informed patient that our office will communicate any additional care to be provided after surgery.    Patients questions or concerns were discussed during our call. Advised to call our office should there be any additional information, questions or concerns that arise. Patient verbalized understanding.      Surgical clearance sent to Dr. Melvyn Novas to hold Eliquis 2 days prior.

## 2022-09-16 ENCOUNTER — Encounter (HOSPITAL_COMMUNITY): Payer: Self-pay

## 2022-09-16 ENCOUNTER — Other Ambulatory Visit (HOSPITAL_COMMUNITY): Payer: 59 | Attending: Psychiatry

## 2022-09-16 DIAGNOSIS — F332 Major depressive disorder, recurrent severe without psychotic features: Secondary | ICD-10-CM

## 2022-09-16 NOTE — Progress Notes (Signed)
Patient reported to [facility] for her 25th session of Repetitive Transcranial Magnetic Stimulation treatment for severe episode of recurrent major depressive disorder, without psychotic features. Patient presented with a little brighter affect, level mood today.  Patient denied any suicidal or homicidal ideations, no plans, intent, or reported means to want to harm self or other and no auditory or visual hallucinations or other symptoms at this time. Patient reported her sister has notices some improvement in patient's mood and patient admits noticing she if feeling "some better" too.  Power was titrated to 120% and patient was able to complete treatment with no complaint or discomfort. Patient reported she still gets  headaches at times but reported she gets migraines and not related to [place].  Patient reported she is worrying some about her upcoming surgery to remove her left Kidney in mid November and feels this is affecting her mood some too but does overall, feel [place] is helping and wants to complete treatments prior to upcoming surgery.  Patient reported no change in alcohol/substance use, caffeine consumption, sleep pattern or metal implant status since previous treatment. Patient stable with no other concerns noted upon leaving today with plan to return on tomorrow, 09/17/22 for next scheduled treatment.

## 2022-09-17 ENCOUNTER — Other Ambulatory Visit (HOSPITAL_COMMUNITY): Payer: 59 | Attending: Psychiatry

## 2022-09-17 ENCOUNTER — Encounter: Payer: Self-pay | Admitting: Urology

## 2022-09-17 ENCOUNTER — Encounter (HOSPITAL_COMMUNITY): Payer: Self-pay

## 2022-09-17 DIAGNOSIS — F332 Major depressive disorder, recurrent severe without psychotic features: Secondary | ICD-10-CM
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
## 2022-09-17 NOTE — Patient Instructions (Signed)
Hydronephrosis  Hydronephrosis is the swelling of one or both kidneys due to a blockage that stops urine from flowing out of the body. Kidneys filter waste from the blood and produce urine. This condition can lead to kidney failure and may become life-threatening if not treated promptly. What are the causes? In infants and children, common causes include problems that occur when a baby is developing in the womb. These can include problems in the kidneys or in the tubes that drain urine into the bladder (ureters). In adults, common causes include: Kidney stones. Pregnancy. A tumor or cyst in the abdomen or pelvis. An enlarged prostate gland. Other causes include: Bladder infection. Scar tissue from a previous surgery or injury. A blood clot. Cancer of the prostate, bladder, uterus, ovary, or colon. What are the signs or symptoms? Symptoms of this condition include: Pain or discomfort in your side (flank) or abdomen. Swelling in your abdomen. Nausea and vomiting. Fever. Pain when passing urine. Feelings of urgency when you need to urinate. Urinating more often than normal. In some cases, you may not have any symptoms. How is this diagnosed? This condition may be diagnosed based on: Your symptoms and medical history. A physical exam. Blood and urine tests. Imaging tests, such as an ultrasound, CT scan, or MRI. A procedure to look at your urinary tract and bladder by inserting a scope into the urethra (cystoscopy). How is this treated? Treatment for this condition depends on where the blockage is, how long it has been there, and what caused it. The goal of treatment is to remove the blockage. Treatment may include: Antibiotic medicines to treat or prevent infection. A procedure to place a small, thin tube (stent) into a blocked ureter. The stent will keep the ureter open so that urine can drain through it. A nonsurgical procedure that crushes kidney stones with shock waves  (extracorporeal shock wave lithotripsy). If kidney failure occurs, treatment may include dialysis or a kidney transplant. Follow these instructions at home:  Take over-the-counter and prescription medicines only as told by your health care provider. If you were prescribed an antibiotic medicine, take it exactly as told by your health care provider. Do not stop taking the antibiotic even if you start to feel better. Rest and return to your normal activities as told by your health care provider. Ask your health care provider what activities are safe for you. Drink enough fluid to keep your urine pale yellow. Keep all follow-up visits. This is important. Contact a health care provider if: You continue to have symptoms after treatment. You develop new symptoms. Your urine becomes cloudy or bloody. You have a fever. Get help right away if: You have severe flank or abdominal pain. You cannot drink fluids without vomiting. Summary Hydronephrosis is the swelling of one or both kidneys due to a blockage that stops urine from flowing out of the body. Hydronephrosis can lead to kidney failure and may become life-threatening if not treated promptly. The goal of treatment is to remove the blockage. It may include a procedure to insert a stent into a blocked ureter, a procedure to break up kidney stones, or taking antibiotic medicines. Follow your health care provider's instructions for taking care of yourself at home, including instructions about drinking fluids, taking medicines, and limiting activities. This information is not intended to replace advice given to you by your health care provider. Make sure you discuss any questions you have with your health care provider. Document Revised: 02/22/2020 Document Reviewed: 02/22/2020 Elsevier   Patient Education  2023 Elsevier Inc.  

## 2022-09-18 ENCOUNTER — Encounter (HOSPITAL_COMMUNITY): Payer: Self-pay

## 2022-09-18 ENCOUNTER — Other Ambulatory Visit (HOSPITAL_COMMUNITY): Payer: 59 | Attending: Psychiatry

## 2022-09-18 ENCOUNTER — Telehealth (HOSPITAL_COMMUNITY): Payer: Self-pay | Admitting: Psychiatry

## 2022-09-18 DIAGNOSIS — F332 Major depressive disorder, recurrent severe without psychotic features: Secondary | ICD-10-CM | POA: Diagnosis not present

## 2022-09-18 NOTE — Progress Notes (Signed)
Patient reported to [facility] for her 27th session of Repetitive Transcranial Magnetic Stimulation treatment for severe episode of recurrent major depressive disorder, without psychotic features. Patient presented with appropriate affect, level mood and denied any suicidal or homicidal ideations, no plans, intent, or reported means to want to harm self or other and no auditory or visual hallucinations or other symptoms at this time. Patient did report having a migraine today, as states she gets these from time to time but wanted to still have [place] session.  Discussed how patient manages these currently with medications and then set patient up for treatment episode.  Patient tolerated power titrated to 120% and patient completed treatment with no complaint of pain or other discomfort.  Patient reported no change in alcohol/substance use, caffeine consumption, sleep pattern or metal implant status since previous treatment. Patient reported she would need a letter for her disability continuation for an additional 2 weeks after her final regular session for taper down sessions and agreed to send a message with this request to Dr. Kai Levins and her primary psychiatrist, [name] so they could discuss to assist.  Patient reported more medical records may be needed as well and informed this would be passed on to current [place] coordinator and Debarah Crape, RN who will be returning to do future sessions starting tomorrow.  Patient to follow up wit them for needed items and left with no other concerns and reported no problems from treatment session this date.  Patient to return on tomorrow, 09/19/22 for next scheduled [place] session.

## 2022-09-18 NOTE — Telephone Encounter (Signed)
D:  Patient is requesting that notes be sent to [place].  The letter from the disability company states that notes are needed from 08-21-22 to present.  A:  Interim [place] Coordinator attempted to print off the [place] notes but doesn't have clearance to do so because the notes were marked as sensitive.  Informed Beather Arbour, RN (mgr).  He requested the coordinator to contact IT.  Contacted IT and a ticket had to be put in.  Ticket # [id number]. Placed call to inform pt that the coordinator is awaiting clearance in order to access the notes.  Informed Shawn.  R:  Pt receptive.

## 2022-09-19 ENCOUNTER — Other Ambulatory Visit (HOSPITAL_COMMUNITY): Payer: 59 | Attending: Psychiatry | Admitting: *Deleted

## 2022-09-19 DIAGNOSIS — F332 Major depressive disorder, recurrent severe without psychotic features: Secondary | ICD-10-CM

## 2022-09-19 NOTE — Patient Instructions (Signed)
Patient reported to [facility] for her 28th session of Repetitive Transcranial Magnetic Stimulation treatment for severe episode of recurrent major depressive disorder, without psychotic features. Patient presented with bright affect, denied any suicidal or homicidal ideations.Encouraged her to try to make all remaining appts. Pt reports she's looking forward to having her hair done today. Continues to report that she is feeling the benefits of the treatments.  Pt reported that she will need her left sided kidney removed in the next few weeks and would like to complete her 6 taper treatments after surgery. No change in alcohol/substance use, caffeine consumption, sleep pattern or metal implant status since previous tx.  Power was titrated to 120%. Pt left treatment without complaint.

## 2022-09-20 ENCOUNTER — Other Ambulatory Visit (HOSPITAL_COMMUNITY): Payer: 59 | Attending: Psychiatry | Admitting: *Deleted

## 2022-09-20 DIAGNOSIS — F332 Major depressive disorder, recurrent severe without psychotic features: Secondary | ICD-10-CM | POA: Diagnosis not present

## 2022-09-20 NOTE — Progress Notes (Signed)
Patient reported to [facility] for her 29th session of Repetitive Transcranial Magnetic Stimulation treatment for severe episode of recurrent major depressive disorder, without psychotic features. Patient presented with brighter affect, denied any suicidal or homicidal ideations.Encouraged her to try to make all remaining appts. Pt has a nephrectomy scheduled mid-month and his nervous about the procedure. She'd like to wait to have her taper treatments until after her surgery. Continues to report that she is feeling the benefits of the treatments.  No change in alcohol/substance use, caffeine consumption, sleep pattern or metal implant status since previous tx.  Power was titrated to 120%. Pt's PHQ-9 score today is a 14. Pt left treatment without complaint.

## 2022-09-23 ENCOUNTER — Telehealth (HOSPITAL_COMMUNITY): Payer: Self-pay | Admitting: Psychiatry

## 2022-09-24 ENCOUNTER — Other Ambulatory Visit (HOSPITAL_COMMUNITY): Payer: 59 | Attending: Psychiatry | Admitting: *Deleted

## 2022-09-24 DIAGNOSIS — F332 Major depressive disorder, recurrent severe without psychotic features: Secondary | ICD-10-CM

## 2022-09-24 NOTE — Progress Notes (Signed)
Patient reported to [facility] for her 30th session of Repetitive Transcranial Magnetic Stimulation treatment for severe episode of recurrent major depressive disorder, without psychotic features. Patient presented with brighter affect, denied any suicidal or homicidal ideations. Pt has a nephrectomy scheduled 11/13 and is nervous about the procedure. She'd like to wait to have her taper treatments until after her surgery. Continues to report that she is feeling the benefits of the treatments.  No change in alcohol/substance use, caffeine consumption, sleep pattern or metal implant status since previous tx.  Power was titrated to 120%. Pt left treatment without complaint.

## 2022-09-24 NOTE — Patient Instructions (Signed)
Olivia Woodward  09/24/2022     '@[email or web address]'$ @   Your procedure is scheduled on  09/30/2022.   Report to [facility] at  0600  A.M.   Call this number if you have problems the morning of surgery:  [phone]  If you experience any cold or flu symptoms such as cough, fever, chills, shortness of breath, etc. between now and your scheduled surgery, please notify us at the above number.   Remember:  Do not eat or drink after midnight.         Your last dose of eliquis should be on 09/27/2022.     Take these medicines the morning of surgery with A SIP OF WATER        xanax(if needed), amlodipine, vraylar, lexapro, lamictal, zofran (If needed), protonix, prooranolol, topamac, maxalt (if needed).     Do not wear jewelry, make-up or nail polish.  Do not wear lotions, powders, or perfumes, or deodorant.  Do not shave 48 hours prior to surgery.  Men may shave face and neck.  Do not bring valuables to the hospital.  [facility] is not responsible for any belongings or valuables.  Contacts, dentures or bridgework may not be worn into surgery.  Leave your suitcase in the car.  After surgery it may be brought to your room.  For patients admitted to the hospital, discharge time will be determined by your treatment team.  Patients discharged the day of surgery will not be allowed to drive home and must have someone with them for 24 hours.    Special instructions:   DO NOT smoke tobacco or vape for 24 hours before your procedure.  Please read over the following fact sheets that you were given. Pain Booklet, Coughing and Deep Breathing, Blood Transfusion Information, Lab Information, Surgical Site Infection Prevention, Anesthesia Post-op Instructions, and Care and Recovery After Surgery      Minimally Invasive Nephrectomy, Care After This sheet gives you information about how to care for yourself after your procedure. Your health care provider may also give you  more specific instructions. If you have problems or questions, contact your health care provider. What can I expect after the procedure? After the procedure, it is common to have: Pain. Soreness and numbness in the area around your incisions. Follow these instructions at home: Medicines  Take over-the-counter and prescription medicines only as told by your health care provider. Avoid using NSAIDs regularly over a long period of time. These include aspirin and ibuprofen. Doing so may damage your remaining kidney. Ask your health care provider if the medicine prescribed to you: Requires you to avoid driving or using machinery. Can cause constipation. You may need to take these actions to prevent or treat constipation: Drink enough fluid to keep your urine pale yellow. Take over-the-counter or prescription medicines. Eat foods that are high in fiber, such as beans, whole grains, and fresh fruits and vegetables. Limit foods that are high in fat and processed sugars, such as fried or sweet foods. Incision care and drain care  Follow instructions from your health care provider about how to take care of your incisions. Make sure you: Wash your hands with soap and water for at least 20 seconds before and after you change your bandage (dressing). If soap and water are not available, use hand sanitizer. Change your dressing as told by your health care provider. Leave stitches (sutures), skin glue, or adhesive strips in place. These skin  closures may need to be in place for 2 weeks or longer. If adhesive strip edges start to loosen and curl up, you may trim the loose edges. Do not remove adhesive strips completely unless your health care provider tells you to do that. Check your incision area every day for signs of infection. Check for: Redness, swelling, or more pain. Fluid or blood. Warmth. Pus or a bad smell. If you have a drain in place, follow your health care provider's instructions for drain  care. This may include emptying the drain and keeping track of the amount of drainage. Activity  Rest as told by your health care provider. Avoid sitting for a long time without moving. Get up to take short walks every 1-2 hours. This is important to improve blood flow and breathing. Ask for help if you feel weak or unsteady. Do not lift anything that is heavier than 10 lb (4.5 kg), or the limit that you are told, until your health care provider says that it is safe. Do not play contact sports. Doing so may damage your remaining kidney. Return to your normal activities as told by your health care provider. Ask your health care provider what activities are safe for you. Catheter care If you have a urinary catheter, care for it as told by your health care provider. You may be told to: Wash your hands with soap and water for at least 20 seconds before and after touching the catheter, tubing, or drainage bag. If soap and water are not available, use hand sanitizer. Empty the drainage bag every 2-4 hours, or more often if needed. Do not let the bag get completely full. Monitor the amount and color of your urine as told by your health care provider. Keep the area around the catheter clean and dry. Make sure to keep the catheter secured to avoid pulling and accidental removal. Always keep the urine collection bag below the level of your bladder. Check the catheter tubing regularly to make sure there are no kinks or blockages. Make sure that the catheter is not placed under water. General instructions Do not take baths, swim, or use a hot tub until your health care provider approves. Ask your health care provider if you may take showers. You may only be allowed to take sponge baths. Do deep breathing exercises as told by your health care provider. These help to prevent lung infection (pneumonia). Wear compression stockings as told by your health care provider. These stockings help to prevent blood clots  and reduce swelling in your legs. Keep all follow-up visits. This is important. Contact a health care provider if: You have a fever or chills. You have pain that is not controlled by your pain medicine. You have redness, swelling, or more pain at an incision site. You have a cough. An incision is warm to the touch. You have blood in your urine. You have not had a bowel movement in 3 days. You have diarrhea. Get help right away if: You have trouble breathing or feel short of breath. You have chest pain. You have severe pain. There is fluid or blood coming from an incision. There is pus or a bad smell coming from an incision. You cannot urinate. You have warmth, redness, and tenderness in your leg. These symptoms may represent a serious problem that is an emergency. Do not wait to see if the symptoms will go away. Get medical help right away. Call your local emergency services (911 in the [place] Do not  drive yourself to the hospital. Summary Follow instructions from your health care provider about how to take care of your incisions. Check your incision area every day for signs of infection. Take over-the-counter and prescription medicines only as told by your health care provider. Return to your normal activities as told by your health care provider. Ask your health care provider what activities are safe for you. Keep all follow-up visits. This is important. This information is not intended to replace advice given to you by your health care provider. Make sure you discuss any questions you have with your health care provider. Document Revised: 02/28/2020 Document Reviewed: 02/28/2020 Elsevier Patient Education  [place] Anesthesia, Adult, Care After The following information offers guidance on how to care for yourself after your procedure. Your health care provider may also give you more specific instructions. If you have problems or questions, contact your health care  provider. What can I expect after the procedure? After the procedure, it is common for people to: Have pain or discomfort at the IV site. Have nausea or vomiting. Have a sore throat or hoarseness. Have trouble concentrating. Feel cold or chills. Feel weak, sleepy, or tired (fatigue). Have soreness and body aches. These can affect parts of the body that were not involved in surgery. Follow these instructions at home: For the time period you were told by your health care provider:  Rest. Do not participate in activities where you could fall or become injured. Do not drive or use machinery. Do not drink alcohol. Do not take sleeping pills or medicines that cause drowsiness. Do not make important decisions or sign legal documents. Do not take care of children on your own. General instructions Drink enough fluid to keep your urine pale yellow. If you have sleep apnea, surgery and certain medicines can increase your risk for breathing problems. Follow instructions from your health care provider about wearing your sleep device: Anytime you are sleeping, including during daytime naps. While taking prescription pain medicines, sleeping medicines, or medicines that make you drowsy. Return to your normal activities as told by your health care provider. Ask your health care provider what activities are safe for you. Take over-the-counter and prescription medicines only as told by your health care provider. Do not use any products that contain nicotine or tobacco. These products include cigarettes, chewing tobacco, and vaping devices, such as e-cigarettes. These can delay incision healing after surgery. If you need help quitting, ask your health care provider. Contact a health care provider if: You have nausea or vomiting that does not get better with medicine. You vomit every time you eat or drink. You have pain that does not get better with medicine. You cannot urinate or have bloody urine. You  develop a skin rash. You have a fever. Get help right away if: You have trouble breathing. You have chest pain. You vomit blood. These symptoms may be an emergency. Get help right away. Call 911. Do not wait to see if the symptoms will go away. Do not drive yourself to the hospital. Summary After the procedure, it is common to have a sore throat, hoarseness, nausea, vomiting, or to feel weak, sleepy, or fatigue. For the time period you were told by your health care provider, do not drive or use machinery. Get help right away if you have difficulty breathing, have chest pain, or vomit blood. These symptoms may be an emergency. This information is not intended to replace advice given to you by your  health care provider. Make sure you discuss any questions you have with your health care provider. Document Revised: 02/01/2022 Document Reviewed: 02/01/2022 Elsevier Patient Education  [place]. How to Use Chlorhexidine Before Surgery Chlorhexidine gluconate (CHG) is a germ-killing (antiseptic) solution that is used to clean the skin. It can get rid of the bacteria that normally live on the skin and can keep them away for about 24 hours. To clean your skin with CHG, you may be given: A CHG solution to use in the shower or as part of a sponge bath. A prepackaged cloth that contains CHG. Cleaning your skin with CHG may help lower the risk for infection: While you are staying in the intensive care unit of the hospital. If you have a vascular access, such as a central line, to provide short-term or long-term access to your veins. If you have a catheter to drain urine from your bladder. If you are on a ventilator. A ventilator is a machine that helps you breathe by moving air in and out of your lungs. After surgery. What are the risks? Risks of using CHG include: A skin reaction. Hearing loss, if CHG gets in your ears and you have a perforated eardrum. Eye injury, if CHG gets in your eyes  and is not rinsed out. The CHG product catching fire. Make sure that you avoid smoking and flames after applying CHG to your skin. Do not use CHG: If you have a chlorhexidine allergy or have previously reacted to chlorhexidine. On babies younger than 55 months of age. How to use CHG solution Use CHG only as told by your health care provider, and follow the instructions on the label. Use the full amount of CHG as directed. Usually, this is one bottle. During a shower Follow these steps when using CHG solution during a shower (unless your health care provider gives you different instructions): Start the shower. Use your normal soap and shampoo to wash your face and hair. Turn off the shower or move out of the shower stream. Pour the CHG onto a clean washcloth. Do not use any type of brush or rough-edged sponge. Starting at your neck, lather your body down to your toes. Make sure you follow these instructions: If you will be having surgery, pay special attention to the part of your body where you will be having surgery. Scrub this area for at least 1 minute. Do not use CHG on your head or face. If the solution gets into your ears or eyes, rinse them well with water. Avoid your genital area. Avoid any areas of skin that have broken skin, cuts, or scrapes. Scrub your back and under your arms. Make sure to wash skin folds. Let the lather sit on your skin for 1-2 minutes or as long as told by your health care provider. Thoroughly rinse your entire body in the shower. Make sure that all body creases and crevices are rinsed well. Dry off with a clean towel. Do not put any substances on your body afterward--such as powder, lotion, or perfume--unless you are told to do so by your health care provider. Only use lotions that are recommended by the manufacturer. Put on clean clothes or pajamas. If it is the night before your surgery, sleep in clean sheets.  During a sponge bath Follow these steps when  using CHG solution during a sponge bath (unless your health care provider gives you different instructions): Use your normal soap and shampoo to wash your face and hair.  Pour the CHG onto a clean washcloth. Starting at your neck, lather your body down to your toes. Make sure you follow these instructions: If you will be having surgery, pay special attention to the part of your body where you will be having surgery. Scrub this area for at least 1 minute. Do not use CHG on your head or face. If the solution gets into your ears or eyes, rinse them well with water. Avoid your genital area. Avoid any areas of skin that have broken skin, cuts, or scrapes. Scrub your back and under your arms. Make sure to wash skin folds. Let the lather sit on your skin for 1-2 minutes or as long as told by your health care provider. Using a different clean, wet washcloth, thoroughly rinse your entire body. Make sure that all body creases and crevices are rinsed well. Dry off with a clean towel. Do not put any substances on your body afterward--such as powder, lotion, or perfume--unless you are told to do so by your health care provider. Only use lotions that are recommended by the manufacturer. Put on clean clothes or pajamas. If it is the night before your surgery, sleep in clean sheets. How to use CHG prepackaged cloths Only use CHG cloths as told by your health care provider, and follow the instructions on the label. Use the CHG cloth on clean, dry skin. Do not use the CHG cloth on your head or face unless your health care provider tells you to. When washing with the CHG cloth: Avoid your genital area. Avoid any areas of skin that have broken skin, cuts, or scrapes. Before surgery Follow these steps when using a CHG cloth to clean before surgery (unless your health care provider gives you different instructions): Using the CHG cloth, vigorously scrub the part of your body where you will be having surgery. Scrub  using a back-and-forth motion for 3 minutes. The area on your body should be completely wet with CHG when you are done scrubbing. Do not rinse. Discard the cloth and let the area air-dry. Do not put any substances on the area afterward, such as powder, lotion, or perfume. Put on clean clothes or pajamas. If it is the night before your surgery, sleep in clean sheets.  For general bathing Follow these steps when using CHG cloths for general bathing (unless your health care provider gives you different instructions). Use a separate CHG cloth for each area of your body. Make sure you wash between any folds of skin and between your fingers and toes. Wash your body in the following order, switching to a new cloth after each step: The front of your neck, shoulders, and chest. Both of your arms, under your arms, and your hands. Your stomach and groin area, avoiding the genitals. Your right leg and foot. Your left leg and foot. The back of your neck, your back, and your buttocks. Do not rinse. Discard the cloth and let the area air-dry. Do not put any substances on your body afterward--such as powder, lotion, or perfume--unless you are told to do so by your health care provider. Only use lotions that are recommended by the manufacturer. Put on clean clothes or pajamas. Contact a health care provider if: Your skin gets irritated after scrubbing. You have questions about using your solution or cloth. You swallow any chlorhexidine. Call your local poison control center (1-519 578 0504 in the [place] Get help right away if: Your eyes itch badly, or they become very red or swollen. Your  skin itches badly and is red or swollen. Your hearing changes. You have trouble seeing. You have swelling or tingling in your mouth or throat. You have trouble breathing. These symptoms may represent a serious problem that is an emergency. Do not wait to see if the symptoms will go away. Get medical help right away. Call  your local emergency services (911 in the [place] Do not drive yourself to the hospital. Summary Chlorhexidine gluconate (CHG) is a germ-killing (antiseptic) solution that is used to clean the skin. Cleaning your skin with CHG may help to lower your risk for infection. You may be given CHG to use for bathing. It may be in a bottle or in a prepackaged cloth to use on your skin. Carefully follow your health care provider's instructions and the instructions on the product label. Do not use CHG if you have a chlorhexidine allergy. Contact your health care provider if your skin gets irritated after scrubbing. This information is not intended to replace advice given to you by your health care provider. Make sure you discuss any questions you have with your health care provider. Document Revised: 03/04/2022 Document Reviewed: 01/15/2021 Elsevier Patient Education  [place].

## 2022-09-25 ENCOUNTER — Encounter (HOSPITAL_COMMUNITY): Payer: Self-pay

## 2022-09-25 ENCOUNTER — Telehealth: Payer: Self-pay | Admitting: Family Medicine

## 2022-09-25 ENCOUNTER — Other Ambulatory Visit: Payer: Self-pay | Admitting: Family Medicine

## 2022-09-25 ENCOUNTER — Encounter (HOSPITAL_COMMUNITY)
Admission: RE | Admit: 2022-09-25 | Discharge: 2022-09-25 | Disposition: A | Discharge status: Home / Self Care | Payer: Managed Care, Other (non HMO) | Source: Ambulatory Visit | Attending: Urology | Admitting: Urology

## 2022-09-25 ENCOUNTER — Other Ambulatory Visit: Payer: Self-pay | Admitting: Psychiatry

## 2022-09-25 ENCOUNTER — Other Ambulatory Visit: Payer: Self-pay

## 2022-09-25 VITALS — BP 117/79 | HR 65 | Temp 97.7°F | Resp 18 | Ht 66.0 in | Wt 233.0 lb

## 2022-09-25 DIAGNOSIS — F331 Major depressive disorder, recurrent, moderate: Secondary | ICD-10-CM

## 2022-09-25 DIAGNOSIS — Z79899 Other long term (current) drug therapy: Secondary | ICD-10-CM | POA: Insufficient documentation

## 2022-09-25 DIAGNOSIS — D649 Anemia, unspecified: Secondary | ICD-10-CM | POA: Insufficient documentation

## 2022-09-25 DIAGNOSIS — N289 Disorder of kidney and ureter, unspecified: Secondary | ICD-10-CM | POA: Diagnosis not present

## 2022-09-25 DIAGNOSIS — Z01812 Encounter for preprocedural laboratory examination: Secondary | ICD-10-CM | POA: Insufficient documentation

## 2022-09-25 HISTORY — DX: Gastro-esophageal reflux disease without esophagitis: K21.9

## 2022-09-25 LAB — CBC WITH DIFFERENTIAL/PLATELET
Abs Immature Granulocytes: 0.01 10*3/uL (ref 0.00–0.07)
Basophils Absolute: 0 10*3/uL (ref 0.0–0.1)
Basophils Relative: 0 %
Eosinophils Absolute: 0.1 10*3/uL (ref 0.0–0.5)
Eosinophils Relative: 2 %
HCT: 38.6 % (ref 36.0–46.0)
Hemoglobin: 12.4 g/dL (ref 12.0–15.0)
Immature Granulocytes: 0 %
Lymphocytes Relative: 35 %
Lymphs Abs: 2.1 10*3/uL (ref 0.7–4.0)
MCH: 32.4 pg (ref 26.0–34.0)
MCHC: 32.1 g/dL (ref 30.0–36.0)
MCV: 100.8 fL — ABNORMAL HIGH (ref 80.0–100.0)
Monocytes Absolute: 0.5 10*3/uL (ref 0.1–1.0)
Monocytes Relative: 8 %
Neutro Abs: 3.3 10*3/uL (ref 1.7–7.7)
Neutrophils Relative %: 55 %
Platelets: 286 10*3/uL (ref 150–400)
RBC: 3.83 MIL/uL — ABNORMAL LOW (ref 3.87–5.11)
RDW: 13.8 % (ref 11.5–15.5)
WBC: 6.1 10*3/uL (ref 4.0–10.5)
nRBC: 0 % (ref 0.0–0.2)

## 2022-09-25 LAB — BASIC METABOLIC PANEL
Anion gap: 9 (ref 5–15)
BUN: 25 mg/dL — ABNORMAL HIGH (ref 6–20)
CO2: 22 mmol/L (ref 22–32)
Calcium: 9.1 mg/dL (ref 8.9–10.3)
Chloride: 106 mmol/L (ref 98–111)
Creatinine, Ser: 1.38 mg/dL — ABNORMAL HIGH (ref 0.44–1.00)
GFR, Estimated: 45 mL/min — ABNORMAL LOW (ref >60)
Glucose, Bld: 70 mg/dL (ref 70–99)
Potassium: 4.3 mmol/L (ref 3.5–5.1)
Sodium: 137 mmol/L (ref 135–145)

## 2022-09-25 LAB — TYPE AND SCREEN
ABO/RH(D): A POS
Antibody Screen: NEGATIVE

## 2022-09-25 NOTE — Telephone Encounter (Signed)
Disability form  Copied Noted sleeved

## 2022-09-26 ENCOUNTER — Ambulatory Visit: Payer: Managed Care, Other (non HMO) | Admitting: Family Medicine

## 2022-09-30 ENCOUNTER — Other Ambulatory Visit: Payer: Self-pay

## 2022-09-30 ENCOUNTER — Inpatient Hospital Stay (HOSPITAL_COMMUNITY)
Admission: RE | Admit: 2022-09-30 | Discharge: 2022-10-03 | DRG: 660 | Disposition: A | Discharge status: Home / Self Care | Payer: Managed Care, Other (non HMO) | Attending: Urology | Admitting: Urology

## 2022-09-30 ENCOUNTER — Encounter (HOSPITAL_COMMUNITY): Payer: Self-pay | Admitting: Urology

## 2022-09-30 ENCOUNTER — Inpatient Hospital Stay (HOSPITAL_COMMUNITY): Payer: Managed Care, Other (non HMO) | Admitting: Certified Registered"

## 2022-09-30 ENCOUNTER — Encounter (HOSPITAL_COMMUNITY)
Admission: RE | Disposition: A | Discharge status: Home / Self Care | Payer: Self-pay | Source: Home / Self Care | Attending: Urology

## 2022-09-30 DIAGNOSIS — Q625 Duplication of ureter: Secondary | ICD-10-CM | POA: Diagnosis not present

## 2022-09-30 DIAGNOSIS — I10 Essential (primary) hypertension: Secondary | ICD-10-CM | POA: Diagnosis present

## 2022-09-30 DIAGNOSIS — N1330 Unspecified hydronephrosis: Secondary | ICD-10-CM | POA: Diagnosis present

## 2022-09-30 DIAGNOSIS — N132 Hydronephrosis with renal and ureteral calculous obstruction: Secondary | ICD-10-CM | POA: Diagnosis present

## 2022-09-30 DIAGNOSIS — Z7901 Long term (current) use of anticoagulants: Secondary | ICD-10-CM | POA: Diagnosis not present

## 2022-09-30 DIAGNOSIS — Z888 Allergy status to other drugs, medicaments and biological substances status: Secondary | ICD-10-CM

## 2022-09-30 DIAGNOSIS — Z79899 Other long term (current) drug therapy: Secondary | ICD-10-CM | POA: Diagnosis not present

## 2022-09-30 DIAGNOSIS — N200 Calculus of kidney: Secondary | ICD-10-CM | POA: Diagnosis not present

## 2022-09-30 DIAGNOSIS — K219 Gastro-esophageal reflux disease without esophagitis: Secondary | ICD-10-CM | POA: Diagnosis present

## 2022-09-30 DIAGNOSIS — N159 Renal tubulo-interstitial disease, unspecified: Secondary | ICD-10-CM | POA: Diagnosis not present

## 2022-09-30 DIAGNOSIS — N19 Unspecified kidney failure: Secondary | ICD-10-CM | POA: Diagnosis not present

## 2022-09-30 DIAGNOSIS — F418 Other specified anxiety disorders: Secondary | ICD-10-CM | POA: Diagnosis not present

## 2022-09-30 DIAGNOSIS — F419 Anxiety disorder, unspecified: Secondary | ICD-10-CM | POA: Diagnosis present

## 2022-09-30 DIAGNOSIS — D62 Acute posthemorrhagic anemia: Secondary | ICD-10-CM | POA: Diagnosis not present

## 2022-09-30 DIAGNOSIS — Z9884 Bariatric surgery status: Secondary | ICD-10-CM

## 2022-09-30 DIAGNOSIS — Z8249 Family history of ischemic heart disease and other diseases of the circulatory system: Secondary | ICD-10-CM | POA: Diagnosis not present

## 2022-09-30 DIAGNOSIS — F32A Depression, unspecified: Secondary | ICD-10-CM | POA: Diagnosis present

## 2022-09-30 DIAGNOSIS — Z86711 Personal history of pulmonary embolism: Secondary | ICD-10-CM | POA: Diagnosis not present

## 2022-09-30 DIAGNOSIS — N289 Disorder of kidney and ureter, unspecified: Secondary | ICD-10-CM | POA: Diagnosis not present

## 2022-09-30 DIAGNOSIS — N269 Renal sclerosis, unspecified: Secondary | ICD-10-CM | POA: Diagnosis not present

## 2022-09-30 HISTORY — PX: ROBOT ASSISTED LAPAROSCOPIC NEPHRECTOMY: SHX5140

## 2022-09-30 LAB — CBC
HCT: 35 % — ABNORMAL LOW (ref 36.0–46.0)
Hemoglobin: 11.4 g/dL — ABNORMAL LOW (ref 12.0–15.0)
MCH: 32.9 pg (ref 26.0–34.0)
MCHC: 32.6 g/dL (ref 30.0–36.0)
MCV: 101.2 fL — ABNORMAL HIGH (ref 80.0–100.0)
Platelets: 258 10*3/uL (ref 150–400)
RBC: 3.46 MIL/uL — ABNORMAL LOW (ref 3.87–5.11)
RDW: 14.2 % (ref 11.5–15.5)
WBC: 11.6 10*3/uL — ABNORMAL HIGH (ref 4.0–10.5)
nRBC: 0 % (ref 0.0–0.2)

## 2022-09-30 LAB — BASIC METABOLIC PANEL
Anion gap: 5 (ref 5–15)
BUN: 18 mg/dL (ref 6–20)
CO2: 24 mmol/L (ref 22–32)
Calcium: 8.7 mg/dL — ABNORMAL LOW (ref 8.9–10.3)
Chloride: 107 mmol/L (ref 98–111)
Creatinine, Ser: 1.43 mg/dL — ABNORMAL HIGH (ref 0.44–1.00)
GFR, Estimated: 44 mL/min — ABNORMAL LOW (ref >60)
Glucose, Bld: 146 mg/dL — ABNORMAL HIGH (ref 70–99)
Potassium: 4 mmol/L (ref 3.5–5.1)
Sodium: 136 mmol/L (ref 135–145)

## 2022-09-30 LAB — ABO/RH: ABO/RH(D): A POS

## 2022-09-30 SURGERY — NEPHRECTOMY, RADICAL, ROBOT-ASSISTED, LAPAROSCOPIC, ADULT
Anesthesia: General | Site: Abdomen | Laterality: Left

## 2022-09-30 MED ORDER — LACTATED RINGERS IV SOLN
INTRAVENOUS | Status: DC
  Administered 2022-09-30: 1000 mL via INTRAVENOUS

## 2022-09-30 MED ORDER — TOPIRAMATE 100 MG PO TABS
100.0000 mg | ORAL_TABLET | Freq: Two times a day (BID) | ORAL | Status: DC
  Administered 2022-10-03: 100 mg via ORAL
  Filled 2022-09-30: qty 1

## 2022-09-30 MED ORDER — SENNOSIDES-DOCUSATE SODIUM 8.6-50 MG PO TABS
2.0000 | ORAL_TABLET | Freq: Every day | ORAL | Status: DC
  Administered 2022-09-30 – 2022-10-02 (×3): 2 via ORAL
  Filled 2022-09-30 (×3): qty 2

## 2022-09-30 MED ORDER — SODIUM CHLORIDE (PF) 0.9 % IJ SOLN
INTRAMUSCULAR | Status: AC
  Filled 2022-09-30: qty 20

## 2022-09-30 MED ORDER — PHENYLEPHRINE 80 MCG/ML (10ML) SYRINGE FOR IV PUSH (FOR BLOOD PRESSURE SUPPORT)
PREFILLED_SYRINGE | INTRAVENOUS | Status: DC | PRN
  Administered 2022-09-30: 160 ug via INTRAVENOUS

## 2022-09-30 MED ORDER — CEFAZOLIN SODIUM-DEXTROSE 2-4 GM/100ML-% IV SOLN
2.0000 g | Freq: Three times a day (TID) | INTRAVENOUS | Status: AC
  Administered 2022-09-30 (×2): 2 g via INTRAVENOUS
  Filled 2022-09-30 (×2): qty 100

## 2022-09-30 MED ORDER — ALPRAZOLAM 0.25 MG PO TABS
0.2500 mg | ORAL_TABLET | Freq: Two times a day (BID) | ORAL | Status: DC | PRN
  Administered 2022-10-01: 0.25 mg via ORAL
  Filled 2022-09-30: qty 1

## 2022-09-30 MED ORDER — BUPIVACAINE LIPOSOME 1.3 % IJ SUSP
INTRAMUSCULAR | Status: AC
  Filled 2022-09-30: qty 20

## 2022-09-30 MED ORDER — ONDANSETRON HCL 4 MG/2ML IJ SOLN
INTRAMUSCULAR | Status: DC | PRN
  Administered 2022-09-30: 4 mg via INTRAVENOUS

## 2022-09-30 MED ORDER — DIPHENHYDRAMINE HCL 50 MG/ML IJ SOLN
12.5000 mg | Freq: Four times a day (QID) | INTRAMUSCULAR | Status: DC | PRN
  Administered 2022-09-30: 25 mg via INTRAVENOUS
  Filled 2022-09-30: qty 1

## 2022-09-30 MED ORDER — OXYCODONE HCL 5 MG PO TABS
5.0000 mg | ORAL_TABLET | ORAL | Status: DC | PRN
  Administered 2022-10-01 – 2022-10-03 (×7): 5 mg via ORAL
  Filled 2022-09-30 (×7): qty 1

## 2022-09-30 MED ORDER — MIDAZOLAM HCL 2 MG/2ML IJ SOLN
INTRAMUSCULAR | Status: AC
  Filled 2022-09-30: qty 2

## 2022-09-30 MED ORDER — ONDANSETRON HCL 4 MG/2ML IJ SOLN: 4.0000 mg | Freq: Once | INTRAMUSCULAR | Status: DC | PRN

## 2022-09-30 MED ORDER — STERILE WATER FOR IRRIGATION IR SOLN
Status: DC | PRN
  Administered 2022-09-30: 500 mL
  Administered 2022-09-30: 3000 mL

## 2022-09-30 MED ORDER — SCOPOLAMINE 1 MG/3DAYS TD PT72
1.0000 | MEDICATED_PATCH | TRANSDERMAL | Status: DC
  Administered 2022-09-30: 1.5 mg via TRANSDERMAL

## 2022-09-30 MED ORDER — CEFAZOLIN SODIUM-DEXTROSE 2-4 GM/100ML-% IV SOLN
INTRAVENOUS | Status: AC
  Filled 2022-09-30: qty 100

## 2022-09-30 MED ORDER — FENTANYL CITRATE PF 50 MCG/ML IJ SOSY
25.0000 ug | PREFILLED_SYRINGE | INTRAMUSCULAR | Status: DC | PRN
  Administered 2022-09-30 (×3): 50 ug via INTRAVENOUS
  Filled 2022-09-30 (×3): qty 1

## 2022-09-30 MED ORDER — ORAL CARE MOUTH RINSE: 15.0000 mL | Freq: Once | OROMUCOSAL | Status: DC

## 2022-09-30 MED ORDER — DEXAMETHASONE SODIUM PHOSPHATE 10 MG/ML IJ SOLN
INTRAMUSCULAR | Status: DC | PRN
  Administered 2022-09-30: 10 mg via INTRAVENOUS

## 2022-09-30 MED ORDER — PROPRANOLOL HCL 20 MG PO TABS
20.0000 mg | ORAL_TABLET | Freq: Two times a day (BID) | ORAL | Status: DC
  Administered 2022-09-30 – 2022-10-02 (×5): 20 mg via ORAL
  Filled 2022-09-30 (×6): qty 1

## 2022-09-30 MED ORDER — ZOLPIDEM TARTRATE 5 MG PO TABS: 5.0000 mg | ORAL_TABLET | Freq: Every evening | ORAL | Status: DC | PRN

## 2022-09-30 MED ORDER — HEMOSTATIC AGENTS (NO CHARGE) OPTIME
TOPICAL | Status: DC | PRN
  Administered 2022-09-30: 1 via TOPICAL

## 2022-09-30 MED ORDER — PROPOFOL 10 MG/ML IV BOLUS
INTRAVENOUS | Status: AC
  Filled 2022-09-30: qty 20

## 2022-09-30 MED ORDER — BUPIVACAINE LIPOSOME 1.3 % IJ SUSP
INTRAMUSCULAR | Status: DC | PRN
  Administered 2022-09-30: 20 mL

## 2022-09-30 MED ORDER — ESCITALOPRAM OXALATE 10 MG PO TABS
20.0000 mg | ORAL_TABLET | Freq: Every day | ORAL | Status: DC
  Administered 2022-09-30 – 2022-10-03 (×4): 20 mg via ORAL
  Filled 2022-09-30 (×4): qty 2

## 2022-09-30 MED ORDER — FENTANYL CITRATE (PF) 100 MCG/2ML IJ SOLN
INTRAMUSCULAR | Status: AC
  Filled 2022-09-30: qty 2

## 2022-09-30 MED ORDER — ACETAMINOPHEN 325 MG PO TABS: 650.0000 mg | ORAL_TABLET | ORAL | Status: DC | PRN

## 2022-09-30 MED ORDER — CEFAZOLIN SODIUM-DEXTROSE 2-4 GM/100ML-% IV SOLN
2.0000 g | INTRAVENOUS | Status: AC
  Administered 2022-09-30: 2 g via INTRAVENOUS

## 2022-09-30 MED ORDER — SODIUM CHLORIDE 0.9 % IV SOLN: INTRAVENOUS | Status: DC

## 2022-09-30 MED ORDER — FENTANYL CITRATE (PF) 250 MCG/5ML IJ SOLN
INTRAMUSCULAR | Status: AC
  Filled 2022-09-30: qty 5

## 2022-09-30 MED ORDER — SODIUM CHLORIDE 0.9% FLUSH
INTRAVENOUS | Status: DC | PRN
  Administered 2022-09-30: 20 mL

## 2022-09-30 MED ORDER — ONDANSETRON HCL 4 MG/2ML IJ SOLN: 4.0000 mg | INTRAMUSCULAR | Status: DC | PRN

## 2022-09-30 MED ORDER — ROCURONIUM BROMIDE 10 MG/ML (PF) SYRINGE
PREFILLED_SYRINGE | INTRAVENOUS | Status: DC | PRN
  Administered 2022-09-30: 10 mg via INTRAVENOUS
  Administered 2022-09-30: 40 mg via INTRAVENOUS
  Administered 2022-09-30: 60 mg via INTRAVENOUS

## 2022-09-30 MED ORDER — PROPOFOL 10 MG/ML IV BOLUS
INTRAVENOUS | Status: DC | PRN
  Administered 2022-09-30: 200 mg via INTRAVENOUS

## 2022-09-30 MED ORDER — CHLORHEXIDINE GLUCONATE 0.12 % MT SOLN: 15.0000 mL | Freq: Once | OROMUCOSAL | Status: DC

## 2022-09-30 MED ORDER — PANTOPRAZOLE SODIUM 40 MG PO TBEC
40.0000 mg | DELAYED_RELEASE_TABLET | Freq: Every day | ORAL | Status: DC
  Administered 2022-10-01 – 2022-10-03 (×3): 40 mg via ORAL
  Filled 2022-09-30 (×3): qty 1

## 2022-09-30 MED ORDER — LIDOCAINE 2% (20 MG/ML) 5 ML SYRINGE
INTRAMUSCULAR | Status: DC | PRN
  Administered 2022-09-30: 100 mg via INTRAVENOUS

## 2022-09-30 MED ORDER — CHLORHEXIDINE GLUCONATE CLOTH 2 % EX PADS: 6.0000 | MEDICATED_PAD | Freq: Once | CUTANEOUS | Status: DC

## 2022-09-30 MED ORDER — SUGAMMADEX SODIUM 200 MG/2ML IV SOLN
INTRAVENOUS | Status: DC | PRN
  Administered 2022-09-30: 200 mg via INTRAVENOUS

## 2022-09-30 MED ORDER — HYDROMORPHONE HCL 1 MG/ML IJ SOLN
0.5000 mg | INTRAMUSCULAR | Status: DC | PRN
  Administered 2022-09-30 – 2022-10-02 (×8): 1 mg via INTRAVENOUS
  Filled 2022-09-30 (×8): qty 1

## 2022-09-30 MED ORDER — CARIPRAZINE HCL 1.5 MG PO CAPS
3.0000 mg | ORAL_CAPSULE | Freq: Every day | ORAL | Status: DC
  Administered 2022-09-30 – 2022-10-03 (×4): 3 mg via ORAL
  Filled 2022-09-30: qty 1
  Filled 2022-09-30 (×2): qty 2
  Filled 2022-09-30 (×2): qty 1

## 2022-09-30 MED ORDER — FENTANYL CITRATE (PF) 250 MCG/5ML IJ SOLN
INTRAMUSCULAR | Status: DC | PRN
  Administered 2022-09-30: 50 ug via INTRAVENOUS
  Administered 2022-09-30: 100 ug via INTRAVENOUS
  Administered 2022-09-30 (×4): 50 ug via INTRAVENOUS

## 2022-09-30 MED ORDER — AMLODIPINE BESYLATE 5 MG PO TABS
10.0000 mg | ORAL_TABLET | Freq: Every day | ORAL | Status: DC
  Administered 2022-10-01 – 2022-10-02 (×2): 10 mg via ORAL
  Filled 2022-09-30 (×3): qty 2

## 2022-09-30 MED ORDER — SCOPOLAMINE 1 MG/3DAYS TD PT72
MEDICATED_PATCH | TRANSDERMAL | Status: AC
  Filled 2022-09-30: qty 1

## 2022-09-30 MED ORDER — MIDAZOLAM HCL 5 MG/5ML IJ SOLN
INTRAMUSCULAR | Status: DC | PRN
  Administered 2022-09-30: 2 mg via INTRAVENOUS

## 2022-09-30 MED ORDER — SPIRONOLACTONE 25 MG PO TABS
100.0000 mg | ORAL_TABLET | Freq: Every day | ORAL | Status: DC
  Administered 2022-10-01 – 2022-10-02 (×2): 100 mg via ORAL
  Filled 2022-09-30 (×3): qty 4

## 2022-09-30 MED ORDER — CHLORHEXIDINE GLUCONATE 0.12 % MT SOLN
OROMUCOSAL | Status: AC
  Administered 2022-09-30: 15 mL
  Filled 2022-09-30: qty 15

## 2022-09-30 MED ORDER — LAMOTRIGINE 100 MG PO TABS
100.0000 mg | ORAL_TABLET | Freq: Two times a day (BID) | ORAL | Status: DC
  Administered 2022-09-30 – 2022-10-03 (×6): 100 mg via ORAL
  Filled 2022-09-30 (×6): qty 1

## 2022-09-30 MED ORDER — DIPHENHYDRAMINE HCL 12.5 MG/5ML PO ELIX: 12.5000 mg | ORAL_SOLUTION | Freq: Four times a day (QID) | ORAL | Status: DC | PRN

## 2022-09-30 MED ORDER — LINACLOTIDE 145 MCG PO CAPS
290.0000 ug | ORAL_CAPSULE | Freq: Every day | ORAL | Status: DC
  Administered 2022-10-01 – 2022-10-03 (×3): 290 ug via ORAL
  Filled 2022-09-30 (×3): qty 2

## 2022-09-30 MED ORDER — PHENYLEPHRINE HCL-NACL 20-0.9 MG/250ML-% IV SOLN
INTRAVENOUS | Status: AC
  Filled 2022-09-30: qty 250

## 2022-09-30 SURGICAL SUPPLY — 61 items
ADH SKN CLS APL DERMABOND .7 (GAUZE/BANDAGES/DRESSINGS) ×2
APL PRP STRL LF DISP 70% ISPRP (MISCELLANEOUS) ×1
BAG COUNTER SPONGE SURGICOUNT (BAG) IMPLANT
BAG LAPAROSCOPIC 12 15 PORT 16 (BASKET) IMPLANT
BAG RETRIEVAL 12/15 (BASKET) ×1
BAG SPNG CNTER NS LX DISP (BAG) ×1
CHLORAPREP W/TINT 26 (MISCELLANEOUS) ×2 IMPLANT
CLIP LIGATING HEM O LOK PURPLE (MISCELLANEOUS) ×2 IMPLANT
CLIP LIGATING HEMO LOK XL GOLD (MISCELLANEOUS) ×2 IMPLANT
CLIP LIGATING HEMO O LOK GREEN (MISCELLANEOUS) ×2 IMPLANT
COVER LIGHT HANDLE STERIS (MISCELLANEOUS) ×2 IMPLANT
COVER TIP SHEARS 8 DVNC (MISCELLANEOUS) ×2 IMPLANT
COVER TIP SHEARS 8MM DA VINCI (MISCELLANEOUS) ×1
CUTTER ECHEON FLEX ENDO 45 340 (ENDOMECHANICALS) IMPLANT
CUTTER FLEX LINEAR 45M (STAPLE) ×2 IMPLANT
DERMABOND ADVANCED .7 DNX12 (GAUZE/BANDAGES/DRESSINGS) ×2 IMPLANT
DRAPE ARM DVNC X/XI (DISPOSABLE) ×8 IMPLANT
DRAPE COLUMN DVNC XI (DISPOSABLE) ×2 IMPLANT
DRAPE DA VINCI XI ARM (DISPOSABLE) ×4
DRAPE DA VINCI XI COLUMN (DISPOSABLE) ×1
DRAPE INCISE IOBAN 66X45 STRL (DRAPES) ×2 IMPLANT
ELECT PENCIL ROCKER SW 15FT (MISCELLANEOUS) ×2 IMPLANT
ELECT REM PT RETURN 15FT ADLT (MISCELLANEOUS) ×2 IMPLANT
GLOVE BIO SURGEON STRL SZ 6.5 (GLOVE) ×2 IMPLANT
GLOVE BIO SURGEON STRL SZ8 (GLOVE) ×4 IMPLANT
GLOVE BIOGEL PI IND STRL 8 (GLOVE) ×4 IMPLANT
GOWN STRL REUS W/ TWL XL LVL3 (GOWN DISPOSABLE) IMPLANT
GOWN STRL REUS W/TWL LRG LVL3 (GOWN DISPOSABLE) IMPLANT
GOWN STRL REUS W/TWL XL LVL3 (GOWN DISPOSABLE) ×2
GRASPER SUT TROCAR 14GX15 (MISCELLANEOUS) IMPLANT
HEMOSTAT SURGICEL 4X8 (HEMOSTASIS) IMPLANT
HOLDER FOLEY CATH W/STRAP (MISCELLANEOUS) ×2 IMPLANT
IRRIG SUCT STRYKERFLOW 2 WTIP (MISCELLANEOUS) ×1
IRRIGATION SUCT STRKRFLW 2 WTP (MISCELLANEOUS) ×2 IMPLANT
KIT TURNOVER KIT A (KITS) IMPLANT
NDL INSUFFLATION 14GA 120MM (NEEDLE) ×2 IMPLANT
NEEDLE INSUFFLATION 14GA 120MM (NEEDLE) ×1 IMPLANT
PROTECTOR NERVE ULNAR (MISCELLANEOUS) ×4 IMPLANT
RELOAD 45 VASCULAR/THIN (ENDOMECHANICALS) ×3 IMPLANT
RELOAD STAPLE 45 2.5 WHT GRN (ENDOMECHANICALS) ×2 IMPLANT
RELOAD STAPLE 45 2.6 WHT THIN (STAPLE) IMPLANT
SEAL CANN UNIV 5-8 DVNC XI (MISCELLANEOUS) ×8 IMPLANT
SEAL XI 5MM-8MM UNIVERSAL (MISCELLANEOUS) ×4
SET BASIN LINEN APH (SET/KITS/TRAYS/PACK) ×2 IMPLANT
SET TUBE SMOKE EVAC HIGH FLOW (TUBING) ×2 IMPLANT
SOLUTION ELECTROLUBE (MISCELLANEOUS) ×2 IMPLANT
STAPLE RELOAD 45 WHT (STAPLE) IMPLANT
STAPLE RELOAD 45MM WHITE (STAPLE)
SUT ETHILON 3 0 PS 1 (SUTURE) IMPLANT
SUT MNCRL AB 4-0 PS2 18 (SUTURE) ×4 IMPLANT
SUT PDS AB 0 CTX 60 (SUTURE) ×2 IMPLANT
SUT VIC AB 2-0 SH 27 (SUTURE) ×1
SUT VIC AB 2-0 SH 27X BRD (SUTURE) ×2 IMPLANT
SUT VICRYL 0 27 CT2 27 ABS (SUTURE) IMPLANT
TOWEL OR 17X26 10 PK STRL BLUE (TOWEL DISPOSABLE) ×2 IMPLANT
TOWEL OR NON WOVEN STRL DISP B (DISPOSABLE) ×2 IMPLANT
TRAY FOLEY MTR SLVR 16FR STAT (SET/KITS/TRAYS/PACK) IMPLANT
TRAY LAPAROSCOPIC (CUSTOM PROCEDURE TRAY) ×2 IMPLANT
TROCAR Z-THAD FIOS HNDL 12X100 (TROCAR) IMPLANT
TROCAR Z-THREAD FIOS 5X100MM (TROCAR) ×2 IMPLANT
WATER STERILE IRR 1000ML POUR (IV SOLUTION) ×2 IMPLANT

## 2022-09-30 NOTE — Op Note (Addendum)
Preoperative diagnosis: Left nonfunctioning, chronically infected kidney  Postop diagnosis: Same  Procedure: 1.  Left robot assisted laparoscopic simple nephrectomy 2.  Lysis of adhesions, mild  Attending: Nicolette Bang, MD  Assistant: Aviva Signs, MD  Anesthesia: General  Estimated blood loss: 300 cc  Drains: 16 French Foley catheter  Specimens: Left kidney and partial ureter  Antibiotics: ancef  Findings: duplicated ureters. 4 renal arteries, 1 renal vein. The assistant was utilized for retraction, suction, deploying vascular stapler and deploying endocatch bag  Indications: Patient is a 56 year old with a history of left chronically infected and obstructed left kidney.   After discussing treatment options patient decided to proceed with left robot assisted laparoscopic simple nephrectomy.  Procedure in detail: Prior to procedure consent was obtained. Patient was brought to the operating room and briefing was done sure correct patient, correct procedure, correct site.  General anesthesia was in administered patient was placed in the right lateral decubitus position.  a 87 French catheter was placed. their abdomen and flank was then prepped and draped usual sterile fashion.  A Veress needle was used to obtain pneumoperitoneum.  Once pneumoperitoneum was reestablished to 15 mmHg we then placed a 18 mm camera port lateral to the umbilicus at the latera; edge of rectus.  We then proceeded to place 3 more robotic ports. We then placed an assistant port. We then docked the robot.  We then started this dissection by removing a mild amount of anterior abdominal wall adhesions.  We then dissected along the white line of [place].  We then reflected the colon medially.  We then identified the psoas muscle.  Once this was done we traced it down to the iliac vessels and identified the ureter.  Once we identified the gonadal vein and ureter were then traced this to the renal hilum.  The renal vein and  renal artery were skeletonized.  We did we identified one renal vein and four renal artery.  Using the [employer] power stapler within ligated the renal arteries.  Once this was done we then used a second staple load to ligate the renal vein.  We then used a tissue load to ligate the gonadal vein and the ureter.  Once this was done we then freed the kidney from its lateral and posterior attachments.  We then used a Endo Catch bag to remove the specimen.  Once the specimen was in the Endo Catch bag we then inspected the retroperitoneum and noted no residual bleeding.  We then removed our instruments, undocked the robot, and released the pneumoperitoneum.  We then made a midline incision above the umbilicus to remove the specimen.  Once the specimen was removed we then closed the camera and assistant ports with 0 Vicryl in interrupted fashion.  We then closed the midline incision with looped PDS in a running fashion.  We then closed the overlying skin with 2-0 Vicryl in running fashion.  These skin was then subcuticularly closed with 4-0 Monocryl.  We then placed Dermabond over all the incisions.  This included the procedure which resulted by the patient.  Complications: None  Condition: Stable, x-rayed, transferred to PACU.  Plan: Patient is to be admitted for inpatient stay. The foley catheter will be removed in the morning. They will be started on a clear liquid diet POD#1

## 2022-09-30 NOTE — Anesthesia Preprocedure Evaluation (Signed)
Anesthesia Evaluation  Patient identified by MRN, date of birth, ID band Patient awake    Reviewed: Allergy & Precautions, H&P , NPO status , Patient's Chart, lab work & pertinent test results, reviewed documented beta blocker date and time   Airway Mallampati: II  TM Distance: >3 FB Neck ROM: full    Dental no notable dental hx.    Pulmonary neg pulmonary ROS   Pulmonary exam normal breath sounds clear to auscultation       Cardiovascular Exercise Tolerance: Good hypertension, negative cardio ROS  Rhythm:regular Rate:Normal     Neuro/Psych  Headaches PSYCHIATRIC DISORDERS Anxiety Depression     Neuromuscular disease    GI/Hepatic Neg liver ROS,GERD  Medicated,,  Endo/Other  negative endocrine ROS    Renal/GU negative Renal ROS  negative genitourinary   Musculoskeletal   Abdominal   Peds  Hematology  (+) Blood dyscrasia, anemia   Anesthesia Other Findings   Reproductive/Obstetrics negative OB ROS                             Anesthesia Physical Anesthesia Plan  ASA: 3  Anesthesia Plan: General and General ETT   Post-op Pain Management:    Induction:   PONV Risk Score and Plan: Ondansetron  Airway Management Planned:   Additional Equipment:   Intra-op Plan:   Post-operative Plan:   Informed Consent: I have reviewed the patients History and Physical, chart, labs and discussed the procedure including the risks, benefits and alternatives for the proposed anesthesia with the patient or authorized representative who has indicated his/her understanding and acceptance.     Dental Advisory Given  Plan Discussed with: CRNA  Anesthesia Plan Comments:        Anesthesia Quick Evaluation

## 2022-09-30 NOTE — Anesthesia Procedure Notes (Signed)
Arterial Line Insertion Start/End11/13/2023 7:50 AM, 09/30/2022 8:00 AM Performed by: Myna Bright, CRNA, CRNA  Patient location: OR. Preanesthetic checklist: patient identified, IV checked, site marked, risks and benefits discussed, surgical consent, monitors and equipment checked, pre-op evaluation and timeout performed Patient sedated Right, radial was placed Catheter size: 20 G Hand hygiene performed , maximum sterile barriers used  and Seldinger technique used Allen's test indicative of satisfactory collateral circulation Attempts: 3 (one attempt by J. Megan Salon, CRNA, one attempt by B. Briant Cedar, MD, one successful by V.Micole Delehanty, CRNA) Procedure performed using ultrasound guided technique. Ultrasound Notes:anatomy identified, needle tip was noted to be adjacent to the nerve/plexus identified and no ultrasound evidence of intravascular and/or intraneural injection Following insertion, Biopatch. Post procedure assessment: normal  Post procedure complications: unsuccessful attempts and second provider assisted. Patient tolerated the procedure well with no immediate complications.

## 2022-09-30 NOTE — Progress Notes (Signed)
  Transition of Care (TOC) Screening Note   Patient Details  Name: CALANDRIA MULLINGS Date of Birth: 1966-08-04   Transition of Care [facility]) CM/SW Contact:    Iona Beard, [place] Phone Number: 09/30/2022, 12:36 PM    Transition of Care Department [facility]) has reviewed patient and no TOC needs have been identified at this time. We will continue to monitor patient advancement through interdisciplinary progression rounds. If new patient transition needs arise, please place a TOC consult.

## 2022-09-30 NOTE — Transfer of Care (Signed)
Immediate Anesthesia Transfer of Care Note  Patient: Olivia Woodward  Procedure(s) Performed: XI ROBOTIC ASSISTED LAPAROSCOPIC NEPHRECTOMY (Left: Abdomen)  Patient Location: PACU  Anesthesia Type:General  Level of Consciousness: awake, alert , oriented, and patient cooperative  Airway & Oxygen Therapy: Patient Spontanous Breathing and Patient connected to face mask oxygen  Post-op Assessment: Report given to RN, Post -op Vital signs reviewed and stable, and Patient moving all extremities  Post vital signs: Reviewed and stable  Last Vitals:  Vitals Value Taken Time  BP    Temp    Pulse    Resp    SpO2      Last Pain:  Vitals:   09/30/22 0642  TempSrc: Oral  PainSc: 0-No pain      Patients Stated Pain Goal: 7 (35/43/01 4840)  Complications: No notable events documented.

## 2022-09-30 NOTE — Anesthesia Postprocedure Evaluation (Signed)
Anesthesia Post Note  Patient: [place] Ouch Rampey  Procedure(s) Performed: XI ROBOTIC ASSISTED LAPAROSCOPIC NEPHRECTOMY (Left: Abdomen)  Patient location during evaluation: Phase II Anesthesia Type: General Level of consciousness: awake Pain management: pain level controlled Vital Signs Assessment: post-procedure vital signs reviewed and stable Respiratory status: spontaneous breathing and respiratory function stable Cardiovascular status: blood pressure returned to baseline and stable Postop Assessment: no headache and no apparent nausea or vomiting Anesthetic complications: no Comments: Late entry   No notable events documented.   Last Vitals:  Vitals:   09/30/22 1230 09/30/22 1252  BP: 124/83 128/85  Pulse: 85 90  Resp: 11 20  Temp: 36.6 C 36.6 C  SpO2: 92% 95%    Last Pain:  Vitals:   09/30/22 1327  TempSrc:   PainSc: [place]

## 2022-09-30 NOTE — Interval H&P Note (Signed)
History and Physical Interval Note:  09/30/2022 7:24 AM  Olivia Woodward  has presented today for surgery, with the diagnosis of left nonfunctioning kidney.  The various methods of treatment have been discussed with the patient and family. After consideration of risks, benefits and other options for treatment, the patient has consented to  Procedure(s): XI ROBOTIC ASSISTED LAPAROSCOPIC NEPHRECTOMY (Left) as a surgical intervention.  The patient's history has been reviewed, patient examined, no change in status, stable for surgery.  I have reviewed the patient's chart and labs.  Questions were answered to the patient's satisfaction.     Nicolette Bang

## 2022-09-30 NOTE — Anesthesia Procedure Notes (Signed)
Procedure Name: Intubation Date/Time: 09/30/2022 7:42 AM  Performed by: Myna Bright, CRNAPre-anesthesia Checklist: Patient identified, Suction available, Emergency Drugs available and Patient being monitored Patient Re-evaluated:Patient Re-evaluated prior to induction Oxygen Delivery Method: Circle system utilized Preoxygenation: Pre-oxygenation with 100% oxygen Induction Type: IV induction Ventilation: Mask ventilation without difficulty Laryngoscope Size: Mac and 3 Grade View: Grade I Tube type: Oral Tube size: 7.0 mm Number of attempts: 1 Airway Equipment and Method: Stylet Placement Confirmation: ETT inserted through vocal cords under direct vision, positive ETCO2 and breath sounds checked- equal and bilateral Secured at: 21 cm Tube secured with: Tape Dental Injury: Teeth and Oropharynx as per pre-operative assessment

## 2022-10-01 ENCOUNTER — Telehealth: Payer: Self-pay | Admitting: Family Medicine

## 2022-10-01 LAB — CBC
HCT: 32.9 % — ABNORMAL LOW (ref 36.0–46.0)
Hemoglobin: 10.4 g/dL — ABNORMAL LOW (ref 12.0–15.0)
MCH: 32.6 pg (ref 26.0–34.0)
MCHC: 31.6 g/dL (ref 30.0–36.0)
MCV: 103.1 fL — ABNORMAL HIGH (ref 80.0–100.0)
Platelets: 247 10*3/uL (ref 150–400)
RBC: 3.19 MIL/uL — ABNORMAL LOW (ref 3.87–5.11)
RDW: 14.1 % (ref 11.5–15.5)
WBC: 10.9 10*3/uL — ABNORMAL HIGH (ref 4.0–10.5)
nRBC: 0 % (ref 0.0–0.2)

## 2022-10-01 LAB — BASIC METABOLIC PANEL
Anion gap: 5 (ref 5–15)
BUN: 17 mg/dL (ref 6–20)
CO2: 24 mmol/L (ref 22–32)
Calcium: 8.9 mg/dL (ref 8.9–10.3)
Chloride: 109 mmol/L (ref 98–111)
Creatinine, Ser: 1.55 mg/dL — ABNORMAL HIGH (ref 0.44–1.00)
GFR, Estimated: 40 mL/min — ABNORMAL LOW (ref >60)
Glucose, Bld: 115 mg/dL — ABNORMAL HIGH (ref 70–99)
Potassium: 4.7 mmol/L (ref 3.5–5.1)
Sodium: 138 mmol/L (ref 135–145)

## 2022-10-01 NOTE — Progress Notes (Signed)
Pt foley catheter discontinued, 52m removed from bulb and catheter removed without complication. Pt tolerated well, pt denies any pain at this time. Medications administered, family members at bedside at this time, pt resting in bed eating breakfast. Plan for pt to ambulate today and sit up in recliner chair.

## 2022-10-01 NOTE — Progress Notes (Signed)
Pt complaining of shortness of breath and right shoulder pain. Given as needed pain meds. Placed on 2 liters [place] and oxygen saturation went up to 95% upon rechecking. Turned up to 3L/[place] this morning and sats 98%. Still complaining of SOB. Informed MD. Report given to oncoming nurse.

## 2022-10-01 NOTE — Progress Notes (Signed)
Was called into pt's room by family member d/t pt c/o SOB. Pt states that she has felt this way most of the night. She states she has right shoulder pain and feels that she has to take long deep breaths. Pt has hx of blood clots and is concerned she may have one. Lung sounds are clear. MD is aware. Obtaining EKG. Will continue to monitor pt.

## 2022-10-01 NOTE — Progress Notes (Signed)
1 Day Post-Op Subjective: Patient reports mild abdominal pain. Negative flatus. Patient tolerating clear liquids.   Objective: Vital signs in last 24 hours: Temp:  [97.8 F (36.6 C)-98.7 F (37.1 C)] 98.4 F (36.9 C) (11/14 1231) Pulse Rate:  [62-93] 72 (11/14 1231) Resp:  [18] 18 (11/14 1231) BP: (92-107)/(64-73) 107/73 (11/14 1231) SpO2:  [86 %-98 %] 89 % (11/14 1231)  Intake/Output from previous day: 11/13 0701 - 11/14 0700 In: 3381 [P.O.:420; I.V.:2661; IV Piggyback:300] Out: 1025 [Urine:725; Blood:300] Intake/Output this shift: Total I/O In: 1040.4 [P.O.:720; I.V.:320.4] Out: 800 [Urine:800]  Physical Exam:  General:alert, cooperative, and appears stated age GI: soft, non tender, normal bowel sounds, no palpable masses, no organomegaly, no inguinal hernia Female genitalia: not done Extremities: extremities normal, atraumatic, no cyanosis or edema  Lab Results: Recent Labs    09/30/22 1118 10/01/22 0451  HGB 11.4* 10.4*  HCT 35.0* 32.9*   BMET Recent Labs    09/30/22 1118 10/01/22 0451  NA 136 138  K 4.0 4.7  CL 107 109  CO2 24 24  GLUCOSE 146* 115*  BUN 18 17  CREATININE 1.43* 1.55*  CALCIUM 8.7* 8.9   No results for input(s): "LABPT", "INR" in the last 72 hours. No results for input(s): "LABURIN" in the last 72 hours. Results for orders placed or performed in visit on 09/11/22  Microscopic Examination     Status: Abnormal   Collection Time: 09/11/22  3:34 PM   Urine  Result Value Ref Range Status   WBC, UA >30 (A) 0 - 5 /hpf Final   RBC, Urine 11-30 (A) 0 - 2 /hpf Final   Epithelial Cells (non renal) 0-10 0 - 10 /hpf Final   Crystals Present (A) N/A Final   Crystal Type Amorphous Sediment N/A Final   Bacteria, UA Moderate (A) None seen/Few Final    Studies/Results: No results found.  Assessment/Plan: POD#1 left simple nephrectomy D/c Foley Continue clear liquid diet Ambulate in halls with asistance   LOS: 1 day   Nicolette Bang 10/01/2022, 6:53 PM

## 2022-10-01 NOTE — Telephone Encounter (Signed)
Disability forms  Copied Noted sleeved

## 2022-10-02 ENCOUNTER — Inpatient Hospital Stay (HOSPITAL_COMMUNITY): Payer: Managed Care, Other (non HMO)

## 2022-10-02 LAB — BASIC METABOLIC PANEL
Anion gap: 8 (ref 5–15)
BUN: 20 mg/dL (ref 6–20)
CO2: 20 mmol/L — ABNORMAL LOW (ref 22–32)
Calcium: 8.5 mg/dL — ABNORMAL LOW (ref 8.9–10.3)
Chloride: 111 mmol/L (ref 98–111)
Creatinine, Ser: 1.46 mg/dL — ABNORMAL HIGH (ref 0.44–1.00)
GFR, Estimated: 43 mL/min — ABNORMAL LOW (ref >60)
Glucose, Bld: 88 mg/dL (ref 70–99)
Potassium: 4.5 mmol/L (ref 3.5–5.1)
Sodium: 139 mmol/L (ref 135–145)

## 2022-10-02 LAB — CBC
HCT: 35.7 % — ABNORMAL LOW (ref 36.0–46.0)
Hemoglobin: 10.7 g/dL — ABNORMAL LOW (ref 12.0–15.0)
MCH: 33 pg (ref 26.0–34.0)
MCHC: 30 g/dL (ref 30.0–36.0)
MCV: 110.2 fL — ABNORMAL HIGH (ref 80.0–100.0)
Platelets: 200 10*3/uL (ref 150–400)
RBC: 3.24 MIL/uL — ABNORMAL LOW (ref 3.87–5.11)
RDW: 14.5 % (ref 11.5–15.5)
WBC: 10.2 10*3/uL (ref 4.0–10.5)
nRBC: 0 % (ref 0.0–0.2)

## 2022-10-02 MED ORDER — SODIUM CHLORIDE 0.9% FLUSH: 3.0000 mL | Freq: Two times a day (BID) | INTRAVENOUS | Status: DC

## 2022-10-02 MED ORDER — SODIUM CHLORIDE 0.9 % IV SOLN: 250.0000 mL | INTRAVENOUS | Status: DC | PRN

## 2022-10-02 MED ORDER — SODIUM CHLORIDE 0.9% FLUSH: 3.0000 mL | INTRAVENOUS | Status: DC | PRN

## 2022-10-02 NOTE — Progress Notes (Signed)
2 Days Post-Op Subjective: Patient reports mild abdominal pain. Positive flatus. Patient tolerating clear liquids. Oxygen Saturday drops with ambulating   Objective: Vital signs in last 24 hours: Temp:  [97.8 F (36.6 C)-98.8 F (37.1 C)] 97.8 F (36.6 C) (11/15 0901) Pulse Rate:  [67-92] 79 (11/15 0901) Resp:  [16-20] 20 (11/15 0901) BP: (113-122)/(72-80) 122/80 (11/15 0901) SpO2:  [93 %-100 %] 95 % (11/15 0901)  Intake/Output from previous day: 11/14 0701 - 11/15 0700 In: 1140.4 [P.O.:820; I.V.:320.4] Out: 1050 [Urine:1050] Intake/Output this shift: No intake/output data recorded.  Physical Exam:  General:alert, cooperative, and appears stated age GI: soft, non tender, normal bowel sounds, no palpable masses, no organomegaly, no inguinal hernia Female genitalia: not done Extremities: extremities normal, atraumatic, no cyanosis or edema  Lab Results: Recent Labs    09/30/22 1118 10/01/22 0451  HGB 11.4* 10.4*  HCT 35.0* 32.9*   BMET Recent Labs    10/01/22 0451 10/02/22 0850  NA 138 139  K 4.7 4.5  CL 109 111  CO2 24 20*  GLUCOSE 115* 88  BUN 17 20  CREATININE 1.55* 1.46*  CALCIUM 8.9 8.5*   No results for input(s): "LABPT", "INR" in the last 72 hours. No results for input(s): "LABURIN" in the last 72 hours. Results for orders placed or performed in visit on 09/11/22  Microscopic Examination     Status: Abnormal   Collection Time: 09/11/22  3:34 PM   Urine  Result Value Ref Range Status   WBC, UA >30 (A) 0 - 5 /hpf Final   RBC, Urine 11-30 (A) 0 - 2 /hpf Final   Epithelial Cells (non renal) 0-10 0 - 10 /hpf Final   Crystals Present (A) N/A Final   Crystal Type Amorphous Sediment N/A Final   Bacteria, UA Moderate (A) None seen/Few Final    Studies/Results: No results found.  Assessment/Plan: POD#2 left simple nephrectomy Advance diet to regular diet Ambulate in halls with assistance Aggressive pulmonary toilet   LOS: 2 days   Nicolette Bang 10/02/2022, 1:08 PM

## 2022-10-02 NOTE — Progress Notes (Signed)
SATURATION QUALIFICATIONS: (This note is used to comply with regulatory documentation for home oxygen)  Patient Saturations on Room Air at Rest = 98%  Patient Saturations on Room Air while Ambulating = 81%  Patient returned to 93% on room air after ambulation within 3 min.

## 2022-10-02 NOTE — Progress Notes (Signed)
Patient has had mild discomfort this shift.  At beginning of shift, patient c/o of IV burning. Patient states it had been burning for a while.  Checked iv state, iv patent and no swelling to left hand.  Offered to restart IV and patient is a difficult stick.  Patient refused for staff to restart IV on right side, stating normally IVs are started on her left side.  Two 300 staff members ( myself and charge) did not find suitable veins for iv access on left side.  IV ultrasound sought ought at night, but not able to be done tonight.  IV ran fine through night.  Patient also had difficulty with oxygen saturations.  Patient showed no signs of respiratory distress, but oxygen saturations were in the 80s on room air. Patient placed on 2 liters and oxygen saturations were in the 90s.  Attempted to wean patient, but was unable to at this time.  Family member at bedside and voiced concern over respiratory status and requested incentive spirometry.  Given to patient per family request and patient has worked with it all night, her highest value at approximately 700-800. Patient currently on 2 liters of oxygen.  Patient also requests her diet be changed.

## 2022-10-03 LAB — SURGICAL PATHOLOGY

## 2022-10-03 MED ORDER — SIMETHICONE 80 MG PO CHEW: 80.0000 mg | CHEWABLE_TABLET | Freq: Four times a day (QID) | ORAL | 2 refills | Status: DC | PRN

## 2022-10-03 MED ORDER — OXYCODONE HCL 5 MG PO TABS: 5.0000 mg | ORAL_TABLET | ORAL | 0 refills | Status: DC | PRN

## 2022-10-03 MED ORDER — ONDANSETRON HCL 4 MG PO TABS: 4.0000 mg | ORAL_TABLET | Freq: Every day | ORAL | 1 refills | Status: DC | PRN

## 2022-10-03 MED ORDER — SIMETHICONE 80 MG PO CHEW: 80.0000 mg | CHEWABLE_TABLET | Freq: Four times a day (QID) | ORAL | Status: DC | PRN

## 2022-10-03 NOTE — Progress Notes (Signed)
Patient has had a good night. Patient has rested well, urianted and oxygenation been the 90s all night.  Family has been at beside. No new complaints.

## 2022-10-03 NOTE — Progress Notes (Signed)
Patient stable and ready for discharge home. Patient's sister at bedside. Patient had no IV access. Writer went over discharge paperwork with patient and she verbalized understanding. Patient's sister dressed and packed patient's belongings. Patient transported via [facility] to car for discharge by NT.

## 2022-10-03 NOTE — Discharge Summary (Signed)
Physician Discharge Summary  Patient ID: Olivia Woodward MRN: [id number] DOB/AGE: Sep 02, 1966 56 y.o.  Admit date: 09/30/2022 Discharge date: 10/03/2022  Admission Diagnoses:  Hydronephrosis  Discharge Diagnoses:  Principal Problem:   Hydronephrosis Active Problems:   Nonfunctioning kidney   Past Medical History:  Diagnosis Date   Anxiety    Phreesia 03/18/2021   Depression    Phreesia 03/18/2021   Generalized headaches    GERD (gastroesophageal reflux disease)    Hypertension     Surgeries: Procedure(s): XI ROBOTIC ASSISTED LAPAROSCOPIC NEPHRECTOMY on 09/30/2022   Consultants (if any):   Discharged Condition: Improved  Hospital Course: Olivia Woodward is an 56 y.o. female who was admitted 09/30/2022 with a diagnosis of Hydronephrosis and went to the operating room on 09/30/2022 and underwent the above named procedures.    She was given perioperative antibiotics:  Anti-infectives (From admission, onward)    Start     Dose/Rate Route Frequency Ordered Stop   09/30/22 1600  ceFAZolin (ANCEF) IVPB 2g/100 mL premix        2 g 200 mL/hr over 30 Minutes Intravenous Every 8 hours 09/30/22 1246 10/01/22 0015   09/30/22 0635  ceFAZolin (ANCEF) 2-4 GM/100ML-% IVPB       Note to Pharmacy: Sofie Rower R: cabinet override      09/30/22 0635 09/30/22 0833   09/30/22 0633  ceFAZolin (ANCEF) IVPB 2g/100 mL premix        2 g 200 mL/hr over 30 Minutes Intravenous 30 min pre-op 09/30/22 0633 09/30/22 0813     .  She was given sequential compression devices, early ambulation, for DVT prophylaxis.  She benefited maximally from the hospital stay and there were no complications.    Recent vital signs:  Vitals:   10/02/22 2000 10/03/22 0400  BP: 130/84 101/67  Pulse: 72 66  Resp: 18 20  Temp: 99 F (37.2 C) 98.4 F (36.9 C)  SpO2: 92% 100%    Recent laboratory studies:  Lab Results  Component Value Date   HGB 10.7 (L) 10/02/2022   HGB 10.4 (L) 10/01/2022   HGB  11.4 (L) 09/30/2022   Lab Results  Component Value Date   WBC 10.2 10/02/2022   PLT 200 10/02/2022   No results found for: "INR" Lab Results  Component Value Date   NA 139 10/02/2022   K 4.5 10/02/2022   CL 111 10/02/2022   CO2 20 (L) 10/02/2022   BUN 20 10/02/2022   CREATININE 1.46 (H) 10/02/2022   GLUCOSE 88 10/02/2022    Discharge Medications:   Allergies as of 10/03/2022       Reactions   Ketorolac Tromethamine Hives   Lips swelled        Medication List     STOP taking these medications    apixaban 5 MG Tabs tablet Commonly known as: Eliquis       TAKE these medications    ALPRAZolam 0.25 MG tablet Commonly known as: XANAX Take 1 tablet (0.25 mg total) by mouth 2 (two) times daily as needed for up to 7 days. for anxiety   amLODipine 10 MG tablet Commonly known as: NORVASC Take 1 tablet (10 mg total) by mouth daily.   cariprazine 3 MG capsule Commonly known as: Vraylar Take 1 capsule (3 mg total) by mouth daily.   cyclobenzaprine 10 MG tablet Commonly known as: FLEXERIL Take one tablet by mouth twice daily for 5 days , then as needed, for neck spasm   ergocalciferol 1.25 MG (  50000 UT) capsule Commonly known as: VITAMIN D2 Take 1 capsule (50,000 Units total) by mouth once a week. One capsule once weekly   escitalopram 20 MG tablet Commonly known as: Lexapro Take 1 tablet (20 mg total) by mouth daily.   Eszopiclone 3 MG Tabs Take 1 tablet (3 mg total) by mouth at bedtime.   hydrocortisone 2.5 % rectal cream Commonly known as: ANUSOL-HC Place 1 Application rectally 2 (two) times daily.   lamoTRIgine 25 MG tablet Commonly known as: LaMICtal Take 4 tablets (100 mg total) by mouth 2 (two) times daily. Week 1 25 mg bid po, Week 2- 25 mg in AM and 50 mg in PM, Week 3- 50 mg bid po, Week 4- 75 mg bid po, Week 5 100 mg bid po   linaclotide 290 MCG Caps capsule Commonly known as: Linzess Take 1 capsule (290 mcg total) by mouth daily before  breakfast.   ondansetron 4 MG tablet Commonly known as: Zofran Take one tablet by mouth once daily, as needed, for nausea What changed: Another medication with the same name was added. Make sure you understand how and when to take each.   ondansetron 4 MG tablet Commonly known as: Zofran Take 1 tablet (4 mg total) by mouth daily as needed for nausea or vomiting. What changed: You were already taking a medication with the same name, and this prescription was added. Make sure you understand how and when to take each.   oxyCODONE 5 MG immediate release tablet Commonly known as: Oxy IR/ROXICODONE Take 1 tablet (5 mg total) by mouth every 4 (four) hours as needed for moderate pain.   pantoprazole 40 MG tablet Commonly known as: PROTONIX Take 1 tablet (40 mg total) by mouth daily before breakfast.   propranolol 20 MG tablet Commonly known as: INDERAL Take 1 tablet (20 mg total) by mouth 2 (two) times daily.   rizatriptan 10 MG disintegrating tablet Commonly known as: Maxalt-MLT Take 1 tablet (10 mg total) by mouth as needed for migraine. May repeat in 2 hours if needed   simethicone 80 MG chewable tablet Commonly known as: Gas-X Chew 1 tablet (80 mg total) by mouth 4 (four) times daily as needed for flatulence.   spironolactone 100 MG tablet Commonly known as: ALDACTONE Take 1 tablet by mouth once daily   sulfamethoxazole-trimethoprim 800-160 MG tablet Commonly known as: BACTRIM DS Take 1 tablet by mouth 2 (two) times daily.   topiramate 100 MG tablet Commonly known as: TOPAMAX Take 100 mg by mouth 2 (two) times daily.   Vitamin B12 1000 MCG Tbcr Take 2 tablets by mouth once daily        Diagnostic Studies: DG Chest Port 1 View  Result Date: 10/02/2022 CLINICAL DATA:  Shortness of breath, 2 days postop EXAM: PORTABLE CHEST 1 VIEW COMPARISON:  Chest x-ray dated May 30, 2022 FINDINGS: Visualized cardiac and mediastinal contours are within normal limits. low lung volumes  with bibasilar atelectasis. No evidence of pneumothorax. Pneumoperitoneum. IMPRESSION: 1. Low lung volumes with bibasilar atelectasis. 2. Pneumoperitoneum, likely postoperative. Electronically Signed   By: Yetta Glassman M.D.   On: 10/02/2022 14:17    Disposition: Discharge disposition: 01-Home or Self Care       Discharge Instructions     Discharge patient   Complete by: As directed    Discharge disposition: 01-Home or Self Care   Discharge patient date: 10/03/2022        Follow-up Information     Hilja Kintzel, Candee Furbish, MD.  Call in 1 week(s).   Specialty: Urology Contact information: [address]  [place] [address] [phone]                  Signed: Nicolette Bang 10/03/2022, 9:06 AM

## 2022-10-04 ENCOUNTER — Telehealth: Payer: Self-pay

## 2022-10-04 ENCOUNTER — Encounter (HOSPITAL_COMMUNITY): Payer: Self-pay | Admitting: Hematology

## 2022-10-04 NOTE — Telephone Encounter (Signed)
Transition Care Management Unsuccessful Follow-up Telephone Call  Date of discharge and from where:  Olivia Woodward 10/03/2022  Attempts:  1st Attempt  Reason for unsuccessful TCM follow-up call:  Left voice message Juanda Crumble, [place] Direct Dial [phone]

## 2022-10-07 ENCOUNTER — Ambulatory Visit: Payer: Managed Care, Other (non HMO) | Admitting: Physician Assistant

## 2022-10-07 ENCOUNTER — Ambulatory Visit (INDEPENDENT_AMBULATORY_CARE_PROVIDER_SITE_OTHER): Payer: Managed Care, Other (non HMO) | Admitting: Physician Assistant

## 2022-10-07 ENCOUNTER — Encounter (HOSPITAL_COMMUNITY): Payer: Self-pay | Admitting: Urology

## 2022-10-07 VITALS — BP 109/72 | HR 57 | Wt 239.6 lb

## 2022-10-07 DIAGNOSIS — R109 Unspecified abdominal pain: Secondary | ICD-10-CM

## 2022-10-07 DIAGNOSIS — N132 Hydronephrosis with renal and ureteral calculous obstruction: Secondary | ICD-10-CM

## 2022-10-07 LAB — URINALYSIS, ROUTINE W REFLEX MICROSCOPIC
Bilirubin, UA: NEGATIVE
Glucose, UA: NEGATIVE
Leukocytes,UA: NEGATIVE
Nitrite, UA: NEGATIVE
RBC, UA: NEGATIVE
Specific Gravity, UA: 1.03 (ref 1.005–1.030)
Urobilinogen, Ur: >8 mg/dL — ABNORMAL HIGH (ref 0.2–1.0)
pH, UA: 5.5 (ref 5.0–7.5)

## 2022-10-07 LAB — MICROSCOPIC EXAMINATION

## 2022-10-07 MED ORDER — HYDROCODONE-ACETAMINOPHEN 5-325 MG PO TABS: 1.0000 | ORAL_TABLET | ORAL | 0 refills | Status: DC | PRN

## 2022-10-07 NOTE — Telephone Encounter (Signed)
Transition Care Management Unsuccessful Follow-up Telephone Call  Date of discharge and from where:  10/03/2022  Attempts:  2nd Attempt  Reason for unsuccessful TCM follow-up call:  Left voice message. Juanda Crumble, [place] Nurse Health Advisor Direct Dial [phone]

## 2022-10-07 NOTE — Patient Instructions (Signed)
Senokot-S over the counter as needed for constipation.

## 2022-10-07 NOTE — Progress Notes (Unsigned)
Assessment: 1. Hydronephrosis with urinary obstruction due to renal calculus - Urinalysis, Routine w reflex microscopic    Plan: Rx for hydrocodone given and discussed taking less frequently. FU in 4-6 weeks before release to work.   Chief Complaint: No chief complaint on file.   HPI: Olivia Woodward is a 56 y.o. female who presents for post op evaluation of left-sided robotic assisted laparoscopic nephrectomy performed on 09/30/2022. She is one week post op and doing very well. Requests pain meds less strong than oxycodone. No gross hematuria, fever, chills. No issues with constipation on oxycodone.  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 No carcinoma   UA= clear except a few hyaline casts  Portions of the above documentation were copied from a prior visit for review purposes only.  Allergies: Allergies  Allergen Reactions   Ketorolac Tromethamine Hives    Lips swelled    PMH: Past Medical History:  Diagnosis Date   Anxiety    Phreesia 03/18/2021   Depression    Phreesia 03/18/2021   Generalized headaches    GERD (gastroesophageal reflux disease)    Hypertension     PSH: Past Surgical History:  Procedure Laterality Date   ABDOMINAL HYSTERECTOMY     fibroids, partial   BREAST REDUCTION SURGERY  2005   CHOLECYSTECTOMY N/A 05/27/2022   Procedure: LAPAROSCOPIC CHOLECYSTECTOMY;  Surgeon: Aviva Signs, MD;  Location: [facility] ORS;  Service: General;  Laterality: N/A;   COLONOSCOPY WITH PROPOFOL N/A 05/10/2019   Two 5 to 6 mm polyps in the sigmoid colon and in the ascending colon, removed with a   GASTRIC BYPASS  2007   POLYPECTOMY  05/10/2019   Procedure: POLYPECTOMY;  Surgeon: Daneil Dolin, MD;  Location: [facility] ENDO SUITE;  Service: Endoscopy;;  colon    SH: Social History   Tobacco Use   Smoking status: Never   Smokeless tobacco: Never  Vaping Use   Vaping Use: Never used  Substance Use Topics   Alcohol use: No   Drug use:  No    ROS: All other review of systems were reviewed and are negative except what is noted above in HPI  PE: BP 109/72   Pulse (!) 57   Wt 239 lb 9.6 oz (108.7 kg)   BMI 38.67 kg/m  GENERAL APPEARANCE:  Well appearing, well developed, NAD HEENT:  Atraumatic, normocephalic NECK:  Supple. Trachea midline ABDOMEN:  Soft, non-tender, no masses.  Incision sites healing nicely without swelling, tenderness, drainage.  No erythema EXTREMITIES:  Moves all extremities well NEUROLOGIC:  Alert and oriented x 3 MENTAL STATUS:  appropriate SKIN:  Warm, dry, and intact   Results: Laboratory Data: Lab Results  Component Value Date   WBC 10.2 10/02/2022   HGB 10.7 (L) 10/02/2022   HCT 35.7 (L) 10/02/2022   MCV 110.2 (H) 10/02/2022   PLT 200 10/02/2022    Lab Results  Component Value Date   CREATININE 1.46 (H) 10/02/2022    Lab Results  Component Value Date   HGBA1C 5.4 03/22/2021    Urinalysis    Component Value Date/Time   COLORURINE AMBER (A) 06/21/2022 1828   APPEARANCEUR Cloudy (A) 09/11/2022 1534   LABSPEC >1.046 (H) 06/21/2022 1828   PHURINE 6.0 06/21/2022 1828   GLUCOSEU Negative 09/11/2022 1534   HGBUR SMALL (A) 06/21/2022 1828   BILIRUBINUR Negative 09/11/2022 1534   KETONESUR NEGATIVE 06/21/2022 1828   PROTEINUR 2+ (A) 09/11/2022 1534   PROTEINUR 100 (A) 06/21/2022 1828   UROBILINOGEN 4.0  01/02/2017 1524   NITRITE Negative 09/11/2022 1534   NITRITE NEGATIVE 06/21/2022 1828   LEUKOCYTESUR 3+ (A) 09/11/2022 1534   LEUKOCYTESUR LARGE (A) 06/21/2022 1828    Lab Results  Component Value Date   LABMICR See below: 09/11/2022   WBCUA >30 (A) 09/11/2022   LABEPIT 0-10 09/11/2022   BACTERIA Moderate (A) 09/11/2022    Pertinent Imaging: No results found for this or any previous visit.  No results found for this or any previous visit.  No results found for this or any previous visit.  No results found for this or any previous visit.  No results found for  this or any previous visit.  No valid procedures specified. No results found for this or any previous visit.  No results found for this or any previous visit.  No results found for this or any previous visit (from the past 24 hour(s)).

## 2022-10-08 ENCOUNTER — Ambulatory Visit: Payer: Managed Care, Other (non HMO) | Admitting: "Endocrinology

## 2022-10-08 ENCOUNTER — Telehealth: Payer: Self-pay

## 2022-10-08 NOTE — Telephone Encounter (Signed)
Transition Care Management Unsuccessful Follow-up Telephone Call  Date of discharge and from where:  [place] 10/03/2022  Attempts:  3rd Attempt  Reason for unsuccessful TCM follow-up call:  Left voice message Juanda Crumble, [place] Direct Dial [phone]

## 2022-10-08 NOTE — Telephone Encounter (Signed)
Patient left a message stating  someone was to f/u with her today concerning her urine specimen from 10/07/22. Patient states she saw her results on my chart and is concerned she may have an infection. Please advise.
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
## 2022-10-09 ENCOUNTER — Encounter: Payer: Self-pay | Admitting: Psychiatry

## 2022-10-09 ENCOUNTER — Telehealth (INDEPENDENT_AMBULATORY_CARE_PROVIDER_SITE_OTHER): Payer: 59 | Admitting: Psychiatry

## 2022-10-09 DIAGNOSIS — G4700 Insomnia, unspecified: Secondary | ICD-10-CM | POA: Diagnosis not present

## 2022-10-09 DIAGNOSIS — F330 Major depressive disorder, recurrent, mild: Secondary | ICD-10-CM

## 2022-10-09 DIAGNOSIS — F419 Anxiety disorder, unspecified: Secondary | ICD-10-CM

## 2022-10-09 MED ORDER — ALPRAZOLAM 0.25 MG PO TABS: 0.2500 mg | ORAL_TABLET | Freq: Two times a day (BID) | ORAL | 0 refills | Status: DC | PRN

## 2022-10-09 MED ORDER — ESZOPICLONE 3 MG PO TABS: 3.0000 mg | ORAL_TABLET | Freq: Every day | ORAL | 0 refills | Status: DC

## 2022-10-09 NOTE — Telephone Encounter (Signed)
Patient aware of response to ua, she denies any UTI symptoms at this time.  Patient is asking if it is ok to start her Eliquis back?  Please advise.

## 2022-10-09 NOTE — Telephone Encounter (Signed)
Pt informed

## 2022-10-14 ENCOUNTER — Telehealth: Payer: Self-pay

## 2022-10-14 NOTE — Telephone Encounter (Signed)
Made patient aware that her urine did not indicated a UTI. Verbal order from Dr. Alyson Ingles to take 2 cups of Miralax today and then once a day for the next week. Patient was concern if she can take Miralax and her rx IBS medication. Per Dr. Alyson Ingles it would be okay to take both. Patient voiced understanding

## 2022-10-14 NOTE — Telephone Encounter (Signed)
Verbal from Dr. Alyson Ingles to see if pt is moving her bowels, the pulling feeling pt feels is normal after surgery.  Pt's sister states pt has not had a BM since Friday.  Verbal orders from Dr. Alyson Ingles to take 2 caps of Miralax today then once a day for the next week.  Patient's sister is aware of MD instructions and voiced understanding.

## 2022-10-14 NOTE — Telephone Encounter (Signed)
Pt's sister states that since Saturday patient has been having cramps and pain in bottom of her abdomen.  Per states when she stands it feels like something is pulling and feels like she is having labor pains.  She denies any fever, pain meds are not easing the pain.  Pt's sister is asking if this is normal and wants to know what they can expect post surgery.  Please advise.

## 2022-10-15 ENCOUNTER — Other Ambulatory Visit: Payer: Self-pay

## 2022-10-15 ENCOUNTER — Encounter (HOSPITAL_COMMUNITY): Payer: Self-pay

## 2022-10-15 ENCOUNTER — Emergency Department (HOSPITAL_COMMUNITY): Payer: Managed Care, Other (non HMO)

## 2022-10-15 ENCOUNTER — Emergency Department (HOSPITAL_COMMUNITY)
Admission: EM | Admit: 2022-10-15 | Discharge: 2022-10-15 | Disposition: A | Discharge status: Home / Self Care | Payer: Managed Care, Other (non HMO) | Attending: Emergency Medicine | Admitting: Emergency Medicine

## 2022-10-15 DIAGNOSIS — Z20822 Contact with and (suspected) exposure to covid-19: Secondary | ICD-10-CM | POA: Insufficient documentation

## 2022-10-15 DIAGNOSIS — Z905 Acquired absence of kidney: Secondary | ICD-10-CM | POA: Insufficient documentation

## 2022-10-15 DIAGNOSIS — R1084 Generalized abdominal pain: Secondary | ICD-10-CM | POA: Insufficient documentation

## 2022-10-15 LAB — COMPREHENSIVE METABOLIC PANEL
ALT: 10 U/L (ref 0–44)
AST: 23 U/L (ref 15–41)
Albumin: 2.9 g/dL — ABNORMAL LOW (ref 3.5–5.0)
Alkaline Phosphatase: 41 U/L (ref 38–126)
Anion gap: 4 — ABNORMAL LOW (ref 5–15)
BUN: 23 mg/dL — ABNORMAL HIGH (ref 6–20)
CO2: 26 mmol/L (ref 22–32)
Calcium: 8.8 mg/dL — ABNORMAL LOW (ref 8.9–10.3)
Chloride: 105 mmol/L (ref 98–111)
Creatinine, Ser: 1.53 mg/dL — ABNORMAL HIGH (ref 0.44–1.00)
GFR, Estimated: 40 mL/min — ABNORMAL LOW (ref >60)
Glucose, Bld: 93 mg/dL (ref 70–99)
Potassium: 5 mmol/L (ref 3.5–5.1)
Sodium: 135 mmol/L (ref 135–145)
Total Bilirubin: 0.5 mg/dL (ref 0.3–1.2)
Total Protein: 6.4 g/dL — ABNORMAL LOW (ref 6.5–8.1)

## 2022-10-15 LAB — CBC WITH DIFFERENTIAL/PLATELET
Abs Immature Granulocytes: 0.05 10*3/uL (ref 0.00–0.07)
Basophils Absolute: 0.1 10*3/uL (ref 0.0–0.1)
Basophils Relative: 1 %
Eosinophils Absolute: 1 10*3/uL — ABNORMAL HIGH (ref 0.0–0.5)
Eosinophils Relative: 9 %
HCT: 38.4 % (ref 36.0–46.0)
Hemoglobin: 12.2 g/dL (ref 12.0–15.0)
Immature Granulocytes: 1 %
Lymphocytes Relative: 25 %
Lymphs Abs: 2.7 10*3/uL (ref 0.7–4.0)
MCH: 32.9 pg (ref 26.0–34.0)
MCHC: 31.8 g/dL (ref 30.0–36.0)
MCV: 103.5 fL — ABNORMAL HIGH (ref 80.0–100.0)
Monocytes Absolute: 0.8 10*3/uL (ref 0.1–1.0)
Monocytes Relative: 7 %
Neutro Abs: 6.2 10*3/uL (ref 1.7–7.7)
Neutrophils Relative %: 57 %
Platelets: 554 10*3/uL — ABNORMAL HIGH (ref 150–400)
RBC: 3.71 MIL/uL — ABNORMAL LOW (ref 3.87–5.11)
RDW: 14.1 % (ref 11.5–15.5)
WBC: 10.8 10*3/uL — ABNORMAL HIGH (ref 4.0–10.5)
nRBC: 0 % (ref 0.0–0.2)

## 2022-10-15 LAB — RESP PANEL BY RT-PCR (FLU A&B, COVID) ARPGX2
Influenza A by PCR: NEGATIVE
Influenza B by PCR: NEGATIVE
SARS Coronavirus 2 by RT PCR: NEGATIVE

## 2022-10-15 LAB — LIPASE, BLOOD: Lipase: 25 U/L (ref 11–51)

## 2022-10-15 MED ORDER — HYDROMORPHONE HCL 1 MG/ML IJ SOLN
1.0000 mg | Freq: Once | INTRAMUSCULAR | Status: AC
  Administered 2022-10-15: 1 mg via INTRAVENOUS
  Filled 2022-10-15: qty 1

## 2022-10-15 MED ORDER — IOHEXOL 300 MG/ML  SOLN
75.0000 mL | Freq: Once | INTRAMUSCULAR | Status: AC | PRN
  Administered 2022-10-15: 75 mL via INTRAVENOUS

## 2022-10-15 MED ORDER — SODIUM CHLORIDE 0.9 % IV SOLN: INTRAVENOUS | Status: DC

## 2022-10-15 MED ORDER — ONDANSETRON HCL 4 MG/2ML IJ SOLN
4.0000 mg | Freq: Once | INTRAMUSCULAR | Status: AC
  Administered 2022-10-15: 4 mg via INTRAVENOUS
  Filled 2022-10-15: qty 2

## 2022-10-15 NOTE — ED Provider Notes (Signed)
[facility] EMERGENCY DEPARTMENT Provider Note   CSN: [id number] Arrival date & time: 10/15/22  2993     History  Chief Complaint  Patient presents with   Abdominal Pain    Olivia Woodward is a 56 y.o. female.  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  This was done November 13.  Done by Dr. Alyson Ingles here.  Urology.  Patient presenting today with generalized abdominal pain cramping nausea.  Patient not really having a cough not really having congestion no runny nose.  Overall feeling a little fatigued with some generalized weakness.  Patient mostly concerned about the abdominal pain.  Temp here 97.7 blood pressure 97/66.  Saturation is 98%.  Respirations 17 pulse is 62.       Home Medications Prior to Admission medications   Medication Sig Start Date End Date Taking? Authorizing Provider  ALPRAZolam (XANAX) 0.25 MG tablet Take 1 tablet (0.25 mg total) by mouth 2 (two) times daily as needed for anxiety. 10/13/22 11/12/22 Yes Hisada, Elie Goody, MD  amLODipine (NORVASC) 10 MG tablet Take 1 tablet (10 mg total) by mouth daily. 06/21/22  Yes Alvira Monday, FNP  cariprazine (VRAYLAR) 3 MG capsule Take 1 capsule (3 mg total) by mouth daily. 09/09/22 11/08/22 Yes Hisada, Elie Goody, MD  Cyanocobalamin (VITAMIN B12) 1000 MCG TBCR Take 2 tablets by mouth once daily 04/17/22  Yes Fayrene Helper, MD  ergocalciferol (VITAMIN D2) 1.25 MG (50000 UT) capsule Take 1 capsule (50,000 Units total) by mouth once a week. One capsule once weekly 04/17/22  Yes Fayrene Helper, MD  escitalopram (LEXAPRO) 20 MG tablet Take 1 tablet (20 mg total) by mouth daily. 08/21/22 12/19/22 Yes Hisada, Elie Goody, MD  Eszopiclone 3 MG TABS Take 1 tablet (3 mg total) by mouth at bedtime. 10/16/22 11/15/22 Yes Hisada, Elie Goody, MD  HYDROcodone-acetaminophen (NORCO/VICODIN) 5-325 MG tablet Take 1 tablet by mouth every 4 (four) hours as needed for severe pain. 10/07/22  Yes Summerlin, Berneice Heinrich,  PA-C  linaclotide [facility]) 290 MCG CAPS capsule Take 1 capsule (290 mcg total) by mouth daily before breakfast. 07/18/22  Yes Erenest Rasher, PA-C  pantoprazole (PROTONIX) 40 MG tablet Take 1 tablet (40 mg total) by mouth daily before breakfast. 07/18/22  Yes Erenest Rasher, PA-C  propranolol (INDERAL) 20 MG tablet Take 1 tablet (20 mg total) by mouth 2 (two) times daily. 07/30/22  Yes McCue, Janett Billow, NP  rizatriptan (MAXALT-MLT) 10 MG disintegrating tablet Take 1 tablet (10 mg total) by mouth as needed for migraine. May repeat in 2 hours if needed 04/16/22  Yes Fayrene Helper, MD  simethicone (GAS-X) 80 MG chewable tablet Chew 1 tablet (80 mg total) by mouth 4 (four) times daily as needed for flatulence. 10/03/22 10/03/23 Yes McKenzie, Candee Furbish, MD  spironolactone (ALDACTONE) 100 MG tablet Take 1 tablet by mouth once daily 09/26/22  Yes Fayrene Helper, MD  cyclobenzaprine (FLEXERIL) 10 MG tablet Take one tablet by mouth twice daily for 5 days , then as needed, for neck spasm Patient not taking: Reported on 10/15/2022 06/28/22   Fayrene Helper, MD  hydrocortisone (ANUSOL-HC) 2.5 % rectal cream Place 1 Application rectally 2 (two) times daily. Patient not taking: Reported on 10/15/2022 07/18/22   Erenest Rasher, PA-C  lamoTRIgine (LAMICTAL) 25 MG tablet Take 4 tablets (100 mg total) by mouth 2 (two) times daily. Week 1 25 mg bid po, Week 2- 25 mg in AM and 50 mg in PM, Week  3- 50 mg bid po, Week 4- 75 mg bid po, Week 5 100 mg bid po Patient not taking: Reported on 10/15/2022 06/04/22   Dohmeier, Asencion Partridge, MD  ondansetron [facility]) 4 MG tablet Take one tablet by mouth once daily, as needed, for nausea Patient not taking: Reported on 10/15/2022 04/16/22   Fayrene Helper, MD  ondansetron (ZOFRAN) 4 MG tablet Take 1 tablet (4 mg total) by mouth daily as needed for nausea or vomiting. Patient not taking: Reported on 10/15/2022 10/03/22 10/03/23  Cleon Gustin, MD   sulfamethoxazole-trimethoprim (BACTRIM DS) 800-160 MG tablet Take 1 tablet by mouth 2 (two) times daily. Patient not taking: Reported on 10/15/2022 09/03/22   Summerlin, Berneice Heinrich, PA-C  topiramate (TOPAMAX) 100 MG tablet Take 100 mg by mouth 2 (two) times daily. Patient not taking: Reported on 10/15/2022 05/16/22   [provider]      Allergies    Ketorolac tromethamine    Review of Systems   Review of Systems  Constitutional:  Negative for chills and fever.  HENT:  Negative for ear pain and sore throat.   Eyes:  Negative for pain and visual disturbance.  Respiratory:  Negative for cough and shortness of breath.   Cardiovascular:  Negative for chest pain and palpitations.  Gastrointestinal:  Positive for abdominal pain. Negative for vomiting.  Genitourinary:  Negative for dysuria and hematuria.  Musculoskeletal:  Negative for arthralgias and back pain.  Skin:  Positive for wound. Negative for color change and rash.  Neurological:  Negative for seizures and syncope.  All other systems reviewed and are negative.   Physical Exam Updated Vital Signs BP 110/80   Pulse 64   Temp 98 F (36.7 C) (Oral)   Resp 17   SpO2 91%  Physical Exam Vitals and nursing note reviewed.  Constitutional:      General: She is not in acute distress.    Appearance: She is well-developed. She is not ill-appearing or toxic-appearing.  HENT:     Head: Normocephalic and atraumatic.  Eyes:     Extraocular Movements: Extraocular movements intact.     Conjunctiva/sclera: Conjunctivae normal.     Pupils: Pupils are equal, round, and reactive to light.  Cardiovascular:     Rate and Rhythm: Normal rate and regular rhythm.     Heart sounds: No murmur heard. Pulmonary:     Effort: Pulmonary effort is normal. No respiratory distress.     Breath sounds: Normal breath sounds.  Abdominal:     General: Bowel sounds are normal. There is no distension.     Palpations: Abdomen is soft.      Tenderness: There is abdominal tenderness. There is no guarding.     Hernia: No hernia is present.     Comments: Some mild tenderness around the surgical wound sites more to the left.  All healing well.  No evidence of secondary infection.  Musculoskeletal:        General: No swelling.     Cervical back: Neck supple.  Skin:    General: Skin is warm and dry.     Capillary Refill: Capillary refill takes less than 2 seconds.  Neurological:     General: No focal deficit present.     Mental Status: She is alert and oriented to person, place, and time.  Psychiatric:        Mood and Affect: Mood normal.     ED Results / Procedures / Treatments   Labs (all labs ordered are  listed, but only abnormal results are displayed) Labs Reviewed  CBC WITH DIFFERENTIAL/PLATELET - Abnormal; Notable for the following components:      Result Value   WBC 10.8 (*)    RBC 3.71 (*)    MCV 103.5 (*)    Platelets 554 (*)    Eosinophils Absolute 1.0 (*)    All other components within normal limits  COMPREHENSIVE METABOLIC PANEL - Abnormal; Notable for the following components:   BUN 23 (*)    Creatinine, Ser 1.53 (*)    Calcium 8.8 (*)    Total Protein 6.4 (*)    Albumin 2.9 (*)    GFR, Estimated 40 (*)    Anion gap 4 (*)    All other components within normal limits  RESP PANEL BY RT-PCR (FLU A&B, COVID) ARPGX2  LIPASE, BLOOD  URINALYSIS, ROUTINE W REFLEX MICROSCOPIC    EKG None  Radiology CT Abdomen Pelvis W Contrast  Result Date: 10/15/2022 CLINICAL DATA:  Abdominal pain, acute, nonlocalized. Status post left nephrectomy 09/30/2022. EXAM: CT ABDOMEN AND PELVIS WITH CONTRAST TECHNIQUE: Multidetector CT imaging of the abdomen and pelvis was performed using the standard protocol following bolus administration of intravenous contrast. RADIATION DOSE REDUCTION: This exam was performed according to the departmental dose-optimization program which includes automated exposure control, adjustment of the  mA and/or kV according to patient size and/or use of iterative reconstruction technique. CONTRAST:  40m OMNIPAQUE IOHEXOL 300 MG/ML  SOLN COMPARISON:  06/21/2022 FINDINGS: Lower chest: No acute abnormality. Hepatobiliary: No focal liver abnormality is seen. Status post cholecystectomy. Stable mild intrahepatic biliary ductal dilation may represent post cholecystectomy change. Pancreas: Unremarkable Spleen: 10 mm hypodensity within the inferior pole of the spleen is better assessed on prior MRI examination of 05/30/2022 and is compatible with a benign lymphangioma. The spleen is otherwise unremarkable. Adrenals/Urinary Tract: 17 mm right adrenal adenoma, better characterized on prior MRI examination, is unchanged. The right kidney is unremarkable. Left adrenal gland is unremarkable. Interval left nephrectomy. Small amount of unencapsulated fluid within the nephrectomy bed measures 14 x 31 mm at axial image # 39/2, likely postsurgical in nature. The bladder is unremarkable. Stomach/Bowel: Surgical changes of Roux-en-Y gastric bypass are identified. Small free intraperitoneal gas is present and there is moderate subcutaneous gas and fluid within the left anterior abdominal wall, likely related to recent laparoscopic nephrectomy. The stomach, small bowel, and large bowel are otherwise unremarkable and there is no evidence of obstruction or focal inflammation. Appendix normal. Vascular/Lymphatic: No significant vascular findings are present. No enlarged abdominal or pelvic lymph nodes. Reproductive: Status post hysterectomy. No adnexal masses. Other: Small fat containing umbilical hernia. Trace free fluid within the pelvis may be postsurgical in nature. No loculated intra-abdominal fluid collections are identified. Musculoskeletal: No acute or significant osseous findings. IMPRESSION: 1. Interval left nephrectomy. Small amount of unencapsulated fluid within the nephrectomy bed, likely postsurgical in nature. Moderate  subcutaneous gas and fluid within the left anterior abdominal wall and small free air, likely related to recent laparoscopic nephrectomy. 2. Surgical changes of Roux-en-Y gastric bypass. No evidence of obstruction or focal inflammation. 3. Stable 17 mm benign right adrenal adenoma. 4. Stable 10 mm hypodensity within the inferior pole of the spleen, better assessed on prior MRI examination of 05/30/2022 and compatible with a benign lymphangioma. Electronically Signed   By: AFidela SalisburyM.D.   On: 10/15/2022 12:54   DG Chest Port 1 View  Result Date: 10/15/2022 CLINICAL DATA:  Weakness. EXAM: PORTABLE CHEST 1 VIEW COMPARISON:  10/02/2022 FINDINGS: The cardiac silhouette, mediastinal and hilar contours are stable. Low lung volumes with vascular crowding and bibasilar atelectasis. No infiltrates or pneumothorax. Cystic appearing changes at the right lung base and suspect a small amount of residual pneumoperitoneum. This could be related to prior surgery. Recommend clinical correlation with abdominal findings. IMPRESSION: 1. Low lung volumes with vascular crowding and bibasilar atelectasis. 2. Suspect small amount of residual pneumoperitoneum. Recommend correlation with clinical findings. Electronically Signed   By: Marijo Sanes M.D.   On: 10/15/2022 09:56    Procedures Procedures    Medications Ordered in ED Medications  0.9 %  sodium chloride infusion ( Intravenous New Bag/Given 10/15/22 0946)  ondansetron (ZOFRAN) injection 4 mg (4 mg Intravenous Given 10/15/22 1013)  HYDROmorphone (DILAUDID) injection 1 mg (1 mg Intravenous Given 10/15/22 1013)  iohexol (OMNIPAQUE) 300 MG/ML solution 75 mL (75 mLs Intravenous Contrast Given 10/15/22 1216)  HYDROmorphone (DILAUDID) injection 1 mg (1 mg Intravenous Given 10/15/22 1320)  ondansetron (ZOFRAN) injection 4 mg (4 mg Intravenous Given 10/15/22 1415)    ED Course/ Medical Decision Making/ A&P                           Medical Decision Making Amount  and/or Complexity of Data Reviewed Labs: ordered. Radiology: ordered.  Risk Prescription drug management.   For the abdominal pain without any acute findings.  Patient's complete metabolic panel normal creatinine up a little bit at 1.53 GFR 40 but not significantly changed from previous.  Probably up a little bit due to the removal of the 1 kidney but that kidney was apparently nonfunctional so should have had any major effect on that.  Lipase was normal.  CBC white count 10.8 hemoglobin 12.2 platelets are normal.  COVID and influenza is negative.  CT scan of the abdomen interval left nephrectomy small amount of encapsulated fluid within the nephrectomy bed likely postsurgical in nature little bit of free air in the abdomen not unexpected.  And surgical changes of the Roux-en-Y gastric bypass no evidence obstruction or focal inflammation.  I did make mention of a stable 17 mm benign righted adrenal adenoma.  A stable 10 mm hypodensity within the inferior pole of the spleen.  Patient feeling better here with pain medicine.  Patient does have hydrocodone and Zofran to take at home.  Patient reassured that no significant abnormalities.  Has an appointment to follow-up with urology in December.   Final Clinical Impression(s) / ED Diagnoses Final diagnoses:  Generalized abdominal pain  Status post nephrectomy    Rx / DC Orders ED Discharge Orders     None         Fredia Sorrow, MD 10/15/22 1510

## 2022-10-15 NOTE — Discharge Instructions (Signed)
Urology appointment is scheduled for December 22.  Today's workup for the abdominal pain without any acute findings.  No problems secondary to the surgery.  Take the hydrocodone that you have at home and the Zofran that you have at home for the nausea and vomiting.  Return for any new or worse symptoms.

## 2022-10-15 NOTE — ED Triage Notes (Signed)
Patient reports abdominal pain that she describes as cramping with nausea, cough, chest congestion, runny nose, generalized weakness, occasional SHOB since Saturday. Recently had left kidney removed two weeks ago.

## 2022-10-16 ENCOUNTER — Telehealth: Payer: Self-pay

## 2022-10-16 ENCOUNTER — Telehealth (HOSPITAL_COMMUNITY): Payer: Self-pay | Admitting: Psychiatry

## 2022-10-16 NOTE — Telephone Encounter (Signed)
Transition Care Management Unsuccessful Follow-up Telephone Call  Date of discharge and from where:  [facility] 10-15-22 Dx: generalized abdominal pain   Attempts:  1st Attempt  Reason for unsuccessful TCM follow-up call:  Left voice message    Juanda Crumble LPN [facility] Direct Dial [phone]

## 2022-10-17 ENCOUNTER — Ambulatory Visit: Payer: Managed Care, Other (non HMO) | Admitting: Family Medicine

## 2022-10-17 ENCOUNTER — Encounter: Payer: Self-pay | Admitting: Family Medicine

## 2022-10-17 DIAGNOSIS — N1339 Other hydronephrosis: Secondary | ICD-10-CM

## 2022-10-17 DIAGNOSIS — R110 Nausea: Secondary | ICD-10-CM

## 2022-10-17 DIAGNOSIS — E860 Dehydration: Secondary | ICD-10-CM | POA: Insufficient documentation

## 2022-10-17 DIAGNOSIS — R39198 Other difficulties with micturition: Secondary | ICD-10-CM

## 2022-10-17 DIAGNOSIS — R8290 Unspecified abnormal findings in urine: Secondary | ICD-10-CM | POA: Diagnosis not present

## 2022-10-17 DIAGNOSIS — I10 Essential (primary) hypertension: Secondary | ICD-10-CM

## 2022-10-17 LAB — POCT URINALYSIS DIP (CLINITEK)
Bilirubin, UA: NEGATIVE
Blood, UA: NEGATIVE
Glucose, UA: NEGATIVE mg/dL
Ketones, POC UA: NEGATIVE mg/dL
Leukocytes, UA: NEGATIVE
Nitrite, UA: NEGATIVE
POC PROTEIN,UA: NEGATIVE
Spec Grav, UA: 1.02 (ref 1.010–1.025)
Urobilinogen, UA: >=8 E.U./dL — AB
pH, UA: 6 (ref 5.0–8.0)

## 2022-10-17 MED ORDER — PROMETHAZINE HCL 25 MG PO TABS: ORAL_TABLET | ORAL | 0 refills | Status: DC

## 2022-10-17 NOTE — Assessment & Plan Note (Signed)
Resolved with surgery on 09/30/2022

## 2022-10-17 NOTE — Assessment & Plan Note (Signed)
Uncontrolled, change to phenergan, hydrocodone triggers the nausea

## 2022-10-17 NOTE — Addendum Note (Signed)
Addended by: Jill Side on: 10/17/2022 10:21 AM   Modules accepted: Orders

## 2022-10-17 NOTE — Assessment & Plan Note (Signed)
cCUA , no evidence of infection

## 2022-10-17 NOTE — Telephone Encounter (Signed)
Pt seen PCP- TCM note closed

## 2022-10-17 NOTE — Patient Instructions (Signed)
Follow-up in December as before, call if you need me sooner.  Urine tested today shows no sign of infection.  Recent labs showed that you are mildly dehydrated.  You need to ensure that you drink 64 ounces of water every day.   Phenergan  Is prescribed and limited  supply for the next several days while you still need to take hydrocodone for pain relief.  Please get nonfasting Chem-7 and EGFR 2 to 3 days before your December appointment.  Thanks for choosing [facility], we consider it a privelige to serve you.

## 2022-10-17 NOTE — Progress Notes (Signed)
Virtual Visit via Telephone Note  I connected with Olivia Woodward on 10/17/22 at  9:40 AM EST by telephone and verified that I am speaking with the correct person using two identifiers.  Location: Patient: office Provider: home   I discussed the limitations, risks, security and privacy concerns of performing an evaluation and management service by telephone and the availability of in person appointments. I also discussed with the patient that there may be a patient responsible charge related to this service. The patient expressed understanding and agreed to proceed.   History of Present Illness: 2 week h/o difficulty voiding and 2 day h/o malaodorous urine, no report of fever or chills,had nephrectomy 2 weeks ago , recovering well overall, having nausea not relieved with zofran with hydrocodone   Observations/Objective: There were no vitals taken for this visit. Good communication with no confusion and intact memory. Alert and oriented x 3No signs of respiratory distress during speech   Assessment and Plan:  Malodorous urine cCUA , no evidence of infection  Dehydration Needs to commit to 64 ounces water daily, rept chem7 prior to next visit  Nausea Uncontrolled, change to phenergan, hydrocodone triggers the nausea  Hydronephrosis Resolved with surgery on 09/30/2022   Follow Up Instructions:    I discussed the assessment and treatment plan with the patient. The patient was provided an opportunity to ask questions and all were answered. The patient agreed with the plan and demonstrated an understanding of the instructions.   The patient was advised to call back or seek an in-person evaluation if the symptoms worsen or if the condition fails to improve as anticipated.  I provided 14 minutes of non-face-to-face time during this encounter.   Tula Nakayama, MD

## 2022-10-17 NOTE — Assessment & Plan Note (Signed)
Needs to commit to 64 ounces water daily, rept chem7 prior to next visit

## 2022-10-18 ENCOUNTER — Other Ambulatory Visit: Payer: Self-pay | Admitting: Family Medicine

## 2022-10-21 ENCOUNTER — Telehealth (HOSPITAL_COMMUNITY): Payer: Self-pay | Admitting: Psychiatry

## 2022-10-28 ENCOUNTER — Telehealth (HOSPITAL_COMMUNITY): Payer: Self-pay | Admitting: Psychiatry

## 2022-10-28 ENCOUNTER — Encounter (HOSPITAL_COMMUNITY): Payer: Self-pay | Admitting: Psychiatry

## 2022-10-28 ENCOUNTER — Ambulatory Visit (INDEPENDENT_AMBULATORY_CARE_PROVIDER_SITE_OTHER): Payer: 59 | Admitting: Psychiatry

## 2022-10-28 DIAGNOSIS — F332 Major depressive disorder, recurrent severe without psychotic features: Secondary | ICD-10-CM | POA: Diagnosis not present
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
## 2022-10-29 ENCOUNTER — Encounter: Payer: Self-pay | Admitting: Family Medicine

## 2022-10-29 ENCOUNTER — Ambulatory Visit: Payer: Managed Care, Other (non HMO) | Admitting: Family Medicine
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
## 2022-11-04 ENCOUNTER — Telehealth (INDEPENDENT_AMBULATORY_CARE_PROVIDER_SITE_OTHER): Payer: 59 | Admitting: Psychiatry

## 2022-11-04 ENCOUNTER — Encounter: Payer: Self-pay | Admitting: Psychiatry

## 2022-11-04 DIAGNOSIS — G4700 Insomnia, unspecified: Secondary | ICD-10-CM

## 2022-11-04 DIAGNOSIS — F330 Major depressive disorder, recurrent, mild: Secondary | ICD-10-CM

## 2022-11-04 MED ORDER — CARIPRAZINE HCL 3 MG PO CAPS: 3.0000 mg | ORAL_CAPSULE | Freq: Every day | ORAL | 1 refills | Status: DC

## 2022-11-04 MED ORDER — ALPRAZOLAM 0.25 MG PO TABS: 0.2500 mg | ORAL_TABLET | Freq: Two times a day (BID) | ORAL | 1 refills | Status: DC | PRN

## 2022-11-04 NOTE — Patient Instructions (Signed)
Continue lexapro 20 mg daily Continue Vraylar 3 mg daily  Continue Lunesta 3 mg nightly as needed for insomnia Continue Xanax 0.25 mg twice a day as needed for anxiety  Referred to evaluation of sleep apnea Next appointment: 1/31 at 9 AM

## 2022-11-06 ENCOUNTER — Other Ambulatory Visit: Payer: Self-pay | Admitting: Psychiatry

## 2022-11-06 ENCOUNTER — Telehealth (HOSPITAL_COMMUNITY): Payer: Self-pay | Admitting: Psychiatry

## 2022-11-06 NOTE — Telephone Encounter (Signed)
D:  Placed call to check in on patient since her medical procedure.  Pt states she is dong fine.  Pt states she is interested in returning for her TMS taper, but she hasn't been released to drive.  Has upcoming appt with her doctor on 11-08-22 and is hoping he releases her then.  Pt is requesting to have her appts at 8 a.m when she does return.  "I have to be able to go to work at 9:30 a.m."  Informed pt to call the coordinator after her appt on 11-08-22.  A:  Interim Coordinator will inform [facility] team.  R: Pt receptive.

## 2022-11-08 ENCOUNTER — Ambulatory Visit: Payer: Managed Care, Other (non HMO) | Admitting: Urology

## 2022-11-08 VITALS — BP 96/65 | HR 55

## 2022-11-08 DIAGNOSIS — N3001 Acute cystitis with hematuria: Secondary | ICD-10-CM

## 2022-11-08 DIAGNOSIS — N132 Hydronephrosis with renal and ureteral calculous obstruction: Secondary | ICD-10-CM

## 2022-11-08 LAB — MICROSCOPIC EXAMINATION

## 2022-11-08 LAB — URINALYSIS, ROUTINE W REFLEX MICROSCOPIC
Bilirubin, UA: NEGATIVE
Glucose, UA: NEGATIVE
Leukocytes,UA: NEGATIVE
Nitrite, UA: NEGATIVE
RBC, UA: NEGATIVE
Specific Gravity, UA: 1.025 (ref 1.005–1.030)
Urobilinogen, Ur: 2 mg/dL — ABNORMAL HIGH (ref 0.2–1.0)
pH, UA: 5 (ref 5.0–7.5)

## 2022-11-08 MED ORDER — NITROFURANTOIN MONOHYD MACRO 100 MG PO CAPS: 100.0000 mg | ORAL_CAPSULE | Freq: Two times a day (BID) | ORAL | 0 refills | Status: DC

## 2022-11-08 NOTE — Progress Notes (Signed)
11/08/2022 12:37 PM   Olivia Woodward 07-05-1966 [id number]  Referring provider: Fayrene Helper, MD [address], [place],  [place] [address]  Followup after nephrectomy   HPI: Olivia Woodward is a 56yo here for followup for simple nephrectomy. She denies any incisional or flank pain. She has had mild dysuria and increased urinary frequency for the past 4-5 days. UA is concerning for infection. No other complaints.    PMH: Past Medical History:  Diagnosis Date   ANEMIA 07/25/2010   Anxiety    Phreesia 03/18/2021   Depression    Phreesia 03/18/2021   Generalized headaches    GERD (gastroesophageal reflux disease)    Hypertension     Surgical History: Past Surgical History:  Procedure Laterality Date   ABDOMINAL HYSTERECTOMY     fibroids, partial   BREAST REDUCTION SURGERY  2005   CHOLECYSTECTOMY N/A 05/27/2022   Procedure: LAPAROSCOPIC CHOLECYSTECTOMY;  Surgeon: Aviva Signs, MD;  Location: [facility];  Service: General;  Laterality: N/A;   COLONOSCOPY WITH PROPOFOL N/A 05/10/2019   Two 5 to 6 mm polyps in the sigmoid colon and in the ascending colon, removed with a   GASTRIC BYPASS  2007   LAPAROSCOPIC NEPHRECTOMY     POLYPECTOMY  05/10/2019   Procedure: POLYPECTOMY;  Surgeon: Daneil Dolin, MD;  [facility];  Service: Endoscopy;;  colon   ROBOT ASSISTED LAPAROSCOPIC NEPHRECTOMY Left 09/30/2022   Procedure: XI ROBOTIC ASSISTED LAPAROSCOPIC NEPHRECTOMY;  Surgeon: Cleon Gustin, MD;  Location: [facility];  Service: Urology;  Laterality: Left;    Home Medications:  Allergies as of 11/08/2022       Reactions   Ketorolac Tromethamine Hives   Lips swelled        Medication List        Accurate as of November 08, 2022 12:37 PM. If you have any questions, ask your nurse or doctor.          ALPRAZolam 0.25 MG tablet Commonly known as: XANAX Take 1 tablet (0.25 mg total) by mouth 2 (two) times daily as needed for anxiety. Start  taking on: November 12, 2022   amLODipine 10 MG tablet Commonly known as: NORVASC Take 1 tablet (10 mg total) by mouth daily.   cariprazine 3 MG capsule Commonly known as: Vraylar Take 1 capsule (3 mg total) by mouth daily. Start taking on: November 09, 2022   ergocalciferol 1.25 MG (50000 UT) capsule Commonly known as: VITAMIN D2 Take 1 capsule (50,000 Units total) by mouth once a week. One capsule once weekly   escitalopram 20 MG tablet Commonly known as: Lexapro Take 1 tablet (20 mg total) by mouth daily.   Eszopiclone 3 MG Tabs Take 1 tablet (3 mg total) by mouth at bedtime as needed. insomnia Start taking on: November 10, 2022   HYDROcodone-acetaminophen 5-325 MG tablet Commonly known as: NORCO/VICODIN Take 1 tablet by mouth every 4 (four) hours as needed for severe pain.   lamoTRIgine 25 MG tablet Commonly known as: LaMICtal Take 4 tablets (100 mg total) by mouth 2 (two) times daily. Week 1 25 mg bid po, Week 2- 25 mg in AM and 50 mg in PM, Week 3- 50 mg bid po, Week 4- 75 mg bid po, Week 5 100 mg bid po   linaclotide 290 MCG Caps capsule Commonly known as: Linzess Take 1 capsule (290 mcg total) by mouth daily before breakfast.   nitrofurantoin (macrocrystal-monohydrate) 100 MG capsule Commonly known as:  MACROBID Take 1 capsule (100 mg total) by mouth every 12 (twelve) hours.   ondansetron 4 MG tablet Commonly known as: Zofran Take one tablet by mouth once daily, as needed, for nausea   ondansetron 4 MG tablet Commonly known as: Zofran Take 1 tablet (4 mg total) by mouth daily as needed for nausea or vomiting.   pantoprazole 40 MG tablet Commonly known as: PROTONIX Take 1 tablet (40 mg total) by mouth daily before breakfast.   promethazine 25 MG tablet Commonly known as: PHENERGAN Take one tablet by mouth two times daily, as needed, for nausea   propranolol 20 MG tablet Commonly known as: INDERAL Take 1 tablet (20 mg total) by mouth 2 (two) times  daily.   rizatriptan 10 MG disintegrating tablet Commonly known as: Maxalt-MLT Take 1 tablet (10 mg total) by mouth as needed for migraine. May repeat in 2 hours if needed   simethicone 80 MG chewable tablet Commonly known as: Gas-X Chew 1 tablet (80 mg total) by mouth 4 (four) times daily as needed for flatulence.   spironolactone 100 MG tablet Commonly known as: ALDACTONE Take 1 tablet by mouth once daily   topiramate 100 MG tablet Commonly known as: TOPAMAX Take 100 mg by mouth 2 (two) times daily.   Vitamin B12 1000 MCG Tbcr Take 2 tablets by mouth once daily        Allergies:  Allergies  Allergen Reactions   Ketorolac Tromethamine Hives    Lips swelled    Family History: Family History  Problem Relation Age of Onset   Hypertension Mother    Diabetes Mother    Hypertension Father    Hypertension Sister    Hypertension Sister    Colon cancer Neg Hx     Social History:  reports that she has never smoked. She has never used smokeless tobacco. She reports that she does not drink alcohol and does not use drugs.  ROS: All other review of systems were reviewed and are negative except what is noted above in HPI  Physical Exam: BP 96/65   Pulse (!) 55   Constitutional:  Alert and oriented, No acute distress. HEENT: [place] AT, moist mucus membranes.  Trachea midline, no masses. Cardiovascular: No clubbing, cyanosis, or edema. Respiratory: Normal respiratory effort, no increased work of breathing. GI: Abdomen is soft, nontender, nondistended, no abdominal masses GU: No CVA tenderness.  Lymph: No cervical or inguinal lymphadenopathy. Skin: No rashes, bruises or suspicious lesions. Neurologic: Grossly intact, no focal deficits, moving all 4 extremities. Psychiatric: Normal mood and affect.  Laboratory Data: Lab Results  Component Value Date   WBC 10.8 (H) 10/15/2022   HGB 12.2 10/15/2022   HCT 38.4 10/15/2022   MCV 103.5 (H) 10/15/2022   PLT 554 (H) 10/15/2022     Lab Results  Component Value Date   CREATININE 1.53 (H) 10/15/2022    No results found for: "PSA"  No results found for: "TESTOSTERONE"  Lab Results  Component Value Date   HGBA1C 5.4 03/22/2021    Urinalysis    Component Value Date/Time   COLORURINE AMBER (A) 06/21/2022 1828   APPEARANCEUR Cloudy (A) 10/07/2022 1319   LABSPEC >1.046 (H) 06/21/2022 1828   PHURINE 6.0 06/21/2022 1828   GLUCOSEU Negative 10/07/2022 1319   HGBUR SMALL (A) 06/21/2022 1828   BILIRUBINUR negative 10/17/2022 0949   BILIRUBINUR Negative 10/07/2022 1319   KETONESUR negative 10/17/2022 [place] 06/21/2022 1828   PROTEINUR 2+ (A) 10/07/2022 1319  PROTEINUR 100 (A) 06/21/2022 1828   UROBILINOGEN >=8.0 (A) 10/17/2022 0949   NITRITE Negative 10/17/2022 0949   NITRITE Negative 10/07/2022 1319   NITRITE NEGATIVE 06/21/2022 1828   LEUKOCYTESUR Negative 10/17/2022 0949   LEUKOCYTESUR Negative 10/07/2022 1319   LEUKOCYTESUR LARGE (A) 06/21/2022 1828    Lab Results  Component Value Date   LABMICR See below: 10/07/2022   WBCUA 0-5 10/07/2022   LABEPIT 0-10 10/07/2022   BACTERIA Few 10/07/2022    Pertinent Imaging:  No results found for this or any previous visit.  No results found for this or any previous visit.  No results found for this or any previous visit.  No results found for this or any previous visit.  No results found for this or any previous visit.  No valid procedures specified. No results found for this or any previous visit.  No results found for this or any previous visit.   Assessment & Plan:    1. Hydronephrosis with urinary obstruction due to renal calculus -BMP today. Followup 6 months  - Urinalysis, Routine w reflex microscopic - Urine Culture  2. Acute cystitis -Urine for culture -macrobid '100mg'$  BID for 7 days   No follow-ups on file.  Nicolette Bang, MD  [facility] [place]

## 2022-11-08 NOTE — Patient Instructions (Signed)
Urinary Tract Infection, Adult  A urinary tract infection (UTI) is an infection of any part of the urinary tract. The urinary tract includes the kidneys, ureters, bladder, and urethra. These organs make, store, and get rid of urine in the body. An upper UTI affects the ureters and kidneys. A lower UTI affects the bladder and urethra. What are the causes? Most urinary tract infections are caused by bacteria in your genital area around your urethra, where urine leaves your body. These bacteria grow and cause inflammation of your urinary tract. What increases the risk? You are more likely to develop this condition if: You have a urinary catheter that stays in place. You are not able to control when you urinate or have a bowel movement (incontinence). You are female and you: Use a spermicide or diaphragm for birth control. Have low estrogen levels. Are pregnant. You have certain genes that increase your risk. You are sexually active. You take antibiotic medicines. You have a condition that causes your flow of urine to slow down, such as: An enlarged prostate, if you are female. Blockage in your urethra. A kidney stone. A nerve condition that affects your bladder control (neurogenic bladder). Not getting enough to drink, or not urinating often. You have certain medical conditions, such as: Diabetes. A weak disease-fighting system (immunesystem). Sickle cell disease. Gout. Spinal cord injury. What are the signs or symptoms? Symptoms of this condition include: Needing to urinate right away (urgency). Frequent urination. This may include small amounts of urine each time you urinate. Pain or burning with urination. Blood in the urine. Urine that smells bad or unusual. Trouble urinating. Cloudy urine. Vaginal discharge, if you are female. Pain in the abdomen or the lower back. You may also have: Vomiting or a decreased appetite. Confusion. Irritability or tiredness. A fever or  chills. Diarrhea. The first symptom in older adults may be confusion. In some cases, they may not have any symptoms until the infection has worsened. How is this diagnosed? This condition is diagnosed based on your medical history and a physical exam. You may also have other tests, including: Urine tests. Blood tests. Tests for STIs (sexually transmitted infections). If you have had more than one UTI, a cystoscopy or imaging studies may be done to determine the cause of the infections. How is this treated? Treatment for this condition includes: Antibiotic medicine. Over-the-counter medicines to treat discomfort. Drinking enough water to stay hydrated. If you have frequent infections or have other conditions such as a kidney stone, you may need to see a health care provider who specializes in the urinary tract (urologist). In rare cases, urinary tract infections can cause sepsis. Sepsis is a life-threatening condition that occurs when the body responds to an infection. Sepsis is treated in the hospital with IV antibiotics, fluids, and other medicines. Follow these instructions at home:  Medicines Take over-the-counter and prescription medicines only as told by your health care provider. If you were prescribed an antibiotic medicine, take it as told by your health care provider. Do not stop using the antibiotic even if you start to feel better. General instructions Make sure you: Empty your bladder often and completely. Do not hold urine for long periods of time. Empty your bladder after sex. Wipe from front to back after urinating or having a bowel movement if you are female. Use each tissue only one time when you wipe. Drink enough fluid to keep your urine pale yellow. Keep all follow-up visits. This is important. Contact a health   care provider if: Your symptoms do not get better after 1-2 days. Your symptoms go away and then return. Get help right away if: You have severe pain in  your back or your lower abdomen. You have a fever or chills. You have nausea or vomiting. Summary A urinary tract infection (UTI) is an infection of any part of the urinary tract, which includes the kidneys, ureters, bladder, and urethra. Most urinary tract infections are caused by bacteria in your genital area. Treatment for this condition often includes antibiotic medicines. If you were prescribed an antibiotic medicine, take it as told by your health care provider. Do not stop using the antibiotic even if you start to feel better. Keep all follow-up visits. This is important. This information is not intended to replace advice given to you by your health care provider. Make sure you discuss any questions you have with your health care provider. Document Revised: 06/16/2020 Document Reviewed: 06/16/2020 Elsevier Patient Education  2023 [employer]  

## 2022-11-09 LAB — BASIC METABOLIC PANEL
BUN/Creatinine Ratio: 11 (ref 9–23)
BUN: 16 mg/dL (ref 6–24)
CO2: 22 mmol/L (ref 20–29)
Calcium: 9.5 mg/dL (ref 8.7–10.2)
Chloride: 106 mmol/L (ref 96–106)
Creatinine, Ser: 1.4 mg/dL — ABNORMAL HIGH (ref 0.57–1.00)
Glucose: 97 mg/dL (ref 70–99)
Potassium: 4.4 mmol/L (ref 3.5–5.2)
Sodium: 141 mmol/L (ref 134–144)
eGFR: 45 mL/min/{1.73_m2} — ABNORMAL LOW (ref >59)

## 2022-11-12 ENCOUNTER — Encounter (HOSPITAL_COMMUNITY): Payer: Self-pay | Admitting: Hematology

## 2022-11-12 LAB — URINE CULTURE: Organism ID, Bacteria: NO GROWTH

## 2022-11-15 ENCOUNTER — Encounter: Payer: Self-pay | Admitting: Urology

## 2022-11-19 ENCOUNTER — Telehealth: Payer: Self-pay | Admitting: Psychiatry

## 2022-11-19 NOTE — Telephone Encounter (Signed)
per patient she went back to work on the 27th they were closed on the 26th.

## 2022-11-19 NOTE — Telephone Encounter (Signed)
I see that we received a form for short term disability. My understanding is that she was planning to return to work on Dec 26th. Could you ask her if she was able to return there- if the form still needs to be filled out? I will send you a chat message about this as well to be sure, FYI.

## 2022-11-26 ENCOUNTER — Ambulatory Visit: Payer: Managed Care, Other (non HMO) | Admitting: Adult Health

## 2022-12-05 ENCOUNTER — Encounter: Payer: Self-pay | Admitting: Family Medicine

## 2022-12-05 ENCOUNTER — Ambulatory Visit (INDEPENDENT_AMBULATORY_CARE_PROVIDER_SITE_OTHER): Payer: Managed Care, Other (non HMO) | Admitting: Family Medicine

## 2022-12-05 VITALS — BP 120/81 | HR 92 | Ht 66.0 in | Wt 230.0 lb

## 2022-12-05 DIAGNOSIS — Z1231 Encounter for screening mammogram for malignant neoplasm of breast: Secondary | ICD-10-CM | POA: Diagnosis not present

## 2022-12-05 DIAGNOSIS — F5105 Insomnia due to other mental disorder: Secondary | ICD-10-CM

## 2022-12-05 DIAGNOSIS — I10 Essential (primary) hypertension: Secondary | ICD-10-CM

## 2022-12-05 DIAGNOSIS — F331 Major depressive disorder, recurrent, moderate: Secondary | ICD-10-CM

## 2022-12-05 DIAGNOSIS — F419 Anxiety disorder, unspecified: Secondary | ICD-10-CM

## 2022-12-05 DIAGNOSIS — F99 Mental disorder, not otherwise specified: Secondary | ICD-10-CM

## 2022-12-05 DIAGNOSIS — E559 Vitamin D deficiency, unspecified: Secondary | ICD-10-CM | POA: Diagnosis not present

## 2022-12-05 DIAGNOSIS — D539 Nutritional anemia, unspecified: Secondary | ICD-10-CM | POA: Insufficient documentation

## 2022-12-05 DIAGNOSIS — R110 Nausea: Secondary | ICD-10-CM

## 2022-12-05 DIAGNOSIS — G43009 Migraine without aura, not intractable, without status migrainosus: Secondary | ICD-10-CM

## 2022-12-05 MED ORDER — RIZATRIPTAN BENZOATE 10 MG PO TBDP: 10.0000 mg | ORAL_TABLET | ORAL | 3 refills | Status: DC | PRN

## 2022-12-05 MED ORDER — PROMETHAZINE HCL 25 MG PO TABS: ORAL_TABLET | ORAL | 5 refills | Status: DC

## 2022-12-05 MED ORDER — TOPIRAMATE 100 MG PO TABS: 100.0000 mg | ORAL_TABLET | Freq: Two times a day (BID) | ORAL | 5 refills | Status: DC

## 2022-12-05 NOTE — Assessment & Plan Note (Signed)
Sleep hygiene reviewed .Management per psych

## 2022-12-05 NOTE — Assessment & Plan Note (Signed)
Updated lab needed at/ before next visit.   

## 2022-12-05 NOTE — Patient Instructions (Addendum)
Annual exam early July, call if you need me sooner.   Labs today, vit d level, B12 level lipid CMP and EGFR TSH.   Please schedule mammogram earliest time of day at [facility] at checkout  Doing better, keep mpoving forward  It is important that you exercise regularly at least 30 minutes 5 times a week. If you develop chest pain, have severe difficulty breathing, or feel very tired, stop exercising immediately and seek medical attention  Think about what you will eat, plan ahead. Choose " clean, green, fresh or frozen" over canned, processed or packaged foods which are more sugary, salty and fatty. 70 to 75% of food eaten should be vegetables and fruit. Three meals at set times with snacks allowed between meals, but they must be fruit or vegetables. Aim to eat over a 12 hour period , example 7 am to 7 pm, and STOP after  your last meal of the day. Drink water,generally about 64 ounces per day, no other drink is as healthy. Fruit juice is best enjoyed in a healthy way, by EATING the fruit. Thanks for choosing [facility], we consider it a privelige to serve you.

## 2022-12-05 NOTE — Assessment & Plan Note (Addendum)
Unchanged, managed by Psych, reports increased symptoms now that she is back at work

## 2022-12-05 NOTE — Assessment & Plan Note (Signed)
Chronic and unchanged ?

## 2022-12-05 NOTE — Assessment & Plan Note (Signed)
Controlled, no change in medication  

## 2022-12-05 NOTE — Assessment & Plan Note (Signed)
  Patient re-educated about  the importance of commitment to a  minimum of 150 minutes of exercise per week as able.  The importance of healthy food choices with portion control discussed, as well as eating regularly and within a 12 hour window most days. The need to choose "clean , green" food 50 to 75% of the time is discussed, as well as to make water the primary drink and set a goal of 64 ounces water daily.       12/05/2022    8:09 AM 10/07/2022   11:11 AM 09/30/2022    6:42 AM  Weight /BMI  Weight 230 lb 0.6 oz 239 lb 9.6 oz 233 lb 0.4 oz  Height '5\' 6"'$  (1.676 m)  '5\' 6"'$  (1.676 m)  BMI 37.13 kg/m2 38.67 kg/m2 37.61 kg/m2

## 2022-12-05 NOTE — Progress Notes (Signed)
Olivia Woodward     MRN: [id number]      DOB: Aug 31, 1966   HPI Olivia Woodward is here for follow up and re-evaluation of chronic medical conditions, medication management and review of any available recent lab and radiology data.  Preventive health is updated, specifically  Cancer screening and Immunization.   Questions or concerns regarding consultations or procedures which the PT has had in the interim are  addressed. The PT denies any adverse reactions to current medications since the last visit.  There are no new concerns.  There are no specific complaints   ROS Denies recent fever or chills. Denies sinus pressure, nasal congestion, ear pain or sore throat. Denies chest congestion, productive cough or wheezing. Denies chest pains, palpitations and leg swelling Denies abdominal pain, nausea, vomiting,diarrhea or constipation.   Denies dysuria, frequency, hesitancy or incontinence. Denies joint pain, swelling and limitation in mobility. Denies headaches, seizures, numbness, or tingling. Chronic  depression, anxiety and  insomnia.Managed by Psych, improved slightly but still needs intense therapy Denies skin break down or rash.   PE  BP 120/81   Pulse 92   Ht '5\' 6"'$  (1.676 m)   Wt 230 lb 0.6 oz (104.3 kg)   SpO2 96%   BMI 37.13 kg/m   Patient alert and oriented and in no cardiopulmonary distress.  HEENT: No facial asymmetry, EOMI,     Neck supple .  Chest: Clear to auscultation bilaterally.  CVS: S1, S2 no murmurs, no S3.Regular rate.  ABD: Soft non tender.   Ext: No edema  MS: Adequate ROM spine, shoulders, hips and knees.  Skin: Intact, no ulcerations or rash noted.  Psych: Good eye contact, normal affect. Memory intact not anxious or depressed appearing.  CNS: CN 2-12 intact, power,  normal throughout.no focal deficits noted.   Assessment & Plan  Migraine headache Controlled, no change in medication   Essential hypertension .copn1 DASH diet and  commitment to daily physical activity for a minimum of 30 minutes discussed and encouraged, as a part of hypertension management. The importance of attaining a healthy weight is also discussed.     12/05/2022    8:09 AM 11/08/2022   12:26 PM 10/15/2022    3:19 PM 10/15/2022    2:00 PM 10/15/2022    1:00 PM 10/15/2022   12:00 PM 10/15/2022   11:30 AM  BP/Weight  Systolic BP 409 96 99 811 [phone]  Diastolic BP 81 65 56 80 70 72 84  Wt. (Lbs) 230.04        BMI 37.13 kg/m2             Morbid obesity ([place])  Patient re-educated about  the importance of commitment to a  minimum of 150 minutes of exercise per week as able.  The importance of healthy food choices with portion control discussed, as well as eating regularly and within a 12 hour window most days. The need to choose "clean , green" food 50 to 75% of the time is discussed, as well as to make water the primary drink and set a goal of 64 ounces water daily.       12/05/2022    8:09 AM 10/07/2022   11:11 AM 09/30/2022    6:42 AM  Weight /BMI  Weight 230 lb 0.6 oz 239 lb 9.6 oz 233 lb 0.4 oz  Height '5\' 6"'$  (1.676 m)  '5\' 6"'$  (1.676 m)  BMI 37.13 kg/m2 38.67 kg/m2 37.61 kg/m2  Nausea Chronic and unchanged  Moderate episode of recurrent major depressive disorder (HCC) Mildly improved, but still has severe depression, not suicidal or homicidAL, MANAGED by Psych, back at work x 2 weeks  Anxiety Unchanged, managed by Psych, reports increased symptoms now that she is back at work  Anemia, deficiency Check B12 level  Vitamin D deficiency Updated lab needed at/ before next visit.   Insomnia due to other mental disorder Sleep hygiene reviewed .Management per psych

## 2022-12-05 NOTE — Assessment & Plan Note (Signed)
Check B12 level. 

## 2022-12-05 NOTE — Assessment & Plan Note (Signed)
.  copn1 DASH diet and commitment to daily physical activity for a minimum of 30 minutes discussed and encouraged, as a part of hypertension management. The importance of attaining a healthy weight is also discussed.     12/05/2022    8:09 AM 11/08/2022   12:26 PM 10/15/2022    3:19 PM 10/15/2022    2:00 PM 10/15/2022    1:00 PM 10/15/2022   12:00 PM 10/15/2022   11:30 AM  BP/Weight  Systolic BP 914 96 99 445 [phone]  Diastolic BP 81 65 56 80 70 72 84  Wt. (Lbs) 230.04        BMI 37.13 kg/m2

## 2022-12-05 NOTE — Assessment & Plan Note (Signed)
Mildly improved, but still has severe depression, not suicidal or homicidAL, MANAGED by Psych, back at work x 2 weeks

## 2022-12-06 LAB — CMP14+EGFR
ALT: 10 IU/L (ref 0–32)
AST: 10 IU/L (ref 0–40)
Albumin/Globulin Ratio: 1.6 (ref 1.2–2.2)
Albumin: 4.2 g/dL (ref 3.8–4.9)
Alkaline Phosphatase: 38 IU/L — ABNORMAL LOW (ref 44–121)
BUN/Creatinine Ratio: 9 (ref 9–23)
BUN: 13 mg/dL (ref 6–24)
Bilirubin Total: 0.7 mg/dL (ref 0.0–1.2)
CO2: 20 mmol/L (ref 20–29)
Calcium: 9.4 mg/dL (ref 8.7–10.2)
Chloride: 105 mmol/L (ref 96–106)
Creatinine, Ser: 1.49 mg/dL — ABNORMAL HIGH (ref 0.57–1.00)
Globulin, Total: 2.7 g/dL (ref 1.5–4.5)
Glucose: 90 mg/dL (ref 70–99)
Potassium: 4.6 mmol/L (ref 3.5–5.2)
Sodium: 140 mmol/L (ref 134–144)
Total Protein: 6.9 g/dL (ref 6.0–8.5)
eGFR: 41 mL/min/{1.73_m2} — ABNORMAL LOW (ref >59)

## 2022-12-06 LAB — VITAMIN D 25 HYDROXY (VIT D DEFICIENCY, FRACTURES): Vit D, 25-Hydroxy: 45.3 ng/mL (ref 30.0–100.0)

## 2022-12-06 LAB — B12 AND FOLATE PANEL
Folate: 3.1 ng/mL (ref >3.0)
Vitamin B-12: >2000 pg/mL — ABNORMAL HIGH (ref 232–1245)

## 2022-12-09 ENCOUNTER — Ambulatory Visit (INDEPENDENT_AMBULATORY_CARE_PROVIDER_SITE_OTHER): Payer: 59 | Admitting: Psychiatry

## 2022-12-09 DIAGNOSIS — F330 Major depressive disorder, recurrent, mild: Secondary | ICD-10-CM

## 2022-12-09 NOTE — Progress Notes (Signed)
Virtual Visit via Video Note  I connected with Olivia Woodward on 12/09/22 at 9:05 AM EST  by a video enabled telemedicine application and verified that I am speaking with the correct person using two identifiers.  Location: Patient: Home Provider: [place] office    I discussed the limitations of evaluation and management by telemedicine and the availability of in person appointments. The patient expressed understanding and agreed to proceed.   I provided 24 minutes of non-face-to-face time during this encounter.   Alonza Smoker, LCSW    THERAPIST PROGRESS NOTE  Session Time:  Monday 12/09/2022 9:05 AM - 9:29 AM   Participation Level: Active  Behavioral Response: CasualAlertDepressed  Type of Therapy: Individual Therapy  Treatment Goals addressed: Establish therapeutic alliance/will develop treatment plan at next session  ProgressTowards Goals: Will set goals at next session  Interventions: CBT and Supportive  Summary: Olivia Woodward is a 57 y.o. female who is referred for services by PCP Dr. Moshe Cipro due to pt experiencing symptoms of depression and anxiety. She denies any psychiatric hospitalizations. She participated in therapy with [name] for about 2 years. She is a returning pt to this clinician and last was seen in 2020.  She is resuming services as she reports having a lot of stress related to her health issues and worries about her children.  She also reports stress regarding being caretaker for her parents as father has prostate cancer and refuses treatment and her mother is going blind.  Current symptoms include depressed mood, difficulty concentrating, fatigue, thoughts and feelings of hopelessness/worthlessness, irritability, restlessness, sleep difficulty, muscle tension, and worry.   Patient last was seen for the assessment appointment 4 to 5 weeks ago.  Patient reports decreased intensity/frequency/duration of symptoms of depression as reflected  in the PHQ 2 and 9.  Patient reports she has been getting out more and participating in social activities like visiting her sister and attending church.  She also reports medication compliance and reports TMS was helpful.  She continues to report stress especially regarding her children and expresses frustration when they have disagreements.  Patient reports having thoughts of being a bad mother when trying to set and maintain limits with her children.  Therapist and patient agreed to end session early as patient has schedule conflict due to her job.  Suicidal/Homicidal: Nowithout intent/plan  Therapist Response: Reviewed symptoms, administered PHQ 2 and 9, discussed results, praised and reinforced patient's increased behavioral activation/socialization/medication compliance/participation in [place], discussed effects on her mood/thoughts/behavior, discussed stressors, facilitated expression of thoughts and feelings, began to examine patient's interaction with her children as well as her thought patterns   Plan: Return again in 2 weeks.  Diagnosis: MDD (major depressive disorder), recurrent episode, mild (HCC)  Collaboration of Care: Psychiatrist AEB sees psychiatrist [name] for medication management.  Patient/Guardian was advised Release of Information must be obtained prior to any record release in order to collaborate their care with an outside provider. Patient/Guardian was advised if they have not already done so to contact the registration department to sign all necessary forms in order for [place] to release information regarding their care.   Consent: Patient/Guardian gives verbal consent for treatment and assignment of benefits for services provided during this visit. Patient/Guardian expressed understanding and agreed to proceed.   Alonza Smoker, LCSW 12/09/2022

## 2022-12-11 ENCOUNTER — Ambulatory Visit (HOSPITAL_COMMUNITY): Payer: Managed Care, Other (non HMO)

## 2022-12-11 MED ORDER — SPIRONOLACTONE 100 MG PO TABS: 100.0000 mg | ORAL_TABLET | Freq: Every day | ORAL | 1 refills | Status: DC

## 2022-12-11 MED ORDER — RIZATRIPTAN BENZOATE 10 MG PO TBDP: 10.0000 mg | ORAL_TABLET | ORAL | 1 refills | Status: DC | PRN

## 2022-12-11 MED ORDER — TOPIRAMATE 100 MG PO TABS: 100.0000 mg | ORAL_TABLET | Freq: Two times a day (BID) | ORAL | 1 refills | Status: DC

## 2022-12-11 MED ORDER — PROMETHAZINE HCL 25 MG PO TABS: ORAL_TABLET | ORAL | 5 refills | Status: DC

## 2022-12-11 MED ORDER — AMLODIPINE BESYLATE 10 MG PO TABS: 10.0000 mg | ORAL_TABLET | Freq: Every day | ORAL | 1 refills | Status: DC

## 2022-12-11 NOTE — Addendum Note (Signed)
Addended by: Fayrene Helper on: 12/11/2022 09:24 PM   Modules accepted: Orders
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
## 2022-12-18 ENCOUNTER — Telehealth (INDEPENDENT_AMBULATORY_CARE_PROVIDER_SITE_OTHER): Payer: 59 | Admitting: Psychiatry

## 2022-12-18 ENCOUNTER — Encounter: Payer: Self-pay | Admitting: Psychiatry

## 2022-12-18 DIAGNOSIS — G4700 Insomnia, unspecified: Secondary | ICD-10-CM | POA: Diagnosis not present

## 2022-12-18 DIAGNOSIS — F3341 Major depressive disorder, recurrent, in partial remission: Secondary | ICD-10-CM

## 2022-12-18 MED ORDER — CARIPRAZINE HCL 3 MG PO CAPS: 3.0000 mg | ORAL_CAPSULE | Freq: Every day | ORAL | 2 refills | Status: DC

## 2022-12-18 MED ORDER — ESCITALOPRAM OXALATE 20 MG PO TABS: 20.0000 mg | ORAL_TABLET | Freq: Every day | ORAL | 5 refills | Status: DC

## 2022-12-18 MED ORDER — ESZOPICLONE 3 MG PO TABS: 3.0000 mg | ORAL_TABLET | Freq: Every evening | ORAL | 1 refills | Status: DC | PRN

## 2022-12-18 NOTE — Patient Instructions (Signed)
Continue lexapro 20 mg daily Continue Vraylar 3 mg daily  Continue Lunesta 3 mg nightly as needed for insomnia Continue Xanax 0.25 mg 3 times a week as needed for anxiety Referred to evaluation of sleep apnea Next appointment: 3/27 at 10 AM

## 2022-12-23 ENCOUNTER — Telehealth (HOSPITAL_COMMUNITY): Payer: Self-pay | Admitting: Psychiatry

## 2022-12-23 ENCOUNTER — Ambulatory Visit (HOSPITAL_COMMUNITY): Payer: 59 | Admitting: Psychiatry

## 2022-12-23 NOTE — Telephone Encounter (Signed)
Therapist attempted to contact patient via text through caregility platform for scheduled appointment, no response.  Therapist called patient, left message indicating attempt, and requesting patient call office. 

## 2022-12-25 NOTE — Telephone Encounter (Signed)
error 

## 2023-01-06 ENCOUNTER — Other Ambulatory Visit: Payer: Self-pay | Admitting: Psychiatry

## 2023-01-06 ENCOUNTER — Ambulatory Visit (INDEPENDENT_AMBULATORY_CARE_PROVIDER_SITE_OTHER): Payer: 59 | Admitting: Psychiatry

## 2023-01-06 ENCOUNTER — Encounter (HOSPITAL_COMMUNITY): Payer: Self-pay

## 2023-01-06 DIAGNOSIS — F3341 Major depressive disorder, recurrent, in partial remission: Secondary | ICD-10-CM | POA: Diagnosis not present

## 2023-01-06 NOTE — Progress Notes (Signed)
Virtual Visit via Video Note  I connected with Olivia Woodward on 01/06/23 at 9:08 AM EST  by a video enabled telemedicine application and verified that I am speaking with the correct person using two identifiers.  Location: Patient: Home Provider: [place] office    I discussed the limitations of evaluation and management by telemedicine and the availability of in person appointments. The patient expressed understanding and agreed to proceed.   I provided 18 minutes of non-face-to-face time during this encounter.   Alonza Smoker, LCSW     THERAPIST PROGRESS NOTE  Session Time:  Monday 01/06/2023 9:08 AM - 9:26 AM   Participation Level: Active  Behavioral Response: CasualAlert/less depressed   Type of Therapy: Individual Therapy  Treatment Goals addressed: Yulanda will score less than 8 on the Patient Health Questionnaire (PHQ-9) Xara will practice behavioral activation skills 4 times per week for the next 26 weeks   Progress Towards Goals: Initial   Interventions: CBT and Supportive  Summary: Olivia Woodward is a 57 y.o. female who is referred for services by PCP Dr. Moshe Cipro due to pt experiencing symptoms of depression and anxiety. She denies any psychiatric hospitalizations. She participated in therapy with [name] for about 2 years. She is a returning pt to this clinician and last was seen in 2020.  She is resuming services as she reports having a lot of stress related to her health issues and worries about her children.  She also reports stress regarding being caretaker for her parents as father has prostate cancer and refuses treatment and her mother is going blind.  Current symptoms include depressed mood, difficulty concentrating, fatigue, thoughts and feelings of hopelessness/worthlessness, irritability, restlessness, sleep difficulty, muscle tension, and worry.   Patient last was seen about 3 to 4 weeks ago.  Patient states having up and down days.  She  reports increased behavioral activation stating going out more, doing things, and going places such as her daughter's basketball games as well as going shopping.  She still reports having to push herself but reports enjoying her activities.  She reports decreased stress regarding the relationship with her children.  Per patient's report, daughter has been more compliant and less argumentative.  She also expresses increased acceptance of her son's decisions regarding his career.  This has resulted in decreased worry as well as improvement in patient's relationship with her son.  She reports being ready for change regarding jobs.  She is working from home currently but states she realizes this is not for her as she needs to be able to actually go to a job.  She has already begun a job search and has had 1 interview.  She is waiting for a call back from the company regarding a possible second interview.  She expresses some concern regarding changing jobs as she does not want a reduction in pay and also expresses some anxiety about learning a new job.  Suicidal/Homicidal: Nowithout intent/plan  Therapist Response: Reviewed symptoms, praised and reinforced patient's increased behavioral activation, discussed effects, discussed stressors, facilitated expression of thoughts and feelings, validated feelings, praised and reinforced patient's efforts to have more realistic expectations regarding her children, discussed effects, normalized some anxiety regarding possibly starting a new job, developed treatment plan, obtained patient's permission to electronically sign plan for patient as this was a virtual visit, sent patient copy via MyChart Plan: Return again in 2 weeks.  Diagnosis: MDD (major depressive disorder), recurrent, in partial remission ([place])  Collaboration of  Care: Psychiatrist AEB sees psychiatrist [name] for medication management.  Patient/Guardian was advised Release of Information must be  obtained prior to any record release in order to collaborate their care with an outside provider. Patient/Guardian was advised if they have not already done so to contact the registration department to sign all necessary forms in order for [place] to release information regarding their care.   Consent: Patient/Guardian gives verbal consent for treatment and assignment of benefits for services provided during this visit. Patient/Guardian expressed understanding and agreed to proceed.   Active     OP Depression     LTG: Tiawana will score less than 8 on the Patient Health Questionnaire (PHQ-9)      Start:  01/06/23    Expected End:  07/07/23         STG: Marianna Fuss will practice behavioral activation skills 4 times per week for the next 26 weeks     Start:  01/06/23    Expected End:  07/07/23             Alonza Smoker, LCSW 01/06/2023

## 2023-01-22 ENCOUNTER — Ambulatory Visit (INDEPENDENT_AMBULATORY_CARE_PROVIDER_SITE_OTHER): Payer: 59 | Admitting: Psychiatry

## 2023-01-22 DIAGNOSIS — F3341 Major depressive disorder, recurrent, in partial remission: Secondary | ICD-10-CM | POA: Diagnosis not present

## 2023-01-22 NOTE — Progress Notes (Signed)
Virtual Visit via Video Note  I connected with Tateyana Diponio Corado on 01/22/23 at 9:05 AM EST  by a video enabled telemedicine application and verified that I am speaking with the correct person using two identifiers.  Location: Patient: Home Provider: [place] office    I discussed the limitations of evaluation and management by telemedicine and the availability of in person appointments. The patient expressed understanding and agreed to proceed.  I provided 48 minutes of non-face-to-face time during this encounter.   Alonza Smoker, LCSW     THERAPIST PROGRESS NOTE  Session Time:  Wednesday 01/22/2023 9:05 AM - 9:53 AM   Participation Level: Active  Behavioral Response: CasualAlert/less depressed   Type of Therapy: Individual Therapy  Treatment Goals addressed: Sybrina will score less than 8 on the Patient Health Questionnaire (PHQ-9) Olla will practice behavioral activation skills 4 times per week for the next 26 weeks   Progress Towards Goals: Initial   Interventions: CBT and Supportive  Summary: LENORIA SLATTERY is a 57 y.o. female who is referred for services by PCP Dr. Moshe Cipro due to pt experiencing symptoms of depression and anxiety. She denies any psychiatric hospitalizations. She participated in therapy with [name] for about 2 years. She is a returning pt to this clinician and last was seen in 2020.  She is resuming services as she reports having a lot of stress related to her health issues and worries about her children.  She also reports stress regarding being caretaker for her parents as father has prostate cancer and refuses treatment and her mother is going blind.  Current symptoms include depressed mood, difficulty concentrating, fatigue, thoughts and feelings of hopelessness/worthlessness, irritability, restlessness, sleep difficulty, muscle tension, and worry.   Patient last was seen about 2 to 3 weeks ago.  Patient reports decreased intensity,  frequency, and duration of symptoms of depression since last session as reflected in the PHQ 2 and 9.  She reports going out to take care of of responsibilities like going to the grocery store and paying bills.  However, she reports still having to push herself to do this.  She reports enjoying attending a baby shower this past Saturday.  She continues to attend church.  She continues to express frustration regarding working from home as she has minimal socialization and misses leaving her home to work.  She recently interviewed for a job and is waiting for a call back.  However, she expresses ambivalent feelings about the job.    Suicidal/Homicidal: Nowithout intent/plan  Therapist Response: Reviewed symptoms, administered PHQ 2 and 9, discussed results, praised and reinforced patient's continued efforts regarding behavioral activation, discussed effects, praised and reinforced patient's efforts regarding increased socialization by attending baby shower, discussed effects on patient's mood, assisted patient identify connection between mood and behavioral activation/socialization, assisted patient explore ways to increase pleasurable social involvement, developed plan with patient to sign up for the gym and attend an exercise class I time per week, discussed the use of planning to promote and involvement to attend pleasurable events, developed plan with patient to research possible fun activities in [place] and to attend 1 pleasant event per week, facilitated patient expressing thoughts and feelings about current job situation, validated feelings, encouraged patient to follow through with her plans to continue to apply for jobs, discussed possible employers including [facility] health[facility]: Return again in 2 weeks.  Diagnosis: MDD (major depressive disorder), recurrent, in partial remission ([place])  Collaboration of Care: Psychiatrist AEB sees psychiatrist Dr.  Hisada for medication management.  Patient/Guardian  was advised Release of Information must be obtained prior to any record release in order to collaborate their care with an outside provider. Patient/Guardian was advised if they have not already done so to contact the registration department to sign all necessary forms in order for [place] to release information regarding their care.   Consent: Patient/Guardian gives verbal consent for treatment and assignment of benefits for services provided during this visit. Patient/Guardian expressed understanding and agreed to proceed.        Alonza Smoker, LCSW 01/22/2023

## 2023-01-30 ENCOUNTER — Ambulatory Visit (HOSPITAL_COMMUNITY): Payer: Managed Care, Other (non HMO)

## 2023-02-03 ENCOUNTER — Other Ambulatory Visit (HOSPITAL_COMMUNITY): Payer: Self-pay | Admitting: Family Medicine

## 2023-02-03 ENCOUNTER — Other Ambulatory Visit: Payer: Self-pay | Admitting: Family Medicine

## 2023-02-03 DIAGNOSIS — N6320 Unspecified lump in the left breast, unspecified quadrant: Secondary | ICD-10-CM
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
## 2023-02-12 ENCOUNTER — Encounter: Payer: Self-pay | Admitting: Psychiatry

## 2023-02-12 ENCOUNTER — Telehealth (INDEPENDENT_AMBULATORY_CARE_PROVIDER_SITE_OTHER): Payer: 59 | Admitting: Psychiatry

## 2023-02-12 DIAGNOSIS — F3341 Major depressive disorder, recurrent, in partial remission: Secondary | ICD-10-CM

## 2023-02-12 DIAGNOSIS — G4700 Insomnia, unspecified: Secondary | ICD-10-CM

## 2023-02-12 MED ORDER — ESZOPICLONE 3 MG PO TABS: 3.0000 mg | ORAL_TABLET | Freq: Every evening | ORAL | 0 refills | Status: DC | PRN

## 2023-02-12 NOTE — Patient Instructions (Signed)
Continue lexapro 20 mg daily Continue Vraylar 3 mg daily  Continue Lunesta 3 mg nightly as needed for insomnia Continue Xanax 0.25 mg daily as needed for anxiety Referred to evaluation of sleep apnea Next appointment: 5/15 at 2 pm

## 2023-02-20 ENCOUNTER — Encounter (HOSPITAL_COMMUNITY): Payer: Self-pay

## 2023-02-20 ENCOUNTER — Ambulatory Visit (HOSPITAL_COMMUNITY)
Admission: RE | Admit: 2023-02-20 | Discharge: 2023-02-20 | Disposition: A | Discharge status: Home / Self Care | Payer: Managed Care, Other (non HMO) | Source: Ambulatory Visit | Attending: Family Medicine | Admitting: Family Medicine

## 2023-02-20 DIAGNOSIS — N6320 Unspecified lump in the left breast, unspecified quadrant: Secondary | ICD-10-CM | POA: Insufficient documentation

## 2023-03-05 ENCOUNTER — Other Ambulatory Visit: Payer: Self-pay | Admitting: Psychiatry
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
## 2023-04-02 ENCOUNTER — Telehealth (INDEPENDENT_AMBULATORY_CARE_PROVIDER_SITE_OTHER): Payer: 59 | Admitting: Psychiatry

## 2023-04-02 ENCOUNTER — Encounter: Payer: Self-pay | Admitting: Psychiatry

## 2023-04-02 DIAGNOSIS — F3341 Major depressive disorder, recurrent, in partial remission: Secondary | ICD-10-CM

## 2023-04-02 DIAGNOSIS — G4700 Insomnia, unspecified: Secondary | ICD-10-CM

## 2023-04-02 MED ORDER — HYDROXYZINE HCL 25 MG PO TABS: 25.0000 mg | ORAL_TABLET | Freq: Every day | ORAL | 1 refills | Status: DC | PRN

## 2023-04-02 MED ORDER — CARIPRAZINE HCL 3 MG PO CAPS: 3.0000 mg | ORAL_CAPSULE | Freq: Every day | ORAL | 3 refills | Status: DC

## 2023-04-02 MED ORDER — ESZOPICLONE 3 MG PO TABS: 3.0000 mg | ORAL_TABLET | Freq: Every evening | ORAL | 1 refills | Status: DC | PRN

## 2023-05-16 ENCOUNTER — Ambulatory Visit: Payer: Managed Care, Other (non HMO) | Admitting: Urology

## 2023-05-16 DIAGNOSIS — N132 Hydronephrosis with renal and ureteral calculous obstruction: Secondary | ICD-10-CM
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
## 2023-05-27 ENCOUNTER — Ambulatory Visit: Payer: Managed Care, Other (non HMO) | Admitting: Pulmonary Disease

## 2023-05-28 ENCOUNTER — Telehealth (HOSPITAL_COMMUNITY): Payer: Self-pay | Admitting: Psychiatry

## 2023-05-28 ENCOUNTER — Encounter: Payer: Self-pay | Admitting: Psychiatry

## 2023-05-28 ENCOUNTER — Other Ambulatory Visit: Payer: Self-pay | Admitting: Psychiatry

## 2023-05-28 ENCOUNTER — Telehealth (INDEPENDENT_AMBULATORY_CARE_PROVIDER_SITE_OTHER): Payer: 59 | Admitting: Psychiatry

## 2023-05-28 DIAGNOSIS — F331 Major depressive disorder, recurrent, moderate: Secondary | ICD-10-CM

## 2023-05-28 DIAGNOSIS — G4700 Insomnia, unspecified: Secondary | ICD-10-CM | POA: Diagnosis not present

## 2023-05-28 MED ORDER — ESCITALOPRAM OXALATE 20 MG PO TABS: 20.0000 mg | ORAL_TABLET | Freq: Every day | ORAL | 5 refills | Status: DC

## 2023-05-28 MED ORDER — ESZOPICLONE 3 MG PO TABS: 3.0000 mg | ORAL_TABLET | Freq: Every evening | ORAL | 1 refills | Status: DC | PRN

## 2023-05-28 NOTE — Telephone Encounter (Signed)
D:  Dr. Vanetta Shawl referred pt back for TMS.  Pt underwent TMS services in beginning in Sept. 2023, but didn't complete it d/t medical issues/surgery.  According to Dr. Vanetta Shawl, pt wouldn't be able to start until August 2024 d/t work schedule.  A:  Inform Morrie Sheldon ([employer] Coordinator) and [employer] team.

## 2023-05-28 NOTE — Patient Instructions (Signed)
Continue lexapro 20 mg daily Continue Vraylar 3 mg daily  Continue Lunesta 3 mg nightly as needed for insomnia Hold Xanax Referral to TMS She was advised to contact Ms. Bynum for therapy Referred to evaluation of sleep apnea Next appointment: 9/6 at 8 am

## 2023-05-30 NOTE — Telephone Encounter (Signed)
received fax requesting a refill on the hydroxyzine hcl 25mg .  Pt last seen on  7-10 next appt 9-6

## 2023-06-05 ENCOUNTER — Telehealth (HOSPITAL_COMMUNITY): Payer: Self-pay

## 2023-06-05 NOTE — Telephone Encounter (Signed)
TMS C: Placed outgoing call to pt to initiate phone screening for potential 2nd round of TMS treatment. Pt requested the coordinator give a call back on their lunch break for screening. Coordinator agreed and will call back.

## 2023-06-10 ENCOUNTER — Telehealth (HOSPITAL_COMMUNITY): Payer: Self-pay

## 2023-06-10 NOTE — Telephone Encounter (Signed)
TMS C: Returned pt's call to initiate & complete phone screening. Pt requested a call back on their next break (around 3:30p) d/t limited time while at work. TMS C agreed to call back around that time.

## 2023-07-02 ENCOUNTER — Encounter: Payer: Self-pay | Admitting: Family Medicine

## 2023-07-02 ENCOUNTER — Ambulatory Visit: Payer: Managed Care, Other (non HMO) | Admitting: Family Medicine

## 2023-07-02 VITALS — BP 116/82 | HR 86 | Ht 66.0 in | Wt 239.0 lb

## 2023-07-02 DIAGNOSIS — M62838 Other muscle spasm: Secondary | ICD-10-CM

## 2023-07-02 DIAGNOSIS — E041 Nontoxic single thyroid nodule: Secondary | ICD-10-CM

## 2023-07-02 DIAGNOSIS — E559 Vitamin D deficiency, unspecified: Secondary | ICD-10-CM

## 2023-07-02 DIAGNOSIS — E538 Deficiency of other specified B group vitamins: Secondary | ICD-10-CM

## 2023-07-02 DIAGNOSIS — G8929 Other chronic pain: Secondary | ICD-10-CM

## 2023-07-02 DIAGNOSIS — I10 Essential (primary) hypertension: Secondary | ICD-10-CM

## 2023-07-02 DIAGNOSIS — E042 Nontoxic multinodular goiter: Secondary | ICD-10-CM

## 2023-07-02 DIAGNOSIS — D582 Other hemoglobinopathies: Secondary | ICD-10-CM

## 2023-07-02 DIAGNOSIS — Z13220 Encounter for screening for lipoid disorders: Secondary | ICD-10-CM

## 2023-07-02 DIAGNOSIS — R252 Cramp and spasm: Secondary | ICD-10-CM

## 2023-07-02 DIAGNOSIS — E049 Nontoxic goiter, unspecified: Secondary | ICD-10-CM

## 2023-07-02 DIAGNOSIS — Z0000 Encounter for general adult medical examination without abnormal findings: Secondary | ICD-10-CM | POA: Insufficient documentation

## 2023-07-02 DIAGNOSIS — D126 Benign neoplasm of colon, unspecified: Secondary | ICD-10-CM

## 2023-07-02 DIAGNOSIS — Z0001 Encounter for general adult medical examination with abnormal findings: Secondary | ICD-10-CM | POA: Diagnosis not present

## 2023-07-02 DIAGNOSIS — R7989 Other specified abnormal findings of blood chemistry: Secondary | ICD-10-CM

## 2023-07-02 DIAGNOSIS — E0590 Thyrotoxicosis, unspecified without thyrotoxic crisis or storm: Secondary | ICD-10-CM

## 2023-07-02 DIAGNOSIS — M25512 Pain in left shoulder: Secondary | ICD-10-CM | POA: Diagnosis not present

## 2023-07-02 MED ORDER — TOPIRAMATE 100 MG PO TABS: 100.0000 mg | ORAL_TABLET | Freq: Two times a day (BID) | ORAL | 1 refills | Status: DC

## 2023-07-02 MED ORDER — RIZATRIPTAN BENZOATE 10 MG PO TBDP: 10.0000 mg | ORAL_TABLET | ORAL | 1 refills | Status: AC | PRN

## 2023-07-02 NOTE — Assessment & Plan Note (Signed)
Ept comoscopy due 2025

## 2023-07-02 NOTE — Progress Notes (Signed)
    Olivia Woodward     MRN: [id number]      DOB: January 07, 1966  Chief Complaint  Patient presents with   Annual Exam    CPE all over body cramps    HPI: Patient is in for annual physical exam. 3 month cramping all over extremities and trunk , esp lower abdomen duration approx 15 mins on average  2 to 3 episodes per day, pain is up to a 10 has to stop and stretch and shoulders, has tried muscle relaxans, no relief Using mustard  gets short term relief and this Is her go to ,  Labs today will be reviewed. Immunization is reviewed , and  updated if needed.   PE: BP 116/82 (BP Location: Right Arm, Patient Position: Sitting, Cuff Size: Large)   Pulse 86   Ht 5\' 6"  (1.676 m)   Wt 239 lb (108.4 kg)   SpO2 95%   BMI 38.58 kg/m   Pleasant  female, alert and oriented x 3, in no cardio-pulmonary distress. Afebrile. HEENT No facial trauma or asymetry. Sinuses non tender.  Extra occullar muscles intact.. External ears normal, . Neck: supple, no adenopathy,JVD .No bruits.Goiter   Chest: Clear to ascultation bilaterally.No crackles or wheezes. Non tender to palpation   Cardiovascular system; Heart sounds normal,  S1 and  S2 ,no S3.  No murmur, or thrill.  Peripheral pulses normal.  Abdomen: Soft, superficial tenderness in lower abdomen, reports spasms, no organomegaly or masses. No bruits. Bowel sounds normal.  GU: Not examined   Musculoskeletal exam: Full ROM of spine, hips ,  and knees.Decreased in left shoulder No deformity ,swelling or crepitus noted. No muscle wasting or atrophy.   Neurologic: Cranial nerves 2 to 12 intact. Power, tone ,sensation  normal throughout. No disturbance in gait. No tremor.  Skin: Intact, no ulceration, erythema , scaling or rash noted. Pigmentation normal throughout  Psych; Depressed  mood and flat affect. Judgement and concentration normal   Assessment & Plan:  Vitamin D deficiency Corected , start OTC vit D 3 , 1000 units  daily  Tubular adenoma of colon Ept comoscopy due 2025  Abnormal TSH Rept lab in 4 to 6 months  Encounter for annual physical exam Annual exam as documented. Counseling done  re healthy lifestyle involving commitment to 150 minutes exercise per week, heart healthy diet, and attaining healthy weight.The importance of adequate sleep also discussed. Regular seat belt use and home safety, is also discussed. Changes in health habits are decided on by the patient with goals and time frames  set for achieving them. Immunization and cancer screening needs are specifically addressed at this visit.   Goiter Rept [place] to f/u nodules needed  Muscle spasms of both lower extremities 3 monht h/o generalized disabling muscle spasms, Normal CK and ESR, magnesium and potassium, refer Neurology, trial of tonic water  Chronic left shoulder pain Worsening with limitation in mobility in past 3 to 6 monhts, no recent trauma, refer Ortho

## 2023-07-02 NOTE — Patient Instructions (Addendum)
F/U in 5 months, call if you need me sooner  Labs today CBC, B12 level, ESR, creatinine kinase level CK, lipid panel, CMP and EGFR, TSH, magnesium, hBA1C.  As discussed you will be referred to neurology if there is no obvious abnormality in the lab to explain the muscle spasms.  Vitamin D level is within range.  No more weekly prescription vitamin D is being sent in at this time.  Please commit to taking over-the-counter once daily vitamin D3 1000 international units.  You may try small portion of tonic water example 2 ounces once daily to see see if this helps with the spasms.  I do recommend that you resume and continue therapy, for mental health.  The little baby is also going to add quality to your life.   You are referred to Dr. Hilda Lias regarding left shoulder..  Please start doing physical activity for exercise for 3 days/week 20 to 30 minutes each session and try to increase up to 5 days/week over the next 4 to 6 weeks. Thanks for choosing [facility], we consider it a privelige to serve you.

## 2023-07-02 NOTE — Assessment & Plan Note (Signed)
Corected , start OTC vit D 3 , 1000 units daily

## 2023-07-03 LAB — CBC
Hematocrit: 39.6 % (ref 34.0–46.6)
Hemoglobin: 12.9 g/dL (ref 11.1–15.9)
MCH: 29.9 pg (ref 26.6–33.0)
MCHC: 32.6 g/dL (ref 31.5–35.7)
MCV: 92 fL (ref 79–97)
Platelets: 234 10*3/uL (ref 150–450)
RBC: 4.31 x10E6/uL (ref 3.77–5.28)
RDW: 12.5 % (ref 11.7–15.4)
WBC: 4.3 10*3/uL (ref 3.4–10.8)

## 2023-07-03 LAB — LIPID PANEL
Chol/HDL Ratio: 2.5 ratio (ref 0.0–4.4)
Cholesterol, Total: 185 mg/dL (ref 100–199)
HDL: 74 mg/dL (ref >39)
LDL Chol Calc (NIH): 103 mg/dL — ABNORMAL HIGH (ref 0–99)
Triglycerides: 42 mg/dL (ref 0–149)
VLDL Cholesterol Cal: 8 mg/dL (ref 5–40)

## 2023-07-03 LAB — CMP14+EGFR
ALT: 7 IU/L (ref 0–32)
AST: 14 IU/L (ref 0–40)
Albumin: 4.1 g/dL (ref 3.8–4.9)
Alkaline Phosphatase: 39 IU/L — ABNORMAL LOW (ref 44–121)
BUN/Creatinine Ratio: 9 (ref 9–23)
BUN: 12 mg/dL (ref 6–24)
Bilirubin Total: 0.6 mg/dL (ref 0.0–1.2)
CO2: 24 mmol/L (ref 20–29)
Calcium: 9.8 mg/dL (ref 8.7–10.2)
Chloride: 104 mmol/L (ref 96–106)
Creatinine, Ser: 1.34 mg/dL — ABNORMAL HIGH (ref 0.57–1.00)
Globulin, Total: 2.9 g/dL (ref 1.5–4.5)
Glucose: 85 mg/dL (ref 70–99)
Potassium: 4.7 mmol/L (ref 3.5–5.2)
Sodium: 140 mmol/L (ref 134–144)
Total Protein: 7 g/dL (ref 6.0–8.5)
eGFR: 47 mL/min/{1.73_m2} — ABNORMAL LOW (ref >59)

## 2023-07-03 LAB — MAGNESIUM: Magnesium: 2.1 mg/dL (ref 1.6–2.3)

## 2023-07-03 LAB — SEDIMENTATION RATE: Sed Rate: 12 mm/h (ref 0–40)

## 2023-07-03 LAB — TSH: TSH: 5.06 u[IU]/mL — ABNORMAL HIGH (ref 0.450–4.500)

## 2023-07-03 LAB — HEMOGLOBIN A1C
Est. average glucose Bld gHb Est-mCnc: 117 mg/dL
Hgb A1c MFr Bld: 5.7 % — ABNORMAL HIGH (ref 4.8–5.6)

## 2023-07-03 LAB — VITAMIN B12: Vitamin B-12: 357 pg/mL (ref 232–1245)

## 2023-07-03 LAB — CK: Total CK: 100 U/L (ref 32–182)

## 2023-07-04 ENCOUNTER — Other Ambulatory Visit: Payer: Self-pay

## 2023-07-04 DIAGNOSIS — E0590 Thyrotoxicosis, unspecified without thyrotoxic crisis or storm: Secondary | ICD-10-CM

## 2023-07-06 DIAGNOSIS — R7989 Other specified abnormal findings of blood chemistry: Secondary | ICD-10-CM | POA: Insufficient documentation

## 2023-07-06 DIAGNOSIS — G8929 Other chronic pain: Secondary | ICD-10-CM | POA: Insufficient documentation

## 2023-07-06 DIAGNOSIS — M62838 Other muscle spasm: Secondary | ICD-10-CM | POA: Insufficient documentation

## 2023-07-06 NOTE — Assessment & Plan Note (Signed)
Rept [place] to f/u nodules needed

## 2023-07-06 NOTE — Assessment & Plan Note (Signed)
Worsening with limitation in mobility in past 3 to 6 monhts, no recent trauma, refer Ortho

## 2023-07-06 NOTE — Assessment & Plan Note (Signed)

## 2023-07-06 NOTE — Assessment & Plan Note (Signed)
Rept lab in 4 to 6 months

## 2023-07-06 NOTE — Assessment & Plan Note (Signed)
3 monht h/o generalized disabling muscle spasms, Normal CK and ESR, magnesium and potassium, refer Neurology, trial of tonic water

## 2023-07-16 ENCOUNTER — Ambulatory Visit (HOSPITAL_COMMUNITY)
Admission: RE | Admit: 2023-07-16 | Discharge: 2023-07-16 | Disposition: A | Discharge status: Home / Self Care | Payer: Managed Care, Other (non HMO) | Source: Ambulatory Visit | Attending: Family Medicine | Admitting: Family Medicine

## 2023-07-16 ENCOUNTER — Ambulatory Visit (INDEPENDENT_AMBULATORY_CARE_PROVIDER_SITE_OTHER): Payer: Managed Care, Other (non HMO) | Admitting: Orthopaedic Surgery

## 2023-07-16 ENCOUNTER — Ambulatory Visit (INDEPENDENT_AMBULATORY_CARE_PROVIDER_SITE_OTHER): Payer: Managed Care, Other (non HMO)

## 2023-07-16 ENCOUNTER — Encounter: Payer: Self-pay | Admitting: Orthopaedic Surgery

## 2023-07-16 VITALS — BP 121/81 | HR 72 | Ht 66.0 in | Wt 240.0 lb

## 2023-07-16 DIAGNOSIS — G8929 Other chronic pain: Secondary | ICD-10-CM

## 2023-07-16 DIAGNOSIS — E041 Nontoxic single thyroid nodule: Secondary | ICD-10-CM | POA: Insufficient documentation

## 2023-07-16 DIAGNOSIS — E049 Nontoxic goiter, unspecified: Secondary | ICD-10-CM | POA: Diagnosis not present

## 2023-07-16 DIAGNOSIS — M25512 Pain in left shoulder: Secondary | ICD-10-CM

## 2023-07-16 NOTE — Progress Notes (Signed)
Subjective:    Patient ID: Olivia Woodward, female    DOB: [birth year], 57 y.o.   MRN: [id number]  HPI She has left shoulder pain that began about three months ago and has gotten worse.  I saw her in 2020 for this and she got better after injections.  She has no trauma, no swelling, no redness, no paresthesias.  It hurts to roll over on it sleeping.  She has tried Tylenol, Advil, heat, ice with no help.   Review of Systems  Constitutional:  Positive for activity change.  Musculoskeletal:  Positive for arthralgias.  All other systems reviewed and are negative. For Review of Systems, all other systems reviewed and are negative.  The following is a summary of the past history medically, past history surgically, known current medicines, social history and family history.  This information is gathered electronically by the computer from prior information and documentation.  I review this each visit and have found including this information at this point in the chart is beneficial and informative.   Past Medical History:  Diagnosis Date   ANEMIA 07/25/2010   Anxiety    Phreesia 03/18/2021   Depression    Phreesia 03/18/2021   Generalized headaches    GERD (gastroesophageal reflux disease)    Hypertension     Past Surgical History:  Procedure Laterality Date   ABDOMINAL HYSTERECTOMY     fibroids, partial   BREAST REDUCTION SURGERY  2005   CHOLECYSTECTOMY N/A 05/27/2022   Procedure: LAPAROSCOPIC CHOLECYSTECTOMY;  Surgeon: Franky Macho, MD;  Location: [facility];  Service: General;  Laterality: N/A;   COLONOSCOPY WITH PROPOFOL N/A 05/10/2019   Two 5 to 6 mm polyps in the sigmoid colon and in the ascending colon, removed with a   GASTRIC BYPASS  2007   LAPAROSCOPIC NEPHRECTOMY     POLYPECTOMY  05/10/2019   Procedure: POLYPECTOMY;  Surgeon: Corbin Ade, MD;  Location: [facility];  Service: Endoscopy;;  colon   ROBOT ASSISTED LAPAROSCOPIC NEPHRECTOMY Left 09/30/2022   Procedure:  XI ROBOTIC ASSISTED LAPAROSCOPIC NEPHRECTOMY;  Surgeon: Malen Gauze, MD;  Location: [facility];  Service: Urology;  Laterality: Left;    Current Outpatient Medications on File Prior to Visit  Medication Sig Dispense Refill   amLODipine (NORVASC) 10 MG tablet Take 1 tablet (10 mg total) by mouth daily. 90 tablet 1   cariprazine (VRAYLAR) 3 MG capsule Take 1 capsule (3 mg total) by mouth daily. 30 capsule 3   Cyanocobalamin (VITAMIN B12) 1000 MCG TBCR Take 2 tablets by mouth once daily 180 tablet 2   escitalopram (LEXAPRO) 20 MG tablet Take 1 tablet (20 mg total) by mouth daily. 30 tablet 5   Eszopiclone 3 MG TABS Take 1 tablet (3 mg total) by mouth at bedtime as needed. insomnia 30 tablet 1   hydrOXYzine (ATARAX) 25 MG tablet Take 1 tablet (25 mg total) by mouth daily as needed for anxiety. 30 tablet 1   pantoprazole (PROTONIX) 40 MG tablet Take 1 tablet (40 mg total) by mouth daily before breakfast. 30 tablet 3   promethazine (PHENERGAN) 25 MG tablet Take one tablet by mouth once daily , as needed, for nausea 30 tablet 5   rizatriptan (MAXALT-MLT) 10 MG disintegrating tablet Take 1 tablet (10 mg total) by mouth as needed for migraine. May repeat in 2 hours if needed 30 tablet 1   spironolactone (ALDACTONE) 100 MG tablet Take 1 tablet (100 mg total) by mouth daily. 90 tablet 1  topiramate (TOPAMAX) 100 MG tablet Take 1 tablet (100 mg total) by mouth 2 (two) times daily. 180 tablet 1   No current facility-administered medications on file prior to visit.    Social History   Socioeconomic History   Marital status: Single    Spouse name: Not on file   Number of children: 2   Years of education: 12   Highest education level: Not on file  Occupational History   Not on file  Tobacco Use   Smoking status: Never   Smokeless tobacco: Never  Vaping Use   Vaping status: Never Used  Substance and Sexual Activity   Alcohol use: No   Drug use: No   Sexual activity: Not Currently  Other  Topics Concern   Not on file  Social History Narrative   Right handed   Caffeine use: tea/soda twice weekly   Social Determinants of Health   Financial Resource Strain: Not on file  Food Insecurity: No Food Insecurity (09/30/2022)   Hunger Vital Sign    Worried About Running Out of Food in the Last Year: Never true    Ran Out of Food in the Last Year: Never true  Transportation Needs: No Transportation Needs (09/30/2022)   PRAPARE - Administrator, Civil Service (Medical): No    Lack of Transportation (Non-Medical): No  Physical Activity: Not on file  Stress: Not on file  Social Connections: Not on file  Intimate Partner Violence: Not At Risk (09/30/2022)   Humiliation, Afraid, Rape, and Kick questionnaire    Fear of Current or Ex-Partner: No    Emotionally Abused: No    Physically Abused: No    Sexually Abused: No    Family History  Problem Relation Age of Onset   Hypertension Mother    Diabetes Mother    Hypertension Father    Hypertension Sister    Hypertension Sister    Colon cancer Neg Hx     BP 121/81   Pulse 72   Ht 5\' 6"  (1.676 m)   Wt 240 lb (108.9 kg)   BMI 38.74 kg/m   Body mass index is 38.74 kg/m.      Objective:   Physical Exam Vitals and nursing note reviewed. Exam conducted with a chaperone present.  Constitutional:      Appearance: She is well-developed.  HENT:     Head: Normocephalic and atraumatic.  Eyes:     Conjunctiva/sclera: Conjunctivae normal.     Pupils: Pupils are equal, round, and reactive to light.  Cardiovascular:     Rate and Rhythm: Normal rate and regular rhythm.  Pulmonary:     Effort: Pulmonary effort is normal.  Abdominal:     Palpations: Abdomen is soft.  Musculoskeletal:       Arms:     Cervical back: Normal range of motion and neck supple.  Skin:    General: Skin is warm and dry.  Neurological:     Mental Status: She is alert and oriented to person, place, and time.     Cranial Nerves: No  cranial nerve deficit.     Motor: No abnormal muscle tone.     Coordination: Coordination normal.     Deep Tendon Reflexes: Reflexes are normal and symmetric. Reflexes normal.  Psychiatric:        Behavior: Behavior normal.        Thought Content: Thought content normal.        Judgment: Judgment normal.    X-rays were  done of the left shoulder, reported separately.       Assessment & Plan:   Encounter Diagnosis  Name Primary?   Chronic left shoulder pain Yes   PROCEDURE NOTE:  The patient request injection, verbal consent was obtained.  The left shoulder was prepped appropriately after time out was performed.   Sterile technique was observed and injection of 1 cc of DepoMedrol 40mg  with several cc's of plain xylocaine. Anesthesia was provided by ethyl chloride and a 20-gauge needle was used to inject the shoulder area. A posterior approach was used.  The injection was tolerated well.  A band aid dressing was applied.  The patient was advised to apply ice later today and tomorrow to the injection sight as needed.  Return in two weeks.  She may need MRI. We considered possibility of MRI in 2020.  Call if any problem.  Precautions discussed.  Electronically Signed Darreld Mclean, MD 8/28/202410:34 AM
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
## 2023-07-21 ENCOUNTER — Encounter: Payer: Self-pay | Admitting: Family Medicine

## 2023-07-21 NOTE — Addendum Note (Signed)
Addended by: Kamyra Perches on: 07/21/2023 02:20 PM   Modules accepted: Orders

## 2023-07-22 ENCOUNTER — Institutional Professional Consult (permissible substitution): Payer: Managed Care, Other (non HMO) | Admitting: Pulmonary Disease

## 2023-07-22 ENCOUNTER — Telehealth: Payer: Self-pay | Admitting: Family Medicine

## 2023-07-22 NOTE — Telephone Encounter (Signed)
[facility] called left a voicemail 09.03.2024 at 11:44 am. Received an urgent referral on patient. ENT contacted patient offered appointment today but patient decline the appointment for today. The next urgent referral appointment is 10.16.2024 will this be okay to wait this long.

## 2023-07-25 ENCOUNTER — Telehealth (INDEPENDENT_AMBULATORY_CARE_PROVIDER_SITE_OTHER): Payer: 59 | Admitting: Psychiatry

## 2023-07-25 ENCOUNTER — Encounter: Payer: Self-pay | Admitting: Psychiatry

## 2023-07-25 DIAGNOSIS — G4700 Insomnia, unspecified: Secondary | ICD-10-CM

## 2023-07-25 DIAGNOSIS — F331 Major depressive disorder, recurrent, moderate: Secondary | ICD-10-CM | POA: Diagnosis not present

## 2023-07-25 MED ORDER — CARIPRAZINE HCL 3 MG PO CAPS: 3.0000 mg | ORAL_CAPSULE | Freq: Every day | ORAL | 3 refills | Status: DC

## 2023-07-25 MED ORDER — BUSPIRONE HCL 5 MG PO TABS: 5.0000 mg | ORAL_TABLET | Freq: Two times a day (BID) | ORAL | 1 refills | Status: DC

## 2023-07-25 MED ORDER — HYDROXYZINE HCL 25 MG PO TABS: 25.0000 mg | ORAL_TABLET | Freq: Every day | ORAL | 1 refills | Status: DC | PRN

## 2023-07-25 NOTE — Patient Instructions (Signed)
Continue lexapro 20 mg daily Continue Vraylar 3 mg daily  Continue Lunesta 3 mg nightly as needed for insomnia Continue hydroxyzine 25 mg daily as needed for anxiety Next appointment: 11/1 at 9 am

## 2023-07-29 NOTE — Telephone Encounter (Signed)
Called patient to contact office, per nurse patient needs to contact ENT.

## 2023-07-30 ENCOUNTER — Ambulatory Visit: Payer: Managed Care, Other (non HMO) | Admitting: Orthopaedic Surgery

## 2023-07-31 ENCOUNTER — Other Ambulatory Visit: Payer: Self-pay | Admitting: Psychiatry

## 2023-08-04 ENCOUNTER — Telehealth: Payer: Self-pay | Admitting: Psychiatry

## 2023-08-04 NOTE — Telephone Encounter (Signed)
Could you verify if she meant TMS? If so, please provide her with the contact number for the team. The referral has already been placed at [facility], and I believe they have contacted her, though it was pending due to concerns about her work schedule.

## 2023-08-04 NOTE — Telephone Encounter (Signed)
Patient left voice message stating that she is now ready to do the treatment discussed at her last appointment.

## 2023-08-05 ENCOUNTER — Ambulatory Visit (INDEPENDENT_AMBULATORY_CARE_PROVIDER_SITE_OTHER): Payer: Managed Care, Other (non HMO) | Admitting: Orthopaedic Surgery

## 2023-08-05 ENCOUNTER — Encounter: Payer: Self-pay | Admitting: Orthopaedic Surgery

## 2023-08-05 VITALS — BP 137/97 | HR 78 | Ht 66.0 in | Wt 231.0 lb

## 2023-08-05 DIAGNOSIS — G8929 Other chronic pain: Secondary | ICD-10-CM

## 2023-08-05 DIAGNOSIS — M25512 Pain in left shoulder: Secondary | ICD-10-CM

## 2023-08-05 MED ORDER — METHYLPREDNISOLONE ACETATE 40 MG/ML IJ SUSP
40.0000 mg | Freq: Once | INTRAMUSCULAR | Status: AC
Start: 2023-08-05 — End: 2023-08-05
  Administered 2023-08-05: 40 mg via INTRA_ARTICULAR

## 2023-08-05 NOTE — Addendum Note (Signed)
Addended by: Michaele Offer on: 08/05/2023 11:24 AM   Modules accepted: Orders

## 2023-08-05 NOTE — Patient Instructions (Signed)
Central scheduling [phone]

## 2023-08-05 NOTE — Progress Notes (Signed)
My shoulder is hurting more  She has pain in the left shoulder. She has no new trauma.  She has pain with overhead use.  ROM is almost full but painful in the extremes.  PROCEDURE NOTE:  The patient request injection, verbal consent was obtained.  The left shoulder was prepped appropriately after time out was performed.   Sterile technique was observed and injection of 1 cc of DepoMedrol 40mg  with several cc's of plain xylocaine. Anesthesia was provided by ethyl chloride and a 20-gauge needle was used to inject the shoulder area. A posterior approach was used.  The injection was tolerated well.  A band aid dressing was applied.  The patient was advised to apply ice later today and tomorrow to the injection sight as needed.  Encounter Diagnosis  Name Primary?   Chronic left shoulder pain Yes   I will get MRI of the left shoulder.  Return in three weeks.  Call if any problem.  Precautions discussed.  Electronically Signed Darreld Mclean, MD 9/17/20248:12 AM

## 2023-08-05 NOTE — Telephone Encounter (Signed)
Although I believe it will take several weeks, could you advise the patient to discuss this with the TMS team, who will be providing the treatment?

## 2023-08-05 NOTE — Telephone Encounter (Signed)
pt called wants to know how long she should ask for FLMA. she states that her employer wants to know before sending over paperwork.

## 2023-08-07 ENCOUNTER — Telehealth (HOSPITAL_COMMUNITY): Payer: Self-pay

## 2023-08-07 NOTE — Telephone Encounter (Signed)
TMS C: Placed outgoing call to pt after referral from Dr. Vanetta Shawl. No answer, left vm requesting pt call back to continue initial phone screening process. Coordinator explained they tried to call on pt's lunch break; offered pt solution of sending assessments/conducting screening via email to assist with work schedule. Coordinator asked pt provide best email address when they call back.

## 2023-08-08 ENCOUNTER — Other Ambulatory Visit (HOSPITAL_COMMUNITY): Payer: Managed Care, Other (non HMO)

## 2023-08-13 ENCOUNTER — Ambulatory Visit (HOSPITAL_COMMUNITY): Payer: Managed Care, Other (non HMO)

## 2023-08-14 ENCOUNTER — Telehealth (HOSPITAL_COMMUNITY): Payer: Self-pay

## 2023-08-14 NOTE — Telephone Encounter (Signed)
TMS C: Placed outgoing call to pt again to complete TMS phone screening. Pt answered and stated they did have the time to complete screening, but when asked to verify their current insurance information (Sub ID, etc), pt stated they did not have it. Pt requested a call back "later on this evening." Coordinator made sure to specify TMS office hours and thye would not be able to call after 5pm. Pt and Coordinator schedule the call back for 4pm.

## 2023-08-14 NOTE — Telephone Encounter (Signed)
TMS C: Coordinator placed an outgoing call to patient at their requested time of 4pm. No answer, coordinator left vm requesting pt call back.   Pt called back immediately after vm was left. Coordinator conducted phone screening and determined pt was eligible d/t PHQ score of 25. Coordinator explained next steps for pt to call their insurance company and request a verification of benefits. Pt then became upset and stated "I didn't have to do this last time. I'm not doing your job for you." Coordinator explained that it was to keep the pt informed of their current plan details and understood the financial responsibility of treatment. Pt continued to express their frustration and concluded call by stating "I'll call you back in 5 minutes and tell you yes."

## 2023-08-15 ENCOUNTER — Ambulatory Visit (HOSPITAL_COMMUNITY): Payer: Managed Care, Other (non HMO)
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
## 2023-08-18 ENCOUNTER — Telehealth (HOSPITAL_COMMUNITY): Payer: Self-pay

## 2023-08-18 NOTE — Telephone Encounter (Signed)
Appointmemt - Message left for patient on her identifiable voicemail that after discussion of patient's recent hostile tone with TMS coordinator and provider reviewing, we would not be taking patient into TMS services at this time. Option for local [facility] in [place] as an alternative [employer] provider given with their phone number [phone].  Informed patient on message Dr. Vanetta Shawl had been made aware of the decision, due to safety concern TMS coordinator would be providing the service alone in the future.  Also due to concerns of the level of negativity with their interaction, only trying to make sure patient checked with her insurance so she would not have any unforseen charges later once in treatment and telling staff this was her job.  Informed this was not for doing a prior authorization for the services but just to verify her benefits and what her commitment would have been for the services.  Requested patient call this Nurse Manager back if any questions.

## 2023-08-18 NOTE — Telephone Encounter (Signed)
TMS C: Placed outgoing call to pt to see if a verification of benefits had been received. Pt stated they had 'met their deductible and would be covered 100%" through [place]. Coordinator then provided dates/times the provider was available for a consultation. Pt selected Tuesday (08/19/23) at 2pm for their consultation.Coordinator asked if any additional questions/comments/concerns were needing answered at the time of the call, pt declined.

## 2023-08-19 ENCOUNTER — Telehealth: Payer: Self-pay | Admitting: Psychiatry

## 2023-08-19 NOTE — Telephone Encounter (Signed)
Called patient as instructed by provider to inform about the message from Dr Vanetta Shawl patient stated that has an appointment with Roney Marion on with [facility]

## 2023-08-19 NOTE — Telephone Encounter (Signed)
We received a form for short-term disability. Please advise her that we will need to hold off on filling it out, as we are unsure when or where she will receive TMS.  I believe our nurse manager has also left a voicemail for her to contact [facility] to pursue this. Please remind her to reach out to [facility]. Let me know if there are any concerns or questions.

## 2023-08-22 ENCOUNTER — Telehealth (INDEPENDENT_AMBULATORY_CARE_PROVIDER_SITE_OTHER): Payer: 59 | Admitting: Psychiatry

## 2023-08-22 ENCOUNTER — Encounter: Payer: Self-pay | Admitting: Psychiatry

## 2023-08-22 DIAGNOSIS — G4700 Insomnia, unspecified: Secondary | ICD-10-CM

## 2023-08-22 DIAGNOSIS — F331 Major depressive disorder, recurrent, moderate: Secondary | ICD-10-CM | POA: Diagnosis not present

## 2023-08-22 NOTE — Patient Instructions (Signed)
Continue lexapro 20 mg daily Continue buspar 5 mg twice a day Continue Vraylar 3 mg daily  Continue Lunesta 3 mg nightly as needed for insomnia Continue hydroxyzine 25 mg daily as needed for anxiety Referred to TMS She was advised to contact Ms. Bynum for therapy Referred to evaluation of sleep apnea Next appointment: 11/1 at 9 am

## 2023-08-26 ENCOUNTER — Other Ambulatory Visit: Payer: Self-pay | Admitting: Gastroenterology

## 2023-08-26 DIAGNOSIS — K219 Gastro-esophageal reflux disease without esophagitis: Secondary | ICD-10-CM

## 2023-08-26 DIAGNOSIS — R1011 Right upper quadrant pain: Secondary | ICD-10-CM

## 2023-08-27 NOTE — Telephone Encounter (Signed)
Sending in refills. She is due for follow-up. Please arrange.

## 2023-09-03 ENCOUNTER — Institutional Professional Consult (permissible substitution) (INDEPENDENT_AMBULATORY_CARE_PROVIDER_SITE_OTHER): Payer: Managed Care, Other (non HMO) | Admitting: Otolaryngology

## 2023-09-03 NOTE — Progress Notes (Deleted)
ENT CONSULT:  Reason for Consult: thyroid nodules    HPI: Olivia Woodward is an 57 y.o. female with hx multiple thyroid nodules here for initial evaluation with me.  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 Hx of dysphagia  Records Reviewed:  ***    Past Medical History:  Diagnosis Date   ANEMIA 07/25/2010   Anxiety    Phreesia 03/18/2021   Depression    Phreesia 03/18/2021   Generalized headaches    GERD (gastroesophageal reflux disease)    Hypertension     Past Surgical History:  Procedure Laterality Date   ABDOMINAL HYSTERECTOMY     fibroids, partial   BREAST REDUCTION SURGERY  2005   CHOLECYSTECTOMY N/A 05/27/2022   Procedure: LAPAROSCOPIC CHOLECYSTECTOMY;  Surgeon: Franky Macho, MD;  Location: [facility];  Service: General;  Laterality: N/A;   COLONOSCOPY WITH PROPOFOL N/A 05/10/2019   Two 5 to 6 mm polyps in the sigmoid colon and in the ascending colon, removed with a   GASTRIC BYPASS  2007   LAPAROSCOPIC NEPHRECTOMY     POLYPECTOMY  05/10/2019   Procedure: POLYPECTOMY;  Surgeon: Corbin Ade, MD;  Location: [facility];  Service: Endoscopy;;  colon   ROBOT ASSISTED LAPAROSCOPIC NEPHRECTOMY Left 09/30/2022   Procedure: XI ROBOTIC ASSISTED LAPAROSCOPIC NEPHRECTOMY;  Surgeon: Malen Gauze, MD;  Location: [facility];  Service: Urology;  Laterality: Left;    Family History  Problem Relation Age of Onset   Hypertension Mother    Diabetes Mother    Hypertension Father    Hypertension Sister    Hypertension Sister    Colon cancer Neg Hx     Social History:  reports that she has never smoked. She has never used smokeless tobacco. She reports that she does not drink alcohol and does not use drugs.  Allergies:  Allergies  Allergen Reactions   Ketorolac Tromethamine Hives    Lips swelled    Medications: I have reviewed the patient's current medications.  The PMH, PSH, Medications, Allergies, and SH were reviewed and updated.  ROS: Constitutional: Negative for fever,  weight loss and weight gain. Cardiovascular: Negative for chest pain and dyspnea on exertion. Respiratory: Is not experiencing shortness of breath at rest. Gastrointestinal: Negative for nausea and vomiting. Neurological: Negative for headaches. Psychiatric: The patient is not nervous/anxious  There were no vitals taken for this visit.  PHYSICAL EXAM:  Exam: General: Well-developed, well-nourished Communication and Voice: Clear pitch and clarity Respiratory Respiratory effort: Equal inspiration and expiration without stridor Cardiovascular Peripheral Vascular: Warm extremities with equal color/perfusion Eyes: No nystagmus with equal extraocular motion bilaterally Neuro/Psych/Balance: Patient oriented to person, place, and time; Appropriate mood and affect; Gait is intact with no imbalance; Cranial nerves I-XII are intact Head and Face Inspection: Normocephalic and atraumatic without mass or lesion Palpation: Facial skeleton intact without bony stepoffs Salivary Glands: No mass or tenderness Facial Strength: Facial motility symmetric and full bilaterally ENT Pinna: External ear intact and fully developed External canal: Canal is patent with intact skin Tympanic Membrane: Clear and mobile External Nose: No scar or anatomic deformity Internal Nose: Septum is ***. No polyp, or purulence. Mucosal edema and erythema present.  Bilateral inferior turbinate hypertrophy.  Lips, Teeth, and gums: Mucosa and teeth intact and viable TMJ: No pain to palpation with full mobility Oral cavity/oropharynx: No erythema or exudate, no lesions present Nasopharynx: No mass or lesion with intact mucosa Hypopharynx: Intact mucosa without pooling of secretions Larynx Glottic: Full true vocal cord mobility  without lesion or mass Supraglottic: Normal appearing epiglottis and AE folds Interarytenoid Space: No or minimal pachydermia or edema Subglottic Space: Patent without lesion or edema Neck Neck and  Trachea: Midline trachea without mass or lesion Thyroid: No mass or nodularity Lymphatics: No lymphadenopathy  Procedure:      Studies Reviewed:  Thyroid U/S 07/27/2021 Markedly heterogeneous, multinodular goiter as detailed above. Comparison to the patient's prior study in 2011 is difficult, however several of the thyroid nodules are clearly stable as detailed above. 2. There is a 2.4 cm TR 4 thyroid nodule in the right mid thyroid gland (labeled 4). A correlate nodule on the patient's study from 2011 is not definitively identified. As such, fine-needle aspiration is recommended for this thyroid nodule. 3. Apparent new TR 3 thyroid nodule measuring 1.7 cm in the left inferior thyroid gland. A 1 year follow-up ultrasound is recommended for this thyroid nodule.  Thyroid U/S 07/17/23 THYROID ULTRASOUND   TECHNIQUE: Ultrasound examination of the thyroid gland and adjacent soft tissues was performed.   COMPARISON:  Multiple prior thyroid ultrasounds including 07/27/2021 and 09/14/2020   FINDINGS: Parenchymal Echotexture: Moderately heterogenous   Isthmus: 0.2 cm   Right lobe: 4.8 x 1.8 x 2.0 cm   Left lobe: 4.8 x 1.6 x 2.0 cm   _________________________________________________________   Estimated total number of nodules >/= 1 cm: 6-10   Number of spongiform nodules >/=  2 cm not described below (TR1): 0   Number of mixed cystic and solid nodules >/= 1.5 cm not described below (TR2): 0   _________________________________________________________   Nodule #1: Peripherally calcified lesion within the superior aspect of the thyroid isthmus is decreasing in size at 1.3 x 1.1 x 0.8 cm compared to 2.5 x 1.0 x 0.6 cm previously. This likely represents involution of a benign nodule. No further follow-up.   _________________________________________________________   Nodule #3: Hypoechoic solid nodule in the right upper gland is decreasing in size at 1.2 x 1.1 x 0.9 cm  compared to 1.6 x 1.5 x 1.1 cm previously. Involution over time is consistent with a benign process.   _________________________________________________________   Nodule #4: Previously biopsied nodule in the right mid gland remains unchanged at 2.0 x 1.8 x 1.3 cm.   _________________________________________________________   Nodule #7: Peripherally calcified nodule in the superior aspect of the right gland measures 1.2 x 1.1 x 0.8 cm. Due to the density of the peripheral calcifications, cannot determine the echogenicity or solid nature of the nodule. This lesion was not definitively measured on the him last thyroid ultrasound. However, in retrospect it could be seen and measured at least 1.1 cm. Findings are consistent with TI-RADS category 4. *Given size (>/= 1 - 1.4 cm) and appearance, a follow-up ultrasound in 1 year should be considered based on TI-RADS criteria.   _________________________________________________________   Nodule # 8: Isoechoic solid nodule in the right mid gland measures up to 1.4 cm. Findings are consistent with TI-RADS category 3. Given size (<1.4 cm) and appearance, this nodule does NOT meet TI-RADS criteria for biopsy or dedicated follow-up.   _________________________________________________________   Nodule # 9: Isoechoic solid nodule in the left mid gland measures up to 1.1 cm. Findings are consistent with TI-RADS category 3. Given size (<1.4 cm) and appearance, this nodule does NOT meet TI-RADS criteria for biopsy or dedicated follow-up.   _________________________________________________________   Nodule # 10: Isoechoic solid nodule in the left mid gland measures 1.7 x 1.5 x 4.4 cm. Developing peripheral calcification. Findings  are consistent with TI-RADS category 4. **Given size (>/= 1.5 cm) and appearance, fine needle aspiration of this moderately suspicious nodule should be considered based on TI-RADS criteria.    _________________________________________________________   IMPRESSION: 1. Enlarged, heterogeneous and extensively multinodular goiter. Right comparison prior studies is challenging given the multiplicity of the nodules and differences in measurement and imaging technique between studies. 2. Nodule # 10 in the left mid gland is a 1.7 cm TI-RADS category 4 nodule meets criteria for fine-needle aspiration biopsy. Biopsy is recommended. 3. Nodule # 7 in the left upper gland is a 1.2 cm TI-RADS category 4 nodule which was not described on prior study but can be seen in retrospect. This exam confirms 2 years of stability. This lesion continues to meet criteria for imaging surveillance. Recommend follow-up ultrasound in 1 year. 4. Previously biopsied nodule in the right mid gland remains unchanged consistent with prior benign pathologic diagnosis. 5. Additional nodules described above are either involuting consistent with benignity, or otherwise do not meet criteria for biopsy or dedicated follow-up.   FNA of right thyroid nodule Clinical History: Location: Right; Mid, Maximum Size: 2.4 cm; Other 2  dimensions: 2.2 x 1.8 cm, solid/almost completely solid (2), Isoechoic  (1), ACR TI-RADS Total Points 5.  Specimen Submitted:  A. THYROID, RIGHT LOBE, FINE NEEDLE ASPIRATION:    FINAL MICROSCOPIC DIAGNOSIS:  - Consistent with benign follicular nodule (Bethesda category II)   Assessment/Plan: No diagnosis found.  ***  Thank you for allowing me to participate in the care of this patient. Please do not hesitate to contact me with any questions or concerns.   Ashok Croon, MD Otolaryngology [facility] Phone: [phone] Fax: [phone]    09/03/2023, 9:48 AM

## 2023-09-03 NOTE — Progress Notes (Deleted)
ENT CONSULT:  Reason for Consult: thyroid nodules    HPI: Olivia Woodward is an 57 y.o. female with hx   Records Reviewed:  ***    Past Medical History:  Diagnosis Date   ANEMIA 07/25/2010   Anxiety    Phreesia 03/18/2021   Depression    Phreesia 03/18/2021   Generalized headaches    GERD (gastroesophageal reflux disease)    Hypertension     Past Surgical History:  Procedure Laterality Date   ABDOMINAL HYSTERECTOMY     fibroids, partial   BREAST REDUCTION SURGERY  2005   CHOLECYSTECTOMY N/A 05/27/2022   Procedure: LAPAROSCOPIC CHOLECYSTECTOMY;  Surgeon: Franky Macho, MD;  Location: [facility];  Service: General;  Laterality: N/A;   COLONOSCOPY WITH PROPOFOL N/A 05/10/2019   Two 5 to 6 mm polyps in the sigmoid colon and in the ascending colon, removed with a   GASTRIC BYPASS  2007   LAPAROSCOPIC NEPHRECTOMY     POLYPECTOMY  05/10/2019   Procedure: POLYPECTOMY;  Surgeon: Corbin Ade, MD;  Location: [facility];  Service: Endoscopy;;  colon   ROBOT ASSISTED LAPAROSCOPIC NEPHRECTOMY Left 09/30/2022   Procedure: XI ROBOTIC ASSISTED LAPAROSCOPIC NEPHRECTOMY;  Surgeon: Malen Gauze, MD;  Location: [facility];  Service: Urology;  Laterality: Left;    Family History  Problem Relation Age of Onset   Hypertension Mother    Diabetes Mother    Hypertension Father    Hypertension Sister    Hypertension Sister    Colon cancer Neg Hx     Social History:  reports that she has never smoked. She has never used smokeless tobacco. She reports that she does not drink alcohol and does not use drugs.  Allergies:  Allergies  Allergen Reactions   Ketorolac Tromethamine Hives    Lips swelled    Medications: I have reviewed the patient's current medications.  The PMH, PSH, Medications, Allergies, and SH were reviewed and updated.  ROS: Constitutional: Negative for fever, weight loss and weight gain. Cardiovascular: Negative for chest pain and dyspnea on  exertion. Respiratory: Is not experiencing shortness of breath at rest. Gastrointestinal: Negative for nausea and vomiting. Neurological: Negative for headaches. Psychiatric: The patient is not nervous/anxious  There were no vitals taken for this visit.  PHYSICAL EXAM:  Exam: General: Well-developed, well-nourished Communication and Voice: Clear pitch and clarity Respiratory Respiratory effort: Equal inspiration and expiration without stridor Cardiovascular Peripheral Vascular: Warm extremities with equal color/perfusion Eyes: No nystagmus with equal extraocular motion bilaterally Neuro/Psych/Balance: Patient oriented to person, place, and time; Appropriate mood and affect; Gait is intact with no imbalance; Cranial nerves I-XII are intact Head and Face Inspection: Normocephalic and atraumatic without mass or lesion Palpation: Facial skeleton intact without bony stepoffs Salivary Glands: No mass or tenderness Facial Strength: Facial motility symmetric and full bilaterally ENT Pinna: External ear intact and fully developed External canal: Canal is patent with intact skin Tympanic Membrane: Clear and mobile External Nose: No scar or anatomic deformity Internal Nose: Septum is ***. No polyp, or purulence. Mucosal edema and erythema present.  Bilateral inferior turbinate hypertrophy.  Lips, Teeth, and gums: Mucosa and teeth intact and viable TMJ: No pain to palpation with full mobility Oral cavity/oropharynx: No erythema or exudate, no lesions present Nasopharynx: No mass or lesion with intact mucosa Hypopharynx: Intact mucosa without pooling of secretions Larynx Glottic: Full true vocal cord mobility without lesion or mass Supraglottic: Normal appearing epiglottis and AE folds Interarytenoid Space: No or minimal pachydermia or  edema Subglottic Space: Patent without lesion or edema Neck Neck and Trachea: Midline trachea without mass or lesion Thyroid: No mass or  nodularity Lymphatics: No lymphadenopathy  Procedure:      Studies Reviewed:***  Assessment/Plan: No diagnosis found.  ***  Thank you for allowing me to participate in the care of this patient. Please do not hesitate to contact me with any questions or concerns.   Ashok Croon, MD Otolaryngology [facility] Phone: [phone] Fax: [phone]    09/03/2023, 9:31 AM

## 2023-09-04 ENCOUNTER — Encounter (INDEPENDENT_AMBULATORY_CARE_PROVIDER_SITE_OTHER): Payer: Self-pay | Admitting: Otolaryngology

## 2023-09-04 ENCOUNTER — Ambulatory Visit (INDEPENDENT_AMBULATORY_CARE_PROVIDER_SITE_OTHER): Payer: Managed Care, Other (non HMO) | Admitting: Otolaryngology

## 2023-09-04 ENCOUNTER — Telehealth: Payer: Self-pay

## 2023-09-04 VITALS — BP 143/82 | HR 73 | Ht 66.0 in | Wt 240.6 lb

## 2023-09-04 DIAGNOSIS — K219 Gastro-esophageal reflux disease without esophagitis: Secondary | ICD-10-CM | POA: Diagnosis not present

## 2023-09-04 DIAGNOSIS — E042 Nontoxic multinodular goiter: Secondary | ICD-10-CM

## 2023-09-04 DIAGNOSIS — E041 Nontoxic single thyroid nodule: Secondary | ICD-10-CM

## 2023-09-04 NOTE — Telephone Encounter (Signed)
Patient called about her short term disability and FMLA paperwork she stated that the short term needs office notes from September and October and for the [facility] all the sheets needs to be signed and sections A&B needs to be filled out.

## 2023-09-04 NOTE — Patient Instructions (Addendum)
-   see Endocrinology Dr Fransico Him for f/u due to elevated TSH  - schedule biopsy of the left thyroid nodule - try reflux gourmet for reflux  - start Flonase for nasal congestion   - Take Reflux Gourmet (natural supplement available on [employer]) to help with symptoms of chronic throat irritation

## 2023-09-04 NOTE — Telephone Encounter (Signed)
I reviewed the recent paperwork we submitted and noticed that I do not see sections A and B; I only see sections 1-8. Could you ask to clarify which form she is referring to? Also, please go ahead and send progress notes to them, as they were supposed to be included with the form when the paperwork was faxed.

## 2023-09-04 NOTE — Progress Notes (Signed)
ENT CONSULT:  Reason for Consult: thyroid nodules    HPI: Olivia Woodward is an 57 y.o. female with hx multiple thyroid nodules here for initial evaluation with me.   She reports hx of thyroid nodule bx in the past, which was benign (done approximately 2 yrs ago for right thyroid nodule). Hx of RAI in the past (3 yrs ago) for hyperthyroidism and history of toxic multinodular goiter. She denies any recent changes in voice or swallowing.  Recent thyroid ultrasound demonstrated left thyroid nodule requiring biopsy.  Record review indicates that she was previously seen by Dr. Fransico Him endocrinology last office visit was in 2023.  Recent labs with slightly elevated TSH.  She is not on Synthroid or other hormone supplementation for thyroid function.     Hx of dysphagia  Records Reviewed:  Endocrinology Dr Fransico Him, Olivia Woodward is 57 y.o. female who presents today with a medical history as above. 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  She is status post RAI thyroid ablation on June 20, 2019.  Her subsequent labs which are consistent with treatment effect, without hypothyroidism.    1. Toxic multinodular goiter 2. Subclinical hyperthyroidism-resolved She is status post RAI ablation of toxic thyroid nodule on the left lobe on June 19, 2021.  Her previsit thyroid function tests remain consistent with euthyroid state.  She will not need initiation of thyroid hormone for now.   She is made aware of the need for continued monitoring of thyroid function at least every 6 months for now to not miss a chance to initiate thyroid hormone replacement on time.   She has history of benign FNA from one of the right sided nodules in 2021.  Her most recent thyroid ultrasound confirmed shrinking in the size of her thyroid generally, and did not reveal any new concerning thyroid nodules.  She  will not need surgical intervention at this time.     - she is advised to maintain close follow up with Miliyah Perches, MD for primary care needs.     Past Medical History:  Diagnosis Date   ANEMIA 07/25/2010   Anxiety    Phreesia 03/18/2021   Depression    Phreesia 03/18/2021   Generalized headaches    GERD (gastroesophageal reflux disease)    Hypertension     Past Surgical History:  Procedure Laterality Date   ABDOMINAL HYSTERECTOMY     fibroids, partial   BREAST REDUCTION SURGERY  2005   CHOLECYSTECTOMY N/A 05/27/2022   Procedure: LAPAROSCOPIC CHOLECYSTECTOMY;  Surgeon: Franky Macho, MD;  Location: [facility];  Service: General;  Laterality: N/A;   COLONOSCOPY WITH PROPOFOL N/A 05/10/2019   Two 5 to 6 mm polyps in the sigmoid colon and in the ascending colon, removed with a   GASTRIC BYPASS  2007   LAPAROSCOPIC NEPHRECTOMY     POLYPECTOMY  05/10/2019   Procedure: POLYPECTOMY;  Surgeon: Corbin Ade, MD;  Location: [facility];  Service: Endoscopy;;  colon   ROBOT ASSISTED LAPAROSCOPIC NEPHRECTOMY Left 09/30/2022   Procedure: XI ROBOTIC ASSISTED LAPAROSCOPIC NEPHRECTOMY;  Surgeon: Malen Gauze, MD;  Location: [facility];  Service: Urology;  Laterality: Left;    Family History  Problem Relation Age of Onset   Hypertension Mother    Diabetes Mother    Hypertension Father    Hypertension Sister    Hypertension Sister    Colon  cancer Neg Hx     Social History:  reports that she has never smoked. She has never used smokeless tobacco. She reports that she does not drink alcohol and does not use drugs.  Allergies:  Allergies  Allergen Reactions   Ketorolac Tromethamine Hives    Lips swelled    Medications: I have reviewed the patient's current medications.  The PMH, PSH, Medications, Allergies, and SH were reviewed and updated.  ROS: Constitutional: Negative for fever, weight loss and weight gain. Cardiovascular: Negative for chest pain and dyspnea on  exertion. Respiratory: Is not experiencing shortness of breath at rest. Gastrointestinal: Negative for nausea and vomiting. Neurological: Negative for headaches. Psychiatric: The patient is not nervous/anxious  Blood pressure (!) 143/82, pulse 73, height 5\' 6"  (1.676 m), weight 240 lb 9.6 oz (109.1 kg), SpO2 98%.  PHYSICAL EXAM:  Exam: General: Well-developed, well-nourished Communication and Voice: Clear pitch and clarity Respiratory Respiratory effort: Equal inspiration and expiration without stridor Cardiovascular Peripheral Vascular: Warm extremities with equal color/perfusion Eyes: No nystagmus with equal extraocular motion bilaterally Neuro/Psych/Balance: Patient oriented to person, place, and time; Appropriate mood and affect; Gait is intact with no imbalance; Cranial nerves I-XII are intact Head and Face Inspection: Normocephalic and atraumatic without mass or lesion Palpation: Facial skeleton intact without bony stepoffs Salivary Glands: No mass or tenderness Facial Strength: Facial motility symmetric and full bilaterally ENT Pinna: External ear intact and fully developed External canal: Canal is patent with intact skin Tympanic Membrane: Clear and mobile External Nose: No scar or anatomic deformity Internal Nose: Septum is deviated to the left. No polyp, or purulence. Mucosal edema and erythema present.  Bilateral inferior turbinate hypertrophy.  Lips, Teeth, and gums: Mucosa and teeth intact and viable TMJ: No pain to palpation with full mobility Oral cavity/oropharynx: No erythema or exudate, no lesions present Nasopharynx: No mass or lesion with intact mucosa Hypopharynx: Intact mucosa without pooling of secretions Larynx Glottic: Full true vocal cord mobility without lesion or mass Supraglottic: Normal appearing epiglottis and AE folds Interarytenoid Space: Moderate pachydermia edema Subglottic Space: Patent without lesion or edema Neck Neck and Trachea:  Midline trachea without mass or lesion Thyroid: No mass or nodularity Lymphatics: No lymphadenopathy  Procedure: Preoperative diagnosis: Multinodular thyroid goiter history of RAI  Postoperative diagnosis:   Same + GERD LPR  Procedure: Flexible fiberoptic laryngoscopy  Surgeon: Ashok Croon, MD  Anesthesia: Topical lidocaine and Afrin Complications: None Condition is stable throughout exam  Indications and consent:  The patient presents to the clinic with Indirect laryngoscopy view was incomplete. Thus it was recommended that they undergo a flexible fiberoptic laryngoscopy. All of the risks, benefits, and potential complications were reviewed with the patient preoperatively and verbal informed consent was obtained.  Procedure: The patient was seated upright in the clinic. Topical lidocaine and Afrin were applied to the nasal cavity. After adequate anesthesia had occurred, I then proceeded to pass the flexible telescope into the nasal cavity. The nasal cavity was patent without rhinorrhea or polyp. The nasopharynx was also patent without mass or lesion. The base of tongue was visualized and was normal. There were no signs of pooling of secretions in the piriform sinuses. The true vocal folds were mobile bilaterally. There were no signs of glottic or supraglottic mucosal lesion or mass. There was moderate interarytenoid pachydermia and post cricoid edema. The telescope was then slowly withdrawn and the patient tolerated the procedure throughout.       Studies Reviewed:  Thyroid U/S 07/27/2021 Markedly heterogeneous,  multinodular goiter as detailed above. Comparison to the patient's prior study in 2011 is difficult, however several of the thyroid nodules are clearly stable as detailed above. 2. There is a 2.4 cm TR 4 thyroid nodule in the right mid thyroid gland (labeled 4). A correlate nodule on the patient's study from 2011 is not definitively identified. As such, fine-needle  aspiration is recommended for this thyroid nodule. 3. Apparent new TR 3 thyroid nodule measuring 1.7 cm in the left inferior thyroid gland. A 1 year follow-up ultrasound is recommended for this thyroid nodule.  Thyroid U/S 07/17/23 THYROID ULTRASOUND   TECHNIQUE: Ultrasound examination of the thyroid gland and adjacent soft tissues was performed.   COMPARISON:  Multiple prior thyroid ultrasounds including 07/27/2021 and 09/14/2020   FINDINGS: Parenchymal Echotexture: Moderately heterogenous   Isthmus: 0.2 cm   Right lobe: 4.8 x 1.8 x 2.0 cm   Left lobe: 4.8 x 1.6 x 2.0 cm   _________________________________________________________   Estimated total number of nodules >/= 1 cm: 6-10   Number of spongiform nodules >/=  2 cm not described below (TR1): 0   Number of mixed cystic and solid nodules >/= 1.5 cm not described below (TR2): 0   _________________________________________________________   Nodule #1: Peripherally calcified lesion within the superior aspect of the thyroid isthmus is decreasing in size at 1.3 x 1.1 x 0.8 cm compared to 2.5 x 1.0 x 0.6 cm previously. This likely represents involution of a benign nodule. No further follow-up.   _________________________________________________________   Nodule #3: Hypoechoic solid nodule in the right upper gland is decreasing in size at 1.2 x 1.1 x 0.9 cm compared to 1.6 x 1.5 x 1.1 cm previously. Involution over time is consistent with a benign process.   _________________________________________________________   Nodule #4: Previously biopsied nodule in the right mid gland remains unchanged at 2.0 x 1.8 x 1.3 cm.   _________________________________________________________   Nodule #7: Peripherally calcified nodule in the superior aspect of the right gland measures 1.2 x 1.1 x 0.8 cm. Due to the density of the peripheral calcifications, cannot determine the echogenicity or solid nature of the nodule. This  lesion was not definitively measured on the him last thyroid ultrasound. However, in retrospect it could be seen and measured at least 1.1 cm. Findings are consistent with TI-RADS category 4. *Given size (>/= 1 - 1.4 cm) and appearance, a follow-up ultrasound in 1 year should be considered based on TI-RADS criteria.   _________________________________________________________   Nodule # 8: Isoechoic solid nodule in the right mid gland measures up to 1.4 cm. Findings are consistent with TI-RADS category 3. Given size (<1.4 cm) and appearance, this nodule does NOT meet TI-RADS criteria for biopsy or dedicated follow-up.   _________________________________________________________   Nodule # 9: Isoechoic solid nodule in the left mid gland measures up to 1.1 cm. Findings are consistent with TI-RADS category 3. Given size (<1.4 cm) and appearance, this nodule does NOT meet TI-RADS criteria for biopsy or dedicated follow-up.   _________________________________________________________   Nodule # 10: Isoechoic solid nodule in the left mid gland measures 1.7 x 1.5 x 4.4 cm. Developing peripheral calcification. Findings are consistent with TI-RADS category 4. **Given size (>/= 1.5 cm) and appearance, fine needle aspiration of this moderately suspicious nodule should be considered based on TI-RADS criteria.   _________________________________________________________   IMPRESSION: 1. Enlarged, heterogeneous and extensively multinodular goiter. Right comparison prior studies is challenging given the multiplicity of the nodules and differences in measurement and imaging  technique between studies. 2. Nodule # 10 in the left mid gland is a 1.7 cm TI-RADS category 4 nodule meets criteria for fine-needle aspiration biopsy. Biopsy is recommended. 3. Nodule # 7 in the left upper gland is a 1.2 cm TI-RADS category 4 nodule which was not described on prior study but can be seen in retrospect. This  exam confirms 2 years of stability. This lesion continues to meet criteria for imaging surveillance. Recommend follow-up ultrasound in 1 year. 4. Previously biopsied nodule in the right mid gland remains unchanged consistent with prior benign pathologic diagnosis. 5. Additional nodules described above are either involuting consistent with benignity, or otherwise do not meet criteria for biopsy or dedicated follow-up.   FNA of right thyroid nodule  Clinical History: Location: Right; Mid, Maximum Size: 2.4 cm; Other 2  dimensions: 2.2 x 1.8 cm, solid/almost completely solid (2), Isoechoic  (1), ACR TI-RADS Total Points 5.  Specimen Submitted:  A. THYROID, RIGHT LOBE, FINE NEEDLE ASPIRATION:  FINAL MICROSCOPIC DIAGNOSIS:  - Consistent with benign follicular nodule ([employer] category II)   Labs: TSH high - 5.06 07/02/23  Assessment/Plan: Encounter Diagnoses  Name Primary?   Thyroid nodule Yes   Multinodular goiter    Gastroesophageal reflux disease without esophagitis     History of toxic multinodular goiter status post RAI, multiple thyroid nodules with previously benign biopsy of the right thyroid nodule, now with new left thyroid lobe nodule 1.7 cm TI-RADS 4.  -Left thyroid nodule FNA ordered -Flexible laryngoscopy today with normal bilateral vocal fold movement and evidence of GERD LPR  2.  Elevated TSH on recent labs in August, of hyperthyroidism status post RAI -We discussed the need to schedule an appointment with her endocrinologist Dr. Fransico Him  3.  GERD LPR -Continue Protonix 40 mg daily -Diet and lifestyle changes to minimize reflux -Consider trial of reflux Gourmet  RTC after FNA  Thank you for allowing me to participate in the care of this patient. Please do not hesitate to contact me with any questions or concerns.   Ashok Croon, MD Otolaryngology [facility] Phone: [phone] Fax: [phone]    09/04/2023, 7:47 PM

## 2023-09-05 ENCOUNTER — Other Ambulatory Visit (HOSPITAL_COMMUNITY)
Admission: RE | Admit: 2023-09-05 | Discharge: 2023-09-05 | Disposition: A | Discharge status: Home / Self Care | Payer: Managed Care, Other (non HMO) | Source: Ambulatory Visit | Attending: Interventional Radiology | Admitting: Interventional Radiology

## 2023-09-05 ENCOUNTER — Ambulatory Visit
Admission: RE | Admit: 2023-09-05 | Discharge: 2023-09-05 | Disposition: A | Discharge status: Home / Self Care | Payer: Managed Care, Other (non HMO) | Source: Ambulatory Visit | Attending: Otolaryngology | Admitting: Otolaryngology

## 2023-09-05 ENCOUNTER — Other Ambulatory Visit: Payer: Self-pay | Admitting: "Endocrinology

## 2023-09-05 ENCOUNTER — Ambulatory Visit (INDEPENDENT_AMBULATORY_CARE_PROVIDER_SITE_OTHER): Payer: 59 | Admitting: Psychiatry

## 2023-09-05 DIAGNOSIS — F331 Major depressive disorder, recurrent, moderate: Secondary | ICD-10-CM | POA: Diagnosis not present

## 2023-09-05 DIAGNOSIS — E042 Nontoxic multinodular goiter: Secondary | ICD-10-CM

## 2023-09-05 DIAGNOSIS — E041 Nontoxic single thyroid nodule: Secondary | ICD-10-CM

## 2023-09-05 DIAGNOSIS — E0520 Thyrotoxicosis with toxic multinodular goiter without thyrotoxic crisis or storm: Secondary | ICD-10-CM

## 2023-09-05 NOTE — Progress Notes (Signed)
Virtual Visit via Video Note  I connected with Olivia Woodward on 09/05/23 at 10:10 AM EDT by a video enabled telemedicine application and verified that I am speaking with the correct person using two identifiers.  Location: Patient: Home Provider: [facility] office    I discussed the limitations of evaluation and management by telemedicine and the availability of in person appointments. The patient expressed understanding and agreed to proceed.    I provided 45 minutes of non-face-to-face time during this encounter.   Adah Salvage, LCSW     THERAPIST PROGRESS NOTE  Session Time:  Friday  09/05/2023 10:10 AM - 10:55 AM   Participation Level: Active  Behavioral Response: CasualAlert/less depressed   Type of Therapy: Individual Therapy  Treatment Goals addressed: Aritza will score less than 8 on the Patient Health Questionnaire (PHQ-9) Dajanae will practice behavioral activation skills 4 times per week for the next 26 weeks   Progress Towards Goals: Initial   Interventions: CBT and Supportive  Summary: Olivia Woodward is a 57 y.o. female who is referred for services by PCP Dr. Lodema Hong due to pt experiencing symptoms of depression and anxiety. She denies any psychiatric hospitalizations. She participated in therapy with Dr. Evelene Croon for about 2 years. She is a returning pt to this clinician and last was seen in 2020.  She is resuming services as she reports having a lot of stress related to her health issues and worries about her children.  She also reports stress regarding being caretaker for her parents as father has prostate cancer and refuses treatment and her mother is going blind.  Current symptoms include depressed mood, difficulty concentrating, fatigue, thoughts and feelings of hopelessness/worthlessness, irritability, restlessness, sleep difficulty, muscle tension, and worry.   Patient last was seen about 7 months ago.  Patient is resuming services today due to  increased symptoms of depression.  Patient reports extreme fatigue, depressed mood, poor concentration, and poor motivation.  Per her report symptoms began to become more intense about 3 months ago.  She cannot identify any specific trigger.  However she reports stress regarding relationship with her mother who calls patient incessantly to do errands and take care of  issues for mother.  Patient reports difficulty saying no to mother as mother has made comments patient is lazy when she does not comply with mother's request.  She expresses frustration as mother does not understand depression.  Patient also expresses frustration as she has 2 other siblings but her mother does not call them what she calls patient.  Patient also reports difficulty sitting limits with mother as she does not want to be disrespectful or disobedient mother reports.   Suicidal/Homicidal: Nowithout intent/plan  Therapist Response: Reviewed symptoms, administered PHQ 2 and 9, discussed results, discussed stressors, facilitated expression of thoughts and feelings, validated feelings, assisted patient began to examine her pattern of interaction with mother, began to assist patient identify ways to improve assertiveness skills, will send patient basic personal rights to identify statements to help promote more effective assertion, will discuss more at next session   Plan: Return again in 2 weeks.  Diagnosis: Moderate episode of recurrent major depressive disorder (HCC)  Collaboration of Care: Psychiatrist AEB sees psychiatrist Dr. Vanetta Shawl for medication management.  Patient/Guardian was advised Release of Information must be obtained prior to any record release in order to collaborate their care with an outside provider. Patient/Guardian was advised if they have not already done so to contact the registration department to sign  all necessary forms in order for [place] to release information regarding their care.   Consent: Patient/Guardian  gives verbal consent for treatment and assignment of benefits for services provided during this visit. Patient/Guardian expressed understanding and agreed to proceed.        Adah Salvage, LCSW 09/05/2023

## 2023-09-08 ENCOUNTER — Encounter (INDEPENDENT_AMBULATORY_CARE_PROVIDER_SITE_OTHER): Payer: Self-pay

## 2023-09-08 DIAGNOSIS — F332 Major depressive disorder, recurrent severe without psychotic features: Secondary | ICD-10-CM | POA: Diagnosis not present

## 2023-09-08 LAB — CYTOLOGY - NON PAP

## 2023-09-08 NOTE — Telephone Encounter (Signed)
Could you ask them to send the form to [place]?

## 2023-09-11 ENCOUNTER — Other Ambulatory Visit (HOSPITAL_COMMUNITY)
Admission: RE | Admit: 2023-09-11 | Discharge: 2023-09-11 | Disposition: A | Discharge status: Home / Self Care | Payer: Managed Care, Other (non HMO) | Source: Ambulatory Visit | Attending: "Endocrinology | Admitting: "Endocrinology

## 2023-09-11 DIAGNOSIS — E0520 Thyrotoxicosis with toxic multinodular goiter without thyrotoxic crisis or storm: Secondary | ICD-10-CM | POA: Diagnosis present

## 2023-09-11 LAB — T4, FREE: Free T4: 0.73 ng/dL (ref 0.61–1.12)

## 2023-09-11 LAB — LIPID PANEL
Cholesterol: 191 mg/dL (ref 0–200)
HDL: 74 mg/dL (ref >40)
LDL Cholesterol: 112 mg/dL — ABNORMAL HIGH (ref 0–99)
Total CHOL/HDL Ratio: 2.6 {ratio}
Triglycerides: 27 mg/dL (ref <150)
VLDL: 5 mg/dL (ref 0–40)

## 2023-09-11 LAB — TSH: TSH: 4.259 u[IU]/mL (ref 0.350–4.500)

## 2023-09-12 ENCOUNTER — Ambulatory Visit (INDEPENDENT_AMBULATORY_CARE_PROVIDER_SITE_OTHER): Payer: Managed Care, Other (non HMO) | Admitting: "Endocrinology

## 2023-09-12 ENCOUNTER — Encounter: Payer: Self-pay | Admitting: "Endocrinology

## 2023-09-12 ENCOUNTER — Encounter (HOSPITAL_COMMUNITY): Payer: Self-pay | Admitting: Hematology

## 2023-09-12 ENCOUNTER — Telehealth: Payer: Self-pay

## 2023-09-12 VITALS — BP 138/86 | HR 76 | Ht 66.0 in | Wt 243.4 lb

## 2023-09-12 DIAGNOSIS — E782 Mixed hyperlipidemia: Secondary | ICD-10-CM

## 2023-09-12 DIAGNOSIS — I10 Essential (primary) hypertension: Secondary | ICD-10-CM

## 2023-09-12 DIAGNOSIS — E0520 Thyrotoxicosis with toxic multinodular goiter without thyrotoxic crisis or storm: Secondary | ICD-10-CM

## 2023-09-12 LAB — THYROGLOBULIN ANTIBODY: Thyroglobulin Antibody: <1 [IU]/mL (ref 0.0–0.9)

## 2023-09-12 LAB — THYROID PEROXIDASE ANTIBODY: Thyroperoxidase Ab SerPl-aCnc: 21 [IU]/mL (ref 0–34)

## 2023-09-12 LAB — T3, FREE: T3, Free: 2.5 pg/mL (ref 2.0–4.4)

## 2023-09-12 NOTE — Telephone Encounter (Signed)
called patient . she hung up on me. she called back and she stated that she needed the notes from September and October.  I told her that the request would have to go to medical records.  She states she did not need medical records that she needed the notes.  Pt was told that notes and medical records is the same thing and that any request would have to go to medical records department to process.  She states someone told her that we had 30 day to send thoses records. pt state that we are messing with her money. that she going to be homeless.  I tried to explain to patient that our process was that provider fills out the form and then the completed form is faxed to the company but that the medical records request is faxed to our medical records department.  Pt stated that it has never been done that way before and why are we giving her a hard time.  Pt was explained that this is our process that nothing has changed. That medical records request have to go to medical records to process.  I told patient that I could call medical records to check the status of the request.

## 2023-09-12 NOTE — Progress Notes (Signed)
09/12/2023, 3:25 PM  Endocrinology follow-up note   Subjective:    Patient ID: Olivia Woodward, female    DOB: [birth year], PCP Arriyana Perches, MD   Past Medical History:  Diagnosis Date   ANEMIA 07/25/2010   Anxiety    Phreesia 03/18/2021   Depression    Phreesia 03/18/2021   Generalized headaches    GERD (gastroesophageal reflux disease)    Hypertension    Past Surgical History:  Procedure Laterality Date   ABDOMINAL HYSTERECTOMY     fibroids, partial   BREAST REDUCTION SURGERY  2005   CHOLECYSTECTOMY N/A 05/27/2022   Procedure: LAPAROSCOPIC CHOLECYSTECTOMY;  Surgeon: Franky Macho, MD;  Location: [facility];  Service: General;  Laterality: N/A;   COLONOSCOPY WITH PROPOFOL N/A 05/10/2019   Two 5 to 6 mm polyps in the sigmoid colon and in the ascending colon, removed with a   GASTRIC BYPASS  2007   LAPAROSCOPIC NEPHRECTOMY     POLYPECTOMY  05/10/2019   Procedure: POLYPECTOMY;  Surgeon: Corbin Ade, MD;  Location: [facility];  Service: Endoscopy;;  colon   ROBOT ASSISTED LAPAROSCOPIC NEPHRECTOMY Left 09/30/2022   Procedure: XI ROBOTIC ASSISTED LAPAROSCOPIC NEPHRECTOMY;  Surgeon: Malen Gauze, MD;  Location: [facility];  Service: Urology;  Laterality: Left;   Social History   Socioeconomic History   Marital status: Single    Spouse name: Not on file   Number of children: 2   Years of education: 12   Highest education level: Not on file  Occupational History   Not on file  Tobacco Use   Smoking status: Never   Smokeless tobacco: Never  Vaping Use   Vaping status: Never Used  Substance and Sexual Activity   Alcohol use: No   Drug use: No   Sexual activity: Not Currently  Other Topics Concern   Not on file  Social History Narrative   Right handed   Caffeine use: tea/soda twice weekly   Social Determinants of Health   Financial Resource Strain: Not on file  Food Insecurity: No Food  Insecurity (09/30/2022)   Hunger Vital Sign    Worried About Running Out of Food in the Last Year: Never true    Ran Out of Food in the Last Year: Never true  Transportation Needs: No Transportation Needs (09/30/2022)   PRAPARE - Administrator, Civil Service (Medical): No    Lack of Transportation (Non-Medical): No  Physical Activity: Not on file  Stress: Not on file  Social Connections: Not on file   Family History  Problem Relation Age of Onset   Hypertension Mother    Diabetes Mother    Hypertension Father    Hypertension Sister    Hypertension Sister    Colon cancer Neg Hx    Outpatient Encounter Medications as of 09/12/2023  Medication Sig   amLODipine (NORVASC) 10 MG tablet Take 1 tablet (10 mg total) by mouth daily.   busPIRone (BUSPAR) 5 MG tablet Take 1 tablet (5 mg total) by mouth 2 (two) times daily.   cariprazine (VRAYLAR) 3 MG capsule Take 1 capsule (3 mg total) by mouth daily.   Cyanocobalamin (VITAMIN B12) 1000 MCG TBCR Take 2 tablets  by mouth once daily   escitalopram (LEXAPRO) 20 MG tablet Take 1 tablet (20 mg total) by mouth daily.   Eszopiclone 3 MG TABS Take 1 tablet (3 mg total) by mouth at bedtime as needed (insomnia).   hydrOXYzine (ATARAX) 25 MG tablet Take 1 tablet (25 mg total) by mouth daily as needed for anxiety.   pantoprazole (PROTONIX) 40 MG tablet TAKE 1 TABLET BY MOUTH ONCE DAILY BEFORE BREAKFAST   promethazine (PHENERGAN) 25 MG tablet Take one tablet by mouth once daily , as needed, for nausea   rizatriptan (MAXALT-MLT) 10 MG disintegrating tablet Take 1 tablet (10 mg total) by mouth as needed for migraine. May repeat in 2 hours if needed   spironolactone (ALDACTONE) 100 MG tablet Take 1 tablet (100 mg total) by mouth daily.   topiramate (TOPAMAX) 100 MG tablet Take 1 tablet (100 mg total) by mouth 2 (two) times daily.   No facility-administered encounter medications on file as of 09/12/2023.   ALLERGIES: Allergies  Allergen  Reactions   Ketorolac Tromethamine Hives    Lips swelled    VACCINATION STATUS: Immunization History  Administered Date(s) Administered   Influenza Whole 08/24/2009, 07/25/2010, 09/28/2016   Influenza, High Dose Seasonal PF 11/18/2020   Influenza,inj,Quad PF,6+ Mos 07/09/2018, 07/19/2019, 09/21/2021, 07/25/2022   Janssen (J&J) SARS-COV-2 Vaccination 01/30/2020, 11/18/2020   Moderna Sars-Covid-2 Vaccination 10/18/2022   Td 07/25/2010   Tdap 04/16/2022   Zoster Recombinant(Shingrix) 05/08/2021, 09/21/2021    HPI Olivia Woodward is 57 y.o. female who presents today with a medical history as above. 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  She is status post RAI thyroid ablation on June 20, 2019.  Her subsequent labs which are consistent with treatment effect, without hypothyroidism.   After her last visit, she was sent for thyroid ultrasound which showed multinodular goiter with 1 suspicious nodule which was biopsied and showed benign findings. She feels better, has no new complaints today.  Her recent thyroid function test were consistent with euthyroid state.     She denies palpitations, tremors, nor heat intolerance. She denies any dysphagia, shortness of breath, nor voice change.  However, she feels dry mouth and dry throat.  Her repeat thyroid ultrasound showed decrease in the general size of her thyroid lobes bilaterally.  She did not have new findings.   She reports steady weight over the last 3 visits.   Review of Systems  Constitutional:  no fatigue, no subjective hyperthermia, no subjective hypothermia   Objective:       09/12/2023    9:40 AM 09/12/2023    9:16 AM 09/04/2023    2:12 PM  Vitals with BMI  Height  5\' 6"  5\' 6"   Weight  243 lbs 6 oz 240 lbs 10 oz  BMI  39.3 38.85  Systolic 138 142 161  Diastolic 86 88 82  Pulse  76 73    BP 138/86  Comment: R arm with manual cuff. Advised pt to continue checking BP at home. Dr.Kaye Woodward made aware.  Pulse 76   Ht 5\' 6"  (1.676 m)   Wt 243 lb 6.4 oz (110.4 kg)   BMI 39.29 kg/m   Wt Readings from Last 3 Encounters:  09/12/23 243 lb 6.4 oz (110.4 kg)  09/04/23 240 lb 9.6 oz (109.1 kg)  08/05/23 231 lb (104.8 kg)    Physical Exam  Constitutional:  Body mass index is  39.29 kg/m.,  not in acute distress, normal state of mind Eyes: PERRLA, EOMI, no exophthalmos ENT: moist mucous membranes, + decrease in the size of her thyroid compared to last exam,  no gross cervical lymphadenopathy    CMP ( most recent) CMP     Component Value Date/Time   NA 140 07/02/2023 0903   K 4.7 07/02/2023 0903   CL 104 07/02/2023 0903   CO2 24 07/02/2023 0903   GLUCOSE 85 07/02/2023 0903   GLUCOSE 93 10/15/2022 1059   BUN 12 07/02/2023 0903   CREATININE 1.34 (H) 07/02/2023 0903   CREATININE 0.71 07/10/2018 1015   CALCIUM 9.8 07/02/2023 0903   PROT 7.0 07/02/2023 0903   ALBUMIN 4.1 07/02/2023 0903   AST 14 07/02/2023 0903   ALT 7 07/02/2023 0903   ALKPHOS 39 (L) 07/02/2023 0903   BILITOT 0.6 07/02/2023 0903   GFRNONAA 40 (L) 10/15/2022 1059   GFRNONAA 100 07/10/2018 1015   GFRAA 87 09/06/2020 0843   GFRAA 116 07/10/2018 1015     Diabetic Labs (most recent): Lab Results  Component Value Date   HGBA1C 5.7 (H) 07/02/2023   HGBA1C 5.4 03/22/2021   HGBA1C 5.3 07/26/2010     Lipid Panel     Component Value Date/Time   CHOL 191 09/11/2023 0841   CHOL 185 07/02/2023 0903   TRIG 27 09/11/2023 0841   HDL 74 09/11/2023 0841   HDL 74 07/02/2023 0903   CHOLHDL 2.6 09/11/2023 0841   VLDL 5 09/11/2023 0841   LDLCALC 112 (H) 09/11/2023 0841   LDLCALC 103 (H) 07/02/2023 0903   LDLCALC 71 12/27/2017 0949   LABVLDL 8 07/02/2023 0903      Lab Results  Component Value Date   TSH 4.259 09/11/2023   TSH 5.060 (H) 07/02/2023   TSH 2.930 04/03/2022   TSH 2.542 10/04/2021   TSH 0.820 08/02/2021    TSH 0.409 (L) 03/22/2021   TSH 0.278 (L) 09/06/2020   TSH 0.279 (L) 09/06/2020   TSH 0.375 11/19/2018   TSH 0.339 (L) 10/26/2018   FREET4 0.73 09/11/2023   FREET4 1.05 04/03/2022   FREET4 0.94 10/04/2021   FREET4 0.74 08/02/2021   FREET4 1.12 09/06/2020   FREET4 CANCELED 10/26/2018   FREET4 1.10 07/26/2010      FINAL MICROSCOPIC DIAGNOSIS: On September 05, 2023 - Benign follicular nodule (Bethesda category II)   SPECIMEN ADEQUACY:  Satisfactory for evaluation   Assessment & Plan:   1. Toxic multinodular goiter 2. Subclinical hyperthyroidism-resolved She is status post RAI ablation of toxic thyroid nodule on the left lobe on June 19, 2021.  Her previsit thyroid function tests remain consistent with euthyroid state.  She has history of benign FNA from one of the right sided nodules in 2021.Her surveillance thyroid ultrasound showed multinodular goiter with 1 suspicious nodule which was biopsied and returned benign findings.   She is made aware of the need for continued monitoring of thyroid function at least every 6 months for now to not miss a chance to initiate thyroid hormone replacement on time.  She will not need surgical intervention at this time. She is encouraged to continue her blood pressure medications including amlodipine, Aldactone.  Her LDL recently was above target, she will need yearly fasting lipid panel.  - she is advised to maintain close follow up with Olivia Perches, MD for primary care needs.   I spent  22  minutes in the care of the patient today including review of labs from  Thyroid Function, CMP, and other relevant labs ; imaging/biopsy records (current and previous including abstractions from other facilities); face-to-face time discussing  her lab results and symptoms, medications doses, her options of short and long term treatment based on the latest standards of care / guidelines;   and documenting the encounter.  Olivia Woodward  participated in  the discussions, expressed understanding, and voiced agreement with the above plans.  All questions were answered to her satisfaction. she is encouraged to contact clinic should she have any questions or concerns prior to her return visit.   Follow up plan: Return in about 6 months (around 03/12/2024) for F/U with Pre-visit Labs.   Olivia Lunch, MD [facility] [address] [place], [place] [address] Phone: [phone]  Fax: [phone]     09/12/2023, 3:25 PM  This note was partially dictated with voice recognition software. Similar sounding words can be transcribed inadequately or may not  be corrected upon review.

## 2023-09-12 NOTE — Telephone Encounter (Signed)
Noted, thanks!

## 2023-09-12 NOTE — Telephone Encounter (Signed)
Val request help with this:  Olivia Woodward PLEASE help me with getting the medical notes from the appointments I had in October and September they are saying that you all sent back a response saying you all have 39days to get the office notes to them. Please this is dealing with my job and my pay. Please can you send the office notes ASaP. Please! Can you call me please [phone].

## 2023-09-12 NOTE — Telephone Encounter (Signed)
called medical records spoke with lisaida she states that they did received medical records on 10-2, 10-7 and the 15th. she stated that the 10-2 and the 10-7 medical records requested was denied because the company was requesting medical records with the wrong dates.  Olivia Woodward stated , that they faxed the company back  on 10-2 that they needed to send  correct dates that no medical records for the dates that they were requesting.  She stated that the company sent another request on 10-7 but it was the same request as 10-2 so it was also denied. So they resent the information back to them again, stating that they need to correct the dates.  She states that on 10-15 they received the request again and it looks as if they did request the correct dates but that they have 30 days to complete the request.    I did ask if they could expedite this request because patient states that she can not get her money until it is done and she has bills to pay and she going to be homeless if she don't get her money.   Olivia Woodward states that she will send a message to the person that is working on this request but that it is no guarantee that it would be done.  I asked who the person was that was processing this request she states it is shacara.    Olivia Woodward states she has sent a message to [employer].  I ask if it was any way that she can call or email me when it is done.

## 2023-09-12 NOTE — Telephone Encounter (Signed)
Pt was notified.  
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
## 2023-09-15 ENCOUNTER — Telehealth (INDEPENDENT_AMBULATORY_CARE_PROVIDER_SITE_OTHER): Payer: Self-pay

## 2023-09-15 NOTE — Telephone Encounter (Signed)
Left a message with thyroid biopsy result

## 2023-09-16 ENCOUNTER — Telehealth: Payer: Self-pay

## 2023-09-16 NOTE — Telephone Encounter (Signed)
call and spoke with simone from medical records. i explained what was going on and that when she told me that medical records request was received on the 15th but that it was sent back to [facility] [employer] becuase instead of "dr. Vanetta Shawl" it needed to say "[facility]".   I explained that it was for Dr. Vanetta Shawl office notes for the 07-25-23 and 08-22-23 office notes. That is the only info that was needed.    She stated that she would send a message and see if it can be done.

## 2023-09-16 NOTE — Telephone Encounter (Signed)
I called medical records again today and I spoke with a lady (I'm sorry I did not get her name) but she stated that the release of information was sent back to [employer] because It needed to say [facility] on it instead of Dr. Vanetta Shawl. and that is what is holding up the process and that it would take 30 days to process.  I explained that I received a call from patient on 09-12-23, She was very upset that the records had not been sent yet.  She stated that she was fixing to be homeless because she was not able to pay because [employer] has not received the medical records needed to process her claim.  I spoke with [name] from medical records and she stated that on the 25th .  According to her, that on the 2nd they received the first request but that it was returned to [employer] because the dates were wrong.  Then another request was sent on the 7th which according to [name] it was the same as the request that was received on the 2nd. So it was returned back to them again.  According to [place],  medical records request was received on the 15th with the correct dates and that Novella Olive was the one working on the request.  Today, Dr. Vanetta Shawl received a form and she ask me to look into what is going with the paperwork.  So, I called, [employer] and spoke with Raeja (the case worker).  She stated that they have send over 4 request with no response with medical records.  I told her that I would contact medical records again.  And now you tell me that it was sent back to [employer] to have request changed again.  I said it was only for 2 notes from dr. Vanetta Shawl, I don't understand why it is taking so long.    She stated that she would put in a note to see if it can be done.

## 2023-09-16 NOTE — Telephone Encounter (Signed)
emailed Rosario Adie and explained what was going on and to see if she could help with this issues. - pending responce.

## 2023-09-16 NOTE — Telephone Encounter (Signed)
I [facility] [employer] [facility] asking if she could help with this situation. she advised me to email Rosario Adie and attach herself and shawn to email also.

## 2023-09-16 NOTE — Telephone Encounter (Signed)
i then asked Cameron Proud (front desk) if she knew any reason that medical records would not process a request that was made out to dr. Vanetta Shawl she advised me to call Randa Evens at [phone]

## 2023-09-16 NOTE — Telephone Encounter (Signed)
dr. Vanetta Shawl received form that she stated she needed me to find out what to do with it. I was a request for medical records from [facility]. and it had on the top of the fax that it was 4-11 so i will contact [employer] and let them know that we did not received all of the forms.

## 2023-09-16 NOTE — Telephone Encounter (Signed)
spoke with patient case worker Raeja. she states that it was only 4 pages and that it is requesting medical records. she states that she has sent over 4 request so far and has not received info yet. I told her that I would call medical records and find out what is doing on.

## 2023-09-18 ENCOUNTER — Encounter: Payer: Self-pay | Admitting: Adult Health

## 2023-09-18 ENCOUNTER — Ambulatory Visit: Payer: Managed Care, Other (non HMO) | Admitting: Adult Health

## 2023-09-18 VITALS — BP 147/89 | HR 70 | Ht 66.0 in | Wt 244.0 lb

## 2023-09-18 DIAGNOSIS — F99 Mental disorder, not otherwise specified: Secondary | ICD-10-CM

## 2023-09-18 DIAGNOSIS — R400 Somnolence: Secondary | ICD-10-CM

## 2023-09-18 DIAGNOSIS — G4733 Obstructive sleep apnea (adult) (pediatric): Secondary | ICD-10-CM | POA: Diagnosis not present

## 2023-09-18 DIAGNOSIS — F5105 Insomnia due to other mental disorder: Secondary | ICD-10-CM

## 2023-09-18 NOTE — Progress Notes (Signed)
@Patient  ID: Olivia Woodward, female    DOB: 24-Oct-1966, 57 y.o.   MRN: [id number]  Chief Complaint  Patient presents with   Consult    Referring provider: Velta Perches, MD  HPI: 57 year old female seen for sleep consult September 18, 2023 for daytime sleepiness and insomnia  Patient of Dr. Sherene Sires  , History of PE and Post viral cough   TEST/EVENTS :    09/18/2023 Sleep consult  Patient presents for sleep consult.  Kindly referred by primary care provider.  Patient has a history of chronic insomnia and depression.  Is followed by psychiatry.  Has tried several different sleep aids in the past.  She has tried Ambien in the past with no perceived benefit.  Is currently taking Lunesta 3 mg.  She says it does help her get to sleep but only sleeps for about 3 to 4 hours and then wakes up frequently.  She does have some daytime sleepiness.  She goes to bed about 10 PM.  Can take a long time to go to sleep if she does not use Lunesta.  Is up multiple times throughout the night.  Gets up at 7:30 AM.  Weight is up 5 pounds over the last 2 years current weight is at 244 pounds with a BMI at 39.  She has never had a sleep study before.  She does not have any history of congestive heart failure or stroke.  She does not nap.  She has no removable dental work.  No symptoms suspicious for cataplexy or sleep paralysis.  She works at [employer].  She does customer service work. Epworth score is 3 out of 24.  Gets sleepy if she sits down to rest or in the evening hours.  Social history patient lives with her daughter.  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  She works Water engineer.  She has children.  She is single.  She is a never smoker.  No alcohol or drug use.  Family history negative.  Surgery  Past Surgical History:  Procedure Laterality Date   ABDOMINAL HYSTERECTOMY     fibroids, partial   BREAST REDUCTION SURGERY  2005   CHOLECYSTECTOMY N/A 05/27/2022   Procedure: LAPAROSCOPIC  CHOLECYSTECTOMY;  Surgeon: Franky Macho, MD;  Location: [facility];  Service: General;  Laterality: N/A;   COLONOSCOPY WITH PROPOFOL N/A 05/10/2019   Two 5 to 6 mm polyps in the sigmoid colon and in the ascending colon, removed with a   GASTRIC BYPASS  2007   LAPAROSCOPIC NEPHRECTOMY     POLYPECTOMY  05/10/2019   Procedure: POLYPECTOMY;  Surgeon: Corbin Ade, MD;  Location: [facility];  Service: Endoscopy;;  colon   ROBOT ASSISTED LAPAROSCOPIC NEPHRECTOMY Left 09/30/2022   Procedure: XI ROBOTIC ASSISTED LAPAROSCOPIC NEPHRECTOMY;  Surgeon: Malen Gauze, MD;  Location: [facility];  Service: Urology;  Laterality: Left;       Allergies  Allergen Reactions   Ketorolac Tromethamine Hives    Lips swelled    Immunization History  Administered Date(s) Administered   Influenza Whole 08/24/2009, 07/25/2010, 09/28/2016   Influenza, High Dose Seasonal PF 11/18/2020   Influenza,inj,Quad PF,6+ Mos 07/09/2018, 07/19/2019, 09/21/2021, 07/25/2022   Janssen (J&J) SARS-COV-2 Vaccination 01/30/2020, 11/18/2020   Moderna Sars-Covid-2 Vaccination 10/18/2022   Td 07/25/2010   Tdap 04/16/2022   Zoster Recombinant(Shingrix) 05/08/2021, 09/21/2021    Past Medical History:  Diagnosis Date   ANEMIA 07/25/2010   Anxiety    Phreesia 03/18/2021   Depression  [employer]reesia 03/18/2021   Generalized headaches    GERD (gastroesophageal reflux disease)    Hypertension     Tobacco History: Social History   Tobacco Use  Smoking Status Never  Smokeless Tobacco Never   Counseling given: Not Answered   Outpatient Medications Prior to Visit  Medication Sig Dispense Refill   amLODipine (NORVASC) 10 MG tablet Take 1 tablet (10 mg total) by mouth daily. 90 tablet 1   busPIRone (BUSPAR) 5 MG tablet Take 1 tablet (5 mg total) by mouth 2 (two) times daily. 60 tablet 1   cariprazine (VRAYLAR) 3 MG capsule Take 1 capsule (3 mg total) by mouth daily. 30 capsule 3   Cyanocobalamin (VITAMIN B12) 1000 MCG  TBCR Take 2 tablets by mouth once daily 180 tablet 2   escitalopram (LEXAPRO) 20 MG tablet Take 1 tablet (20 mg total) by mouth daily. 30 tablet 5   Eszopiclone 3 MG TABS Take 1 tablet (3 mg total) by mouth at bedtime as needed (insomnia). 30 tablet 1   hydrOXYzine (ATARAX) 25 MG tablet Take 1 tablet (25 mg total) by mouth daily as needed for anxiety. 30 tablet 1   pantoprazole (PROTONIX) 40 MG tablet TAKE 1 TABLET BY MOUTH ONCE DAILY BEFORE BREAKFAST 30 tablet 3   promethazine (PHENERGAN) 25 MG tablet Take one tablet by mouth once daily , as needed, for nausea 30 tablet 5   rizatriptan (MAXALT-MLT) 10 MG disintegrating tablet Take 1 tablet (10 mg total) by mouth as needed for migraine. May repeat in 2 hours if needed 30 tablet 1   spironolactone (ALDACTONE) 100 MG tablet Take 1 tablet (100 mg total) by mouth daily. 90 tablet 1   topiramate (TOPAMAX) 100 MG tablet Take 1 tablet (100 mg total) by mouth 2 (two) times daily. 180 tablet 1   No facility-administered medications prior to visit.     Review of Systems:   Constitutional:   No  weight loss, night sweats,  Fevers, chills, +fatigue, or  lassitude.  HEENT:   No headaches,  Difficulty swallowing,  Tooth/dental problems, or  Sore throat,                No sneezing, itching, ear ache, nasal congestion, post nasal drip,   CV:  No chest pain,  Orthopnea, PND, swelling in lower extremities, anasarca, dizziness, palpitations, syncope.   GI  No heartburn, indigestion, abdominal pain, nausea, vomiting, diarrhea, change in bowel habits, loss of appetite, bloody stools.   Resp: No shortness of breath with exertion or at rest.  No excess mucus, no productive cough,  No non-productive cough,  No coughing up of blood.  No change in color of mucus.  No wheezing.  No chest wall deformity  Skin: no rash or lesions.  GU: no dysuria, change in color of urine, no urgency or frequency.  No flank pain, no hematuria   MS:  No joint pain or swelling.  No  decreased range of motion.  No back pain.    Physical Exam  BP (!) 147/89   Pulse 70   Ht 5\' 6"  (1.676 m)   Wt 244 lb (110.7 kg)   SpO2 97% Comment: RA  BMI 39.38 kg/m   GEN: A/Ox3; pleasant , NAD, well nourished    HEENT:  [place]/AT,   NOSE-clear, THROAT-clear, no lesions, no postnasal drip or exudate noted. Class 3 MP airway   NECK:  Supple w/ fair ROM; no JVD; normal carotid impulses w/o bruits; no thyromegaly or nodules palpated;  no lymphadenopathy.    RESP  Clear  P & A; w/o, wheezes/ rales/ or rhonchi. no accessory muscle use, no dullness to percussion  CARD:  RRR, no m/r/g, no peripheral edema, pulses intact, no cyanosis or clubbing.  GI:   Soft & nt; nml bowel sounds; no organomegaly or masses detected.   Musco: Warm bil, no deformities or joint swelling noted.   Neuro: alert, no focal deficits noted.    Skin: Warm, no lesions or rashes No results found for: "NITRICOXIDE"      Assessment & Plan:   Daytime sleepiness Daytime sleepiness, restless sleep, chronic insomnia all concerning for possible underlying sleep apnea.  Will set patient up for home sleep study to rule out sleep apnea.  Patient education given on sleep apnea. - discussed how weight can impact sleep and risk for sleep disordered breathing - discussed options to assist with weight loss: combination of diet modification, cardiovascular and strength training exercises   - had an extensive discussion regarding the adverse health consequences related to untreated sleep disordered breathing - specifically discussed the risks for hypertension, coronary artery disease, cardiac dysrhythmias, cerebrovascular disease, and diabetes - lifestyle modification discussed   - discussed how sleep disruption can increase risk of accidents, particularly when driving - safe driving practices were discussed      Patient Instructions  Set up for home sleep study  Work on healthy sleep regimen as we discussed  Do not  drive if sleepy  Use caution with sedating medications  Continue on Lunesta At bedtime  As needed  insomnia  Follow up in 6 weeks to discuss results and treatment plan- virtual visit .     Insomnia due to other mental disorder Chronic insomnia -healthy sleep regimen discussed with patient.  Continue current regimen from psychiatry.     Rubye Oaks, NP 09/18/2023

## 2023-09-18 NOTE — Assessment & Plan Note (Signed)
Daytime sleepiness, restless sleep, chronic insomnia all concerning for possible underlying sleep apnea.  Will set patient up for home sleep study to rule out sleep apnea.  Patient education given on sleep apnea. - discussed how weight can impact sleep and risk for sleep disordered breathing - discussed options to assist with weight loss: combination of diet modification, cardiovascular and strength training exercises   - had an extensive discussion regarding the adverse health consequences related to untreated sleep disordered breathing - specifically discussed the risks for hypertension, coronary artery disease, cardiac dysrhythmias, cerebrovascular disease, and diabetes - lifestyle modification discussed   - discussed how sleep disruption can increase risk of accidents, particularly when driving - safe driving practices were discussed      Patient Instructions  Set up for home sleep study  Work on healthy sleep regimen as we discussed  Do not drive if sleepy  Use caution with sedating medications  Continue on Lunesta At bedtime  As needed  insomnia  Follow up in 6 weeks to discuss results and treatment plan- virtual visit .

## 2023-09-18 NOTE — Assessment & Plan Note (Signed)
Chronic insomnia -healthy sleep regimen discussed with patient.  Continue current regimen from psychiatry.

## 2023-09-18 NOTE — Patient Instructions (Signed)
Set up for home sleep study  Work on healthy sleep regimen as we discussed  Do not drive if sleepy  Use caution with sedating medications  Continue on Lunesta At bedtime  As needed  insomnia  Follow up in 6 weeks to discuss results and treatment plan- virtual visit .

## 2023-09-19 ENCOUNTER — Telehealth (INDEPENDENT_AMBULATORY_CARE_PROVIDER_SITE_OTHER): Payer: 59 | Admitting: Psychiatry

## 2023-09-19 ENCOUNTER — Ambulatory Visit: Payer: Managed Care, Other (non HMO) | Admitting: Gastroenterology

## 2023-09-19 ENCOUNTER — Encounter: Payer: Self-pay | Admitting: Psychiatry

## 2023-09-19 DIAGNOSIS — F331 Major depressive disorder, recurrent, moderate: Secondary | ICD-10-CM | POA: Diagnosis not present

## 2023-09-19 DIAGNOSIS — G4700 Insomnia, unspecified: Secondary | ICD-10-CM | POA: Diagnosis not present

## 2023-09-19 MED ORDER — BUSPIRONE HCL 5 MG PO TABS: 5.0000 mg | ORAL_TABLET | Freq: Two times a day (BID) | ORAL | 1 refills | Status: DC

## 2023-09-19 MED ORDER — ESZOPICLONE 3 MG PO TABS: 3.0000 mg | ORAL_TABLET | Freq: Every evening | ORAL | 1 refills | Status: DC | PRN

## 2023-09-19 MED ORDER — HYDROXYZINE HCL 25 MG PO TABS: 25.0000 mg | ORAL_TABLET | Freq: Every day | ORAL | 3 refills | Status: DC | PRN

## 2023-09-24 ENCOUNTER — Telehealth: Payer: Self-pay | Admitting: Psychiatry

## 2023-09-24 NOTE — Telephone Encounter (Signed)
Forms for [employer] faxed successfully. Scanned in to [employer]. Sent to medical records for filing

## 2023-09-26 ENCOUNTER — Telehealth: Payer: Self-pay

## 2023-09-26 NOTE — Telephone Encounter (Signed)
according to val new [employer] called. " also someone from [employer] called about the [facility] patient disability forms [phone]" so i called and they was calling to get the physical form completed he was told that dr. Vanetta Shawl was a psychiatric md and that we do not do physical at our office.    I did contact Inetta Fermo (front desk) to refax paper work that has a note that dr. Vanetta Shawl is a psychiatric provider and that we don't do physicals at our office. And fax it back on [phone]

## 2023-09-29 ENCOUNTER — Ambulatory Visit: Payer: Managed Care, Other (non HMO) | Admitting: Adult Health

## 2023-09-29 DIAGNOSIS — G4733 Obstructive sleep apnea (adult) (pediatric): Secondary | ICD-10-CM

## 2023-10-06 NOTE — Telephone Encounter (Signed)
I've spent a total time of 10 minutes providing service to this patient-generated inquiry in the MyChart message

## 2023-10-08 ENCOUNTER — Telehealth: Payer: Self-pay

## 2023-10-08 NOTE — Telephone Encounter (Signed)
pt called states that the dates you put down was wrong. she states that you put down 9-24 to 03-31-23 she states that the correct dates is suppose to be 08-01-23 to 12-1-+24   Please correct and resend

## 2023-10-09 ENCOUNTER — Encounter: Payer: Self-pay | Admitting: Psychiatry

## 2023-10-09 NOTE — Telephone Encounter (Signed)
left message to call office back with who and the fax number to send.

## 2023-10-09 NOTE — Telephone Encounter (Signed)
I wrote a letter about this addendum. Could you fax it to them? thanks

## 2023-10-10 NOTE — Telephone Encounter (Signed)
letter was emailed to patient yesterday.

## 2023-10-15 ENCOUNTER — Telehealth: Payer: Self-pay

## 2023-10-15 NOTE — Telephone Encounter (Signed)
according to her she states that they were sending the new paperwork today. but i will call her and let her know that you already done one.

## 2023-10-15 NOTE — Telephone Encounter (Signed)
I recently completed the paperwork and am wondering if that's what they are referring to.

## 2023-10-15 NOTE — Telephone Encounter (Signed)
pt called left message that [facility] [employer] would not accept the letter that they will be sending over all new paperwork to be completed.

## 2023-10-25 ENCOUNTER — Other Ambulatory Visit: Payer: Self-pay | Admitting: Family Medicine

## 2023-10-25 DIAGNOSIS — I10 Essential (primary) hypertension: Secondary | ICD-10-CM

## 2023-10-28 ENCOUNTER — Telehealth: Payer: Self-pay

## 2023-10-28 NOTE — Telephone Encounter (Signed)
pt was called back and i asked her to confirm the dates that she needed to be on the form. she stated whatever the letter had on it. pt was told that i printed out the old form and when dr. Vanetta Shawl comes back into the office next week that she would complete.

## 2023-10-28 NOTE — Telephone Encounter (Signed)
called pt explained what was said and she states that was wrong that i needed to speak with them about the [employer] and not short term.

## 2023-10-28 NOTE — Telephone Encounter (Signed)
called spoke with a [place] she states that they received the info from dr. Vanetta Shawl that they needed paperwork from [employer] and from [employer]. i asked what fax# did they send [employer] info too. she stated [phone] i let her know that the fax # was [phone] not [phone].  She stated that she would refax to the corrected fax #.  I also gave her the phone # to [employer] [phone]. She states that they emailed [employer].  I advised her to call and make sure that [employer] received the email that the email may not have been corrected.

## 2023-10-28 NOTE — Telephone Encounter (Signed)
pt left a message that [employer] is waiting on [employer] form to be completed. and sent back . pt states that it has the wrong dates and that they will not accept the letter.Marland Kitchen

## 2023-10-28 NOTE — Telephone Encounter (Signed)
called pt back she states that we received a fax yesterday and that she needs that form completed and faxed back. I told pt that we have not received any new fax form yesterday or today and that the last form was back in november. pt stated that i was ... Lying because she was on the phone when they faxed it over.  I told her that I would check with the front desk staff and the other cma.  Pt was placed on hold until I asked to see if fax was sent. No one had received fax so I connected back with the pt and explained that we did not receive the form and she got upset again..... I explained that unless he got a confirmation that it went threw.  I asked her for the information to call and speak with someone she gave me the phone # [phone] case # [id number].

## 2023-10-28 NOTE — Telephone Encounter (Signed)
called back this time i spoke with a [place]. she states that the form on section B the dates needs to be corrected. that it needed month date and year and the end date also needs to be corrected. i requested a fax # and she states to use fax # [phone].

## 2023-10-30 NOTE — Telephone Encounter (Signed)
Called the patient. She states that it has been approved based on the call yesterday. She states that no form needs to be done this time.
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
## 2023-11-05 ENCOUNTER — Telehealth (INDEPENDENT_AMBULATORY_CARE_PROVIDER_SITE_OTHER): Payer: 59 | Admitting: Psychiatry

## 2023-11-05 ENCOUNTER — Encounter: Payer: Self-pay | Admitting: Psychiatry

## 2023-11-05 DIAGNOSIS — G4700 Insomnia, unspecified: Secondary | ICD-10-CM | POA: Diagnosis not present

## 2023-11-05 DIAGNOSIS — F331 Major depressive disorder, recurrent, moderate: Secondary | ICD-10-CM

## 2023-11-05 MED ORDER — ESCITALOPRAM OXALATE 20 MG PO TABS: 20.0000 mg | ORAL_TABLET | Freq: Every day | ORAL | 5 refills | Status: DC

## 2023-11-05 MED ORDER — ESZOPICLONE 3 MG PO TABS: 3.0000 mg | ORAL_TABLET | Freq: Every evening | ORAL | 1 refills | Status: DC | PRN

## 2023-11-05 MED ORDER — BUSPIRONE HCL 10 MG PO TABS: 10.0000 mg | ORAL_TABLET | Freq: Two times a day (BID) | ORAL | 1 refills | Status: DC

## 2023-11-05 NOTE — Patient Instructions (Signed)
Continue lexapro 20 mg daily Increase Buspar 10 mg twice a day  Continue Vraylar 3 mg daily  Continue Lunesta 3 mg nightly as needed for insomnia Continue hydroxyzine 25 mg daily as needed for anxiety Next appointment: 2/14 at 11 am

## 2023-11-10 ENCOUNTER — Encounter: Payer: Self-pay | Admitting: Adult Health

## 2023-11-10 ENCOUNTER — Telehealth: Payer: Managed Care, Other (non HMO) | Admitting: Adult Health

## 2023-11-13 ENCOUNTER — Ambulatory Visit (HOSPITAL_COMMUNITY): Payer: 59 | Admitting: Psychiatry

## 2023-11-13 DIAGNOSIS — F331 Major depressive disorder, recurrent, moderate: Secondary | ICD-10-CM | POA: Diagnosis not present

## 2023-11-13 NOTE — Progress Notes (Signed)
Virtual Visit via Video Note  I connected with DAKIA WOOLLEY on 11/13/23 at 4:14 PM EST  by a video enabled telemedicine application and verified that I am speaking with the correct person using two identifiers.  Location: Patient: Home Provider: [facility] [place] office    I discussed the limitations of evaluation and management by telemedicine and the availability of in person appointments. The patient expressed understanding and agreed to proceed.   I provided 18 minutes of non-face-to-face time during this encounter.   Adah Salvage, LCSW     THERAPIST PROGRESS NOTE  Session Time:  Thursday  11/13/2023 4:14 PM - 4:32 PM   Participation Level: Active  Behavioral Response: CasualAlert/less depressed   Type of Therapy: Individual Therapy  Treatment Goals addressed: Layken will score less than 8 on the Patient Health Questionnaire (PHQ-9) Yorleni will practice behavioral activation skills 4 times per week for the next 26 weeks   Progress Towards Goals: Initial   Interventions: CBT and Supportive  Summary: NORAIDA KHOSRAVI is a 57 y.o. female who is referred for services by PCP Dr. Lodema Hong due to pt experiencing symptoms of depression and anxiety. She denies any psychiatric hospitalizations. She participated in therapy with Dr. Evelene Croon for about 2 years. She is a returning pt to this clinician and last was seen in 2020.  She is resuming services as she reports having a lot of stress related to her health issues and worries about her children.  She also reports stress regarding being caretaker for her parents as father has prostate cancer and refuses treatment and her mother is going blind.  Current symptoms include depressed mood, difficulty concentrating, fatigue, thoughts and feelings of hopelessness/worthlessness, irritability, restlessness, sleep difficulty, muscle tension, and worry.   Patient last was seen about 8-9 weeks ago.  Patient reports continued symptoms of  depression including depressed mood, poor motivation, and decreased pleasure/interest in activities.  Per patient's report, she started receiving TMS treatment about a month ago.  She reports continued stress regarding caretaker responsibilities for her parents.  She expresses frustration as siblings still do not help with parents care.  Patient also continues to struggle with setting and maintaining limits.  Patient is not feeling well today as she is suffering from a virus as well as a headache.  Therapist and patient agreed to end session early.  Suicidal/Homicidal: Nowithout intent/plan  Therapist Response: Reviewed symptoms, discussed stressors, facilitated expression of thoughts and feelings, validated feelings, encouraged patient to focus on self-care, agreed to end session early  Plan: Return again in 2 weeks.  Diagnosis: Moderate episode of recurrent major depressive disorder (HCC)  Collaboration of Care: Psychiatrist AEB sees psychiatrist Dr. Vanetta Shawl for medication management.  Patient/Guardian was advised Release of Information must be obtained prior to any record release in order to collaborate their care with an outside provider. Patient/Guardian was advised if they have not already done so to contact the registration department to sign all necessary forms in order for [place] to release information regarding their care.   Consent: Patient/Guardian gives verbal consent for treatment and assignment of benefits for services provided during this visit. Patient/Guardian expressed understanding and agreed to proceed.        Adah Salvage, LCSW 11/13/2023

## 2023-11-19 ENCOUNTER — Other Ambulatory Visit: Payer: Self-pay | Admitting: Psychiatry

## 2023-11-27 ENCOUNTER — Ambulatory Visit (INDEPENDENT_AMBULATORY_CARE_PROVIDER_SITE_OTHER): Payer: 59 | Admitting: Psychiatry

## 2023-11-27 DIAGNOSIS — F331 Major depressive disorder, recurrent, moderate: Secondary | ICD-10-CM | POA: Diagnosis not present

## 2023-11-27 NOTE — Addendum Note (Signed)
 Addended by: Florencia Reasons E on: 11/27/2023 09:44 AM   Modules accepted: Level of Service

## 2023-11-27 NOTE — Progress Notes (Signed)
Virtual Visit via Video Note  I connected with Olivia Woodward on 11/27/23 at 9:08 AM EST  by a video enabled telemedicine application and verified that I am speaking with the correct person using two identifiers.  Location: Patient: Home Provider: [facility] [place] office    I discussed the limitations of evaluation and management by telemedicine and the availability of in person appointments. The patient expressed understanding and agreed to proceed.   I provided 22 minutes of non-face-to-face time during this encounter.   Olivia FORBES Rubinstein, LCSW      THERAPIST PROGRESS NOTE  Session Time:  Thursday  11/27/2023 9:08 AM - 9:30 AM   Participation Level: Active  Behavioral Response: CasualAlert/less depressed   Type of Therapy: Individual Therapy  Treatment Goals addressed: Olivia Woodward will score less than 8 on the Patient Health Questionnaire (PHQ-9) Olivia Woodward will practice behavioral activation skills 4 times per week for the next 26 weeks   Progress Towards Goals: Progressing   Interventions: CBT and Supportive  Summary: Olivia Woodward is a 58 y.o. female who is referred for services by PCP Dr. Antonetta due to pt experiencing symptoms of depression and anxiety. She denies any psychiatric hospitalizations. She participated in therapy with Dr. Vincente for about 2 years. She is a returning pt to this clinician and last was seen in 2020.  She is resuming services as she reports having a lot of stress related to her health issues and worries about her children.  She also reports stress regarding being caretaker for her parents as father has prostate cancer and refuses treatment and her mother is going blind.  Current symptoms include depressed mood, difficulty concentrating, fatigue, thoughts and feelings of hopelessness/worthlessness, irritability, restlessness, sleep difficulty, muscle tension, and worry.   Patient last was seen about 2-3 weeks  ago.  Patient reports decreased  intensity/frequency of symptoms of depression as reflected in the PHQ 2 & 9.  She attributes this to continued participation in TMS and returning to work this past Tuesday. Per pt's report, work forces her to be involved in activity and improves her routine. She expresses frustration with self as she has not become proficient in learning 4 new programs that were implemented on job during her medical leave. She continues to report stress regarding caretaker responsibilities for mother and expresses frustration siblings still don't offer any help.She continues to experience difficulty in being assertive with mother and setting limits. Suicidal/Homicidal: Nowithout intent/plan  Therapist Response: Reviewed symptoms, praised and reinforced pt's increased involvement in activity/improved structure, discussed effects on mood/thoughts/behavior, discussed stressors, facilitated expression of thoughts and feelings, validated feelings, assisted pt identify realistic expectations of self, assisted pt identify ways to schedule time for self and personal rights to promote effective assertion is scheduling time for self.    Plan: Return again in 2 weeks.  Diagnosis: Moderate episode of recurrent major depressive disorder (HCC)  Collaboration of Care: Psychiatrist AEB sees psychiatrist Olivia Woodward for medication management.  Patient/Guardian was advised Release of Information must be obtained prior to any record release in order to collaborate their care with an outside provider. Patient/Guardian was advised if they have not already done so to contact the registration department to sign all necessary forms in order for us  to release information regarding their care.   Consent: Patient/Guardian gives verbal consent for treatment and assignment of benefits for services provided during this visit. Patient/Guardian expressed understanding and agreed to proceed.        Olivia Tolliver E Mikki Ziff, LCSW  11/27/2023  

## 2023-12-04 ENCOUNTER — Encounter: Payer: Self-pay | Admitting: Family Medicine

## 2023-12-04 ENCOUNTER — Ambulatory Visit (INDEPENDENT_AMBULATORY_CARE_PROVIDER_SITE_OTHER): Payer: Managed Care, Other (non HMO) | Admitting: Family Medicine

## 2023-12-04 ENCOUNTER — Other Ambulatory Visit (HOSPITAL_COMMUNITY): Payer: Self-pay | Admitting: Family Medicine

## 2023-12-04 VITALS — BP 118/78 | HR 90 | Ht 66.0 in | Wt 242.1 lb

## 2023-12-04 DIAGNOSIS — E538 Deficiency of other specified B group vitamins: Secondary | ICD-10-CM

## 2023-12-04 DIAGNOSIS — Z1231 Encounter for screening mammogram for malignant neoplasm of breast: Secondary | ICD-10-CM

## 2023-12-04 DIAGNOSIS — I10 Essential (primary) hypertension: Secondary | ICD-10-CM

## 2023-12-04 DIAGNOSIS — F3341 Major depressive disorder, recurrent, in partial remission: Secondary | ICD-10-CM

## 2023-12-04 DIAGNOSIS — Z13220 Encounter for screening for lipoid disorders: Secondary | ICD-10-CM

## 2023-12-04 DIAGNOSIS — Z23 Encounter for immunization: Secondary | ICD-10-CM

## 2023-12-04 DIAGNOSIS — F5105 Insomnia due to other mental disorder: Secondary | ICD-10-CM

## 2023-12-04 DIAGNOSIS — E559 Vitamin D deficiency, unspecified: Secondary | ICD-10-CM

## 2023-12-04 DIAGNOSIS — E0590 Thyrotoxicosis, unspecified without thyrotoxic crisis or storm: Secondary | ICD-10-CM

## 2023-12-04 DIAGNOSIS — G4489 Other headache syndrome: Secondary | ICD-10-CM

## 2023-12-04 DIAGNOSIS — F411 Generalized anxiety disorder: Secondary | ICD-10-CM

## 2023-12-04 DIAGNOSIS — F99 Mental disorder, not otherwise specified: Secondary | ICD-10-CM

## 2023-12-04 MED ORDER — TIRZEPATIDE-WEIGHT MANAGEMENT 2.5 MG/0.5ML ~~LOC~~ SOLN: 2.5000 mg | SUBCUTANEOUS | 0 refills | Status: DC

## 2023-12-04 MED ORDER — CYCLOBENZAPRINE HCL 10 MG PO TABS: ORAL_TABLET | ORAL | 1 refills | Status: DC

## 2023-12-04 MED ORDER — AZELASTINE HCL 0.1 % NA SOLN: 2.0000 | Freq: Two times a day (BID) | NASAL | 12 refills | Status: DC

## 2023-12-04 MED ORDER — PROMETHAZINE HCL 25 MG PO TABS: ORAL_TABLET | ORAL | 5 refills | Status: DC

## 2023-12-04 MED ORDER — SEMAGLUTIDE-WEIGHT MANAGEMENT 0.25 MG/0.5ML ~~LOC~~ SOAJ: 0.2500 mg | SUBCUTANEOUS | 0 refills | Status: DC

## 2023-12-04 MED ORDER — MELATONIN 5 MG PO CAPS: ORAL_CAPSULE | ORAL | 5 refills | Status: DC

## 2023-12-04 MED ORDER — XHANCE 93 MCG/ACT NA EXHU: INHALANT_SUSPENSION | NASAL | 3 refills | Status: DC

## 2023-12-04 NOTE — Patient Instructions (Addendum)
F/U in 10 weeks  Please sched mammogram at checkout  Both zepbound and wegovy have been prescribed, only one , if any will be covered, send me a message so I know please. If none are coverd , I will prescribe phentermine tablet  Changing food choice and stopping sweets and sugar are the most important changes to make for healthy weight  Flu vaccine and pneumonia 20 vaccines in office today  Covid vaccine at your pharmacy in next 1 to 2 weeks  Try melatonin in addition to what you are taking for sleep , 5 mg one to two tablets  Commit to daily use of both nasal sprays prescribed and zyrtec for allergy control   CBC, B12 level, chem 7 and EGFr, Vit D , HBA1C today  BEST for 2025!  Thanks for choosing [facility], we consider it a privelige to serve you.

## 2023-12-05 ENCOUNTER — Encounter: Payer: Self-pay | Admitting: Family Medicine

## 2023-12-05 LAB — CBC
Hematocrit: 39.9 % (ref 34.0–46.6)
Hemoglobin: 13 g/dL (ref 11.1–15.9)
MCH: 30.4 pg (ref 26.6–33.0)
MCHC: 32.6 g/dL (ref 31.5–35.7)
MCV: 93 fL (ref 79–97)
Platelets: 309 10*3/uL (ref 150–450)
RBC: 4.27 x10E6/uL (ref 3.77–5.28)
RDW: 11.9 % (ref 11.7–15.4)
WBC: 6 10*3/uL (ref 3.4–10.8)

## 2023-12-05 LAB — BMP8+EGFR
BUN/Creatinine Ratio: 9 (ref 9–23)
BUN: 11 mg/dL (ref 6–24)
CO2: 24 mmol/L (ref 20–29)
Calcium: 9.7 mg/dL (ref 8.7–10.2)
Chloride: 104 mmol/L (ref 96–106)
Creatinine, Ser: 1.25 mg/dL — ABNORMAL HIGH (ref 0.57–1.00)
Glucose: 91 mg/dL (ref 70–99)
Potassium: 4.5 mmol/L (ref 3.5–5.2)
Sodium: 144 mmol/L (ref 134–144)
eGFR: 51 mL/min/{1.73_m2} — ABNORMAL LOW (ref >59)

## 2023-12-05 LAB — VITAMIN D 25 HYDROXY (VIT D DEFICIENCY, FRACTURES): Vit D, 25-Hydroxy: 23.5 ng/mL — ABNORMAL LOW (ref 30.0–100.0)

## 2023-12-05 LAB — HEMOGLOBIN A1C
Est. average glucose Bld gHb Est-mCnc: 117 mg/dL
Hgb A1c MFr Bld: 5.7 % — ABNORMAL HIGH (ref 4.8–5.6)

## 2023-12-05 LAB — VITAMIN B12: Vitamin B-12: 429 pg/mL (ref 232–1245)

## 2023-12-05 MED ORDER — PHENTERMINE HCL 15 MG PO CAPS: 15.0000 mg | ORAL_CAPSULE | ORAL | 2 refills | Status: DC

## 2023-12-05 MED ORDER — VITAMIN D (ERGOCALCIFEROL) 1.25 MG (50000 UNIT) PO CAPS: 50000.0000 [IU] | ORAL_CAPSULE | ORAL | 2 refills | Status: DC

## 2023-12-10 ENCOUNTER — Encounter: Payer: Self-pay | Admitting: Family Medicine

## 2023-12-11 ENCOUNTER — Telehealth (HOSPITAL_COMMUNITY): Payer: Self-pay | Admitting: Psychiatry

## 2023-12-11 ENCOUNTER — Ambulatory Visit (HOSPITAL_COMMUNITY): Payer: 59 | Admitting: Psychiatry

## 2023-12-11 NOTE — Telephone Encounter (Signed)
Therapist attempted to contact patient via text through care caregility platform for scheduled appt, no response. Therapist called pt and left message indicating attempt and requesting pt call office.

## 2023-12-26 ENCOUNTER — Telehealth: Payer: Managed Care, Other (non HMO) | Admitting: Adult Health
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
## 2024-01-02 ENCOUNTER — Telehealth: Payer: 59 | Admitting: Psychiatry

## 2024-01-02 ENCOUNTER — Encounter: Payer: Self-pay | Admitting: Psychiatry

## 2024-01-02 DIAGNOSIS — G4700 Insomnia, unspecified: Secondary | ICD-10-CM

## 2024-01-02 DIAGNOSIS — F330 Major depressive disorder, recurrent, mild: Secondary | ICD-10-CM | POA: Diagnosis not present

## 2024-01-02 MED ORDER — BUSPIRONE HCL 10 MG PO TABS: 10.0000 mg | ORAL_TABLET | Freq: Two times a day (BID) | ORAL | 1 refills | Status: DC

## 2024-01-02 MED ORDER — ESZOPICLONE 3 MG PO TABS: 3.0000 mg | ORAL_TABLET | Freq: Every evening | ORAL | 1 refills | Status: DC | PRN

## 2024-01-02 MED ORDER — HYDROXYZINE HCL 25 MG PO TABS: 25.0000 mg | ORAL_TABLET | Freq: Every day | ORAL | 3 refills | Status: DC | PRN

## 2024-01-06 ENCOUNTER — Other Ambulatory Visit: Payer: Self-pay | Admitting: Family Medicine

## 2024-01-09 DIAGNOSIS — Z23 Encounter for immunization: Secondary | ICD-10-CM | POA: Insufficient documentation

## 2024-01-09 DIAGNOSIS — I10 Essential (primary) hypertension: Secondary | ICD-10-CM | POA: Insufficient documentation

## 2024-01-09 DIAGNOSIS — E538 Deficiency of other specified B group vitamins: Secondary | ICD-10-CM | POA: Insufficient documentation

## 2024-01-09 DIAGNOSIS — Z13220 Encounter for screening for lipoid disorders: Secondary | ICD-10-CM | POA: Insufficient documentation

## 2024-01-09 NOTE — Assessment & Plan Note (Signed)
 Controlled, no change in medication

## 2024-01-09 NOTE — Assessment & Plan Note (Signed)
Treated by psych slightly improved

## 2024-01-09 NOTE — Assessment & Plan Note (Signed)
Managed by psych still sub optimal control but improved

## 2024-01-09 NOTE — Assessment & Plan Note (Addendum)
After obtaining informed consent, the flu and  pneumococcal  20 vaccines  are  administered , with no adverse effect noted at the time of administration.

## 2024-01-09 NOTE — Assessment & Plan Note (Signed)
 Followed by endo.

## 2024-01-09 NOTE — Assessment & Plan Note (Signed)
 Updated lab needed at/ before next visit.

## 2024-01-09 NOTE — Assessment & Plan Note (Signed)
  Patient re-educated about  the importance of commitment to a  minimum of 150 minutes of exercise per week as able.  The importance of healthy food choices with portion control discussed, as well as eating regularly and within a 12 hour window most days. The need to choose "clean , green" food 50 to 75% of the time is discussed, as well as to make water the primary drink and set a goal of 64 ounces water daily.       12/04/2023    8:10 AM 09/18/2023    9:36 AM 09/12/2023    9:16 AM  Weight /BMI  Weight 242 lb 1.9 oz 244 lb 243 lb 6.4 oz  Height 5\' 6"  (1.676 m) 5\' 6"  (1.676 m) 5\' 6"  (1.676 m)  BMI 39.08 kg/m2 39.38 kg/m2 39.29 kg/m2    Start low dose phentermine and re eval in 8 to 12 weeks

## 2024-01-09 NOTE — Progress Notes (Addendum)
   ALEXIANA LAVERDURE     MRN: [id number]      DOB: 10-18-1966  Chief Complaint  Patient presents with   Follow-up    Follow up flu shot    HPI Ms. Arai is here for follow up and re-evaluation of chronic medical conditions, medication management and review of any available recent lab and radiology data.  Preventive health is updated, specifically  Cancer screening and Immunization.   Questions or concerns regarding consultations or procedures which the PT has had in the interim are  addressed. The PT denies any adverse reactions to current medications since the last visit.  There are no new concerns.  There are no specific complaints    Ros: Denies chest pains, palpitations and leg swelling Denies abdominal pain, nausea, vomiting,diarrhea or constipation.   Denies dysuria, frequency, hesitancy or incontinence. Denies joint pain, swelling and limitation in mobility. Denies headaches, seizures, numbness, or tingling. Denies depression, anxiety or insomnia. Denies skin break down or rash.   PE  BP 118/78 (BP Location: Right Arm, Patient Position: Sitting, Cuff Size: Large)   Pulse 90   Ht 5\' 6"  (1.676 m)   Wt 242 lb 1.9 oz (109.8 kg)   SpO2 98%   BMI 39.08 kg/m   Patient alert and oriented and in no cardiopulmonary distress.  HEENT: No facial asymmetry, EOMI,     Neck supple .  Chest: Clear to auscultation bilaterally.  CVS: S1, S2 no murmurs, no S3.Regular rate.  ABD: Soft non tender.   Ext: No edema  MS: Adequate ROM spine, shoulders, hips and knees.  Skin: Intact, no ulcerations or rash noted.  Psych: Good eye contact, normal affect. Memory intact not anxious or depressed appearing.  CNS: CN 2-12 intact, power,  normal throughout.no focal deficits noted.   Assessment & Plan  Morbid obesity [facility])  Patient re-educated about  the importance of commitment to a  minimum of 150 minutes of exercise per week as able.  The importance of healthy food choices  with portion control discussed, as well as eating regularly and within a 12 hour window most days. The need to choose "clean , green" food 50 to 75% of the time is discussed, as well as to make water the primary drink and set a goal of 64 ounces water daily.       12/04/2023    8:10 AM 09/18/2023    9:36 AM 09/12/2023    9:16 AM  Weight /BMI  Weight 242 lb 1.9 oz 244 lb 243 lb 6.4 oz  Height 5\' 6"  (1.676 m) 5\' 6"  (1.676 m) 5\' 6"  (1.676 m)  BMI 39.08 kg/m2 39.38 kg/m2 39.29 kg/m2    Start low dose phentermine and re eval in 8 to 12 weeks  Encounter for immunization After obtaining informed consent, the flu and  pneumococcal  20 vaccines  are  administered , with no adverse effect noted at the time of administration.    B12 deficiency Updated lab needed at/ before next visit.   GAD (generalized anxiety disorder) Managed by psych still sub optimal control but improved  Headache Controlled, no change in medication   Insomnia due to other mental disorder Uncontrolled encouraged to add melatonin  Vitamin D deficiency Updated lab needed at/ before next visit.   Subclinical hyperthyroidism Followed by endo  MDD (major depressive disorder), recurrent, in partial remission (HCC) Treated by psych slightly improved

## 2024-01-09 NOTE — Assessment & Plan Note (Signed)
Uncontrolled encouraged to add melatonin

## 2024-01-20 ENCOUNTER — Other Ambulatory Visit: Payer: Self-pay | Admitting: Family Medicine

## 2024-01-20 DIAGNOSIS — I10 Essential (primary) hypertension: Secondary | ICD-10-CM

## 2024-02-11 ENCOUNTER — Other Ambulatory Visit: Payer: Self-pay | Admitting: Psychiatry

## 2024-02-12 ENCOUNTER — Other Ambulatory Visit: Payer: Self-pay | Admitting: Family Medicine

## 2024-02-13 ENCOUNTER — Encounter: Payer: Self-pay | Admitting: Family Medicine

## 2024-02-13 ENCOUNTER — Ambulatory Visit (INDEPENDENT_AMBULATORY_CARE_PROVIDER_SITE_OTHER): Payer: Managed Care, Other (non HMO) | Admitting: Family Medicine

## 2024-02-13 VITALS — BP 105/74 | HR 95 | Resp 16 | Ht 66.0 in | Wt 230.0 lb

## 2024-02-13 DIAGNOSIS — G43009 Migraine without aura, not intractable, without status migrainosus: Secondary | ICD-10-CM

## 2024-02-13 DIAGNOSIS — J302 Other seasonal allergic rhinitis: Secondary | ICD-10-CM

## 2024-02-13 DIAGNOSIS — I10 Essential (primary) hypertension: Secondary | ICD-10-CM | POA: Diagnosis not present

## 2024-02-13 DIAGNOSIS — E559 Vitamin D deficiency, unspecified: Secondary | ICD-10-CM

## 2024-02-13 MED ORDER — PHENTERMINE HCL 15 MG PO CAPS: 15.0000 mg | ORAL_CAPSULE | ORAL | 3 refills | Status: DC

## 2024-02-13 NOTE — Assessment & Plan Note (Signed)
 Controlled, no change in medication

## 2024-02-13 NOTE — Patient Instructions (Signed)
 Follow-up in 14 weeks, call if you need to be seen sooner.  Current congratulations on excellent weight loss.  Keep resolved.  Please focus on 64 ounces of water daily and stop drinking beverages with calories as this is impeding weight loss and raising your blood sugar.  Please do commit to increasing exercise to 30 minutes at least 5 days/week.  Medications remain unchanged and will be refilled as needed.  Thanks for choosing [facility], we consider it a privelige to serve you.

## 2024-02-13 NOTE — Assessment & Plan Note (Signed)
 Updated lab needed at/ before next visit.

## 2024-02-13 NOTE — Progress Notes (Signed)
 Olivia Woodward     MRN: [id number]      DOB: 1966/10/10  Chief Complaint  Patient presents with   Hypertension    Follow up     HPI Ms. Dobek is here for follow up and re-evaluation of chronic medical conditions, medication management and review of any available recent lab and radiology data.  Preventive health is updated, specifically  Cancer screening and Immunization.   Questions or concerns regarding consultations or procedures which the PT has had in the interim are  addressed. The PT denies any adverse reactions to current medications since the last visit.  There are no new concerns.  There are no specific complaints   ROS Denies recent fever or chills. Denies sinus pressure, nasal congestion, ear pain or sore throat. Denies chest congestion, productive cough or wheezing. Denies chest pains, palpitations and leg swelling Denies abdominal pain, nausea, vomiting,diarrhea or constipation.   Denies dysuria, frequency, hesitancy or incontinence. Denies joint pain, swelling and limitation in mobility. Denies headaches, seizures, numbness, or tingling. Denies depression, anxiety or insomnia. Denies skin break down or rash.   PE  BP 105/74   Pulse 95   Resp 16   Ht 5\' 6"  (1.676 m)   Wt 230 lb (104.3 kg)   SpO2 99%   BMI 37.12 kg/m   Patient alert and oriented and in no cardiopulmonary distress.  HEENT: No facial asymmetry, EOMI,     Neck supple .  Chest: Clear to auscultation bilaterally.  CVS: S1, S2 no murmurs, no S3.Regular rate.  ABD: Soft non tender.   Ext: No edema  MS: Adequate ROM spine, shoulders, hips and knees.  Skin: Intact, no ulcerations or rash noted.  Psych: Good eye contact, normal affect. Memory intact not anxious or depressed appearing.  CNS: CN 2-12 intact, power,  normal throughout.no focal deficits noted.   Assessment & Plan  Essential hypertension Controlled, no change in medication DASH diet and commitment to daily  physical activity for a minimum of 30 minutes discussed and encouraged, as a part of hypertension management. The importance of attaining a healthy weight is also discussed.     02/13/2024    8:11 AM 12/04/2023    8:10 AM 09/18/2023    9:36 AM 09/12/2023    9:40 AM 09/12/2023    9:16 AM 09/04/2023    2:12 PM 08/05/2023    8:02 AM  BP/Weight  Systolic BP 105 118 147 138 142 143 137  Diastolic BP 74 78 89 86 88 82 97  Wt. (Lbs) 230 242.12 244  243.4 240.6 231  BMI 37.12 kg/m2 39.08 kg/m2 39.38 kg/m2  39.29 kg/m2 38.83 kg/m2 37.28 kg/m2       Migraine headache Controlled, no change in medication   Morbid obesity (HCC)  Patient re-educated about  the importance of commitment to a  minimum of 150 minutes of exercise per week as able.  The importance of healthy food choices with portion control discussed, as well as eating regularly and within a 12 hour window most days. The need to choose "clean , green" food 50 to 75% of the time is discussed, as well as to make water the primary drink and set a goal of 64 ounces water daily.       02/13/2024    8:11 AM 12/04/2023    8:10 AM 09/18/2023    9:36 AM  Weight /BMI  Weight 230 lb 242 lb 1.9 oz 244 lb  Height 5\' 6"  (  1.676 m) 5\' 6"  (1.676 m) 5\' 6"  (1.676 m)  BMI 37.12 kg/m2 39.08 kg/m2 39.38 kg/m2    improved  Seasonal allergies Controlled, no change in medication   Vitamin D deficiency Updated lab needed at/ before next visit. Pa

## 2024-02-13 NOTE — Assessment & Plan Note (Signed)
 Controlled, no change in medication DASH diet and commitment to daily physical activity for a minimum of 30 minutes discussed and encouraged, as a part of hypertension management. The importance of attaining a healthy weight is also discussed.     02/13/2024    8:11 AM 12/04/2023    8:10 AM 09/18/2023    9:36 AM 09/12/2023    9:40 AM 09/12/2023    9:16 AM 09/04/2023    2:12 PM 08/05/2023    8:02 AM  BP/Weight  Systolic BP 105 118 147 138 142 143 137  Diastolic BP 74 78 89 86 88 82 97  Wt. (Lbs) 230 242.12 244  243.4 240.6 231  BMI 37.12 kg/m2 39.08 kg/m2 39.38 kg/m2  39.29 kg/m2 38.83 kg/m2 37.28 kg/m2

## 2024-02-13 NOTE — Assessment & Plan Note (Signed)
  Patient re-educated about  the importance of commitment to a  minimum of 150 minutes of exercise per week as able.  The importance of healthy food choices with portion control discussed, as well as eating regularly and within a 12 hour window most days. The need to choose "clean , green" food 50 to 75% of the time is discussed, as well as to make water the primary drink and set a goal of 64 ounces water daily.       02/13/2024    8:11 AM 12/04/2023    8:10 AM 09/18/2023    9:36 AM  Weight /BMI  Weight 230 lb 242 lb 1.9 oz 244 lb  Height 5\' 6"  (1.676 m) 5\' 6"  (1.676 m) 5\' 6"  (1.676 m)  BMI 37.12 kg/m2 39.08 kg/m2 39.38 kg/m2    improved
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
## 2024-02-23 ENCOUNTER — Ambulatory Visit (HOSPITAL_COMMUNITY): Payer: Managed Care, Other (non HMO)

## 2024-02-27 ENCOUNTER — Telehealth (INDEPENDENT_AMBULATORY_CARE_PROVIDER_SITE_OTHER): Payer: 59 | Admitting: Psychiatry

## 2024-02-27 ENCOUNTER — Encounter: Payer: Self-pay | Admitting: Psychiatry

## 2024-02-27 DIAGNOSIS — F330 Major depressive disorder, recurrent, mild: Secondary | ICD-10-CM

## 2024-02-27 DIAGNOSIS — G4700 Insomnia, unspecified: Secondary | ICD-10-CM | POA: Diagnosis not present

## 2024-02-27 MED ORDER — CARIPRAZINE HCL 3 MG PO CAPS: 3.0000 mg | ORAL_CAPSULE | Freq: Every day | ORAL | 3 refills | Status: DC

## 2024-02-27 MED ORDER — BUSPIRONE HCL 7.5 MG PO TABS: 7.5000 mg | ORAL_TABLET | Freq: Two times a day (BID) | ORAL | 1 refills | Status: DC

## 2024-02-27 NOTE — Patient Instructions (Signed)
 Continue lexapro 20 mg daily Decrease Buspar 7.5 mg twice a day Continue Vraylar 3 mg daily  Continue Lunesta 3 mg nightly as needed for insomnia Continue hydroxyzine 25 mg daily as needed for anxiety Next appointment: 5/30 at 8 am

## 2024-03-18 ENCOUNTER — Other Ambulatory Visit: Payer: Self-pay | Admitting: Family Medicine

## 2024-03-20 ENCOUNTER — Other Ambulatory Visit: Payer: Self-pay | Admitting: Psychiatry

## 2024-03-22 ENCOUNTER — Ambulatory Visit: Payer: Managed Care, Other (non HMO) | Admitting: "Endocrinology

## 2024-03-31 ENCOUNTER — Other Ambulatory Visit: Payer: Self-pay | Admitting: Family Medicine

## 2024-04-05 ENCOUNTER — Telehealth: Payer: Self-pay | Admitting: "Endocrinology

## 2024-04-05 DIAGNOSIS — E0520 Thyrotoxicosis with toxic multinodular goiter without thyrotoxic crisis or storm: Secondary | ICD-10-CM

## 2024-04-05 NOTE — Telephone Encounter (Signed)
 Pt needs labs updated

## 2024-04-05 NOTE — Telephone Encounter (Signed)
 Labs updated and sent to [facility].
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
## 2024-04-15 ENCOUNTER — Encounter: Payer: Self-pay | Admitting: "Endocrinology

## 2024-04-15 ENCOUNTER — Ambulatory Visit: Admitting: "Endocrinology

## 2024-04-16 ENCOUNTER — Other Ambulatory Visit: Payer: Self-pay | Admitting: Family Medicine

## 2024-04-16 ENCOUNTER — Encounter: Payer: Self-pay | Admitting: Psychiatry

## 2024-04-16 ENCOUNTER — Telehealth: Admitting: Psychiatry

## 2024-04-16 DIAGNOSIS — G4700 Insomnia, unspecified: Secondary | ICD-10-CM

## 2024-04-16 DIAGNOSIS — I10 Essential (primary) hypertension: Secondary | ICD-10-CM

## 2024-04-16 DIAGNOSIS — F330 Major depressive disorder, recurrent, mild: Secondary | ICD-10-CM | POA: Diagnosis not present

## 2024-04-16 MED ORDER — BUSPIRONE HCL 7.5 MG PO TABS: 7.5000 mg | ORAL_TABLET | Freq: Two times a day (BID) | ORAL | 3 refills | Status: DC

## 2024-04-16 MED ORDER — ESZOPICLONE 3 MG PO TABS: 3.0000 mg | ORAL_TABLET | Freq: Every evening | ORAL | 0 refills | Status: DC | PRN

## 2024-04-16 NOTE — Patient Instructions (Signed)
 Continue lexapro  20 mg daily Continue Buspar  7.5 mg twice a day  Continue Vraylar  3 mg daily  Continue Lunesta  3 mg nightly as needed for insomnia Continue hydroxyzine  25 mg daily as needed for anxiety Next appointment: 7/2 at 8 20

## 2024-04-22 ENCOUNTER — Encounter: Payer: Self-pay | Admitting: Family Medicine

## 2024-04-22 ENCOUNTER — Encounter (INDEPENDENT_AMBULATORY_CARE_PROVIDER_SITE_OTHER): Payer: Self-pay | Admitting: *Deleted

## 2024-04-23 MED ORDER — CYCLOBENZAPRINE HCL 10 MG PO TABS: 10.0000 mg | ORAL_TABLET | Freq: Three times a day (TID) | ORAL | 2 refills | Status: DC | PRN

## 2024-05-03 ENCOUNTER — Other Ambulatory Visit: Payer: Self-pay | Admitting: Psychiatry

## 2024-05-05 ENCOUNTER — Other Ambulatory Visit (HOSPITAL_COMMUNITY)
Admission: RE | Admit: 2024-05-05 | Discharge: 2024-05-05 | Disposition: A | Discharge status: Home / Self Care | Source: Ambulatory Visit | Attending: "Endocrinology | Admitting: "Endocrinology

## 2024-05-05 DIAGNOSIS — E0520 Thyrotoxicosis with toxic multinodular goiter without thyrotoxic crisis or storm: Secondary | ICD-10-CM | POA: Diagnosis present

## 2024-05-05 LAB — TSH: TSH: 5.833 u[IU]/mL — ABNORMAL HIGH (ref 0.350–4.500)

## 2024-05-05 LAB — T4, FREE: Free T4: 0.84 ng/dL (ref 0.61–1.12)

## 2024-05-06 ENCOUNTER — Encounter (HOSPITAL_COMMUNITY): Payer: Self-pay

## 2024-05-06 ENCOUNTER — Ambulatory Visit (HOSPITAL_COMMUNITY)
Admission: RE | Admit: 2024-05-06 | Discharge: 2024-05-06 | Disposition: A | Discharge status: Home / Self Care | Source: Ambulatory Visit | Attending: Family Medicine | Admitting: Family Medicine

## 2024-05-06 DIAGNOSIS — Z1231 Encounter for screening mammogram for malignant neoplasm of breast: Secondary | ICD-10-CM

## 2024-05-06 LAB — T3, FREE: T3, Free: 2.9 pg/mL (ref 2.0–4.4)
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
## 2024-05-19 ENCOUNTER — Telehealth (INDEPENDENT_AMBULATORY_CARE_PROVIDER_SITE_OTHER): Admitting: Psychiatry

## 2024-05-19 ENCOUNTER — Encounter: Payer: Self-pay | Admitting: Psychiatry

## 2024-05-19 DIAGNOSIS — F330 Major depressive disorder, recurrent, mild: Secondary | ICD-10-CM | POA: Diagnosis not present

## 2024-05-19 DIAGNOSIS — G4700 Insomnia, unspecified: Secondary | ICD-10-CM | POA: Diagnosis not present

## 2024-05-19 MED ORDER — ESZOPICLONE 3 MG PO TABS: 3.0000 mg | ORAL_TABLET | Freq: Every evening | ORAL | 0 refills | Status: DC | PRN

## 2024-05-19 NOTE — Patient Instructions (Signed)
 Continue lexapro  20 mg daily Continue Buspar  7.5 mg twice a day Continue Vraylar  3 mg daily  Continue Lunesta  3 mg nightly as needed for insomnia Continue hydroxyzine  25 mg daily as needed for anxiety Next appointment: 8/7 at 8 am

## 2024-05-24 MED ORDER — PROMETHAZINE-DM 6.25-15 MG/5ML PO SYRP
ORAL_SOLUTION | ORAL | 0 refills | Status: DC
Start: 2024-05-24 — End: 2024-10-01

## 2024-05-24 NOTE — Addendum Note (Signed)
 Addended by: ANTONETTA ROLLENE BRAVO on: 05/24/2024 08:34 AM   Modules accepted: Orders

## 2024-05-26 ENCOUNTER — Encounter: Payer: Self-pay | Admitting: Family Medicine

## 2024-05-26 ENCOUNTER — Ambulatory Visit (INDEPENDENT_AMBULATORY_CARE_PROVIDER_SITE_OTHER): Admitting: Family Medicine

## 2024-05-26 VITALS — BP 118/86 | HR 83 | Resp 16 | Ht 66.0 in | Wt 226.1 lb

## 2024-05-26 DIAGNOSIS — Z23 Encounter for immunization: Secondary | ICD-10-CM | POA: Insufficient documentation

## 2024-05-26 DIAGNOSIS — E782 Mixed hyperlipidemia: Secondary | ICD-10-CM

## 2024-05-26 DIAGNOSIS — I10 Essential (primary) hypertension: Secondary | ICD-10-CM | POA: Diagnosis not present

## 2024-05-26 DIAGNOSIS — F419 Anxiety disorder, unspecified: Secondary | ICD-10-CM

## 2024-05-26 DIAGNOSIS — J309 Allergic rhinitis, unspecified: Secondary | ICD-10-CM | POA: Insufficient documentation

## 2024-05-26 DIAGNOSIS — F322 Major depressive disorder, single episode, severe without psychotic features: Secondary | ICD-10-CM

## 2024-05-26 MED ORDER — MONTELUKAST SODIUM 10 MG PO TABS: 10.0000 mg | ORAL_TABLET | Freq: Every day | ORAL | 1 refills | Status: AC

## 2024-05-26 MED ORDER — VITAMIN D (ERGOCALCIFEROL) 1.25 MG (50000 UNIT) PO CAPS: 50000.0000 [IU] | ORAL_CAPSULE | ORAL | 2 refills | Status: AC

## 2024-05-26 MED ORDER — PHENTERMINE HCL 15 MG PO CAPS: 15.0000 mg | ORAL_CAPSULE | ORAL | 1 refills | Status: DC

## 2024-05-26 NOTE — Assessment & Plan Note (Signed)
 Still very anxious, managed by Psych , improving

## 2024-05-26 NOTE — Assessment & Plan Note (Signed)
 Not taking medications regularly, start singulair 

## 2024-05-26 NOTE — Progress Notes (Signed)
   Olivia Woodward     MRN: [id number]      DOB: [birth year]  Chief Complaint  Patient presents with   Hypertension    14 week follow up     HPI Olivia Woodward is here for follow up and re-evaluation of chronic medical conditions, medication management and review of any available recent lab and radiology data.  Preventive health is updated, specifically  Cancer screening and Immunization.   Questions or concerns regarding consultations or procedures which the PT has had in the interim are  addressed. The PT denies any adverse reactions to current medications since the last visit. Doing very well with phentermine  and no adverse s/e C/o uncontrolled allergies , chronc drainager and bedtime coughs   ROS Denies recent fever or chills. Denies sinus pressure, nasal congestion, ear pain or sore throat. Denies chest congestion, productive cough or wheezing. Denies chest pains, palpitations and leg swelling Denies abdominal pain, nausea, vomiting,diarrhea or constipation.   Denies dysuria, frequency, hesitancy or incontinence. C/o bilateral knee pain and left shoulder pain. Denies headaches, seizures, numbness, or tingling. Denies depression, anxiety or insomnia. Denies skin break down or rash.   PE  BP 118/86   Pulse 83   Resp 16   Ht 5' 6 (1.676 m)   Wt 226 lb 1.3 oz (102.5 kg)   SpO2 96%   BMI 36.49 kg/m   Patient alert and oriented and in no cardiopulmonary distress.  HEENT: No facial asymmetry, EOMI,     Neck supple .  Chest: Clear to auscultation bilaterally.  CVS: S1, S2 no murmurs, no S3.Regular rate.  ABD: Soft non tender.   Ext: No edema  MS: Adequate ROM spine, shoulders, hips and knees.  Skin: Intact, no ulcerations or rash noted.  Psych: Good eye contact, normal affect. Memory intact not anxious or depressed appearing.  CNS: CN 2-12 intact, power,  normal throughout.no focal deficits noted.   Assessment & Plan  Immunization due After obtaining informed  consent, the hep B #1vaccine is  administered , with no adverse effect noted at the time of administration.   Morbid obesity (HCC) Great weight loss continuwe current dose phentermine , work on food choice and eating habits  Patient re-educated about  the importance of commitment to a  minimum of 150 minutes of exercise per week as able.  The importance of healthy food choices with portion control discussed, as well as eating regularly and within a 12 hour window most days. The need to choose clean , green food 50 to 75% of the time is discussed, as well as to make water  the primary drink and set a goal of 64 ounces water  daily.       05/26/2024    8:01 AM 02/13/2024    8:11 AM 12/04/2023    8:10 AM  Weight /BMI  Weight 226 lb 1.3 oz 230 lb 242 lb 1.9 oz  Height 5' 6 (1.676 m) 5' 6 (1.676 m) 5' 6 (1.676 m)  BMI 36.49 kg/m2 37.12 kg/m2 39.08 kg/m2   \  Depression, major, single episode, severe (HCC) Improved, managed by Psych  Anxiety Still very anxious, managed by Psych , improving  Allergic sinusitis Not taking medications regularly, start singulair

## 2024-05-26 NOTE — Assessment & Plan Note (Signed)
Improved, managed by Psych 

## 2024-05-26 NOTE — Assessment & Plan Note (Signed)
 Great weight loss continuwe current dose phentermine , work on food choice and eating habits  Patient re-educated about  the importance of commitment to a  minimum of 150 minutes of exercise per week as able.  The importance of healthy food choices with portion control discussed, as well as eating regularly and within a 12 hour window most days. The need to choose clean , green food 50 to 75% of the time is discussed, as well as to make water  the primary drink and set a goal of 64 ounces water  daily.       05/26/2024    8:01 AM 02/13/2024    8:11 AM 12/04/2023    8:10 AM  Weight /BMI  Weight 226 lb 1.3 oz 230 lb 242 lb 1.9 oz  Height 5' 6 (1.676 m) 5' 6 (1.676 m) 5' 6 (1.676 m)  BMI 36.49 kg/m2 37.12 kg/m2 39.08 kg/m2   \

## 2024-05-26 NOTE — Patient Instructions (Signed)
 F/U in 4 months, call if you need me sooner  Hep B #1 today  Nurse visit for Hep B#2 in 3 months, and in 6 months Hep B #3  Fasting lipid panel , BMP and EGFR  to be drawn same day as you are getting labs for Endocrinology  COnGRATS!!   Changes as discussed with food choice and eating habits  It is important that you exercise regularly at least 30 minutes 5 times a week. If you develop chest pain, have severe difficulty breathing, or feel very tired, stop exercising immediately and seek medical attention   New for allergies is montelukast   Thanks for choosing [facility], we consider it a privelige to serve you.

## 2024-05-26 NOTE — Assessment & Plan Note (Signed)
 After obtaining informed consent, the hep B #1vaccine is  administered , with no adverse effect noted at the time of administration.

## 2024-06-08 ENCOUNTER — Ambulatory Visit: Admitting: "Endocrinology

## 2024-06-19 NOTE — Progress Notes (Unsigned)
 Sent a video visit link through Epic, but the patient didn't sign in. Tried calling for today's appointment, but got no answer. Left a voicemail instructing the patient to contact the office at [phone].

## 2024-06-24 ENCOUNTER — Telehealth (INDEPENDENT_AMBULATORY_CARE_PROVIDER_SITE_OTHER): Payer: Self-pay | Admitting: Psychiatry

## 2024-06-24 DIAGNOSIS — Z91199 Patient's noncompliance with other medical treatment and regimen due to unspecified reason: Secondary | ICD-10-CM
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
## 2024-06-25 ENCOUNTER — Telehealth: Admitting: Psychiatry

## 2024-06-25 ENCOUNTER — Encounter: Payer: Self-pay | Admitting: Psychiatry

## 2024-06-25 DIAGNOSIS — F330 Major depressive disorder, recurrent, mild: Secondary | ICD-10-CM | POA: Diagnosis not present

## 2024-06-25 DIAGNOSIS — G4700 Insomnia, unspecified: Secondary | ICD-10-CM

## 2024-06-25 MED ORDER — CARIPRAZINE HCL 3 MG PO CAPS: 3.0000 mg | ORAL_CAPSULE | Freq: Every day | ORAL | 3 refills | Status: DC

## 2024-06-25 MED ORDER — ESZOPICLONE 3 MG PO TABS: 3.0000 mg | ORAL_TABLET | Freq: Every evening | ORAL | 1 refills | Status: DC | PRN

## 2024-06-25 MED ORDER — ESCITALOPRAM OXALATE 20 MG PO TABS: 20.0000 mg | ORAL_TABLET | Freq: Every day | ORAL | 5 refills | Status: DC

## 2024-07-01 ENCOUNTER — Other Ambulatory Visit: Payer: Self-pay | Admitting: Family Medicine

## 2024-07-09 ENCOUNTER — Encounter: Payer: Self-pay | Admitting: Radiology

## 2024-07-14 ENCOUNTER — Other Ambulatory Visit: Payer: Self-pay | Admitting: Family Medicine

## 2024-07-14 DIAGNOSIS — I10 Essential (primary) hypertension: Secondary | ICD-10-CM

## 2024-07-18 ENCOUNTER — Other Ambulatory Visit: Payer: Self-pay | Admitting: Family Medicine

## 2024-08-01 ENCOUNTER — Encounter: Payer: Self-pay | Admitting: Orthopaedic Surgery
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
## 2024-08-13 ENCOUNTER — Telehealth: Admitting: Psychiatry

## 2024-08-13 ENCOUNTER — Encounter: Payer: Self-pay | Admitting: Psychiatry

## 2024-08-13 DIAGNOSIS — G4700 Insomnia, unspecified: Secondary | ICD-10-CM

## 2024-08-13 DIAGNOSIS — F330 Major depressive disorder, recurrent, mild: Secondary | ICD-10-CM | POA: Diagnosis not present

## 2024-08-13 MED ORDER — ESZOPICLONE 3 MG PO TABS: 3.0000 mg | ORAL_TABLET | Freq: Every evening | ORAL | 1 refills | Status: DC | PRN

## 2024-08-13 MED ORDER — L-METHYLFOLATE 7.5 MG PO TABS: 7.5000 mg | ORAL_TABLET | Freq: Every day | ORAL | 1 refills | Status: AC

## 2024-08-13 MED ORDER — BUSPIRONE HCL 7.5 MG PO TABS: 7.5000 mg | ORAL_TABLET | Freq: Two times a day (BID) | ORAL | 5 refills | Status: AC

## 2024-08-13 MED ORDER — HYDROXYZINE HCL 25 MG PO TABS: 25.0000 mg | ORAL_TABLET | Freq: Every day | ORAL | 3 refills | Status: DC | PRN

## 2024-08-13 NOTE — Patient Instructions (Signed)
 Continue lexapro  20 mg daily Start L methyl folate 7.5 mg daily Continue Buspar  7.5 mg twice a day  Continue Vraylar  3 mg daily  Continue Lunesta  3 mg nightly as needed for insomnia Continue hydroxyzine  25 mg daily as needed for anxiety Next appointment: 12/5 at 8 40, video

## 2024-08-15 ENCOUNTER — Other Ambulatory Visit: Payer: Self-pay | Admitting: Family Medicine

## 2024-08-27 ENCOUNTER — Ambulatory Visit (INDEPENDENT_AMBULATORY_CARE_PROVIDER_SITE_OTHER)

## 2024-08-27 DIAGNOSIS — Z23 Encounter for immunization: Secondary | ICD-10-CM

## 2024-08-27 NOTE — Progress Notes (Signed)
 Patient is in office today for a nurse visit for Immunization. Patient Injection was given in the  Right deltoid. Patient tolerated injection well.

## 2024-09-02 ENCOUNTER — Ambulatory Visit: Payer: Self-pay

## 2024-09-02 NOTE — Telephone Encounter (Signed)
 FYI Only or Action Required?: FYI only for provider.  Patient was last seen in primary care on 05/26/2024 by Antonetta Rollene BRAVO, MD.  Called Nurse Triage reporting Cough.  Symptoms began several days ago.  Interventions attempted: OTC medications: Nyquil, Dayquil.  Symptoms are: gradually worsening.  Triage Disposition: See Physician Within 24 Hours  Patient/caregiver understands and will follow disposition?: Yes  Copied from CRM [id number]. Topic: Clinical - Medication Question >> Sep 02, 2024 10:31 AM Antwanette L wrote: Reason for CRM: On 10/10, the patient left a message with the office requesting that Dr. Tobie prescribe medication for her cough.The patient's primary provider, Dr. Antonetta, is currently out of the office until 11/3.The prescription can be sent to [facility], located at [address] [place], [place], [place] [address]. The patient can be contacted by phone at [phone] Reason for Disposition  [1] Continuous (nonstop) coughing interferes with work or school AND [2] no improvement using cough treatment per Care Advice  Answer Assessment - Initial Assessment Questions Pt states she has had dry cough but its so bad at night she gets no rest. States she thought it was allergies but no longer thinks that. Pt has been requesting meds from pcp office since 10/10 but has not heard back. RN explained they will want to evaluate sxs in person prior to writing rx. PCP no availability until late October, pt agreeable to being scheduled at different clinic for acute visit.   1. ONSET: When did the cough begin?      Thursday 2. SEVERITY: How bad is the cough today?      Worse at night, does not sleep. Intermittent throughout the day 3. SPUTUM: Describe the color of your sputum (e.g., none, dry cough; clear, white, yellow, green)     denies 4. HEMOPTYSIS: Are you coughing up any blood? If Yes, ask: How much? (e.g., flecks, streaks, tablespoons, etc.)     denies 5. DIFFICULTY  BREATHING: Are you having difficulty breathing? If Yes, ask: How bad is it? (e.g., mild, moderate, severe)      Catch breath after coughing fit.  6. FEVER: Do you have a fever? If Yes, ask: What is your temperature, how was it measured, and when did it start?     denies 7. CARDIAC HISTORY: Do you have any history of heart disease? (e.g., heart attack, congestive heart failure)      denies 8. LUNG HISTORY: Do you have any history of lung disease?  (e.g., pulmonary embolus, asthma, emphysema)     denies 9. PE RISK FACTORS: Do you have a history of blood clots? (or: recent major surgery, recent prolonged travel, bedridden)     denies 10. OTHER SYMPTOMS: Do you have any other symptoms? (e.g., runny nose, wheezing, chest pain)       R ear clogged up  12. TRAVEL: Have you traveled out of the country in the last month? (e.g., travel history, exposures)       denies  Protocols used: Cough - Acute Non-Productive-A-AH

## 2024-09-02 NOTE — Telephone Encounter (Signed)
 Appt made.

## 2024-09-03 ENCOUNTER — Ambulatory Visit: Admitting: Family Medicine

## 2024-09-20 ENCOUNTER — Encounter: Payer: Self-pay | Admitting: Radiology

## 2024-09-23 ENCOUNTER — Encounter (HOSPITAL_COMMUNITY): Payer: Self-pay | Admitting: Oncology

## 2024-09-30 ENCOUNTER — Ambulatory Visit: Payer: Self-pay | Admitting: Family Medicine

## 2024-09-30 ENCOUNTER — Encounter: Payer: Self-pay | Admitting: Family Medicine

## 2024-09-30 ENCOUNTER — Encounter (INDEPENDENT_AMBULATORY_CARE_PROVIDER_SITE_OTHER): Payer: Self-pay | Admitting: *Deleted

## 2024-09-30 VITALS — HR 88 | Resp 16 | Ht 67.0 in | Wt 232.0 lb

## 2024-09-30 DIAGNOSIS — Z131 Encounter for screening for diabetes mellitus: Secondary | ICD-10-CM

## 2024-09-30 DIAGNOSIS — F322 Major depressive disorder, single episode, severe without psychotic features: Secondary | ICD-10-CM

## 2024-09-30 DIAGNOSIS — I10 Essential (primary) hypertension: Secondary | ICD-10-CM

## 2024-09-30 DIAGNOSIS — D126 Benign neoplasm of colon, unspecified: Secondary | ICD-10-CM | POA: Diagnosis not present

## 2024-09-30 DIAGNOSIS — R053 Chronic cough: Secondary | ICD-10-CM

## 2024-09-30 DIAGNOSIS — F419 Anxiety disorder, unspecified: Secondary | ICD-10-CM

## 2024-09-30 DIAGNOSIS — Z23 Encounter for immunization: Secondary | ICD-10-CM

## 2024-09-30 DIAGNOSIS — E782 Mixed hyperlipidemia: Secondary | ICD-10-CM | POA: Diagnosis not present

## 2024-09-30 DIAGNOSIS — N1831 Chronic kidney disease, stage 3a: Secondary | ICD-10-CM

## 2024-09-30 DIAGNOSIS — R7303 Prediabetes: Secondary | ICD-10-CM

## 2024-09-30 NOTE — Patient Instructions (Addendum)
 F/U in 4 months  Flu vaccine today  Pls get covid vaccine in 1 to 2 weeks  HBA1C, lipid, cmp and eGFr today   You will be referred to Bariatric surgery  PLS work on food choice and try  counting calories   All the best with job search, do nOT stop!   It is important that you exercise regularly at least 30 minutes 5 times a week. If you develop chest pain, have severe difficulty breathing, or feel very tired, stop exercising immediately and seek medical attention   Phentermine  and cough syrup will be refilled  You need colonoscopy  Thanks for choosing [facility], we consider it a privelige to serve you.

## 2024-10-01 LAB — CMP14+EGFR
ALT: 7 IU/L (ref 0–32)
AST: 18 IU/L (ref 0–40)
Albumin: 3.9 g/dL (ref 3.8–4.9)
Alkaline Phosphatase: 39 IU/L — ABNORMAL LOW (ref 49–135)
BUN/Creatinine Ratio: 10 (ref 9–23)
BUN: 14 mg/dL (ref 6–24)
Bilirubin Total: 0.5 mg/dL (ref 0.0–1.2)
CO2: 21 mmol/L (ref 20–29)
Calcium: 9.3 mg/dL (ref 8.7–10.2)
Chloride: 104 mmol/L (ref 96–106)
Creatinine, Ser: 1.34 mg/dL — ABNORMAL HIGH (ref 0.57–1.00)
Globulin, Total: 2.6 g/dL (ref 1.5–4.5)
Glucose: 79 mg/dL (ref 70–99)
Potassium: 4.3 mmol/L (ref 3.5–5.2)
Sodium: 140 mmol/L (ref 134–144)
Total Protein: 6.5 g/dL (ref 6.0–8.5)
eGFR: 47 mL/min/1.73 — ABNORMAL LOW (ref >59)

## 2024-10-01 LAB — HEMOGLOBIN A1C
Est. average glucose Bld gHb Est-mCnc: 114 mg/dL
Hgb A1c MFr Bld: 5.6 % (ref 4.8–5.6)

## 2024-10-01 LAB — LIPID PANEL
Chol/HDL Ratio: 2.8 ratio (ref 0.0–4.4)
Cholesterol, Total: 208 mg/dL — ABNORMAL HIGH (ref 100–199)
HDL: 74 mg/dL (ref >39)
LDL Chol Calc (NIH): 125 mg/dL — ABNORMAL HIGH (ref 0–99)
Triglycerides: 49 mg/dL (ref 0–149)
VLDL Cholesterol Cal: 9 mg/dL (ref 5–40)

## 2024-10-01 MED ORDER — PROMETHAZINE-DM 6.25-15 MG/5ML PO SYRP
ORAL_SOLUTION | ORAL | 0 refills | Status: AC
Start: 2024-10-01 — End: ?

## 2024-10-03 ENCOUNTER — Ambulatory Visit: Payer: Self-pay | Admitting: Family Medicine

## 2024-10-03 DIAGNOSIS — R7303 Prediabetes: Secondary | ICD-10-CM | POA: Insufficient documentation

## 2024-10-03 DIAGNOSIS — Z23 Encounter for immunization: Secondary | ICD-10-CM | POA: Insufficient documentation

## 2024-10-03 DIAGNOSIS — R053 Chronic cough: Secondary | ICD-10-CM | POA: Insufficient documentation

## 2024-10-03 DIAGNOSIS — N183 Chronic kidney disease, stage 3 unspecified: Secondary | ICD-10-CM | POA: Insufficient documentation

## 2024-10-03 MED ORDER — PHENTERMINE HCL 15 MG PO CAPS
15.0000 mg | ORAL_CAPSULE | ORAL | 0 refills | Status: AC
Start: 2024-11-02 — End: 2025-01-01

## 2024-10-03 NOTE — Assessment & Plan Note (Signed)
 After obtaining informed consent, the vaccine is  administered , with no adverse effect noted at the time of administration.

## 2024-10-03 NOTE — Assessment & Plan Note (Signed)
 Hyperlipidemia:Low fat diet discussed and encouraged.   Lipid Panel  Lab Results  Component Value Date   CHOL 208 (H) 09/30/2024   HDL 74 09/30/2024   LDLCALC 125 (H) 09/30/2024   TRIG 49 09/30/2024   CHOLHDL 2.8 09/30/2024     Needs to reduce fried and fatty foods

## 2024-10-03 NOTE — Progress Notes (Signed)
 Olivia Woodward     MRN: [id number]      DOB: Apr 11, 1966  Chief Complaint  Patient presents with   Hypertension    4 month follow up     HPI Olivia Woodward is here for follow up and re-evaluation of chronic medical conditions, medication management and review of any available recent lab and radiology data.  Preventive health is updated, specifically  Cancer screening and Immunization.   Interested in rept bariatric surgery as weight loss is not occurring despite her best efforts   Stressed as seeking new employment, states needs more money   ROS Denies recent fever or chills. Denies sinus pressure, nasal congestion, ear pain or sore throat. Denies chest congestion, productive cough or wheezing. Denies chest pains, palpitations and leg swelling Denies abdominal pain, nausea, vomiting,diarrhea or constipation.   Denies dysuria, frequency, hesitancy or incontinence. Denies joint pain, swelling and limitation in mobility. Denies headaches, seizures, numbness, or tingling. Chronic depression, anxiety or insomnia. Denies skin break down or rash.   PE  Pulse 88   Resp 16   Ht 5' 7 (1.702 m)   Wt 232 lb 0.6 oz (105.3 kg)   SpO2 93%   BMI 36.34 kg/m   Patient alert and oriented and in no cardiopulmonary distress.  HEENT: No facial asymmetry, EOMI,     Neck supple .  Chest: Clear to auscultation bilaterally.  CVS: S1, S2 no murmurs, no S3.Regular rate.  ABD: Soft non tender.   Ext: No edema  MS: Adequate ROM spine, shoulders, hips and knees.  Skin: Intact, no ulcerations or rash noted.  Psych: Good eye contact, normal affect. Memory intact not anxious or depressed appearing.  CNS: CN 2-12 intact, power,  normal throughout.no focal deficits noted.   Assessment & Plan  Morbid obesity [facility])  Patient re-educated about  the importance of commitment to a  minimum of 150 minutes of exercise per week as able.  The importance of healthy food choices with portion control  discussed, as well as eating regularly and within a 12 hour window most days. The need to choose clean , green food 50 to 75% of the time is discussed, as well as to make water  the primary drink and set a goal of 64 ounces water  daily.       09/30/2024    8:01 AM 05/26/2024    8:01 AM 02/13/2024    8:11 AM  Weight /BMI  Weight 232 lb 0.6 oz 226 lb 1.3 oz 230 lb  Height 5' 7 (1.702 m) 5' 6 (1.676 m) 5' 6 (1.676 m)  BMI 36.34 kg/m2 36.49 kg/m2 37.12 kg/m2    Deteriorated, refer bariatric surgery  Essential hypertension, benign Controlled, no change in medication DASH diet and commitment to daily physical activity for a minimum of 30 minutes discussed and encouraged, as a part of hypertension management. The importance of attaining a healthy weight is also discussed.     09/30/2024    8:01 AM 05/26/2024    8:25 AM 05/26/2024    8:01 AM 02/13/2024    8:11 AM 12/04/2023    8:10 AM 09/18/2023    9:36 AM 09/12/2023    9:40 AM  BP/Weight  Systolic BP  118 866 105 118 147 138  Diastolic BP  86 86 74 78 89 86  Wt. (Lbs) 232.04  226.08 230 242.12 244   BMI 36.34 kg/m2  36.49 kg/m2 37.12 kg/m2 39.08 kg/m2 39.38 kg/m2  Tubular adenoma of colon Rept colonoscopy past due , she is aware and referral is entered  Mixed hyperlipidemia Hyperlipidemia:Low fat diet discussed and encouraged.   Lipid Panel  Lab Results  Component Value Date   CHOL 208 (H) 09/30/2024   HDL 74 09/30/2024   LDLCALC 125 (H) 09/30/2024   TRIG 49 09/30/2024   CHOLHDL 2.8 09/30/2024     Needs to reduce fried and fatty foods  Prediabetes Patient educated about the importance of limiting  Carbohydrate intake , the need to commit to daily physical activity for a minimum of 30 minutes , and to commit weight loss. The fact that changes in all these areas will reduce or eliminate all together the development of diabetes is stressed.      Latest Ref Rng & Units 09/30/2024    8:41 AM 12/04/2023    8:56  AM 09/11/2023    8:41 AM 07/02/2023    9:03 AM 12/05/2022    8:49 AM  Diabetic Labs  HbA1c 4.8 - 5.6 % 5.6  5.7   5.7    Chol 100 - 199 mg/dL 791   808  814    HDL >60 mg/dL 74   74  74    Calc LDL 0 - 99 mg/dL 874   887  896    Triglycerides 0 - 149 mg/dL 49   27  42    Creatinine 0.57 - 1.00 mg/dL 8.65  8.74   8.65  8.50       09/30/2024    8:01 AM 05/26/2024    8:25 AM 05/26/2024    8:01 AM 02/13/2024    8:11 AM 12/04/2023    8:10 AM 09/18/2023    9:36 AM 09/12/2023    9:40 AM  BP/Weight  Systolic BP  118 866 105 118 147 138  Diastolic BP  86 86 74 78 89 86  Wt. (Lbs) 232.04  226.08 230 242.12 244   BMI 36.34 kg/m2  36.49 kg/m2 37.12 kg/m2 39.08 kg/m2 39.38 kg/m2        No data to display          Updated lab needed at/ before next visit.   Influenza vaccination administered at current visit After obtaining informed consent, the vaccine is  administered , with no adverse effect noted at the time of administration.   Depression, major, single episode, severe (HCC) Managed by Psych improved but not yet totally controlled  Anxiety Increased and uncontrolled, stressed financially and seeking new employment  CKD (chronic kidney disease) stage 3, GFR 30-59 ml/min (HCC) Refer nephrology  Chronic cough Pheneregan DM as needed, refilled

## 2024-10-03 NOTE — Assessment & Plan Note (Addendum)
 Pheneregan DM as needed, refilled

## 2024-10-03 NOTE — Assessment & Plan Note (Signed)
 Increased and uncontrolled, stressed financially and seeking new employment

## 2024-10-03 NOTE — Assessment & Plan Note (Signed)
Refer nephrology

## 2024-10-03 NOTE — Assessment & Plan Note (Signed)
 Managed by Psych improved but not yet totally controlled

## 2024-10-03 NOTE — Assessment & Plan Note (Signed)
  Patient re-educated about  the importance of commitment to a  minimum of 150 minutes of exercise per week as able.  The importance of healthy food choices with portion control discussed, as well as eating regularly and within a 12 hour window most days. The need to choose clean , green food 50 to 75% of the time is discussed, as well as to make water  the primary drink and set a goal of 64 ounces water  daily.       09/30/2024    8:01 AM 05/26/2024    8:01 AM 02/13/2024    8:11 AM  Weight /BMI  Weight 232 lb 0.6 oz 226 lb 1.3 oz 230 lb  Height 5' 7 (1.702 m) 5' 6 (1.676 m) 5' 6 (1.676 m)  BMI 36.34 kg/m2 36.49 kg/m2 37.12 kg/m2    Deteriorated, refer bariatric surgery

## 2024-10-03 NOTE — Assessment & Plan Note (Signed)
 Patient educated about the importance of limiting  Carbohydrate intake , the need to commit to daily physical activity for a minimum of 30 minutes , and to commit weight loss. The fact that changes in all these areas will reduce or eliminate all together the development of diabetes is stressed.      Latest Ref Rng & Units 09/30/2024    8:41 AM 12/04/2023    8:56 AM 09/11/2023    8:41 AM 07/02/2023    9:03 AM 12/05/2022    8:49 AM  Diabetic Labs  HbA1c 4.8 - 5.6 % 5.6  5.7   5.7    Chol 100 - 199 mg/dL 791   808  814    HDL >60 mg/dL 74   74  74    Calc LDL 0 - 99 mg/dL 874   887  896    Triglycerides 0 - 149 mg/dL 49   27  42    Creatinine 0.57 - 1.00 mg/dL 8.65  8.74   8.65  8.50       09/30/2024    8:01 AM 05/26/2024    8:25 AM 05/26/2024    8:01 AM 02/13/2024    8:11 AM 12/04/2023    8:10 AM 09/18/2023    9:36 AM 09/12/2023    9:40 AM  BP/Weight  Systolic BP  118 866 105 118 147 138  Diastolic BP  86 86 74 78 89 86  Wt. (Lbs) 232.04  226.08 230 242.12 244   BMI 36.34 kg/m2  36.49 kg/m2 37.12 kg/m2 39.08 kg/m2 39.38 kg/m2        No data to display          Updated lab needed at/ before next visit.

## 2024-10-03 NOTE — Assessment & Plan Note (Signed)
 Rept colonoscopy past due , she is aware and referral is entered

## 2024-10-03 NOTE — Assessment & Plan Note (Signed)
 Controlled, no change in medication DASH diet and commitment to daily physical activity for a minimum of 30 minutes discussed and encouraged, as a part of hypertension management. The importance of attaining a healthy weight is also discussed.     09/30/2024    8:01 AM 05/26/2024    8:25 AM 05/26/2024    8:01 AM 02/13/2024    8:11 AM 12/04/2023    8:10 AM 09/18/2023    9:36 AM 09/12/2023    9:40 AM  BP/Weight  Systolic BP  118 866 105 118 147 138  Diastolic BP  86 86 74 78 89 86  Wt. (Lbs) 232.04  226.08 230 242.12 244   BMI 36.34 kg/m2  36.49 kg/m2 37.12 kg/m2 39.08 kg/m2 39.38 kg/m2

## 2024-10-08 ENCOUNTER — Other Ambulatory Visit: Payer: Self-pay | Admitting: Family Medicine

## 2024-10-08 DIAGNOSIS — I10 Essential (primary) hypertension: Secondary | ICD-10-CM
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
## 2024-10-21 ENCOUNTER — Other Ambulatory Visit: Payer: Self-pay | Admitting: Family Medicine

## 2024-10-21 ENCOUNTER — Other Ambulatory Visit: Payer: Self-pay | Admitting: Psychiatry

## 2024-10-21 MED ORDER — PROMETHAZINE HCL 25 MG PO TABS: ORAL_TABLET | ORAL | 3 refills | Status: AC

## 2024-10-21 MED ORDER — CYCLOBENZAPRINE HCL 10 MG PO TABS: ORAL_TABLET | ORAL | 3 refills | Status: AC

## 2024-10-22 ENCOUNTER — Telehealth: Admitting: Psychiatry

## 2024-10-22 ENCOUNTER — Encounter (HOSPITAL_COMMUNITY): Payer: Self-pay | Admitting: Oncology

## 2024-10-22 ENCOUNTER — Encounter: Payer: Self-pay | Admitting: Psychiatry

## 2024-10-22 DIAGNOSIS — F330 Major depressive disorder, recurrent, mild: Secondary | ICD-10-CM

## 2024-10-22 DIAGNOSIS — G4700 Insomnia, unspecified: Secondary | ICD-10-CM

## 2024-10-22 MED ORDER — CARIPRAZINE HCL 3 MG PO CAPS: 3.0000 mg | ORAL_CAPSULE | Freq: Every day | ORAL | 3 refills | Status: AC

## 2024-10-22 MED ORDER — L-METHYLFOLATE 15 MG PO TABS: 15.0000 mg | ORAL_TABLET | Freq: Every day | ORAL | 1 refills | Status: DC

## 2024-10-22 NOTE — Patient Instructions (Signed)
 Continue lexapro  20 mg daily Increase L methyl folate 15 mg daily Continue Buspar  7.5 mg twice a day  Continue Vraylar  3 mg daily  Continue Lunesta  3 mg nightly as needed for insomnia Continue hydroxyzine  25 mg daily as needed for anxiety Next appointment: 1/14 at 8:20

## 2024-11-01 ENCOUNTER — Other Ambulatory Visit (HOSPITAL_COMMUNITY): Payer: Self-pay | Admitting: Nephrology

## 2024-11-01 ENCOUNTER — Other Ambulatory Visit (HOSPITAL_COMMUNITY)
Admission: RE | Admit: 2024-11-01 | Discharge: 2024-11-01 | Disposition: A | Source: Ambulatory Visit | Attending: Nephrology | Admitting: Nephrology

## 2024-11-01 DIAGNOSIS — R8290 Unspecified abnormal findings in urine: Secondary | ICD-10-CM | POA: Diagnosis present

## 2024-11-01 DIAGNOSIS — N1831 Chronic kidney disease, stage 3a: Secondary | ICD-10-CM | POA: Diagnosis not present

## 2024-11-01 DIAGNOSIS — I129 Hypertensive chronic kidney disease with stage 1 through stage 4 chronic kidney disease, or unspecified chronic kidney disease: Secondary | ICD-10-CM | POA: Diagnosis not present

## 2024-11-01 DIAGNOSIS — E785 Hyperlipidemia, unspecified: Secondary | ICD-10-CM | POA: Diagnosis not present

## 2024-11-01 LAB — CBC WITH DIFFERENTIAL/PLATELET
Abs Immature Granulocytes: 0.01 K/uL (ref 0.00–0.07)
Basophils Absolute: 0 K/uL (ref 0.0–0.1)
Basophils Relative: 1 %
Eosinophils Absolute: 0.1 K/uL (ref 0.0–0.5)
Eosinophils Relative: 2 %
HCT: 35.7 % — ABNORMAL LOW (ref 36.0–46.0)
Hemoglobin: 10.5 g/dL — ABNORMAL LOW (ref 12.0–15.0)
Immature Granulocytes: 0 %
Lymphocytes Relative: 37 %
Lymphs Abs: 1.8 K/uL (ref 0.7–4.0)
MCH: 27.7 pg (ref 26.0–34.0)
MCHC: 29.4 g/dL — ABNORMAL LOW (ref 30.0–36.0)
MCV: 94.2 fL (ref 80.0–100.0)
Monocytes Absolute: 0.4 K/uL (ref 0.1–1.0)
Monocytes Relative: 7 %
Neutro Abs: 2.6 K/uL (ref 1.7–7.7)
Neutrophils Relative %: 53 %
Platelets: 262 K/uL (ref 150–400)
RBC: 3.79 MIL/uL — ABNORMAL LOW (ref 3.87–5.11)
RDW: 14.1 % (ref 11.5–15.5)
WBC: 4.9 K/uL (ref 4.0–10.5)
nRBC: 0 % (ref 0.0–0.2)

## 2024-11-01 LAB — RENAL FUNCTION PANEL
Albumin: 4.3 g/dL (ref 3.5–5.0)
Anion gap: 12 (ref 5–15)
BUN: 13 mg/dL (ref 6–20)
CO2: 22 mmol/L (ref 22–32)
Calcium: 9.5 mg/dL (ref 8.9–10.3)
Chloride: 104 mmol/L (ref 98–111)
Creatinine, Ser: 1.14 mg/dL — ABNORMAL HIGH (ref 0.44–1.00)
GFR, Estimated: 56 mL/min — ABNORMAL LOW (ref >60)
Glucose, Bld: 94 mg/dL (ref 70–99)
Phosphorus: 2.6 mg/dL (ref 2.5–4.6)
Potassium: 4.1 mmol/L (ref 3.5–5.1)
Sodium: 137 mmol/L (ref 135–145)

## 2024-11-01 LAB — HEPATITIS B SURFACE ANTIGEN: Hepatitis B Surface Ag: NONREACTIVE

## 2024-11-01 LAB — MAGNESIUM: Magnesium: 2.1 mg/dL (ref 1.7–2.4)

## 2024-11-01 LAB — C-REACTIVE PROTEIN: CRP: <0.5 mg/dL (ref <1.0)

## 2024-11-01 LAB — HEPATITIS C ANTIBODY: HCV Ab: NONREACTIVE

## 2024-11-02 LAB — KAPPA/LAMBDA LIGHT CHAINS
Kappa free light chain: 38.7 mg/L — ABNORMAL HIGH (ref 3.3–19.4)
Kappa, lambda light chain ratio: 1.47 (ref 0.26–1.65)
Lambda free light chains: 26.3 mg/L (ref 5.7–26.3)

## 2024-11-02 LAB — HEPATITIS B CORE ANTIBODY, TOTAL: HEP B CORE AB: NEGATIVE

## 2024-11-02 LAB — PARATHYROID HORMONE, INTACT (NO CA): PTH: 34 pg/mL (ref 15–65)

## 2024-11-08 ENCOUNTER — Encounter (HOSPITAL_COMMUNITY): Payer: Self-pay

## 2024-11-08 ENCOUNTER — Ambulatory Visit (HOSPITAL_COMMUNITY): Attending: Nephrology

## 2024-11-18 ENCOUNTER — Other Ambulatory Visit: Payer: Self-pay | Admitting: Psychiatry

## 2024-11-24 ENCOUNTER — Ambulatory Visit (HOSPITAL_COMMUNITY)

## 2024-11-24 ENCOUNTER — Ambulatory Visit (HOSPITAL_COMMUNITY)
Admission: RE | Admit: 2024-11-24 | Discharge: 2024-11-24 | Disposition: A | Discharge status: Home / Self Care | Source: Ambulatory Visit | Attending: Nephrology | Admitting: Nephrology

## 2024-11-24 DIAGNOSIS — N1831 Chronic kidney disease, stage 3a: Secondary | ICD-10-CM | POA: Diagnosis present

## 2024-11-24 DIAGNOSIS — I129 Hypertensive chronic kidney disease with stage 1 through stage 4 chronic kidney disease, or unspecified chronic kidney disease: Secondary | ICD-10-CM | POA: Insufficient documentation

## 2024-11-24 DIAGNOSIS — R8290 Unspecified abnormal findings in urine: Secondary | ICD-10-CM | POA: Insufficient documentation

## 2024-11-25 ENCOUNTER — Ambulatory Visit (HOSPITAL_COMMUNITY)

## 2024-11-26 ENCOUNTER — Ambulatory Visit: Payer: Self-pay | Admitting: Family Medicine
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
## 2024-11-29 NOTE — Progress Notes (Signed)
 [facility] Follow Up Visit   Patient Name: Olivia Woodward, female   Patient DOB: November 26, 1965 Date of Service: 11/29/2024  Patient MRN: [id number] Provider Creating Note: Woodward Brought, MD  [phone] Primary Care Physician: No primary care provider on file.   [address] [place] [address] Additional Physicians/ Providers:   Impression/Recommendations   [name] is a 59 y.o. female with hypothyroidism, hypertension, GERD, hyperlipidemia and history of pulmonary embolism who presents as a new patient for evaluation of chronic kidney disease stage IIIA. Creatinine 1.14, GFR of 56.    Hypertension with Chronic Kidney Disease stage IIIA : kidney function stable since 2023. Secondary to acute kidney injury with limited recovery, solitary kidney and hypertension. No documented history of diabetes. No NSAIDs. No proteinuria.  - pending test results - genetic testing pending - avoid nonsteroidal anti-inflammatory agents.  - Continue spironolactone .  - not currently on an ACE-I/ARB. No documentation of angioedema. Start losartan 25mg  daily.  - not currently on an SGLT-2 inhibitor   Hypertension with chronic kidney disease: 160/90. Does not check her blood pressure at home.  - current regimen of amlodipine  and spironolactone .  - start losartan 25mg  daily.    Hyperlipidemia: not currently on a statin  Patient Active Problem List  Diagnosis   Chronic kidney disease stage 3A (HCC)   Hypertensive chronic kidney disease, benign, with chronic kidney disease stage I through stage IV, or unspecified   Hyperlipidemia    Orders Placed This Encounter   Renal function panel   Urine Protein / creatinine ratio   Urinalysis with microscopic   losartan (Cozaar) 25 MG tablet       Return in about 4 weeks (around 12/27/2024).  Chief Complaint   Chief Complaint  Patient presents with   Follow-up    History of Present Illness   [name] presents for follow up. Patient presents by herself. Patient states she is in her usual state of health. Patient has no complaints. She states she is taking all her medications as prescribed. Reports no changes to her medications. Reports no use of nonsteroidal anti-inflammatory agents. No recent hospitalizations.   Patient is asking about her test results. She states she was not contacted by genetic testing company. Her renal ultrasound report is pending. It was done on 11/25/23.   Medications   Current Outpatient Medications:    amLODIPine  (NORVASC ) 10 MG tablet, Take 10 mg by mouth 1 (one) time each day, Disp: , Rfl:    busPIRone  (BUSPAR ) 7.5 MG tablet, Take 7.5 mg by mouth in the morning and 7.5 mg in the evening., Disp: , Rfl:    Cariprazine  HCl 3 MG capsule, Take 1 capsule (3 mg total) by mouth daily., Disp: , Rfl:    cyanocobalamin  (VITAMIN B-12) 2500 MCG tablet, Take 1 tablet by mouth in the morning., Disp: , Rfl:    cyclobenzaprine  (FLEXERIL ) 10 MG tablet, Take one tablet by mouth three times per week , as needed, for muscle spasm, Disp: , Rfl:    ergocalciferol  1.25 MG (50000 UT) capsule, Take 50,000 Units by mouth every 7 (seven) days, Disp: , Rfl:    escitalopram  (LEXAPRO ) 20 MG tablet, Take 20 mg by mouth 1 (one) time each day, Disp: , Rfl:    hydrOXYzine  (ATARAX ) 25 MG tablet, Take 1 tablet (25 mg total) by mouth daily as needed for anxiety., Disp: , Rfl:    losartan (Cozaar) 25 MG tablet, Take 1  tablet (25 mg total) by mouth 1 (one) time each day, Disp: 30 tablet, Rfl: 11   pantoprazole  (PROTONIX ) 40 MG EC tablet, Take 40 mg by mouth 1 (one) time each day before breakfast, Disp: , Rfl:    phentermine  15 MG capsule, Take 1 capsule (15 mg total) by mouth every morning., Disp: , Rfl:    promethazine -dextromethorphan  (PROMETHAZINE -DM) 6.25-15 MG/5ML syrup, One teaspoon twice daily as needed, for excessive cough, Disp: , Rfl:    spironolactone  (ALDACTONE ) 100 MG  tablet, Take 100 mg by mouth 1 (one) time each day, Disp: , Rfl:    Allergies Ketorolac and Ketorolac tromethamine  History Past Medical History:  Diagnosis Date   Anxiety    Chronic kidney disease stage 3A (HCC)    Gastroesophageal reflux disease    Headache    Hemorrhoid    Hyperlipidemia    Hypertensive chronic kidney disease, benign, with chronic kidney disease stage I through stage IV, or unspecified    Hypothyroidism    Major depressive disorder    Personal history of thyroid  cancer    Pulmonary embolism (HCC)    Seasonal allergy     Past Surgical History:  Procedure Laterality Date   BREAST SURGERY     CHOLECYSTECTOMY     GASTRIC BYPASS     HYSTERECTOMY     NEPHRECTOMY Left 09/30/2022   Family History  Problem Relation Age of Onset   Hypertension Mother    Cancer Mother    Hypertension Father    Cancer Father    Gout Sister    Hypertension Sister    Kidney disease Sister    Hypertension Sister    Social History   Tobacco Use   Smoking status: Never   Smokeless tobacco: Never  Substance Use Topics   Alcohol use: Never     Physical Exam  Vitals BP 160/90 (BP Location: Right upper arm, Patient Position: Sitting)   Pulse 98   Temp 98.1 F   Wt 237 lb 3.2 oz (108 kg)   SpO2 100%   BMI 37.15 kg/m   Vitals reviewed. Constitutional: She is oriented to person, place, and time. She appears well-developed.  HEENT:  Head: Normocephalic and atraumatic. Mouth/Throat: Oropharynx is clear and moist.  Eyes: Pupils are equal, round, and reactive to light.  Neck: Neck supple.  Cardiovascular:  Normal rate and regular rhythm.           Pulmonary/Chest: Effort normal and breath sounds normal.  Abdominal: Soft.  Neurological: She is alert and oriented to person, place, and time.  Skin: Skin is warm and dry.     Laboratory Studies  Chemistry  Lab Units 11/01/24 1026  ALBUMIN  30 mg/L        No lab exists for component:  IRON SATURATION, TRANSSATPER      Urine  Lab Units 11/01/24 1026  COLOR UA  Yellow  CLARITY UA  Clear  KETONES UA  Trace  PH UA  5.5  UROBILINOGEN UA  2.0        No lab exists for component: CYCLOSPORITR     Woodward Brought, MD  [address], [place]

## 2024-12-01 ENCOUNTER — Encounter: Payer: Self-pay | Admitting: Psychiatry

## 2024-12-01 ENCOUNTER — Telehealth (INDEPENDENT_AMBULATORY_CARE_PROVIDER_SITE_OTHER): Admitting: Psychiatry

## 2024-12-01 DIAGNOSIS — G4700 Insomnia, unspecified: Secondary | ICD-10-CM | POA: Diagnosis not present

## 2024-12-01 DIAGNOSIS — F330 Major depressive disorder, recurrent, mild: Secondary | ICD-10-CM | POA: Diagnosis not present

## 2024-12-01 MED ORDER — ESCITALOPRAM OXALATE 20 MG PO TABS: 20.0000 mg | ORAL_TABLET | Freq: Every day | ORAL | 5 refills | Status: AC

## 2024-12-01 MED ORDER — L-METHYLFOLATE 15 MG PO TABS: 15.0000 mg | ORAL_TABLET | Freq: Every day | ORAL | 5 refills | Status: AC

## 2024-12-01 MED ORDER — ESZOPICLONE 3 MG PO TABS: 3.0000 mg | ORAL_TABLET | Freq: Every evening | ORAL | 0 refills | Status: AC | PRN

## 2024-12-01 MED ORDER — HYDROXYZINE HCL 25 MG PO TABS: 25.0000 mg | ORAL_TABLET | Freq: Every day | ORAL | 3 refills | Status: AC | PRN

## 2024-12-01 NOTE — Patient Instructions (Signed)
 Continue lexapro  20 mg daily Continue L methyl folate 15 mg daily Continue Buspar  7.5 mg twice a day  Continue Vraylar  3 mg daily  Continue Lunesta  3 mg nightly as needed for insomnia Continue hydroxyzine  25 mg daily as needed for anxiety Next appointment: 2/25 at 9 am,

## 2024-12-28 ENCOUNTER — Ambulatory Visit

## 2025-01-10 ENCOUNTER — Ambulatory Visit: Admitting: Urology

## 2025-01-12 ENCOUNTER — Telehealth: Admitting: Psychiatry

## 2025-01-28 ENCOUNTER — Ambulatory Visit: Admitting: Family Medicine
